# Patient Record
Sex: Female | Born: 1955 | Race: White | Hispanic: No | Marital: Single
Health system: Midwestern US, Community
[De-identification: ages and names within clinical notes are randomized; demographics above are authoritative.]

## PROBLEM LIST (undated history)

## (undated) DIAGNOSIS — K760 Fatty (change of) liver, not elsewhere classified: Secondary | ICD-10-CM

## (undated) DIAGNOSIS — F329 Major depressive disorder, single episode, unspecified: Secondary | ICD-10-CM

## (undated) DIAGNOSIS — F1010 Alcohol abuse, uncomplicated: Secondary | ICD-10-CM

## (undated) DIAGNOSIS — R519 Headache, unspecified: Secondary | ICD-10-CM

## (undated) DIAGNOSIS — F32A Depression, unspecified: Secondary | ICD-10-CM

## (undated) DIAGNOSIS — K047 Periapical abscess without sinus: Secondary | ICD-10-CM

## (undated) HISTORY — PX: TUBAL LIGATION: SHX77

## (undated) HISTORY — PX: DENTAL SURGERY: SHX609

---

## 2006-07-07 ENCOUNTER — Inpatient Hospital Stay (HOSPITAL_COMMUNITY): Admission: EM | Admit: 2006-07-07 | Discharge: 2006-07-08 | Payer: Self-pay | Admitting: Emergency Medicine

## 2006-08-04 ENCOUNTER — Emergency Department (HOSPITAL_COMMUNITY): Admission: EM | Admit: 2006-08-04 | Discharge: 2006-08-04 | Payer: Self-pay | Admitting: Emergency Medicine

## 2006-08-31 ENCOUNTER — Emergency Department (HOSPITAL_COMMUNITY): Admission: EM | Admit: 2006-08-31 | Discharge: 2006-08-31 | Payer: Self-pay | Admitting: Emergency Medicine

## 2006-08-31 IMAGING — CT CT HEAD W/O CM
2 series · 14 of 30 positions shown, 16 images · IV contrast (agent unspecified)
Comparison: None.

CLINICAL DATA: Head numbness.  The patient reports cyst removal from the head.
 HEAD CT WITHOUT CONTRAST:
TECHNIQUE: Contiguous axial images were obtained from the base of the skull through the vertex according to standard protocol without contrast.

[Series 2: head_seq 4.5 h45s st · axial · 0.43mm/px · z∈[-156,-57]mm · 6 of 32 slices shown, 8 images]
[im 5/32  brain]
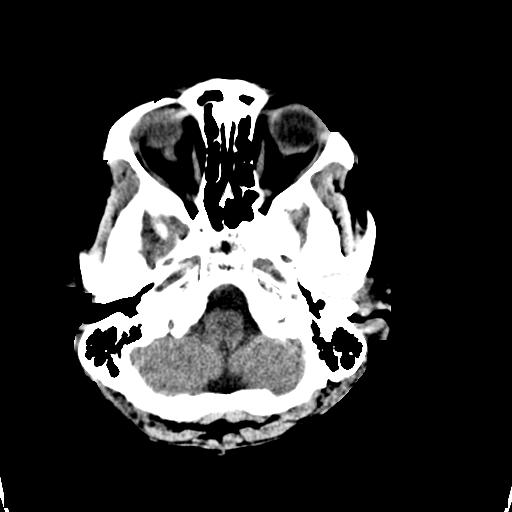
[im 5/32  bone]
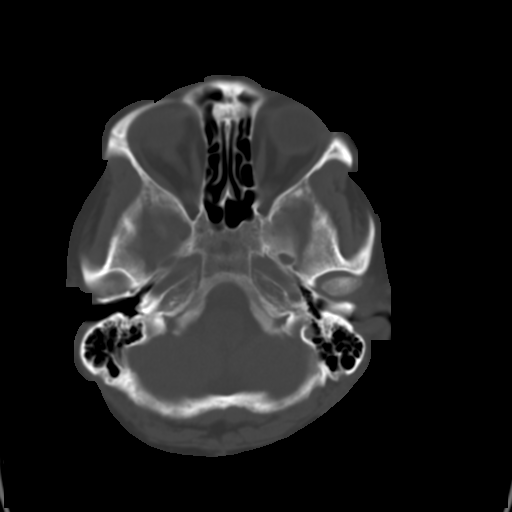
[im 9/32  brain]
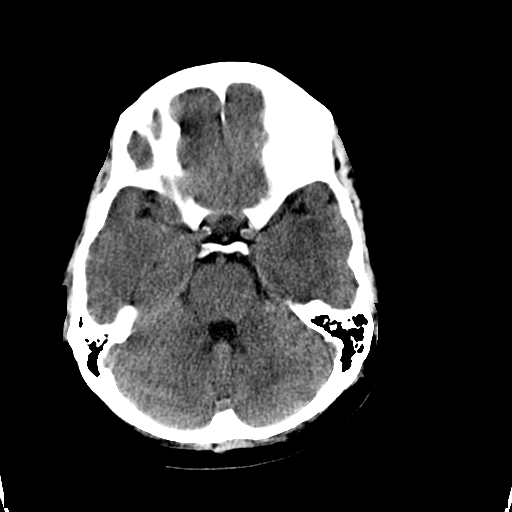
[im 14/32  brain]
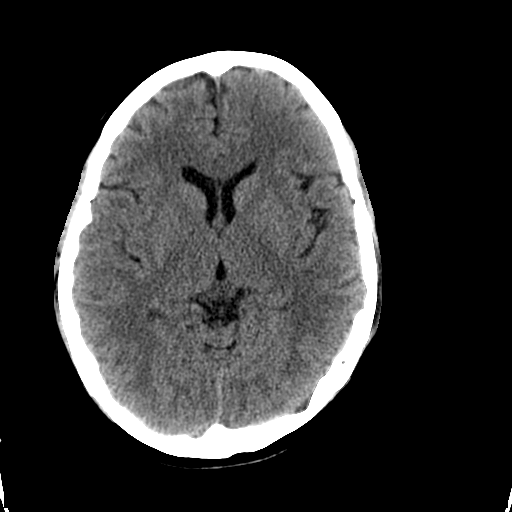
[im 18/32  brain]
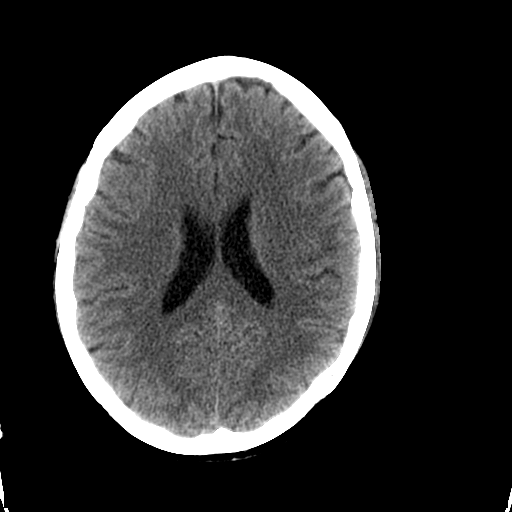
[im 23/32  brain]
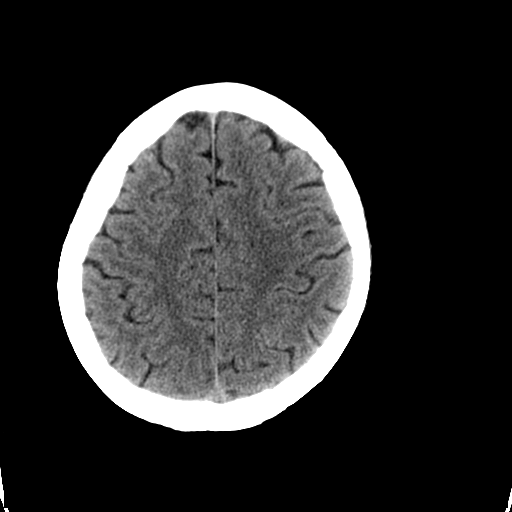
[im 23/32  bone]
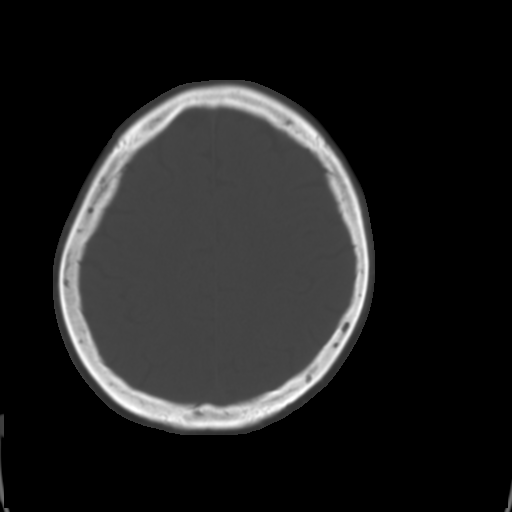
[im 27/32  brain]
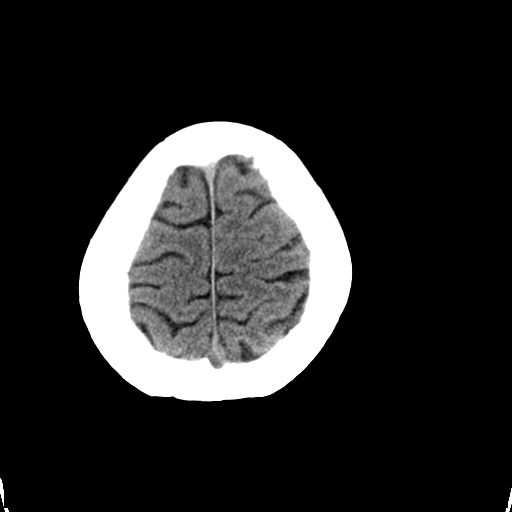

[Series 3: head_seq 1.5 h60s bone · axial · 0.43mm/px · z∈[-162,-48]mm · 8 of 96 slices shown]
[im 10/96  bone]
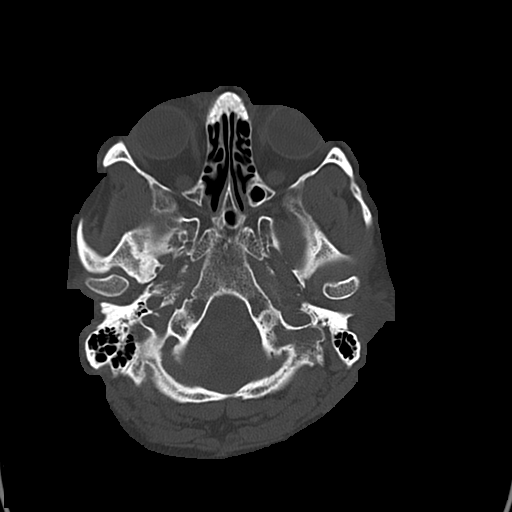
[im 19/96  bone]
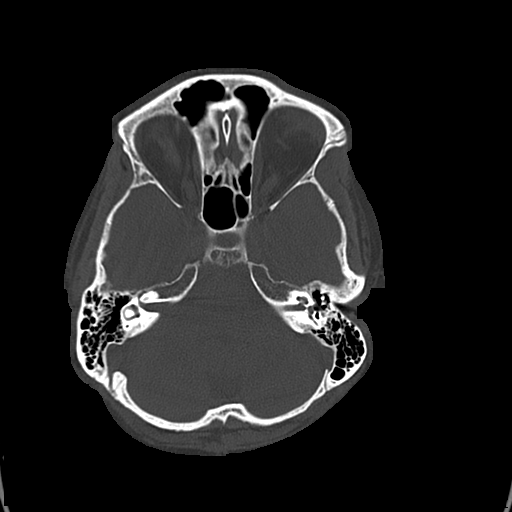
[im 32/96  bone]
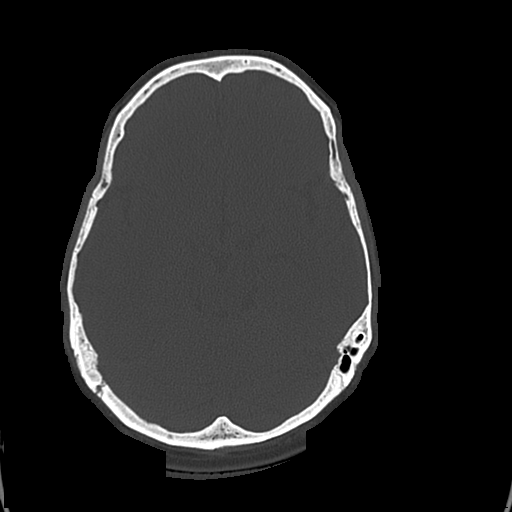
[im 41/96  bone]
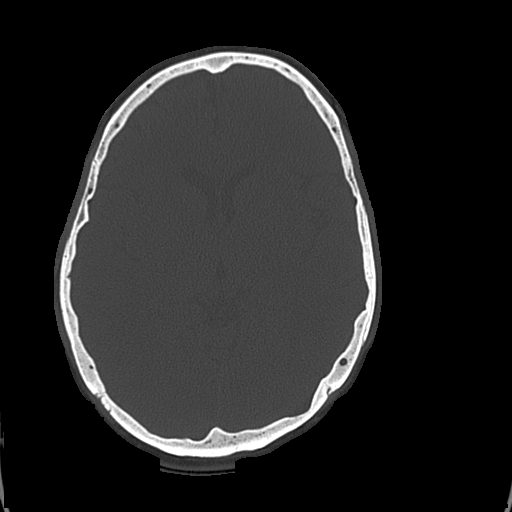
[im 55/96  bone]
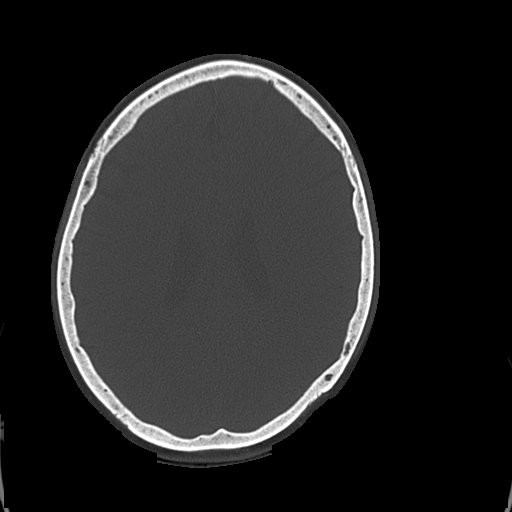
[im 64/96  bone]
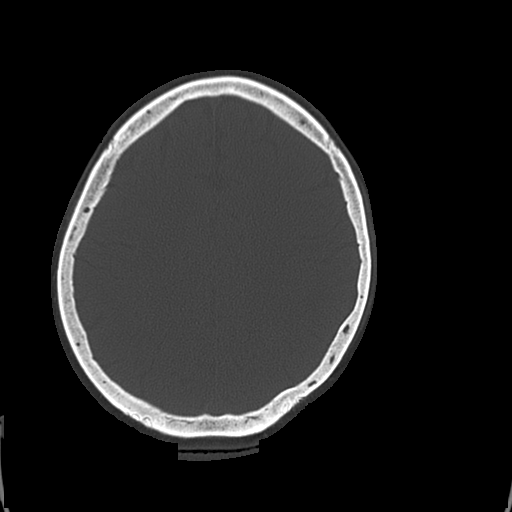
[im 77/96  bone]
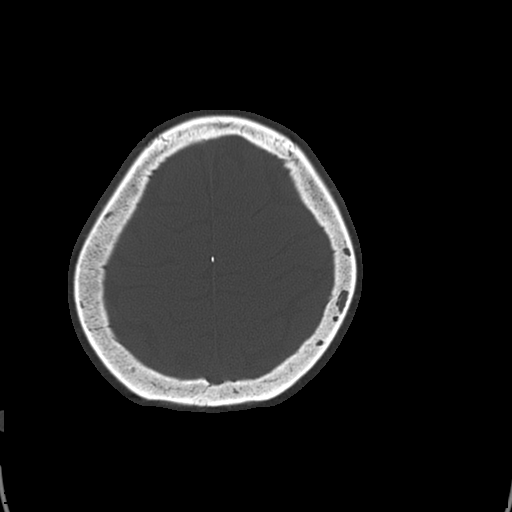
[im 86/96  bone]
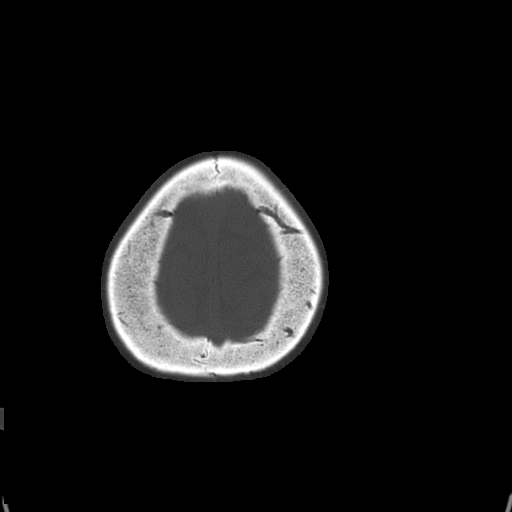

[14 of 30 positions shown; findings below may reference images not displayed]

FINDINGS: There is no evidence of acute intracranial hemorrhage, mass effect or extraaxial fluid collection.  Areas of subcortical low density are present in both parietal lobes, most likely due to chronic small vessel ischemic change.  There is no hydrocephalus.  The visualized paranasal sinuses are clear.  There are no calvarial abnormalities.  A 6 mm nodule in the left frontal scalp on image [DATE] reflect a sebaceous cyst.  No other scalp lesions are identified.
IMPRESSION: 1. No acute intracranial findings. 
 2. Subcortical low density in both parietal lobes is probably due to chronic small vessel ischemic change.  Correlate clinically.

## 2006-11-23 ENCOUNTER — Inpatient Hospital Stay (HOSPITAL_COMMUNITY): Admission: AD | Admit: 2006-11-23 | Discharge: 2006-12-02 | Payer: Self-pay | Admitting: Psychiatry

## 2006-11-23 ENCOUNTER — Ambulatory Visit: Payer: Self-pay | Admitting: Psychiatry

## 2010-01-12 ENCOUNTER — Emergency Department (HOSPITAL_BASED_OUTPATIENT_CLINIC_OR_DEPARTMENT_OTHER): Admission: EM | Admit: 2010-01-12 | Discharge: 2010-01-13 | Payer: Self-pay | Admitting: Emergency Medicine

## 2010-01-13 ENCOUNTER — Ambulatory Visit: Payer: Self-pay | Admitting: Diagnostic Radiology

## 2010-01-13 IMAGING — CR DG CHEST 2V
2 series · 2 of 2 positions shown · non-contrast
Comparison: None.

CLINICAL DATA: Medical clearance.

CHEST - 2 VIEW

[w chest pa]
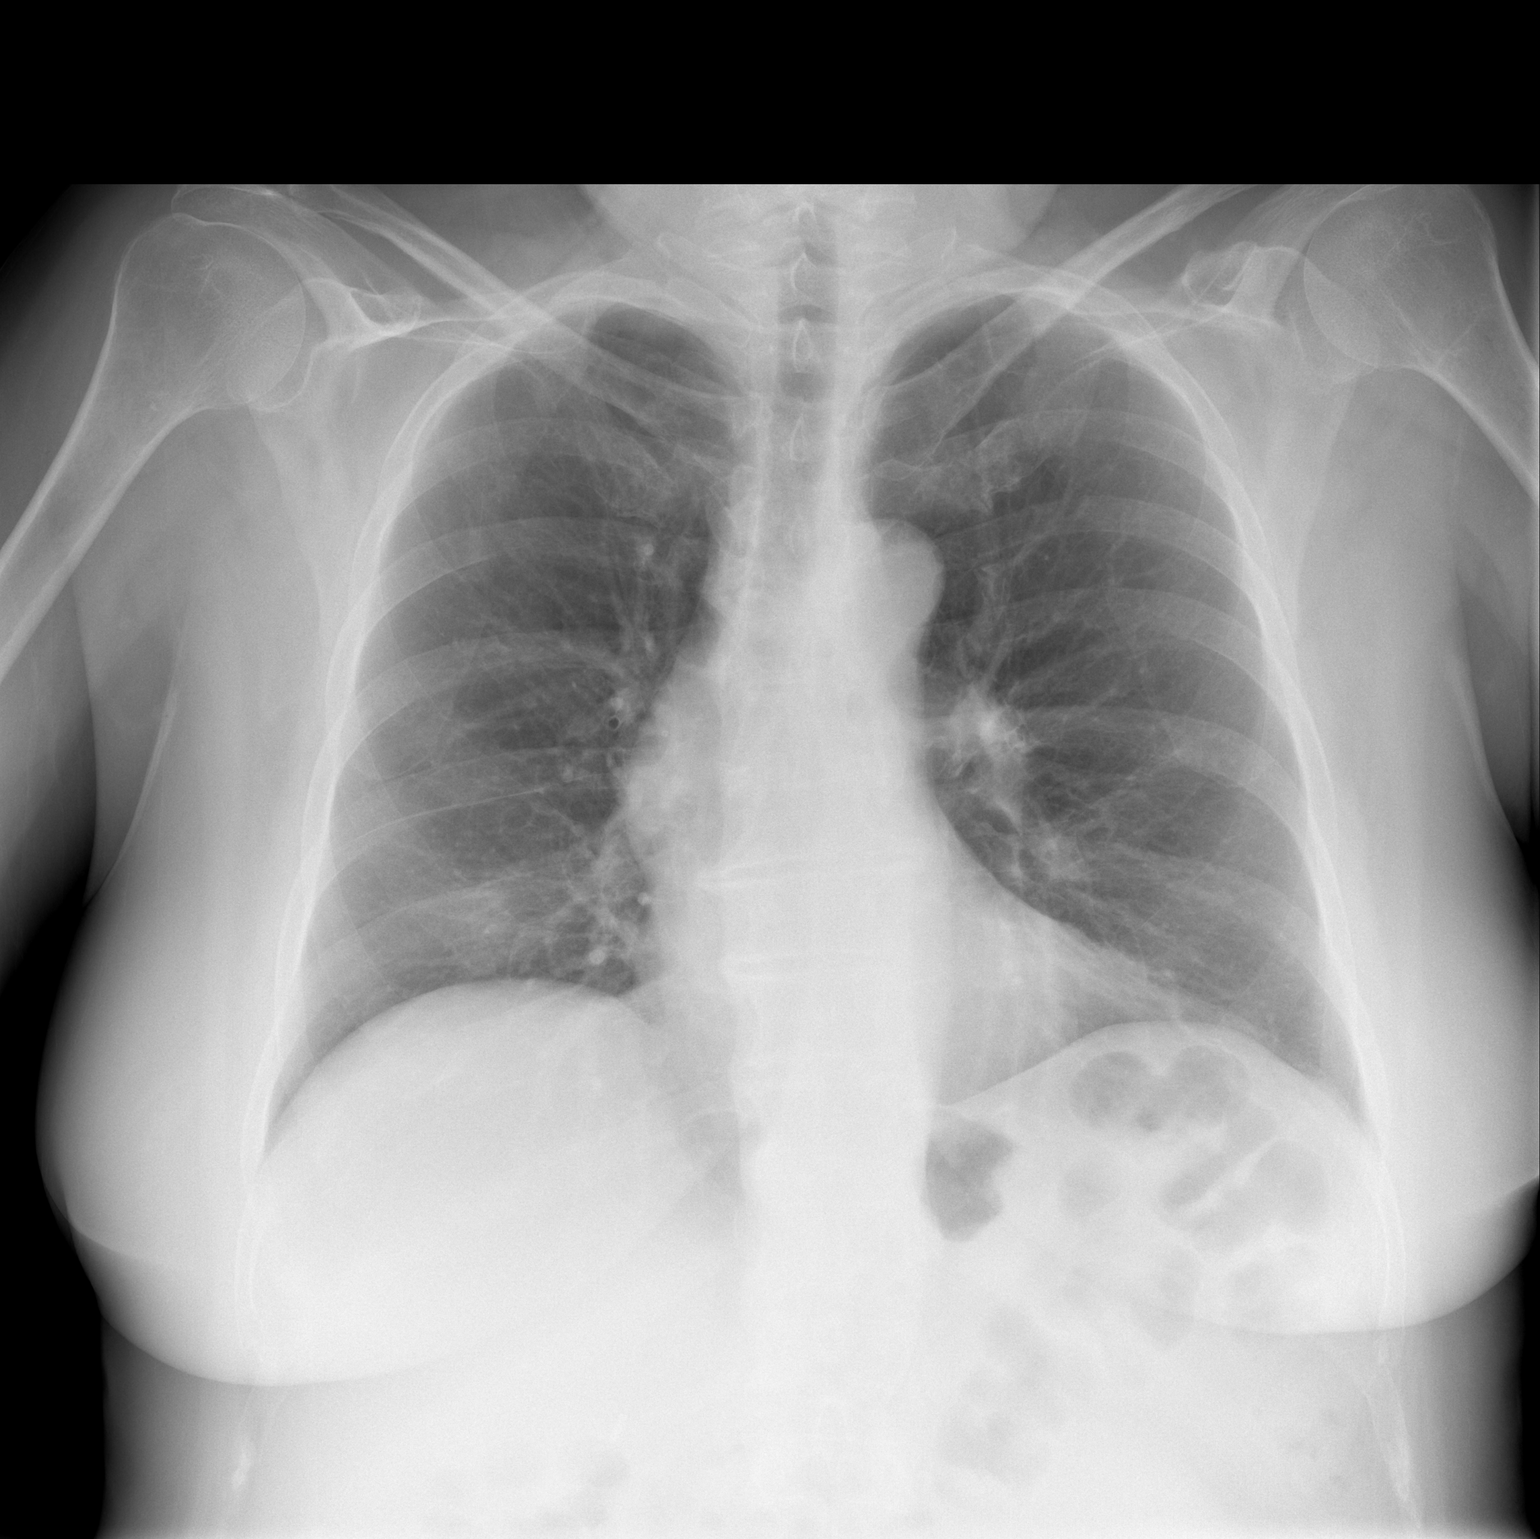

[w chest lat]
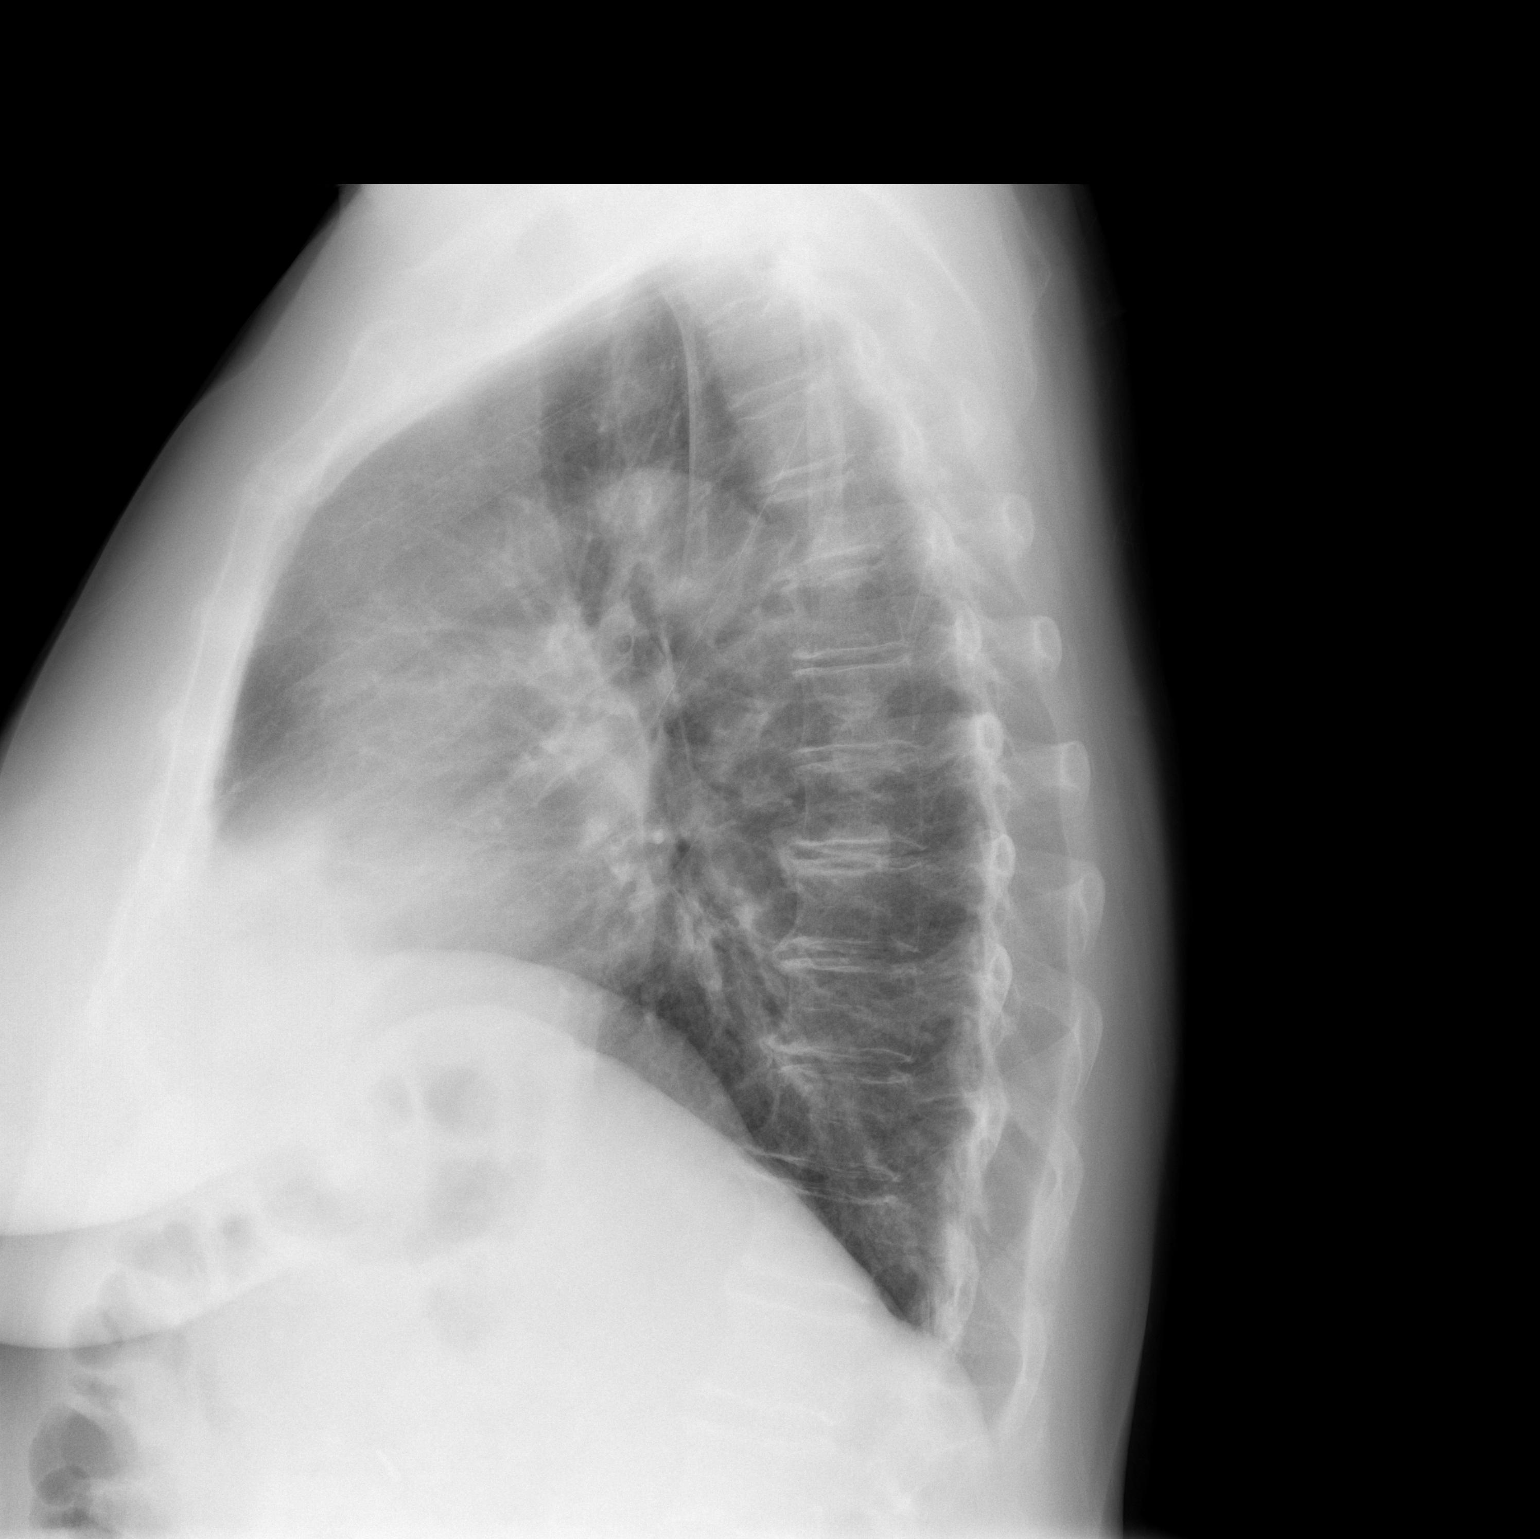

[2 of 2 positions shown; findings below may reference images not displayed]

FINDINGS: Lungs are clear.  Heart size is normal.  No effusion or
focal bony abnormality.
IMPRESSION: Negative chest.

## 2010-01-19 ENCOUNTER — Emergency Department (HOSPITAL_COMMUNITY): Admission: EM | Admit: 2010-01-19 | Discharge: 2010-01-20 | Payer: Self-pay | Admitting: Emergency Medicine

## 2010-01-20 ENCOUNTER — Ambulatory Visit: Payer: Self-pay | Admitting: Psychiatry

## 2010-01-20 ENCOUNTER — Inpatient Hospital Stay (HOSPITAL_COMMUNITY): Admission: RE | Admit: 2010-01-20 | Discharge: 2010-01-25 | Payer: Self-pay | Admitting: Psychiatry

## 2010-09-21 LAB — URINALYSIS, ROUTINE W REFLEX MICROSCOPIC
Ketones, ur: NEGATIVE mg/dL
Nitrite: NEGATIVE
Specific Gravity, Urine: 1.02 (ref 1.005–1.030)
pH: 5.5 (ref 5.0–8.0)

## 2010-09-21 LAB — DIFFERENTIAL
Basophils Absolute: 0 10*3/uL (ref 0.0–0.1)
Basophils Relative: 0 % (ref 0–1)
Eosinophils Absolute: 0.1 10*3/uL (ref 0.0–0.7)
Eosinophils Relative: 1 % (ref 0–5)
Lymphocytes Relative: 14 % (ref 12–46)
Monocytes Absolute: 0.5 10*3/uL (ref 0.1–1.0)
Monocytes Relative: 7 % (ref 3–12)

## 2010-09-21 LAB — POCT I-STAT, CHEM 8
Calcium, Ion: 1.11 mmol/L — ABNORMAL LOW (ref 1.12–1.32)
Chloride: 106 mEq/L (ref 96–112)
Creatinine, Ser: 0.7 mg/dL (ref 0.4–1.2)
HCT: 43 % (ref 36.0–46.0)
Potassium: 3.8 mEq/L (ref 3.5–5.1)
Sodium: 139 mEq/L (ref 135–145)
TCO2: 25 mmol/L (ref 0–100)

## 2010-09-21 LAB — RAPID URINE DRUG SCREEN, HOSP PERFORMED
Barbiturates: NOT DETECTED
Benzodiazepines: NOT DETECTED

## 2010-09-21 LAB — CBC: MCHC: 33.3 g/dL (ref 30.0–36.0)

## 2010-09-21 LAB — TSH: TSH: 1.444 u[IU]/mL (ref 0.350–4.500)

## 2010-09-22 LAB — ACETAMINOPHEN LEVEL: Acetaminophen (Tylenol), Serum: <10 ug/mL — ABNORMAL LOW (ref 10–30)

## 2010-09-22 LAB — URINALYSIS, ROUTINE W REFLEX MICROSCOPIC
Bilirubin Urine: NEGATIVE
Glucose, UA: NEGATIVE mg/dL
Hgb urine dipstick: NEGATIVE
Ketones, ur: NEGATIVE mg/dL
Nitrite: NEGATIVE
Specific Gravity, Urine: 1.016 (ref 1.005–1.030)
pH: 5.5 (ref 5.0–8.0)

## 2010-09-22 LAB — URINE MICROSCOPIC-ADD ON

## 2010-09-22 LAB — POCT TOXICOLOGY PANEL

## 2010-09-22 LAB — COMPREHENSIVE METABOLIC PANEL
Albumin: 3.7 g/dL (ref 3.5–5.2)
Alkaline Phosphatase: 172 U/L — ABNORMAL HIGH (ref 39–117)
CO2: 29 mEq/L (ref 19–32)
GFR calc non Af Amer: >60 mL/min (ref >60)
Potassium: 3.9 mEq/L (ref 3.5–5.1)
Total Bilirubin: 0.6 mg/dL (ref 0.3–1.2)
Total Protein: 7.2 g/dL (ref 6.0–8.3)

## 2010-09-22 LAB — DIFFERENTIAL
Basophils Absolute: 0.1 10*3/uL (ref 0.0–0.1)
Basophils Relative: 1 % (ref 0–1)
Eosinophils Absolute: 0.1 10*3/uL (ref 0.0–0.7)
Lymphocytes Relative: 23 % (ref 12–46)
Lymphs Abs: 1.8 10*3/uL (ref 0.7–4.0)
Neutrophils Relative %: 66 % (ref 43–77)

## 2010-09-22 LAB — URINE CULTURE: Colony Count: 35000

## 2010-09-22 LAB — SALICYLATE LEVEL: Salicylate Lvl: <1 mg/dL — ABNORMAL LOW (ref 2.8–20.0)

## 2010-09-22 LAB — CBC
Platelets: 254 10*3/uL (ref 150–400)
WBC: 7.9 10*3/uL (ref 4.0–10.5)

## 2010-11-19 NOTE — H&P (Signed)
NAMEHILDY, Tracie Atkins NO.:  [id number]   MEDICAL RECORD NO.:  [id number]          PATIENT TYPE:  IPS   LOCATION:  0508                          FACILITY[facility]  [facility]   PHYSICIAN:  Geoffery Lyons, M.D.      DATE OF BIRTH:  Dec 19, 1955   DATE OF ADMISSION:  11/23/2006  DATE OF DISCHARGE:                       PSYCHIATRIC ADMISSION ASSESSMENT   A 55 year old single white female voluntarily admitted Nov 23, 2006.   HISTORY OF PRESENT ILLNESS:  The patient presents with a history of a  self-inflicted injury, cut her left wrist with a razor.  Has also been  using substances, using cocaine and alcohol, drinking beer mainly.  Recently moved from [place] to be near her sons and grandson and  reports that she wished she never did.  She reports that she has been  having auditory hallucinations to do it.  She was currently living  with a friend and apparently had kicked out, which is adding to her  stress.   PAST PSYCHIATRIC HISTORY:  This is the first admission to [facility].  Reports has been detoxed in the past before.   SOCIAL HISTORY:  She is a single white female, has three adult children,  lives with her boyfriend.  She is unemployed.  She is on SSI.  No legal  charges.  Recently moved from [place] to [place] about 6 months  ago.   FAMILY HISTORY:  Denies.   ALCOHOL AND DRUG HISTORY:  Nonsmoker, apparently no IV drug use.  Again,  drinking and using cocaine.   PRIMARY CARE [name]:  None here in this area but reports her family  doctor in [place] is [facility] in [place], [place].   MEDICAL PROBLEMS:  Reports a history of anemia and has had an abnormal  Pap but has not had any follow-up and reports some recent vaginal  bleeding.   MEDICATION:  Has been taking Tylenol PM for sleep.   DRUG ALLERGIES:  No known allergies.   PHYSICAL EXAM:  This is a middle-aged female.  She presents with  superficial lacerations to her left  forearm.  She is currently  complaining of constipation for several days and also reports vaginal  bleeding at this time.  She does not appear to be in any acute distress.  She was assessed at [facility] emergency department.  Her temperature  is 98.3, 82 heart rate, 60 respirations, blood pressure 101/70, 5 feet 2  inches tall, 177 pounds.  Her hemoglobin/hematocrit is 12.9/38.5,  albumin is 3.2.  Alcohol level was 172, salicylate level less than 4.  Urine drug screen was negative.   MENTAL STATUS EXAM:  She is sleepy.  She does awaken for the interview.  She is currently dressed in scrubs.  Fair eye contact.  She appears  tired.  Her speech is soft spoken, mood is depressed.  The patient  appears flat.  __________  are endorsing auditory hallucinations but  does not appear to be actively responding.  Her thought processes are  coherent, goal directed, cognition function intact, memory is  fair,  judgment and insight fair.   AXIS I:  Depressive disorder, not otherwise specified, polysubstance  abuse.  AXIS II:  Deferred.  AXIS III:  Anemia, abnormal Pap, and lacerations to her left wrist.  AXIS IV:  Problems with primary support groups, possible housing issues  other psychosocial problems and medical problems.  AXIS V:  Current is 30.   PLANS:  To contract for safety.  Stabilize mood and thinking.  Will put  patient on Librium protocol.  Work on relapse prevention.  Will consider  an antidepressant.  Will monitor her wounds.  The patient to follow up  with her primary care [name].  Will offer to make an appointment up in  [place] if patient will be returning there.  Case manager is to assess  her rehab situation and living situation and we will also attempt to  contact her family for a family session for support.  Tentative length  stay is 4-6 days.      Landry Corporal, N.P.      Geoffery Lyons, M.D.  Electronically Signed    JO/MEDQ  D:  11/26/2006  T:  11/26/2006  Job:   [id number]

## 2010-11-22 NOTE — Consult Note (Signed)
NAME:  Tracie Atkins, Tracie Atkins NO.:  [id number]   MEDICAL RECORD NO.:  [id number]        PATIENT TYPE:  LINP   LOCATION:                               FACILITY:  [facility]   PHYSICIAN:  Antonietta Breach, M.D.  DATE OF BIRTH:  [birth year]   DATE OF CONSULTATION:  07/07/2006  DATE OF DISCHARGE:                                 CONSULTATION   REASON FOR CONSULTATION:  Overdose.   HISTORY OF PRESENT ILLNESS:  [name] is a 55 year old female  admitted to the [facility] on July 07, 2006, due to  overdose.   The patient denies any thoughts of harming herself.  She does describe  constructive future interests and goals.  She has no hallucinations or  delusions.  She has no depressed mood.   She has recovered her mental status and is oriented to all spheres.  She  is cooperative with medical care.  Her memory function is now intact.   She does acknowledge that she has been abusing substances.  Her urine  drug screen was positive for cocaine.  The patient has been drinking  excessive alcohol.  She has a blackout for overdose.  She cannot  remember taking those substances.  Please see the laboratory data below.   The patient, as is evident by laboratory data, overdosed on Tylenol,  alcohol.  She also had been using cocaine recently, also in the report  of medicines taken.  Excedrin was mentioned as well as Benadryl.   The family reported that the patient had been depressed for 3 days and  took an overdose after a family dispute.   PAST PSYCHIATRIC HISTORY:  The family reported depression.   FAMILY PSYCHIATRIC HISTORY:  None known.   SOCIAL HISTORY:  The patient is single.  She has one son who lives in  the [place], and there is a grandchild as well.  She also has two sons who  live in __________ .  Education:  High school.  Born in [place], [place].  She never married by report.  Occupation:  Disabled.   PAST MEDICAL HISTORY:  Polysubstance overdose status  post delirium.   NO KNOWN DRUG ALLERGIES.   MEDICATIONS:  MAR is reviewed.  The patient is on the Mucomyst protocol.   LABORATORY DATA:  Tylenol 196, alcohol 204.  Urine drug screen positive  for cocaine.  Sodium 141, BUN 10, creatinine 0.5.   REVIEW OF SYSTEMS:  CONSTITUTIONAL:  Afebrile.  No weight loss.  HEAD:  No trauma.  EYES:  No visual changes.  EARS:  No hearing impairment.  NOSE:  No rhinorrhea.  MOUTH/THROAT:  No sore throat.  NEUROLOGICAL:  No  focal motor or sensory changes.  PSYCHIATRIC:  As above.  CARDIOVASCULAR:  No chest pain, palpitations.  RESPIRATORY:  No coughing  or wheezing.  GASTROINTESTINAL:  No nausea, vomiting, diarrhea.  GENITOURINARY:  No dysuria.  SKIN:  Unremarkable.  ENDOCRINE/METABOLIC:  No heat or cold intolerance.  MUSCULOSKELETAL:  No deformities.  HEMATOLOGIC/LYMPHATIC:  Unremarkable.   PHYSICAL EXAMINATION:  VITAL SIGNS:  Temperature 97.1, pulse 101,  respiratory rate 18, blood pressure 107/97, O2 saturation on room air  99%.  GENERAL APPEARANCE:  [name] is a middle-aged female lying in a  partially reclined supine position in her hospital bed with no abnormal  involuntary movements.  OTHER MENTAL STATUS EXAM:  [name] is alert.  She is oriented to all  spheres.  Her attention span is within normal limits.  Her eye contact  is good.  Her concentration is within normal limits.  Her memory is  intact to immediate recent and remote except for the blackout period.  Her fund of knowledge and intelligence are within normal limits.  Speech  involves normal rate and prosody without dysarthria.  Thought process  logical, coherent, goal-directed.  No looseness of association.  Language expression and comprehension are intact.  Abstraction intact.  Thought content:  No thoughts of harming herself, no thoughts of harming  others.  No delusions.  No hallucinations.  Insight is partial.  Judgment is intact.   ASSESSMENT:  AXIS I:  (293.83)   Mood disorder not otherwise specified,  rule out adjustment disorder with mixed disturbance of emotions and  conduct.  Polysubstance dependence.  AXIS II:  Deferred.  AXIS III:  See general medical section.  AXIS IV:  Primary support group.  AXIS V:  55.   [name] is no longer risk to harm herself after recovery from her  intoxicated state.  She does agree to call emergency services  immediately for any thoughts of harming herself, thoughts of harming  others or distress.   Due to the severe nature of the patient's overdose as well as her  substance abuse pattern, the undersigned did recommend admission to a  psychiatric inpatient unit for a dual diagnosis track.  However, the  patient declined, and she is no longer committable after recovering from  her intoxicated state.   The patient requests that she attend an intensive outpatient program for  chemical dependency.   The patient will attend a chemical dependency intensive outpatient  program.  The social worker can help coordinate this for a scheduling of  a program within her area to start upon discharge.  No psychotropic  medications recommended.   DISCUSSION:  [name] history does support that she had a blackout  for the reactive emotional state that occurred.  She was under the  influence of substances which caused anamnestic.  Also, they would have  impaired her judgment and increased her impulsivity so that she would  have reacted with unusual emotion and self-destructive conduct under the  influence of the substance.      Antonietta Breach, M.D.  Electronically Signed    JW/MEDQ  D:  07/04/2007  T:  07/05/2007  Job:  [id number]

## 2010-11-22 NOTE — H&P (Signed)
NAMEBRADIE, Atkins NO.:  [id number]   MEDICAL RECORD NO.:  [id number]          PATIENT TYPE:  INP   LOCATION:  0154                         FACILITY:  [facility]   PHYSICIAN:  Della Goo, M.D. DATE OF BIRTH:  Jan 12, 1956   DATE OF ADMISSION:  07/07/2006  DATE OF DISCHARGE:                              HISTORY & PHYSICAL   CHIEF COMPLAINT:  Overdose.   HISTORY OF PRESENT ILLNESS:  This is a 55 year old female who was  brought to the emergency department at [facility] secondary to  reports of ingesting increased amounts of Tylenol, Excedrin, and  Benadryl, in an attempt to commit suicide.  The history is per family at  bedside secondary to the patient being uncooperative with the interview.  Also, vital facts obtained through the medical records and documents.  The patient also had ingested cocaine and alcohol.  The patient's family  members at bedside report that the patient had taken the medications  after a dispute.  The family member also reports that the patient has  had depression for over the past 3 days.   STAT LABORATORY STUDIES:  Urine drug screen performed revealed a Tylenol  level at 196, an alcohol level at 204, and a positive drug screen for  cocaine.  The [facility] was contacted.  The patient was  administered activated charcoal and given IV Mucomyst therapy.  The  pharmacy was also consulted to continue dosing of the Mucomyst for  treatment secondary to the toxic level of 196.4.   PAST MEDICAL HISTORY:  Per patient is none.   MEDICATIONS:  None.   ALLERGIES:  No known drug allergies.   SOCIAL HISTORY:  Lives with family.  Positive smoker, occasional  drinker.  The patient denies cocaine abuse or illicit drugs; however,  has a positive urine drug screen for cocaine.   REVIEW OF SYSTEMS:  Negative except for symptoms of depressed mood.   PHYSICAL EXAMINATION:  GENERAL:  Obese 55 year old female in acute  distress.  VITAL SIGNS:  Temperature 97.1, blood pressure 107/77, heart rate 101,  respirations 18, oxygen saturations 99 percent on room air.  HEENT:  Normocephalic, atraumatic.  Pupils are equally round, reactive  to light.  Extraocular muscles are intact.  Funduscopic is benign.  Oropharynx with black oral exudate from the charcoal administration.  NECK:  Supple, full range of motion.  No thyromegaly, adenopathy, or  jugular venous distension.  CARDIOVASCULAR:  Mild tachycardia.  No murmurs, gallops, or rubs.  LUNGS:  Clear to auscultation bilaterally.  ABDOMEN:  Positive bowel sounds.  Soft, nontender, nondistended.  EXTREMITIES:  Without edema.  NEUROLOGIC:  The patient is alert and oriented x3.  There are no focal  deficits.   LABORATORY STUDIES:  As mentioned above.  Also, sodium level 141,  potassium 3.2, chloride 106, CO2 25, BUN 10, creatinine 0.5, and glucose  99.   ASSESSMENT:  This is a 55 year old female who took an overdose of  Tylenol in an attempt to commit suicide, along with taking other  substances.   ADMITTING DIAGNOSES:  1. Tylenol overdose/toxic ingestion.  2. Polysubstance abuse.  3. Alcoholism.  4. Hypokalemia.  5. Depression.   PLAN:  The patient will be admitted to a monitored area for cardiac  monitoring.  She will be placed on the suicide precautions protocol with  a one-to-one sitter.  She will be placed on IV fluids and potassium  replacement therapy.  Pharmacy will continue to dose and order the  Mucomyst treatment.  Her Tylenol levels will be monitored per the  protocol.  A psychiatric consultation has been requested with Dr.  Jeanie Sewer.  The patient may possibly be medically cleared in 24 hours.      Della Goo, M.D.  Electronically Signed     HJ/MEDQ  D:  07/07/2006  T:  07/07/2006  Job:  [id number]

## 2010-11-22 NOTE — Discharge Summary (Signed)
NAMEGENE, COLEE NO.:  [id number]   MEDICAL RECORD NO.:  [id number]          PATIENT TYPE:  IPS   LOCATION:  0508                          [facility]ACILITY:  [facility]   PHYSICIAN:  Geoffery Lyons, M.D.      DATE OF BIRTH:  10-06-1955   DATE OF ADMISSION:  11/23/2006  DATE OF DISCHARGE:  12/02/2006                               DISCHARGE SUMMARY   CHIEF COMPLAINT AND PRESENT ILLNESS:  This was the first admission to  [facility] for this 55 year old single white female  voluntarily admitted.  History of a self-inflicted injury, cut her left  wrist with a razor.  Also has been using substances, using cocaine and  alcohol, drinking beer mainly.  Recently moved from [place] to be near  her sons and grandson.  Reported that she wished she never did.  Report  that she had been having auditory hallucinations to do it.  Currently  living with a friend who apparently had kicked her out which is adding  to her stress.   PAST PSYCHIATRIC HISTORY:  First time at [facility].  She had  been detoxed in the past.   ALCOHOL/DRUG HISTORY:  Endorsed that she had been drinking and using  cocaine.   MEDICAL HISTORY:  Anemia.   MEDICATIONS:  Had been taking Tylenol PM for sleep.   PHYSICAL EXAMINATION:  Performed and failed to show any acute findings.  Superficial laceration to her left forearm.   LABORATORY DATA:  Alcohol level 172.  Drug screen was negative.   MENTAL STATUS EXAM:  Female with fair eye contact.  Appears tired.  Speech was soft tone.  Mood depressed.  Affect depressed.  Endorsing  auditory hallucinations.  Thought processes are logical, coherent and  relevant.  No active delusions.  No evidence of active hallucinations.  No active suicidal or homicidal ideation.  Cognition well-preserved.   ADMISSION DIAGNOSES:  AXIS I:  Major depression with psychotic features.  Alcohol and cocaine abuse; rule out dependence.  AXIS II:  No diagnosis.  AXIS  III:  Anemia, status post laceration to her left wrist.  AXIS IV:  Moderate.  AXIS V:  GAF upon admission 30; highest GAF in the last year 60.   HOSPITAL COURSE:  She was admitted, started in individual and group  psychotherapy.  She was given Ambien for sleep, was treated with some  Phenergan.  She was started on Celexa 20 mg per day and she was given  some Seroquel.  Eventually, she was started on Neurontin.  She endorsed  depression.  Wanted to get back to [place].  Came to stay here in  November.  Got out of [place].  Endorsed that her boyfriend hanged  himself.  She wanted to leave.  Tried smoking crack, cut her arm, had  something to drink.  Endorsed crying spells, being irritable, decreased  energy, decreased motivation, started drinking a couple of beers then  got into using crack.  She was admitted in [place] in August of 2007  after she found him hanging.  She endorsed  persistent depression.  Endorsed the depression got her a lot of worry, ruminating as well as  insomnia.  Feeling that she was going to lose it.  Very overwhelmed.  She was very upset because the offer that her children made to her was  that, if she would come to this area, they would take care of her  basically but she says that they have not kept with their promise and  then they told her that they were going to buy her a ticket to go back  to [place] but they, especially her son, have not been true to that  promise.  Still wanted to get back to [place].  Did not see herself  making it in this area.  Very depressed.  Getting herself to think and  worry.  In the next couple of days, we continued to work with her.  We  continued to work with the Seroquel, the Celexa.  Eventually, added  Neurontin 300 mg four times a day.  On Nov 30, 2006, was still endorsing  depressed mood, having a hard time, dealing with the situation with her  children.  As the day goes by and they are not there for her, she feels  more  hopeless, helpless.  We increased the Celexa to 40 mg.  We  continued to work on trying to see if we could facilitate her getting to  [place].  There was some grieving going on due to the fact that she  felt she was losing this relationship with her children as she felt they  had turned against her.  On Dec 02, 2006, she was in full contact with  reality.  She endorsed she was better.  Was going to be leaving and go  back to [place].  Dealing with the pain of having to leave her  granddaughter who is 59 years old but, at the same time, felt that she  needed to do this as her son has not been supportive.  Upon admission,  in full contact with reality, denies any active suicidal or homicidal  ideation.  No hallucinations.  No delusions.  Acute withdrawal.  Will  discharge to be discharged to go back to [place].   DISCHARGE DIAGNOSES:  AXIS I:  Major depression with psychotic features.  Cocaine and alcohol abuse.  AXIS II:  No diagnosis.  AXIS III:  Anemia, status post laceration to her left wrist.  AXIS IV:  Moderate.  AXIS V:  GAF upon discharge 55.   DISCHARGE MEDICATIONS:  1. Seroquel 25 mg, 1 twice a day.  2. Seroquel 100 mg at bedtime.  3. Neurontin 300 mg four times a day.  4. Celexa 40 mg per day.  5. Ambien 10 mg at bedtime for sleep.   FOLLOWUP:  To be arranged by the patient once she gets to [place], [place].      Geoffery Lyons, M.D.  Electronically Signed     IL/MEDQ  D:  12/22/2006  T:  12/23/2006  Job:  [id number]

## 2015-03-03 ENCOUNTER — Inpatient Hospital Stay (HOSPITAL_COMMUNITY)
Admission: AD | Admit: 2015-03-03 | Discharge: 2015-03-03 | Disposition: A | Discharge status: Home / Self Care | Payer: Medicare (Managed Care) | Source: Ambulatory Visit | Attending: Obstetrics and Gynecology | Admitting: Obstetrics and Gynecology

## 2015-03-03 ENCOUNTER — Encounter (HOSPITAL_COMMUNITY): Payer: Self-pay | Admitting: *Deleted

## 2015-03-03 DIAGNOSIS — N390 Urinary tract infection, site not specified: Secondary | ICD-10-CM | POA: Diagnosis not present

## 2015-03-03 DIAGNOSIS — N898 Other specified noninflammatory disorders of vagina: Secondary | ICD-10-CM | POA: Insufficient documentation

## 2015-03-03 DIAGNOSIS — N3000 Acute cystitis without hematuria: Secondary | ICD-10-CM | POA: Diagnosis not present

## 2015-03-03 DIAGNOSIS — K5900 Constipation, unspecified: Secondary | ICD-10-CM | POA: Insufficient documentation

## 2015-03-03 DIAGNOSIS — F329 Major depressive disorder, single episode, unspecified: Secondary | ICD-10-CM | POA: Insufficient documentation

## 2015-03-03 DIAGNOSIS — N816 Rectocele: Secondary | ICD-10-CM | POA: Diagnosis not present

## 2015-03-03 HISTORY — DX: Major depressive disorder, single episode, unspecified: F32.9

## 2015-03-03 HISTORY — DX: Depression, unspecified: F32.A

## 2015-03-03 LAB — URINE MICROSCOPIC-ADD ON

## 2015-03-03 LAB — URINALYSIS, ROUTINE W REFLEX MICROSCOPIC
BILIRUBIN URINE: NEGATIVE
Glucose, UA: NEGATIVE mg/dL
HGB URINE DIPSTICK: NEGATIVE
Ketones, ur: NEGATIVE mg/dL
NITRITE: POSITIVE — AB
PH: 6 (ref 5.0–8.0)
Protein, ur: NEGATIVE mg/dL
Urobilinogen, UA: 0.2 mg/dL (ref 0.0–1.0)

## 2015-03-03 LAB — WET PREP, GENITAL
CLUE CELLS WET PREP: NONE SEEN
TRICH WET PREP: NONE SEEN
Yeast Wet Prep HPF POC: NONE SEEN

## 2015-03-03 LAB — POCT PREGNANCY, URINE: PREG TEST UR: NEGATIVE

## 2015-03-03 LAB — GLUCOSE, CAPILLARY: Glucose-Capillary: 137 mg/dL — ABNORMAL HIGH (ref 65–99)

## 2015-03-03 MED ORDER — NYSTATIN-TRIAMCINOLONE 100000-0.1 UNIT/GM-% EX CREA
TOPICAL_CREAM | Freq: Once | CUTANEOUS | Status: AC
  Administered 2015-03-03: 14:00:00 via TOPICAL
  Filled 2015-03-03: qty 15

## 2015-03-03 MED ORDER — CEPHALEXIN 500 MG PO CAPS: 500.0000 mg | ORAL_CAPSULE | Freq: Four times a day (QID) | ORAL | Status: DC

## 2015-03-03 MED ORDER — FLUCONAZOLE 150 MG PO TABS
150.0000 mg | ORAL_TABLET | Freq: Once | ORAL | Status: AC
  Administered 2015-03-03: 150 mg via ORAL
  Filled 2015-03-03: qty 1

## 2015-03-03 NOTE — MAU Note (Signed)
Pt presents to MAU with complaints of urine frequency with foul smell to urine. Pt denies any vaginal bleeding

## 2015-03-03 NOTE — MAU Provider Note (Signed)
History     CSN: [id number]  Arrival date and time: 03/03/15 1234   First Provider Initiated Contact with Patient 03/03/15 1327      Chief Complaint  Patient presents with  . Urinary Frequency   HPI Pt is not pregnant 59 yo PM female who presents with vaginal irritation Pt denies vaginal discharge or spotting or bleeding.   Pt c/o vaginal odor.  Pt states she had a bucket beside her bed that she uses to void in and forgot she  Had Pinesol cleaning solution in it and sat in it. Pt also c/o of constipation- has difficulty evacuating bowel Pt is not sexually active at this time except with herself and denies any pain or burning Pt is here temporarily from [place]- with son who lives here and is diabetic Pt was hospitalized in [place] for suicidal attempt- was released in July to be with son here  Pt was told she had some kind of infection and said they were sending prescription to pharmacy but was there- Pt is not sure what king of infection and thinks it might be a UTI Pt denies any pain or burning or frequency of urination, just external itching  MAU Note 03/03/2015 1:12 PM    Expand All Collapse All   Pt presents to MAU with complaints of urine frequency with foul smell to urine. Pt denies any vaginal bleeding       Past Medical History  Diagnosis Date  . Depression     Past Surgical History  Procedure Laterality Date  . Tubal ligation      History reviewed. No pertinent family history.  Social History  Substance Use Topics  . Smoking status: Never Smoker   . Smokeless tobacco: Never Used  . Alcohol Use: No    Allergies: No Known Allergies  Prescriptions prior to admission  Medication Sig Dispense Refill Last Dose  . citalopram (CELEXA) 20 MG tablet Take 20 mg by mouth daily.   03/02/2015 at Unknown time  . QUEtiapine (SEROQUEL) 50 MG tablet Take 50 mg by mouth at bedtime.   Past Week at Unknown time  . traZODone (DESYREL) 150 MG tablet Take 50 mg by mouth at bedtime.    Past Week at Unknown time    Review of Systems  Constitutional: Negative for fever and chills.  Gastrointestinal: Positive for constipation. Negative for nausea, vomiting, abdominal pain and diarrhea.  Genitourinary: Negative for dysuria, urgency and frequency.  Psychiatric/Behavioral: Positive for depression.       Was in hospital for suicidal attempt in [place] in July- came to be with brother- stable now   Physical Exam   Blood pressure 139/67, pulse 74, temperature 98.3 F (36.8 C), resp. rate 16.  Physical Exam  Nursing note and vitals reviewed. Constitutional: She is oriented to person, place, and time. She appears well-developed and well-nourished. No distress.  HENT:  Head: Normocephalic.  Eyes: Pupils are equal, round, and reactive to light.  Neck: Normal range of motion. Neck supple.  Cardiovascular: Normal rate.   Respiratory: Effort normal.  GI: Soft. She exhibits no distension. There is no tenderness. There is no guarding.  Genitourinary:  External exam without reddness or lesions; Atrophic vaginal mucosa with prominent rectocele noted; cervix clean, NT; uterus and adnexa without palpable enlargement or tenderness  Musculoskeletal: Normal range of motion.  Neurological: She is alert and oriented to person, place, and time.  Skin: Skin is warm and dry.  Psychiatric: She has a normal mood and affect.  MAU Course  Procedures Diflucan 150 mg in MAU Nystatin/Mycolog cream in MAU for external irritation Results for orders placed or performed during the hospital encounter of 03/03/15 (from the past 24 hour(s))  Urinalysis, Routine w reflex microscopic (not at [facility])     Status: Abnormal   Collection Time: 03/03/15  1:26 PM  Result Value Ref Range   Color, Urine YELLOW YELLOW   APPearance HAZY (A) CLEAR   Specific Gravity, Urine >1.030 (H) 1.005 - 1.030   pH 6.0 5.0 - 8.0   Glucose, UA NEGATIVE NEGATIVE mg/dL   Hgb urine dipstick NEGATIVE NEGATIVE   Bilirubin Urine  NEGATIVE NEGATIVE   Ketones, ur NEGATIVE NEGATIVE mg/dL   Protein, ur NEGATIVE NEGATIVE mg/dL   Urobilinogen, UA 0.2 0.0 - 1.0 mg/dL   Nitrite POSITIVE (A) NEGATIVE   Leukocytes, UA TRACE (A) NEGATIVE  Urine microscopic-add on     Status: Abnormal   Collection Time: 03/03/15  1:26 PM  Result Value Ref Range   Squamous Epithelial / LPF FEW (A) RARE   WBC, UA 7-10 <3 WBC/hpf   Bacteria, UA MANY (A) RARE  Glucose, capillary     Status: Abnormal   Collection Time: 03/03/15  1:37 PM  Result Value Ref Range   Glucose-Capillary 137 (H) 65 - 99 mg/dL  Pregnancy, urine POC     Status: None   Collection Time: 03/03/15  1:55 PM  Result Value Ref Range   Preg Test, Ur NEGATIVE NEGATIVE  Wet prep, genital     Status: Abnormal   Collection Time: 03/03/15  1:56 PM  Result Value Ref Range   Yeast Wet Prep HPF POC NONE SEEN NONE SEEN   Trich, Wet Prep NONE SEEN NONE SEEN   Clue Cells Wet Prep HPF POC NONE SEEN NONE SEEN   WBC, Wet Prep HPF POC FEW (A) NONE SEEN  GC/chlamydia pending  Assessment and Plan  Vaginal irritation- continue Nystatin/Mycolog cream Rectocele with constipation- stool softener [facility] [facility] for continuing care Depression on psychiatric meds- needs refill soon-recommend f/u with [facility] UTI- Keflex 500mg  QID for 10 days Urine culture pending Tracie Atkins 03/03/2015, 1:49 PM

## 2015-03-03 NOTE — Discharge Instructions (Signed)

## 2015-03-05 LAB — GC/CHLAMYDIA PROBE AMP (~~LOC~~) NOT AT ARMC
Chlamydia: NEGATIVE
Neisseria Gonorrhea: NEGATIVE

## 2015-03-05 LAB — URINE CULTURE

## 2016-05-17 ENCOUNTER — Encounter (HOSPITAL_COMMUNITY): Payer: Self-pay | Admitting: Nurse Practitioner

## 2016-05-17 ENCOUNTER — Emergency Department (HOSPITAL_COMMUNITY): Payer: Medicare (Managed Care)

## 2016-05-17 ENCOUNTER — Emergency Department (HOSPITAL_COMMUNITY)
Admission: EM | Admit: 2016-05-17 | Discharge: 2016-05-17 | Disposition: A | Discharge status: Home / Self Care | Payer: Medicare (Managed Care) | Attending: Emergency Medicine | Admitting: Emergency Medicine

## 2016-05-17 DIAGNOSIS — B349 Viral infection, unspecified: Secondary | ICD-10-CM | POA: Diagnosis not present

## 2016-05-17 DIAGNOSIS — R0981 Nasal congestion: Secondary | ICD-10-CM | POA: Diagnosis present

## 2016-05-17 LAB — COMPREHENSIVE METABOLIC PANEL
ALT: 15 U/L (ref 14–54)
ANION GAP: 9 (ref 5–15)
AST: 17 U/L (ref 15–41)
Albumin: 3.6 g/dL (ref 3.5–5.0)
Alkaline Phosphatase: 121 U/L (ref 38–126)
BILIRUBIN TOTAL: 0.6 mg/dL (ref 0.3–1.2)
BUN: 13 mg/dL (ref 6–20)
CHLORIDE: 105 mmol/L (ref 101–111)
CO2: 25 mmol/L (ref 22–32)
Calcium: 9.4 mg/dL (ref 8.9–10.3)
Creatinine, Ser: 0.63 mg/dL (ref 0.44–1.00)
Glucose, Bld: 83 mg/dL (ref 65–99)
POTASSIUM: 3.4 mmol/L — AB (ref 3.5–5.1)
Sodium: 139 mmol/L (ref 135–145)
TOTAL PROTEIN: 6.4 g/dL — AB (ref 6.5–8.1)

## 2016-05-17 LAB — URINALYSIS, ROUTINE W REFLEX MICROSCOPIC
Bilirubin Urine: NEGATIVE
Glucose, UA: NEGATIVE mg/dL
Hgb urine dipstick: NEGATIVE
KETONES UR: NEGATIVE mg/dL
NITRITE: NEGATIVE
PH: 6.5 (ref 5.0–8.0)
Protein, ur: NEGATIVE mg/dL
Specific Gravity, Urine: 1.016 (ref 1.005–1.030)

## 2016-05-17 LAB — URINE MICROSCOPIC-ADD ON
Bacteria, UA: NONE SEEN
RBC / HPF: NONE SEEN RBC/hpf (ref 0–5)

## 2016-05-17 LAB — CBC
HEMATOCRIT: 42 % (ref 36.0–46.0)
HEMOGLOBIN: 13.8 g/dL (ref 12.0–15.0)
MCH: 29.2 pg (ref 26.0–34.0)
MCHC: 32.9 g/dL (ref 30.0–36.0)
MCV: 89 fL (ref 78.0–100.0)
Platelets: 263 10*3/uL (ref 150–400)
RBC: 4.72 MIL/uL (ref 3.87–5.11)
RDW: 12.7 % (ref 11.5–15.5)
WBC: 9.2 10*3/uL (ref 4.0–10.5)

## 2016-05-17 LAB — LIPASE, BLOOD: Lipase: 24 U/L (ref 11–51)

## 2016-05-17 MED ORDER — ONDANSETRON HCL 4 MG PO TABS: 4.0000 mg | ORAL_TABLET | Freq: Three times a day (TID) | ORAL | 0 refills | Status: DC | PRN

## 2016-05-17 MED ORDER — KETOROLAC TROMETHAMINE 30 MG/ML IJ SOLN
30.0000 mg | Freq: Once | INTRAMUSCULAR | Status: AC
  Administered 2016-05-17: 30 mg via INTRAVENOUS
  Filled 2016-05-17: qty 1

## 2016-05-17 MED ORDER — IPRATROPIUM-ALBUTEROL 0.5-2.5 (3) MG/3ML IN SOLN
3.0000 mL | Freq: Once | RESPIRATORY_TRACT | Status: AC
  Administered 2016-05-17: 3 mL via RESPIRATORY_TRACT
  Filled 2016-05-17: qty 3

## 2016-05-17 MED ORDER — PROMETHAZINE HCL 25 MG PO TABS: 25.0000 mg | ORAL_TABLET | Freq: Four times a day (QID) | ORAL | 0 refills | Status: DC | PRN

## 2016-05-17 MED ORDER — SODIUM CHLORIDE 0.9 % IV BOLUS (SEPSIS)
1000.0000 mL | Freq: Once | INTRAVENOUS | Status: AC
  Administered 2016-05-17: 1000 mL via INTRAVENOUS

## 2016-05-17 MED ORDER — FAMOTIDINE 20 MG PO TABS: 20.0000 mg | ORAL_TABLET | Freq: Two times a day (BID) | ORAL | 0 refills | Status: DC

## 2016-05-17 MED ORDER — FAMOTIDINE IN NACL 20-0.9 MG/50ML-% IV SOLN
20.0000 mg | Freq: Once | INTRAVENOUS | Status: AC
  Administered 2016-05-17: 20 mg via INTRAVENOUS
  Filled 2016-05-17: qty 50

## 2016-05-17 MED ORDER — ONDANSETRON HCL 4 MG/2ML IJ SOLN
4.0000 mg | Freq: Once | INTRAMUSCULAR | Status: AC
  Administered 2016-05-17: 4 mg via INTRAVENOUS
  Filled 2016-05-17: qty 2

## 2016-05-17 MED ORDER — HYDROCODONE-HOMATROPINE 5-1.5 MG/5ML PO SYRP: 5.0000 mL | ORAL_SOLUTION | Freq: Four times a day (QID) | ORAL | 0 refills | Status: DC | PRN

## 2016-05-17 MED ORDER — ALBUTEROL SULFATE HFA 108 (90 BASE) MCG/ACT IN AERS
2.0000 | INHALATION_SPRAY | Freq: Once | RESPIRATORY_TRACT | Status: AC
  Administered 2016-05-17: 2 via RESPIRATORY_TRACT
  Filled 2016-05-17: qty 6.7

## 2016-05-17 MED ORDER — PANTOPRAZOLE SODIUM 40 MG PO TBEC: 40.0000 mg | DELAYED_RELEASE_TABLET | Freq: Once | ORAL | Status: DC

## 2016-05-17 MED ORDER — IBUPROFEN 800 MG PO TABS: 800.0000 mg | ORAL_TABLET | Freq: Three times a day (TID) | ORAL | 0 refills | Status: DC | PRN

## 2016-05-17 NOTE — ED Triage Notes (Signed)
Pt presents with c/o flu-like illness. Her symptoms began about a week ago. She reports she began with a cough and malaise which has progressed to sweats, body aches, vomiting, diarrhea. She noticed some blood in her vomit. She tried tylenol with no relief. She Is alert and breathing easily.

## 2016-05-17 NOTE — ED Provider Notes (Signed)
[facility] DEPT Provider Note   CSN: [id number] Arrival date & time: 05/17/16  1637     History   Chief Complaint Chief Complaint  Patient presents with  . Fatigue    HPI Tracie Atkins is a 60 y.o. female.  HPI   Patient presents with 1 week of nasal congestion, cough, SOB, generalized fatigue and 1 day of vomiting and diarrhea, upper abdominal pain.  States she has had bleeding from her nose.  Has cough productive of black sputum.  Emesis reportedly red today.  Has had decreased urinary output.  Reports she is eating and drinking well.  Denies any fevers.  Lives in [place], came to [place] by private vehicle Nov 1.  No known sick contacts.    Past Medical History:  Diagnosis Date  . Depression     There are no active problems to display for this patient.   Past Surgical History:  Procedure Laterality Date  . TUBAL LIGATION      OB History    Gravida Para Term Preterm AB Living   3 3 3     3    SAB TAB Ectopic Multiple Live Births                   Home Medications    Prior to Admission medications   Medication Sig Start Date End Date Taking? Authorizing Provider  cephALEXin (KEFLEX) 500 MG capsule Take 1 capsule (500 mg total) by mouth 4 (four) times daily. 03/03/15   Kreston Ahrendt Pugh, NP  citalopram (CELEXA) 20 MG tablet Take 20 mg by mouth daily.    Historical Provider, MD  famotidine (PEPCID) 20 MG tablet Take 1 tablet (20 mg total) by mouth 2 (two) times daily. 05/17/16   Clayton Bibles, PA-C  HYDROcodone-homatropine [facility]) 5-1.5 MG/5ML syrup Take 5 mLs by mouth every 6 (six) hours as needed for cough (and pain). 05/17/16   Clayton Bibles, PA-C  ibuprofen (ADVIL,MOTRIN) 800 MG tablet Take 1 tablet (800 mg total) by mouth every 8 (eight) hours as needed for mild pain or moderate pain. 05/17/16   Clayton Bibles, PA-C  ondansetron (ZOFRAN) 4 MG tablet Take 1 tablet (4 mg total) by mouth every 8 (eight) hours as needed for nausea or vomiting. 05/17/16   Clayton Bibles,  PA-C  QUEtiapine (SEROQUEL) 50 MG tablet Take 50 mg by mouth at bedtime.    Historical Provider, MD  traZODone (DESYREL) 150 MG tablet Take 50 mg by mouth at bedtime.    Historical Provider, MD    Family History History reviewed. No pertinent family history.  Social History Social History  Substance Use Topics  . Smoking status: Never Smoker  . Smokeless tobacco: Never Used  . Alcohol use No     Allergies   Patient has no known allergies.   Review of Systems Review of Systems  All other systems reviewed and are negative.    Physical Exam Updated Vital Signs BP 153/94 (BP Location: Right Arm)   Pulse 92   Temp 98.2 F (36.8 C) (Oral)   Resp 18   Ht 5\' 4"  (1.626 m)   Wt 72.2 kg   SpO2 100%   BMI 27.31 kg/m   Physical Exam  Constitutional: She appears well-developed and well-nourished. No distress.  HENT:  Head: Normocephalic and atraumatic.  Nose: Rhinorrhea present.  Mouth/Throat: Oropharynx is clear and moist. No oropharyngeal exudate.  Eyes: Conjunctivae are normal.  Neck: Neck supple.  Cardiovascular: Normal rate and regular rhythm.  Pulmonary/Chest: Effort normal and breath sounds normal. No respiratory distress. She has no wheezes. She has no rales.  Coughing   Abdominal: Soft. She exhibits no distension. There is no tenderness. There is no rebound and no guarding.  Neurological: She is alert.  Skin: She is not diaphoretic.  Nursing note and vitals reviewed.    ED Treatments / Results  Labs (all labs ordered are listed, but only abnormal results are displayed) Labs Reviewed  COMPREHENSIVE METABOLIC PANEL - Abnormal; Notable for the following:       Result Value   Potassium 3.4 (*)    Total Protein 6.4 (*)    All other components within normal limits  CBC  LIPASE, BLOOD  URINALYSIS, ROUTINE W REFLEX MICROSCOPIC (NOT AT [facility])    EKG  EKG Interpretation None       Radiology Dg Chest 2 View  Result Date: 05/17/2016 CLINICAL DATA:   Flu like illness, symptoms beginning 1 week ago with cough and malaise. EXAM: CHEST  2 VIEW COMPARISON:  01/13/2010 FINDINGS: There is a small hiatal hernia projecting over the normal sized cardiac silhouette. The thoracic aorta is slightly ectatic and atherosclerotic without aneurysm. Slight increase in interstitial prominence is noted since prior exam without pneumonic consolidation, effusion or pneumothorax. No suspicious osseous lesions are identified. IMPRESSION: Slight increase in interstitial prominence possibly representing bronchitic change. Small hiatal hernia. Electronically Signed   By: Ashley Royalty M.D.   On: 05/17/2016 18:15    Procedures Procedures (including critical care time)  Medications Ordered in ED Medications  sodium chloride 0.9 % bolus 1,000 mL (1,000 mLs Intravenous New Bag/Given 05/17/16 1840)  ondansetron (ZOFRAN) injection 4 mg (4 mg Intravenous Given 05/17/16 1840)  ketorolac (TORADOL) 30 MG/ML injection 30 mg (30 mg Intravenous Given 05/17/16 1841)  ipratropium-albuterol (DUONEB) 0.5-2.5 (3) MG/3ML nebulizer solution 3 mL (3 mLs Nebulization Given 05/17/16 1820)  famotidine (PEPCID) IVPB 20 mg premix (20 mg Intravenous New Bag/Given 05/17/16 1840)     Initial Impression / Assessment and Plan / ED Course  I have reviewed the triage vital signs and the nursing notes.  Pertinent labs & imaging results that were available during my care of the patient were reviewed by me and considered in my medical decision making (see chart for details).  Clinical Course     Afebrile patient who appears ill but not toxic p/w 1 week of nasal congestion, cough, and now vomiting and diarrhea.  Appears tired.  Labs unremarkable.  CXR with bronchitic change.  UA does not appear infected.  IVF, zofran, toradol, albuterol/atrovent nebs, protonix with improvement.  Tolerating PO.  No vomiting or diarrhea in ED.  D/C home with symptomatic medications.     Discussed result, findings,  treatment, and follow up  with patient.  Pt given return precautions.  Pt verbalizes understanding and agrees with plan.      Final Clinical Impressions(s) / ED Diagnoses   Final diagnoses:  Acute viral syndrome    New Prescriptions New Prescriptions   FAMOTIDINE (PEPCID) 20 MG TABLET    Take 1 tablet (20 mg total) by mouth 2 (two) times daily.   HYDROCODONE-HOMATROPINE (HYCODAN) 5-1.5 MG/5ML SYRUP    Take 5 mLs by mouth every 6 (six) hours as needed for cough (and pain).   IBUPROFEN (ADVIL,MOTRIN) 800 MG TABLET    Take 1 tablet (800 mg total) by mouth every 8 (eight) hours as needed for mild pain or moderate pain.   ONDANSETRON (ZOFRAN) 4 MG TABLET  Take 1 tablet (4 mg total) by mouth every 8 (eight) hours as needed for nausea or vomiting.     Clayton Bibles, PA-C 05/17/16 1954    Leonard Schwartz, MD 05/18/16 [phone]

## 2016-05-17 NOTE — Discharge Instructions (Signed)
Read the information below.  Use the prescribed medication as directed.  Please discuss all new medications with your pharmacist.  You may return to the Emergency Department at any time for worsening condition or any new symptoms that concern you.    °

## 2016-05-17 NOTE — ED Notes (Signed)
Pt tolerating PO fluids well

## 2016-05-23 ENCOUNTER — Encounter (HOSPITAL_COMMUNITY): Payer: Self-pay | Admitting: Emergency Medicine

## 2016-05-23 ENCOUNTER — Emergency Department (HOSPITAL_COMMUNITY)
Admission: EM | Admit: 2016-05-23 | Discharge: 2016-05-23 | Disposition: A | Discharge status: Home / Self Care | Payer: Medicare (Managed Care) | Attending: Emergency Medicine | Admitting: Emergency Medicine

## 2016-05-23 DIAGNOSIS — R51 Headache: Secondary | ICD-10-CM | POA: Diagnosis not present

## 2016-05-23 DIAGNOSIS — R1011 Right upper quadrant pain: Secondary | ICD-10-CM

## 2016-05-23 DIAGNOSIS — Z79899 Other long term (current) drug therapy: Secondary | ICD-10-CM | POA: Diagnosis not present

## 2016-05-23 DIAGNOSIS — R519 Headache, unspecified: Secondary | ICD-10-CM

## 2016-05-23 DIAGNOSIS — R112 Nausea with vomiting, unspecified: Secondary | ICD-10-CM

## 2016-05-23 LAB — ETHANOL

## 2016-05-23 LAB — CBC
HCT: 40.9 % (ref 36.0–46.0)
HEMOGLOBIN: 13.7 g/dL (ref 12.0–15.0)
MCH: 29.4 pg (ref 26.0–34.0)
MCHC: 33.5 g/dL (ref 30.0–36.0)
MCV: 87.8 fL (ref 78.0–100.0)
PLATELETS: 229 10*3/uL (ref 150–400)
RBC: 4.66 MIL/uL (ref 3.87–5.11)
RDW: 13.7 % (ref 11.5–15.5)
WBC: 8 10*3/uL (ref 4.0–10.5)

## 2016-05-23 LAB — COMPREHENSIVE METABOLIC PANEL
ALBUMIN: 3.9 g/dL (ref 3.5–5.0)
ALK PHOS: 125 U/L (ref 38–126)
ALT: 15 U/L (ref 14–54)
ANION GAP: 7 (ref 5–15)
AST: 17 U/L (ref 15–41)
BILIRUBIN TOTAL: 0.5 mg/dL (ref 0.3–1.2)
BUN: 13 mg/dL (ref 6–20)
CALCIUM: 9.3 mg/dL (ref 8.9–10.3)
CO2: 28 mmol/L (ref 22–32)
Chloride: 105 mmol/L (ref 101–111)
Creatinine, Ser: 0.53 mg/dL (ref 0.44–1.00)
GFR calc non Af Amer: >60 mL/min (ref >60)
Glucose, Bld: 144 mg/dL — ABNORMAL HIGH (ref 65–99)
POTASSIUM: 3.4 mmol/L — AB (ref 3.5–5.1)
SODIUM: 140 mmol/L (ref 135–145)
TOTAL PROTEIN: 6.7 g/dL (ref 6.5–8.1)

## 2016-05-23 LAB — RAPID URINE DRUG SCREEN, HOSP PERFORMED
AMPHETAMINES: NOT DETECTED
BENZODIAZEPINES: NOT DETECTED
Barbiturates: NOT DETECTED
COCAINE: NOT DETECTED
OPIATES: NOT DETECTED
TETRAHYDROCANNABINOL: NOT DETECTED

## 2016-05-23 MED ORDER — ACETAMINOPHEN 500 MG PO TABS
1000.0000 mg | ORAL_TABLET | Freq: Once | ORAL | Status: AC
  Administered 2016-05-23: 1000 mg via ORAL
  Filled 2016-05-23: qty 2

## 2016-05-23 NOTE — ED Notes (Signed)
Patient understood discharge instructions.  

## 2016-05-23 NOTE — ED Provider Notes (Signed)
[name]   CSN: [id number] Arrival date & time: 05/23/16  1255     History   Chief Complaint Chief Complaint  Patient presents with  . Nausea  . Vomiting    HPI Tracie Atkins is a 60 y.o. female.  The history is provided by the patient.  Headache   This is a recurrent problem. The current episode started 6 to 12 hours ago. The problem occurs constantly (gradual onset). The problem has been gradually worsening. Pain location: on top of head. The pain is moderate. The pain does not radiate. Associated symptoms include nausea and vomiting. Pertinent negatives include no fever, no malaise/fatigue, no chest pressure and no syncope. She has tried NSAIDs for the symptoms. The treatment provided mild relief.   Reports having a lump on her head that she had removed several years ago,which has now come back and is scabbed over. She said headache started after combing her hair this am and pulling off the scab.  Emesis started follow breakfast this am. No abd pain, diarrhea, no hematemesis or bile. Pt seen on 11/11 for the same and thought to have viral GI process.  Past Medical History:  Diagnosis Date  . Depression     There are no active problems to display for this patient.   Past Surgical History:  Procedure Laterality Date  . TUBAL LIGATION      OB History    Gravida Para Term Preterm AB Living   3 3 3     3    SAB TAB Ectopic Multiple Live Births                   Home Medications    Prior to Admission medications   Medication Sig Start Date End Date Taking? Authorizing Provider  buPROPion (WELLBUTRIN SR) 150 MG 12 hr tablet Take 150 mg by mouth 2 (two) times daily.   Yes Historical Provider, MD  ibuprofen (ADVIL,MOTRIN) 800 MG tablet Take 1 tablet (800 mg total) by mouth every 8 (eight) hours as needed for mild pain or moderate pain. 05/17/16  Yes Clayton Bibles, PA-C  famotidine (PEPCID) 20 MG tablet Take 1 tablet (20 mg total) by mouth 2 (two)  times daily. Patient not taking: Reported on 05/23/2016 05/17/16   Clayton Bibles, PA-C  HYDROcodone-homatropine [facility]) 5-1.5 MG/5ML syrup Take 5 mLs by mouth every 6 (six) hours as needed for cough (and pain). Patient not taking: Reported on 05/23/2016 05/17/16   Clayton Bibles, PA-C  promethazine (PHENERGAN) 25 MG tablet Take 1 tablet (25 mg total) by mouth every 6 (six) hours as needed for nausea or vomiting. Patient not taking: Reported on 05/23/2016 05/17/16   Clayton Bibles, PA-C    Family History No family history on file.  Social History Social History  Substance Use Topics  . Smoking status: Never Smoker  . Smokeless tobacco: Never Used  . Alcohol use 1.2 oz/week    2 Cans of beer per week     Allergies   Patient has no known allergies. Ten systems are reviewed and are negative for acute change except as noted in the HPI   Review of Systems Review of Systems  Constitutional: Negative for fever and malaise/fatigue.  Cardiovascular: Negative for syncope.  Gastrointestinal: Positive for nausea and vomiting.  Neurological: Positive for headaches.  Ten systems are reviewed and are negative for acute change except as noted in the HPI    Physical Exam Updated Vital Signs BP 161/96   Pulse  79   Temp 97.5 F (36.4 C) (Oral)   Resp 15   Ht 5\' 4"  (1.626 m)   Wt 159 lb (72.1 kg)   SpO2 99%   BMI 27.29 kg/m   Physical Exam  Constitutional: She is oriented to person, place, and time. She appears well-developed and well-nourished. No distress.  HENT:  Head: Normocephalic and atraumatic.    Nose: Nose normal.  Eyes: Conjunctivae and EOM are normal. Pupils are equal, round, and reactive to light. Right eye exhibits no discharge. Left eye exhibits no discharge. No scleral icterus.  Neck: Normal range of motion. Neck supple.  Cardiovascular: Normal rate and regular rhythm.  Exam reveals no gallop and no friction rub.   No murmur heard. Pulmonary/Chest: Effort normal and  breath sounds normal. No stridor. No respiratory distress. She has no rales.  Abdominal: Soft. She exhibits no distension. There is tenderness (discomfort) in the right upper quadrant. There is no rigidity, no rebound, no guarding, no CVA tenderness, no tenderness at McBurney's point and negative Murphy's sign.  Musculoskeletal: She exhibits no edema or tenderness.  Neurological: She is alert and oriented to person, place, and time.  Skin: Skin is warm and dry. No rash noted. She is not diaphoretic. No erythema.  Psychiatric: She has a normal mood and affect.  Vitals reviewed.    ED Treatments / Results  Labs (all labs ordered are listed, but only abnormal results are displayed) Labs Reviewed  COMPREHENSIVE METABOLIC PANEL - Abnormal; Notable for the following:       Result Value   Potassium 3.4 (*)    Glucose, Bld 144 (*)    All other components within normal limits  ETHANOL  CBC  RAPID URINE DRUG SCREEN, HOSP PERFORMED    EKG  EKG Interpretation None       Radiology No results found.  Procedures Procedures (including critical care time)  Medications Ordered in ED Medications  acetaminophen (TYLENOL) tablet 1,000 mg (1,000 mg Oral Given 05/23/16 1414)     Initial Impression / Assessment and Plan / ED Course  I have reviewed the triage vital signs and the nursing notes.  Pertinent labs & imaging results that were available during my care of the patient were reviewed by me and considered in my medical decision making (see chart for details).  Clinical Course     1. Headache Related to "wound" on her head. No surrounding or superimposed infection. No recent head trauma. No fever. Doubt meningitis. Doubt intracranial bleed. Doubt IIH. No indication for imaging.   2. Emesis with RUQ pain Report no known GB surgery. No obvious scars. abd not peritonitic. Labs reassurring. Seen previously with similar sx, contributed to viral process. POCUS not able to visualize the  GB; likely decompressed. No evidence suggesting acute cholecystitis. Possible biliary disease if GB still there. No suspicion for appendicitis, SBO, diverticulitis. Able to tolerate PO w/o antiemetics.  The patient is safe for discharge with strict return precautions.   Final Clinical Impressions(s) / ED Diagnoses   Final diagnoses:  Nausea and vomiting, intractability of vomiting not specified, unspecified vomiting type  Scalp pain  RUQ pain   Disposition: Discharge  Condition: Good  I have discussed the results, Dx and Tx plan with the patient who expressed understanding and agree(s) with the plan. Discharge instructions discussed at great length. The patient was given strict return precautions who verbalized understanding of the instructions. No further questions at time of discharge.    Current Discharge Medication List  Follow Up: [facility] [place] [phone] Call  For help establishing care with a care provider      Fatima Blank, MD 05/23/16 1510

## 2016-05-23 NOTE — Progress Notes (Signed)
Son and pt report she just moved to [facility] from [place] on 05/07/16 and does not have a pcp for follow up care Pt with medicaid of [place] and medicare plan from [facility]

## 2016-05-23 NOTE — ED Triage Notes (Addendum)
Patient is from [facility] [place], [address].  Patient is complaining of n/v associated with headache.  Patient was admitted to [facility] and is from [place].  She has a history of drug abuse.   Patient is IVC  BP:140/70 HR:84 R:16  98% on room air

## 2016-05-23 NOTE — Progress Notes (Signed)
Entered in d/c instructions Please use the resources provided to you in emergency room by case manager to assist you're your choice of doctor for follow up  These [place] uninsured resources provide possible primary care providers, resources for discounted medications, housing, dental resources, affordable care act information, plus other resources for [facility]

## 2016-05-23 NOTE — ED Notes (Signed)
Spir[place] given to  patient

## 2016-05-23 NOTE — Progress Notes (Signed)
CM discussed with pt that CM was unable to provide her with a list of [place] or [employer] [place] provider list in [place]  CM encouraged pt to call the toll free number on her cards to see if available providers in [place] CM did offer pt a list of [place] uninsured providers to use if needed These resources contain contact info for DSS to get assist with medicaid, uninsured accepting pcps, www.needymeds.org, www.goodrx.com, discounted pharmacies and other [employer] such as [employer], affordable care act, financial assistance, uninsured dental services, [place] med assist, DSS and  health department  Resources for [employer] uninsured accepting pcps like Jinny Blossom, family medicine at [employer], [facility] [place], [facility], local urgent care centers, [facility], [facility] family practice, general medical clinics, family services of the [place], [facility] urgent care plus others, medication resources, [facility] out patient pharmacies and housing Pt voiced understanding and appreciation of resources provided  Provided [facility] contact information

## 2016-05-23 NOTE — ED Notes (Signed)
Report given to [employer], Therapist, sports at [employer]

## 2016-05-23 NOTE — ED Notes (Signed)
Contacted [facility] to transport patient back to [facility]

## 2016-05-23 NOTE — ED Notes (Signed)
Bed: WA17 Expected date:  Expected time:  Means of arrival:  Comments: EMS detox/[facility]

## 2016-07-18 ENCOUNTER — Encounter (HOSPITAL_COMMUNITY): Payer: Self-pay | Admitting: Emergency Medicine

## 2016-07-18 ENCOUNTER — Emergency Department (HOSPITAL_COMMUNITY): Payer: Medicare (Managed Care)

## 2016-07-18 ENCOUNTER — Emergency Department (HOSPITAL_COMMUNITY)
Admission: EM | Admit: 2016-07-18 | Discharge: 2016-07-18 | Disposition: A | Discharge status: Home / Self Care | Payer: Medicare (Managed Care) | Attending: Emergency Medicine | Admitting: Emergency Medicine

## 2016-07-18 DIAGNOSIS — Y929 Unspecified place or not applicable: Secondary | ICD-10-CM | POA: Diagnosis not present

## 2016-07-18 DIAGNOSIS — Y999 Unspecified external cause status: Secondary | ICD-10-CM | POA: Insufficient documentation

## 2016-07-18 DIAGNOSIS — X58XXXA Exposure to other specified factors, initial encounter: Secondary | ICD-10-CM | POA: Insufficient documentation

## 2016-07-18 DIAGNOSIS — S299XXA Unspecified injury of thorax, initial encounter: Secondary | ICD-10-CM | POA: Insufficient documentation

## 2016-07-18 DIAGNOSIS — Y939 Activity, unspecified: Secondary | ICD-10-CM | POA: Diagnosis not present

## 2016-07-18 DIAGNOSIS — Z79899 Other long term (current) drug therapy: Secondary | ICD-10-CM | POA: Insufficient documentation

## 2016-07-18 DIAGNOSIS — S2242XA Multiple fractures of ribs, left side, initial encounter for closed fracture: Secondary | ICD-10-CM | POA: Insufficient documentation

## 2016-07-18 DIAGNOSIS — J209 Acute bronchitis, unspecified: Secondary | ICD-10-CM

## 2016-07-18 LAB — CBC WITH DIFFERENTIAL/PLATELET
Basophils Absolute: 0 10*3/uL (ref 0.0–0.1)
Basophils Relative: 1 %
EOS ABS: 0.1 10*3/uL (ref 0.0–0.7)
EOS PCT: 2 %
HCT: 38.6 % (ref 36.0–46.0)
Hemoglobin: 12.9 g/dL (ref 12.0–15.0)
LYMPHS ABS: 1.7 10*3/uL (ref 0.7–4.0)
Lymphocytes Relative: 29 %
MCH: 29.1 pg (ref 26.0–34.0)
MCHC: 33.4 g/dL (ref 30.0–36.0)
MCV: 87.1 fL (ref 78.0–100.0)
Monocytes Absolute: 0.6 10*3/uL (ref 0.1–1.0)
Monocytes Relative: 10 %
Neutro Abs: 3.5 10*3/uL (ref 1.7–7.7)
Neutrophils Relative %: 58 %
Platelets: 244 10*3/uL (ref 150–400)
RBC: 4.43 MIL/uL (ref 3.87–5.11)
RDW: 14.2 % (ref 11.5–15.5)
WBC: 5.9 10*3/uL (ref 4.0–10.5)

## 2016-07-18 LAB — URINALYSIS, ROUTINE W REFLEX MICROSCOPIC
BILIRUBIN URINE: NEGATIVE
Glucose, UA: NEGATIVE mg/dL
HGB URINE DIPSTICK: NEGATIVE
KETONES UR: NEGATIVE mg/dL
Leukocytes, UA: NEGATIVE
Nitrite: NEGATIVE
Protein, ur: NEGATIVE mg/dL
SPECIFIC GRAVITY, URINE: 1.009 (ref 1.005–1.030)
pH: 6 (ref 5.0–8.0)

## 2016-07-18 LAB — BASIC METABOLIC PANEL
Anion gap: 6 (ref 5–15)
BUN: 13 mg/dL (ref 6–20)
CALCIUM: 8.2 mg/dL — AB (ref 8.9–10.3)
CHLORIDE: 110 mmol/L (ref 101–111)
CO2: 27 mmol/L (ref 22–32)
CREATININE: 0.52 mg/dL (ref 0.44–1.00)
GFR calc Af Amer: >60 mL/min (ref >60)
GFR calc non Af Amer: >60 mL/min (ref >60)
Glucose, Bld: 112 mg/dL — ABNORMAL HIGH (ref 65–99)
Potassium: 4.2 mmol/L (ref 3.5–5.1)
SODIUM: 143 mmol/L (ref 135–145)

## 2016-07-18 IMAGING — CR DG RIBS W/ CHEST 3+V*L*
3 series · 3 of 3 positions shown · non-contrast
Comparison: Chest radiograph performed [DATE]

CLINICAL DATA: Acute onset of cough and left lower chest pain.
Initial encounter.

EXAM:
LEFT RIBS AND CHEST - 3+ VIEW

[w chest pa]
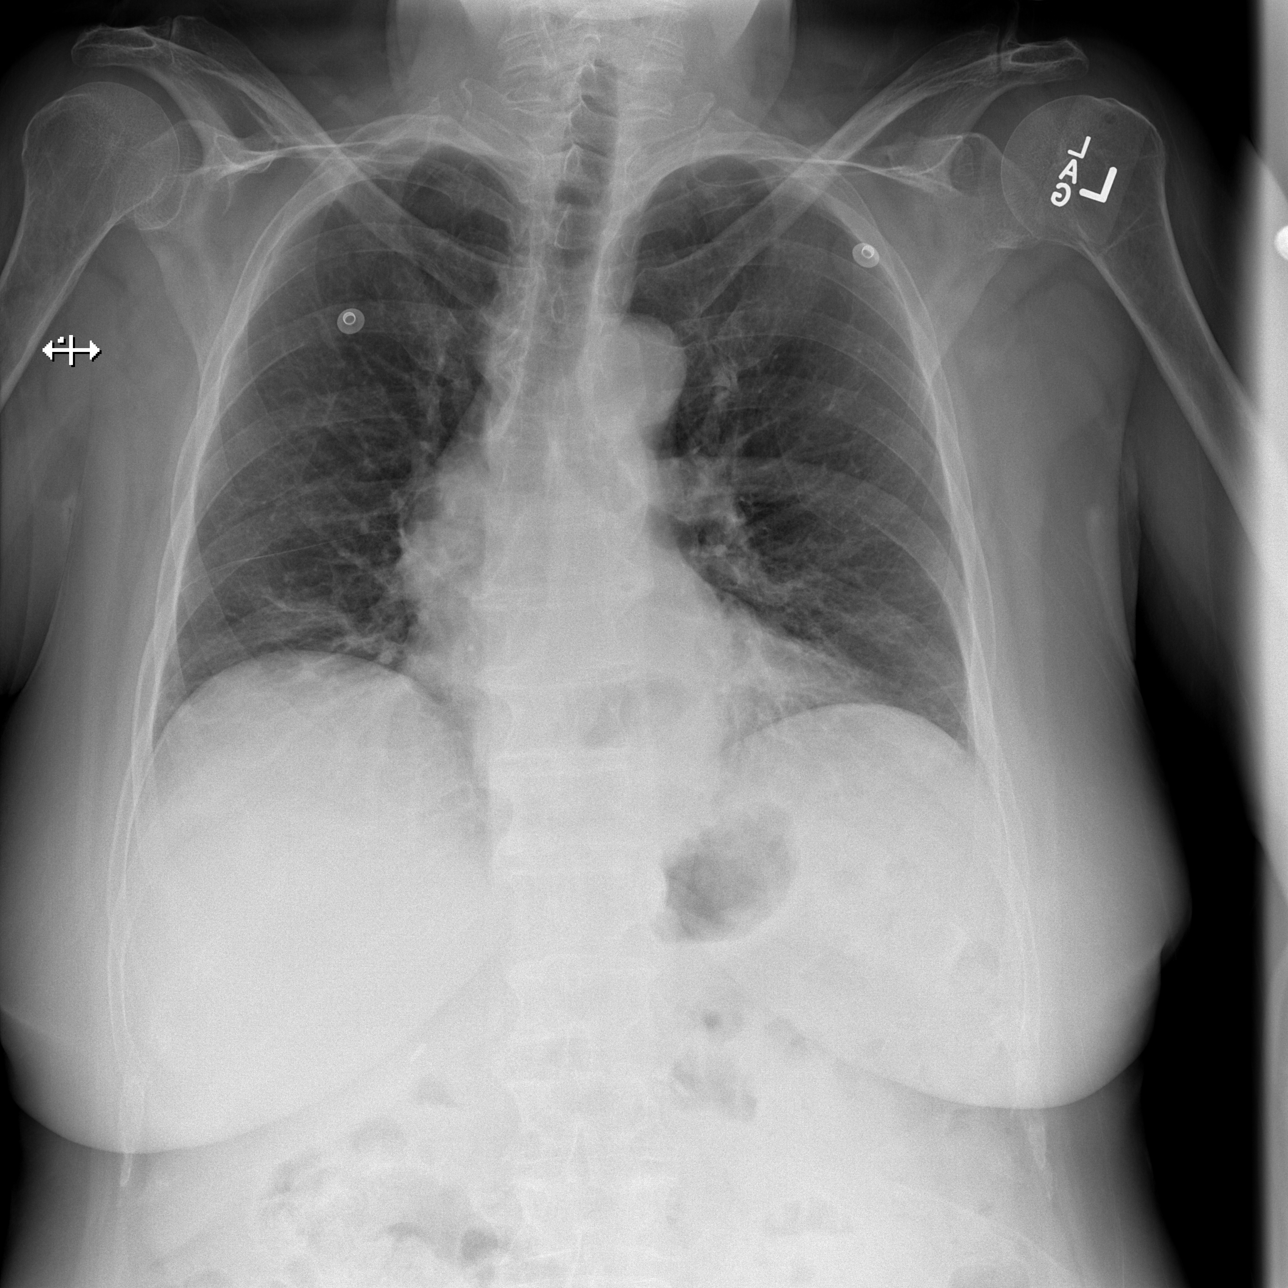

[w ribs obl left (1 of 2)]
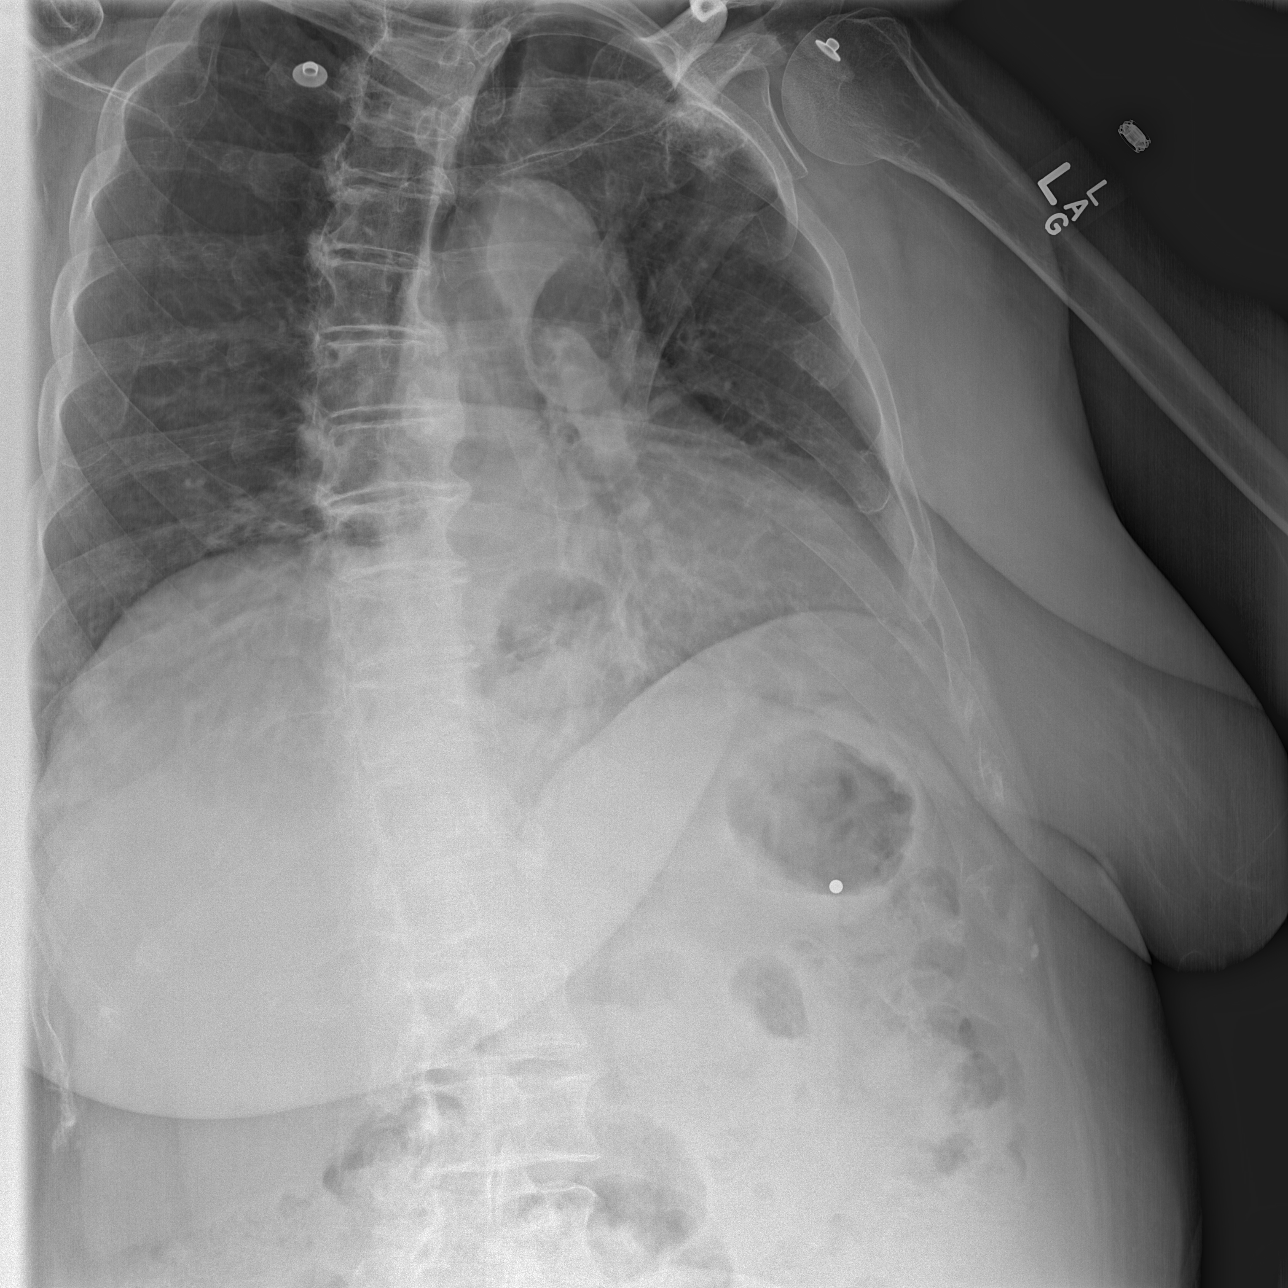

[w ribs obl left (2 of 2)]
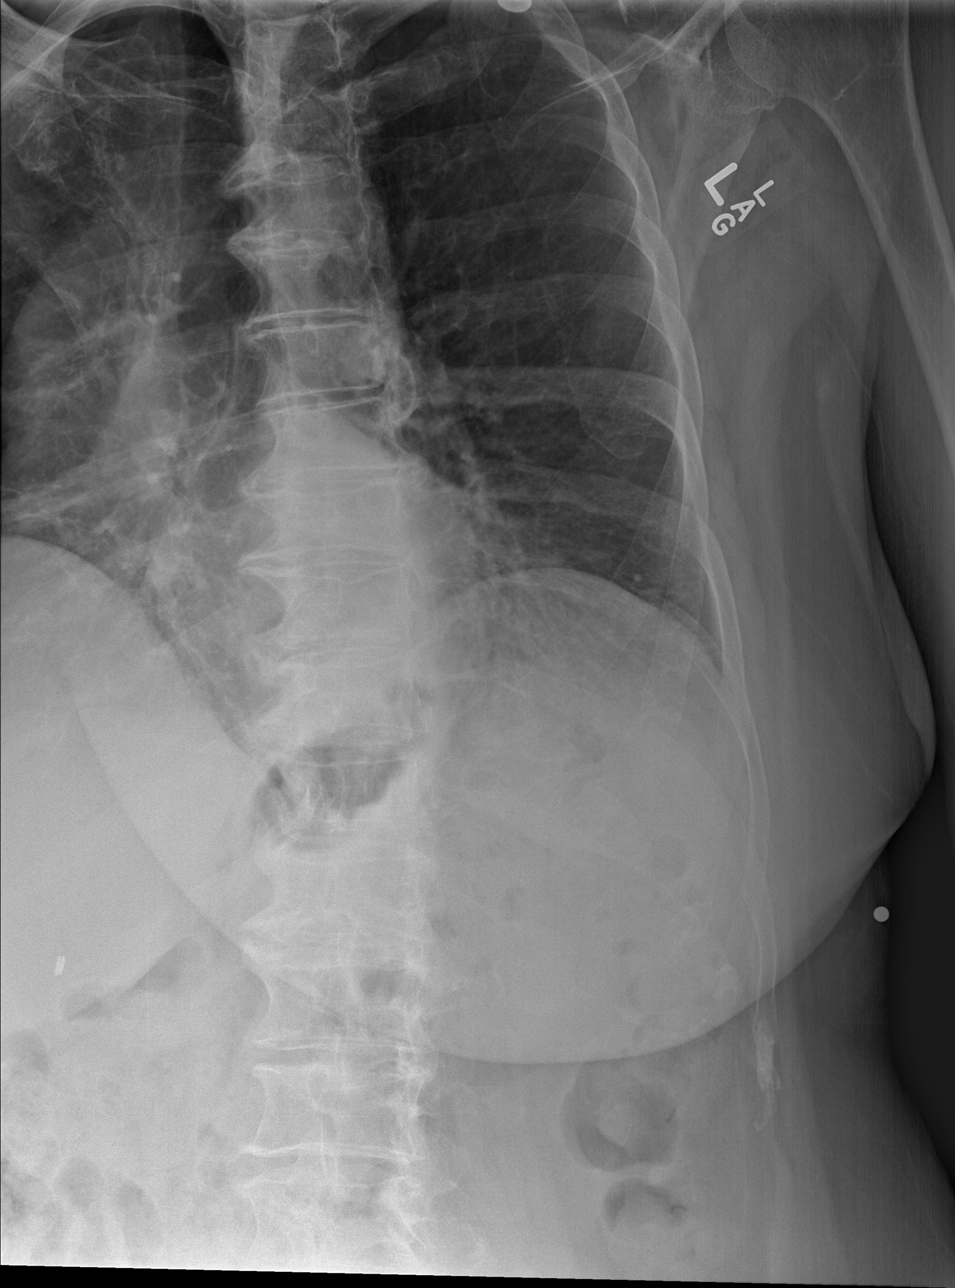

[3 of 3 positions shown; findings below may reference images not displayed]

FINDINGS: There is suggestion of minimally displaced fractures of the left
fifth through eighth anterolateral ribs.

The lungs are well-aerated. Minimal bibasilar atelectasis is noted.
There is no evidence of focal opacification, pleural effusion or
pneumothorax.

The cardiomediastinal silhouette is within normal limits. No acute
osseous abnormalities are seen. A small to moderate hiatal hernia is
seen.
IMPRESSION: 1. Suggestion of minimally displaced fractures of the left fifth
through eighth anterolateral ribs.
2. Minimal bibasilar atelectasis noted.
3. Small to moderate hiatal hernia.

## 2016-07-18 MED ORDER — AZITHROMYCIN 250 MG PO TABS: ORAL_TABLET | ORAL | 0 refills | Status: DC

## 2016-07-18 MED ORDER — HYDROCODONE-ACETAMINOPHEN 5-325 MG PO TABS: 1.0000 | ORAL_TABLET | Freq: Four times a day (QID) | ORAL | 0 refills | Status: DC | PRN

## 2016-07-18 MED ORDER — KETOROLAC TROMETHAMINE 30 MG/ML IJ SOLN
30.0000 mg | Freq: Once | INTRAMUSCULAR | Status: AC
  Administered 2016-07-18: 30 mg via INTRAVENOUS
  Filled 2016-07-18: qty 1

## 2016-07-18 NOTE — Discharge Instructions (Signed)
Zithromax as prescribed.  Hydrocodone as prescribed as needed for pain.  Return to the emergency department if you develop worsening pain, difficulty breathing, high fevers, or other new and concerning symptoms.

## 2016-07-18 NOTE — ED Provider Notes (Signed)
[facility] DEPT Provider Note   CSN: [id number] Arrival date & time: 07/18/16  0200  By signing my name below, I, Arianna Nassar, attest that this documentation has been prepared under the direction and in the presence of Veryl Speak, MD.  Electronically Signed: Julien Nordmann, ED Scribe. 07/18/16. 2:35 AM.    History   Chief Complaint Chief Complaint  Patient presents with  . Back Pain    The history is provided by the patient and the EMS personnel. No language interpreter was used.  Back Pain   This is a new problem. The current episode started 1 to 2 hours ago. The problem occurs rarely. The problem has not changed since onset.The pain is associated with no known injury. The pain is present in the lumbar spine. The quality of the pain is described as stabbing. The pain does not radiate. The pain is moderate. The symptoms are aggravated by certain positions. Pertinent negatives include no abdominal pain, no bowel incontinence and no dysuria.   HPI Comments: Tracie Atkins is a 61 y.o. female brought in by ambulance, who presents to the Emergency Department complaining of sudden onset, moderate, left lower back pain x 1 hour. Pt reports associated pain when taking a deep breath and a productive cough that brings up dark Majewski sputum x 2 weeks. She also reports left anterior chest wall pain when coughing. Pt says that she was awoken out of her sleep ~ 1 hour ago for sharp back pain in her left lower back. She rates her pain a 10/10. She also notes coughing increases her pain. Pt received 50 mg of fentanyl by EMS. Pt denies nausea, vomiting, dysuria, and hematuria.  Past Medical History:  Diagnosis Date  . Depression     There are no active problems to display for this patient.   Past Surgical History:  Procedure Laterality Date  . TUBAL LIGATION      OB History    Gravida Para Term Preterm AB Living   3 3 3     3    SAB TAB Ectopic Multiple Live Births                    Home Medications    Prior to Admission medications   Medication Sig Start Date End Date Taking? Authorizing Provider  buPROPion (WELLBUTRIN SR) 150 MG 12 hr tablet Take 150 mg by mouth 2 (two) times daily.    Historical Provider, MD  famotidine (PEPCID) 20 MG tablet Take 1 tablet (20 mg total) by mouth 2 (two) times daily. Patient not taking: Reported on 05/23/2016 05/17/16   Clayton Bibles, PA-C  HYDROcodone-homatropine [facility]) 5-1.5 MG/5ML syrup Take 5 mLs by mouth every 6 (six) hours as needed for cough (and pain). Patient not taking: Reported on 05/23/2016 05/17/16   Clayton Bibles, PA-C  ibuprofen (ADVIL,MOTRIN) 800 MG tablet Take 1 tablet (800 mg total) by mouth every 8 (eight) hours as needed for mild pain or moderate pain. 05/17/16   Clayton Bibles, PA-C  promethazine (PHENERGAN) 25 MG tablet Take 1 tablet (25 mg total) by mouth every 6 (six) hours as needed for nausea or vomiting. Patient not taking: Reported on 05/23/2016 05/17/16   Clayton Bibles, PA-C    Family History No family history on file.  Social History Social History  Substance Use Topics  . Smoking status: Never Smoker  . Smokeless tobacco: Never Used  . Alcohol use 1.2 oz/week    2 Cans of beer per week  Allergies   Patient has no known allergies.   Review of Systems Review of Systems  Respiratory: Positive for cough.   Gastrointestinal: Negative for abdominal pain, bowel incontinence, nausea and vomiting.  Genitourinary: Negative for dysuria and hematuria.  Musculoskeletal: Positive for back pain.  All other systems reviewed and are negative.    Physical Exam Updated Vital Signs BP 115/76 (BP Location: Left Arm)   Pulse 96   Temp 98 F (36.7 C) (Oral)   Resp 18   SpO2 98%   Physical Exam  Constitutional: She is oriented to person, place, and time. She appears well-developed and well-nourished. No distress.  HENT:  Head: Normocephalic and atraumatic.  Eyes: EOM are normal.  Neck: Normal range  of motion.  Cardiovascular: Normal rate, regular rhythm and normal heart sounds.   Pulmonary/Chest: Effort normal and breath sounds normal. No respiratory distress. She has no wheezes. She has no rales. She exhibits tenderness.  TTP over the left lateral ribs.   Abdominal: Soft. She exhibits no distension. There is no tenderness.  Musculoskeletal: Normal range of motion.  Neurological: She is alert and oriented to person, place, and time.  Skin: Skin is warm and dry.  Psychiatric: She has a normal mood and affect. Judgment normal.  Nursing note and vitals reviewed.    ED Treatments / Results  DIAGNOSTIC STUDIES: Oxygen Saturation is 98% on RA, normal by my interpretation.  COORDINATION OF CARE:  2:35 AM Discussed treatment plan with pt at bedside and pt agreed to plan.  Labs (all labs ordered are listed, but only abnormal results are displayed) Labs Reviewed - No data to display  EKG  EKG Interpretation None       Radiology No results found.  Procedures Procedures (including critical care time)  Medications Ordered in ED Medications - No data to display   Initial Impression / Assessment and Plan / ED Course  I have reviewed the triage vital signs and the nursing notes.  Pertinent labs & imaging results that were available during my care of the patient were reviewed by me and considered in my medical decision making (see chart for details).  Clinical Course     Patient presents with a two-week history of cough. She woke this morning with pain in her left lateral ribs. X-rays suggest fractures, however no pneumothorax or infiltrate. It appears as though this patient may have broken ribs from coughing and she denies any other injury or trauma. She will be treated with Zithromax for bronchitis and pain medication for her ribs. She is to return as needed for any problems.  Final Clinical Impressions(s) / ED Diagnoses   Final diagnoses:  None   I personally performed  the services described in this documentation, which was scribed in my presence. The recorded information has been reviewed and is accurate.   New Prescriptions New Prescriptions   No medications on file     Veryl Speak, MD 07/18/16 [phone]

## 2016-07-18 NOTE — ED Notes (Signed)
Bed: [id number] Expected date:  Expected time:  Means of arrival:  Comments: 61 yr old back pain

## 2016-07-18 NOTE — ED Triage Notes (Signed)
Pt comes to ed, via ems, c/o lower left lumbar back pain. Pt states pain started at 1:15am tonight, was woke up out of sleep, and verbalizes pain 10 out 10.   18 in LAC, 50 mcg  Fentanyl  Given.  V/s arrival 96/60, pulse 92, sinus, 92 room air.  Initial bp 110/80 after pain meds 96/60. Pt comes from home.  [address], [place], [phone]

## 2016-08-19 ENCOUNTER — Emergency Department (HOSPITAL_COMMUNITY)
Admission: EM | Admit: 2016-08-19 | Discharge: 2016-08-19 | Disposition: A | Discharge status: Home / Self Care | Payer: Medicare (Managed Care) | Attending: Emergency Medicine | Admitting: Emergency Medicine

## 2016-08-19 ENCOUNTER — Encounter (HOSPITAL_COMMUNITY): Payer: Self-pay | Admitting: Emergency Medicine

## 2016-08-19 ENCOUNTER — Emergency Department (HOSPITAL_COMMUNITY): Payer: Medicare (Managed Care)

## 2016-08-19 DIAGNOSIS — Z5321 Procedure and treatment not carried out due to patient leaving prior to being seen by health care provider: Secondary | ICD-10-CM

## 2016-08-19 DIAGNOSIS — H9209 Otalgia, unspecified ear: Secondary | ICD-10-CM

## 2016-08-19 DIAGNOSIS — H9201 Otalgia, right ear: Secondary | ICD-10-CM | POA: Diagnosis not present

## 2016-08-19 IMAGING — CR DG CHEST 2V
2 series · 2 of 2 positions shown · non-contrast
Comparison: [DATE]

CLINICAL DATA: Productive cough for 1 month

EXAM:
CHEST  2 VIEW

[chest pa]
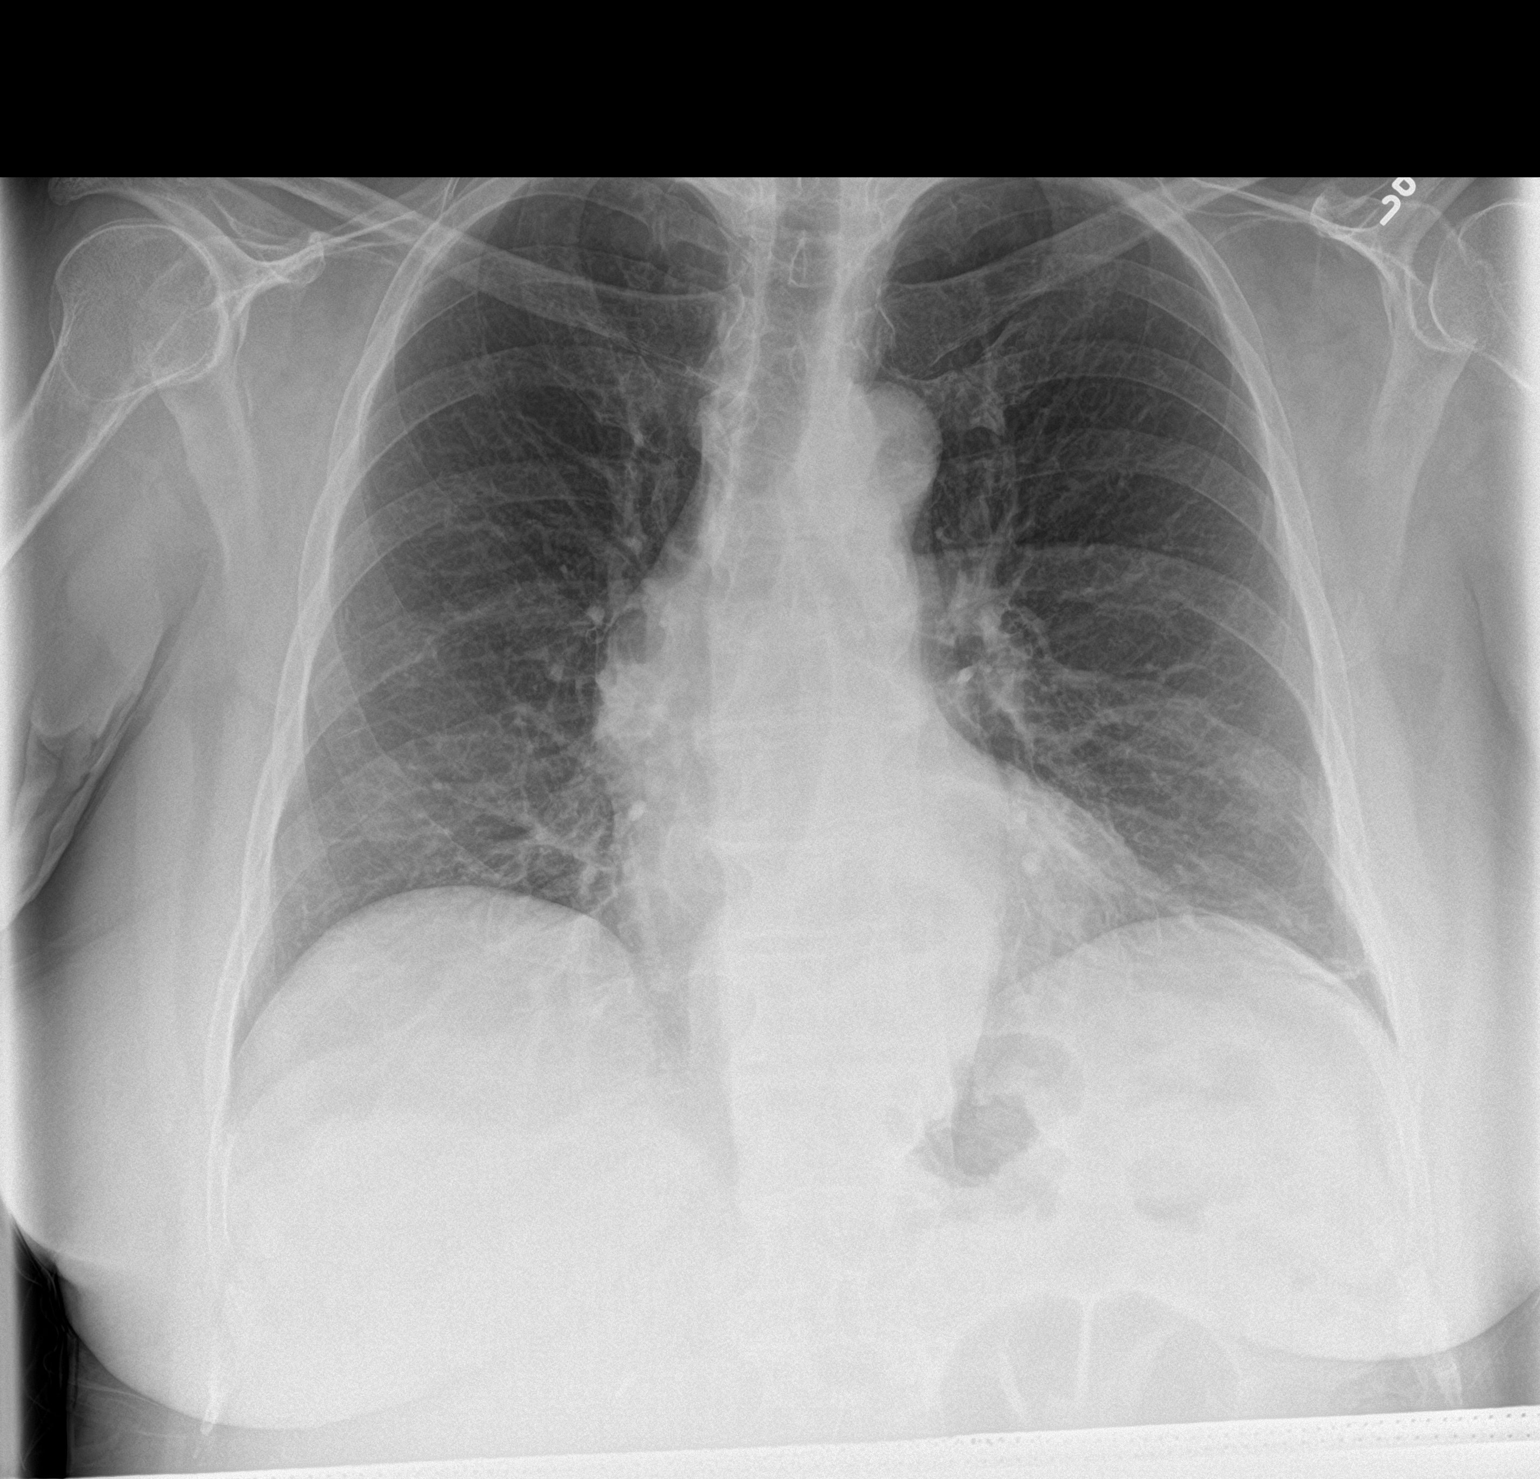

[chest lat]
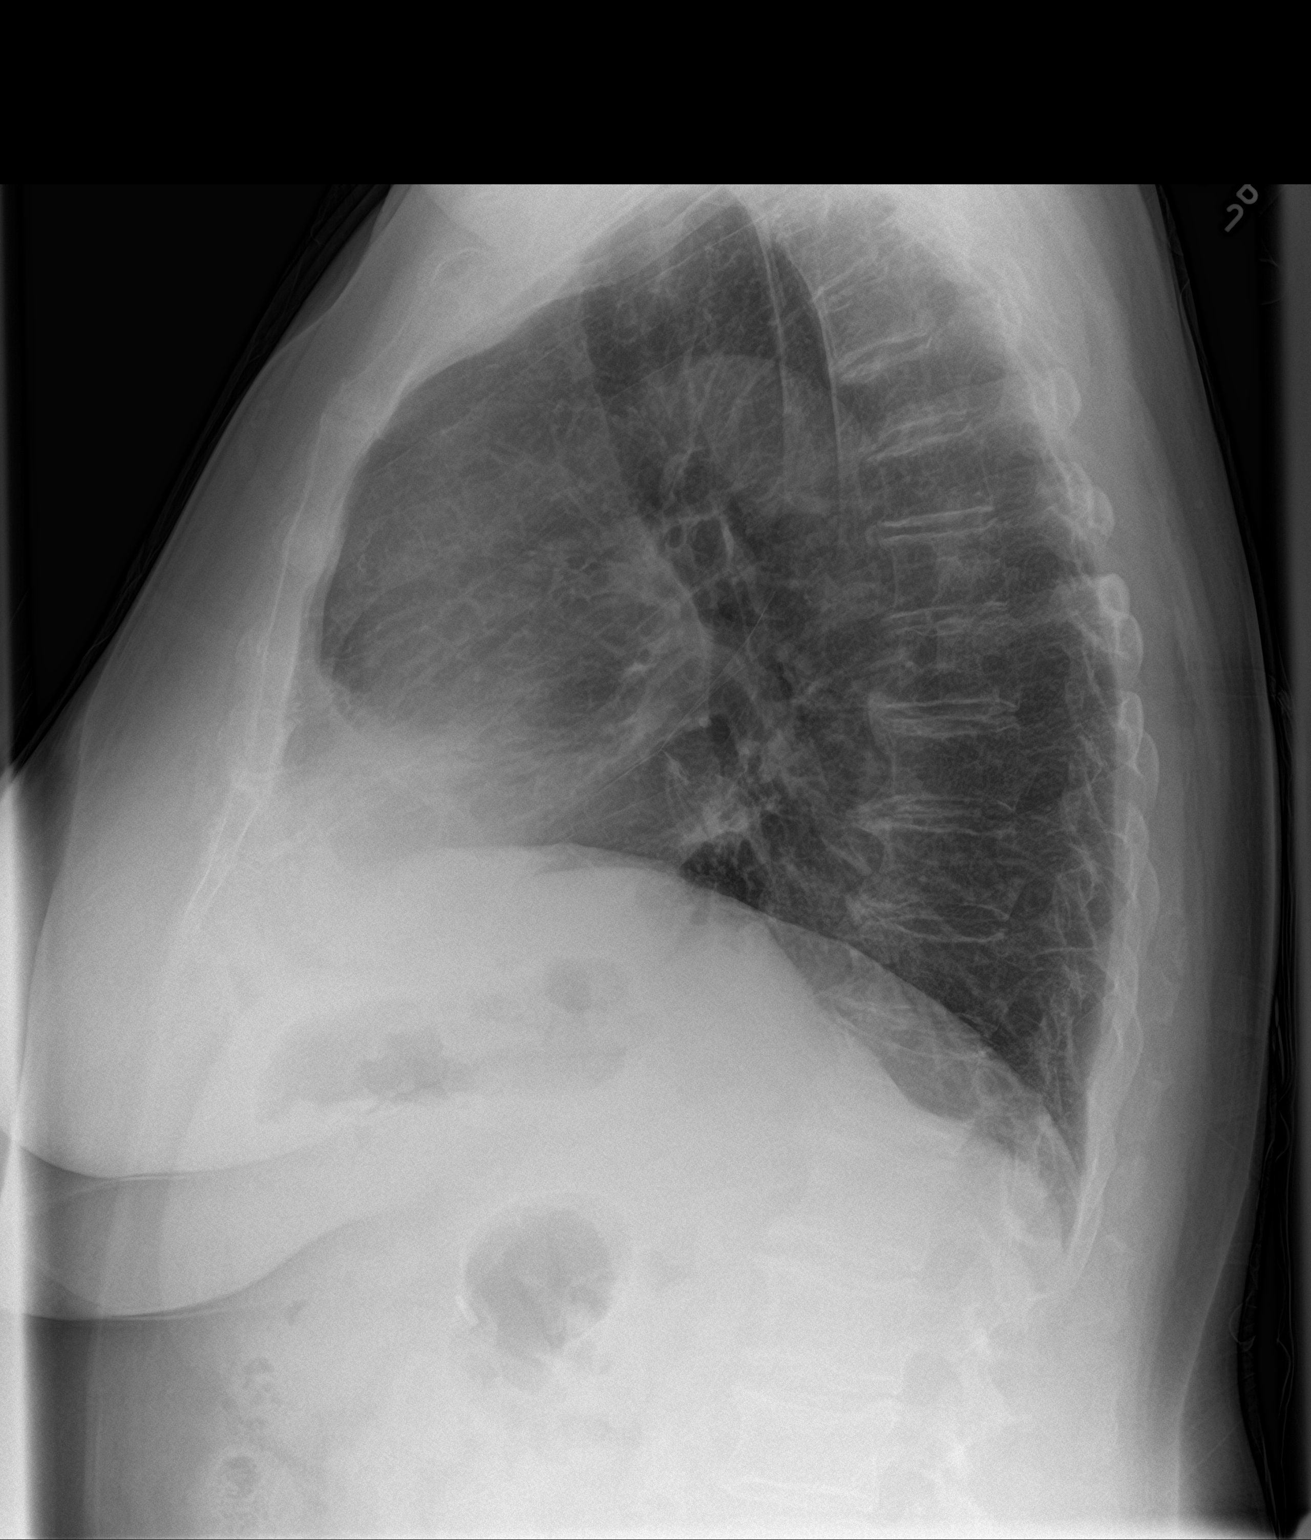

[2 of 2 positions shown; findings below may reference images not displayed]

FINDINGS: Cardiomediastinal silhouette is stable. No infiltrate or pulmonary
edema. There is no pneumothorax. Mild perihilar bronchitic changes.
Osteopenia and mild degenerative changes thoracic spine
IMPRESSION: No infiltrate or pulmonary edema. Mild perihilar bronchitic changes.

## 2016-08-19 NOTE — ED Triage Notes (Signed)
Pt sts URI sx x 1 month and right ear pain

## 2016-08-19 NOTE — ED Notes (Signed)
Pt called to obtain vitals. Pt did not answer.

## 2016-08-19 NOTE — ED Notes (Signed)
Pt called for room x2 with no answer 

## 2016-08-19 NOTE — ED Notes (Signed)
Pt called for room x2 

## 2016-08-22 ENCOUNTER — Encounter (HOSPITAL_COMMUNITY): Payer: Self-pay | Admitting: Emergency Medicine

## 2016-08-22 ENCOUNTER — Emergency Department (HOSPITAL_COMMUNITY)
Admission: EM | Admit: 2016-08-22 | Discharge: 2016-08-22 | Disposition: A | Discharge status: Home / Self Care | Payer: Medicare (Managed Care) | Attending: Emergency Medicine | Admitting: Emergency Medicine

## 2016-08-22 ENCOUNTER — Emergency Department (HOSPITAL_COMMUNITY): Payer: Medicare (Managed Care)

## 2016-08-22 DIAGNOSIS — R059 Cough, unspecified: Secondary | ICD-10-CM

## 2016-08-22 DIAGNOSIS — R0982 Postnasal drip: Secondary | ICD-10-CM | POA: Insufficient documentation

## 2016-08-22 DIAGNOSIS — R05 Cough: Secondary | ICD-10-CM | POA: Diagnosis present

## 2016-08-22 DIAGNOSIS — H6121 Impacted cerumen, right ear: Secondary | ICD-10-CM | POA: Diagnosis not present

## 2016-08-22 LAB — CBC
HCT: 39 % (ref 36.0–46.0)
Hemoglobin: 12.7 g/dL (ref 12.0–15.0)
MCH: 28.6 pg (ref 26.0–34.0)
MCHC: 32.6 g/dL (ref 30.0–36.0)
MCV: 87.8 fL (ref 78.0–100.0)
Platelets: 224 10*3/uL (ref 150–400)
RBC: 4.44 MIL/uL (ref 3.87–5.11)
RDW: 13.6 % (ref 11.5–15.5)
WBC: 6.4 10*3/uL (ref 4.0–10.5)

## 2016-08-22 LAB — URINALYSIS, ROUTINE W REFLEX MICROSCOPIC
BILIRUBIN URINE: NEGATIVE
Glucose, UA: NEGATIVE mg/dL
HGB URINE DIPSTICK: NEGATIVE
KETONES UR: NEGATIVE mg/dL
Leukocytes, UA: NEGATIVE
NITRITE: NEGATIVE
PH: 8 (ref 5.0–8.0)
Protein, ur: NEGATIVE mg/dL
SPECIFIC GRAVITY, URINE: 1.014 (ref 1.005–1.030)

## 2016-08-22 LAB — COMPREHENSIVE METABOLIC PANEL
ALBUMIN: 3.7 g/dL (ref 3.5–5.0)
ALK PHOS: 134 U/L — AB (ref 38–126)
ALT: 15 U/L (ref 14–54)
AST: 17 U/L (ref 15–41)
Anion gap: 5 (ref 5–15)
BILIRUBIN TOTAL: 0.3 mg/dL (ref 0.3–1.2)
BUN: 16 mg/dL (ref 6–20)
CALCIUM: 9.2 mg/dL (ref 8.9–10.3)
CO2: 28 mmol/L (ref 22–32)
Chloride: 108 mmol/L (ref 101–111)
Creatinine, Ser: 0.66 mg/dL (ref 0.44–1.00)
GFR calc Af Amer: >60 mL/min (ref >60)
GFR calc non Af Amer: >60 mL/min (ref >60)
GLUCOSE: 106 mg/dL — AB (ref 65–99)
Potassium: 3.7 mmol/L (ref 3.5–5.1)
SODIUM: 141 mmol/L (ref 135–145)
TOTAL PROTEIN: 6.9 g/dL (ref 6.5–8.1)

## 2016-08-22 LAB — LIPASE, BLOOD: Lipase: 25 U/L (ref 11–51)

## 2016-08-22 LAB — I-STAT TROPONIN, ED: TROPONIN I, POC: 0 ng/mL (ref 0.00–0.08)

## 2016-08-22 IMAGING — CR DG CHEST 2V
2 series · 2 of 2 positions shown · non-contrast
Comparison: [DATE]

CLINICAL DATA: Chest pain and shortness of Breath

EXAM:
CHEST  2 VIEW

[w chest pa]
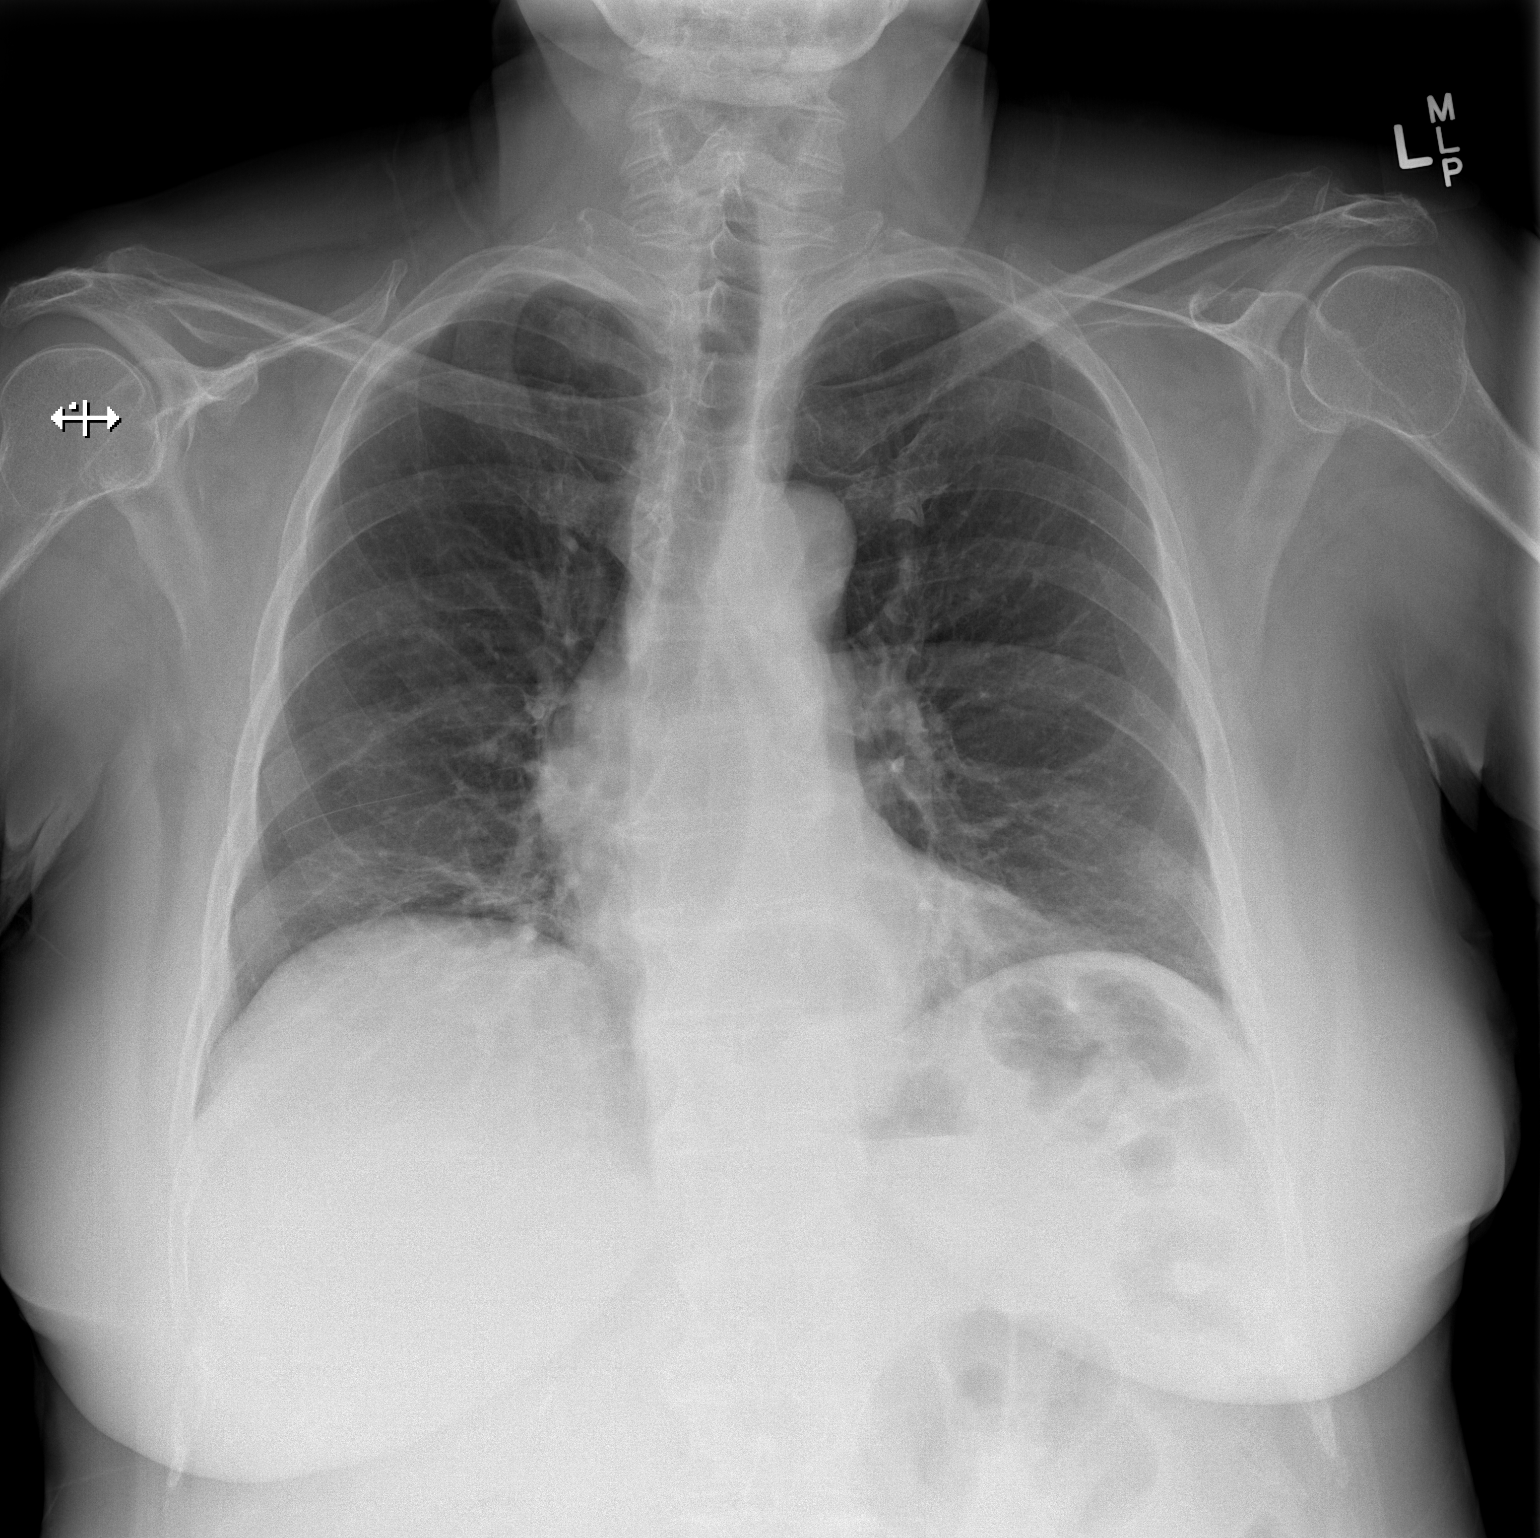

[w chest lat]
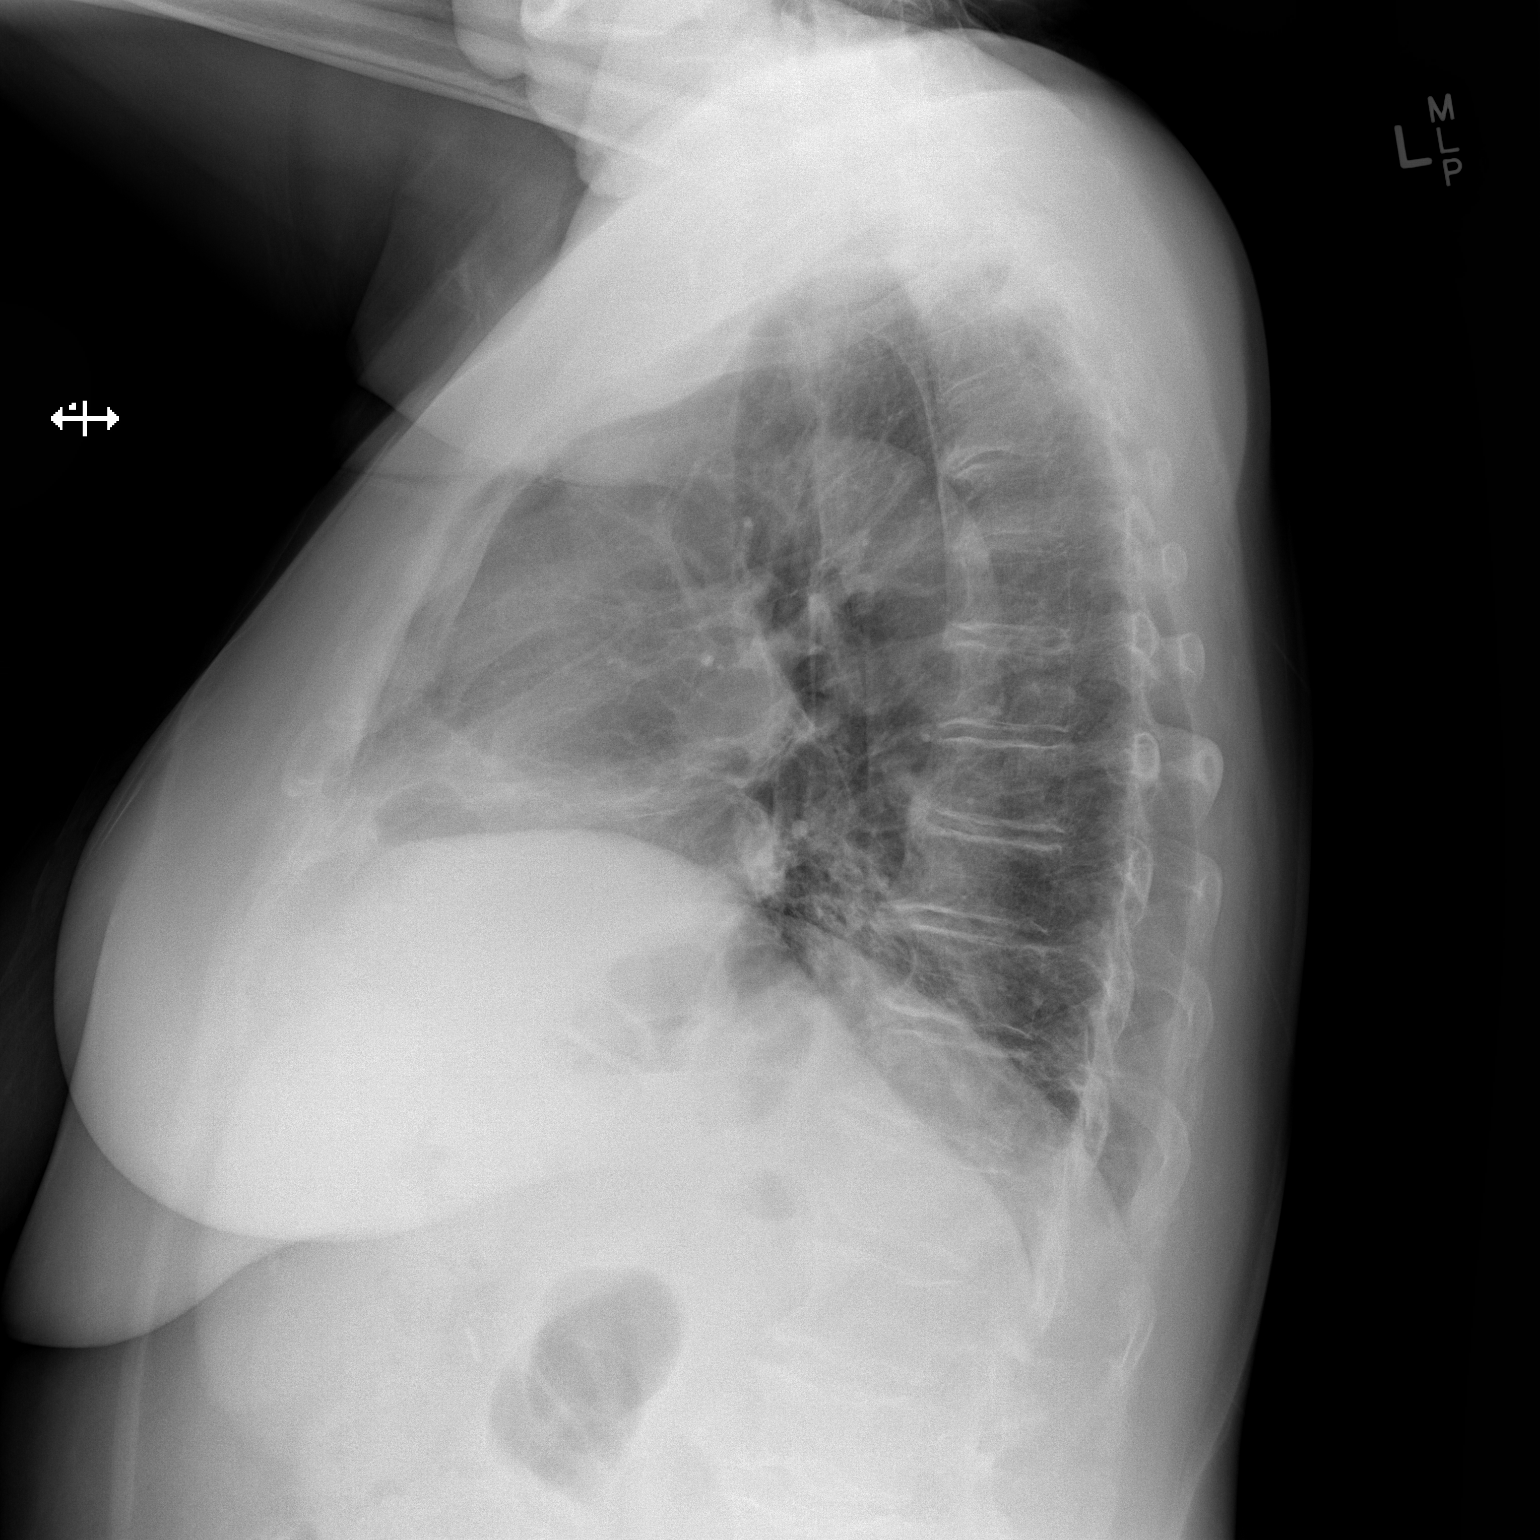

[2 of 2 positions shown; findings below may reference images not displayed]

FINDINGS: Cardiac shadow is within normal limits. Hiatal hernia is again seen.
Minimal right basilar atelectasis is noted. No focal confluent
infiltrate or sizable effusion is seen. No bony abnormality is
noted.
IMPRESSION: Mild right basilar atelectasis.

## 2016-08-22 MED ORDER — DOCUSATE SODIUM 50 MG/5ML PO LIQD
50.0000 mg | Freq: Once | ORAL | Status: AC
  Administered 2016-08-22: 50 mg via OTIC
  Filled 2016-08-22: qty 10

## 2016-08-22 NOTE — ED Notes (Signed)
Bed: [facility] Expected date:  Expected time:  Means of arrival:  Comments: No bed  

## 2016-08-22 NOTE — Discharge Instructions (Signed)
Your lab work today was normal.  Your chest x-ray did not show any signs of pneumonia. Can use over the counter cough medication when needed.  Recommend robitussin or delsym. Follow-up with your primary care doctor.  If you do not have one, may see [facility]. Return here for new concerns.

## 2016-08-22 NOTE — ED Triage Notes (Addendum)
Pt reports cough, abd pain, and SOB the past month. No n/v/d. Pt also complains of ear pain.

## 2016-08-22 NOTE — ED Notes (Signed)
First attempted to collect labs PT did not answer to name in waiting room

## 2016-08-22 NOTE — ED Notes (Signed)
Pt called back for blood draw x1 with no response.

## 2016-08-22 NOTE — ED Notes (Signed)
Called pt back x2 with no response.

## 2016-08-22 NOTE — ED Provider Notes (Signed)
[facility] DEPT Provider Note   CSN: [id number] Arrival date & time: 08/22/16  1403     History   Chief Complaint Chief Complaint  Patient presents with  . Cough  . Abdominal Pain    HPI Tracie Atkins is a 61 y.o. female.  The history is provided by the patient and medical records.  Cough  Associated symptoms include ear pain. Pertinent negatives include no chest pain and no shortness of breath.  Abdominal Pain   Associated symptoms include nausea.    61 year old female with history of depression here with multiple complaints. Reports for the past month she has been having productive cough with thick sputum, postnasal drip, nasal congestion, and right ear pain. States her right ear feels like it is "clogged". States the postnasal drip makes her feel somewhat nauseated when she swallows it so she has been trying not to. States she has had some intermittent nausea and vomiting, last episode was 4 days ago. She denies any diarrhea. No fever or chills. No abdominal pain at present.  No sick contacts. She denies any chest pain or shortness of breath.  States she has been able to eat and drink normally. She denies any history of asthma or COPD. No prior abdominal surgeries.  Patient requesting to eat and drink during assessment.  Has been taking some nyquil for cough.  Past Medical History:  Diagnosis Date  . Depression     There are no active problems to display for this patient.   Past Surgical History:  Procedure Laterality Date  . TUBAL LIGATION      OB History    Gravida Para Term Preterm AB Living   _0 SAB TAB Ectopic Multiple Live Births                   Home Medications    Prior to Admission medications   Not on File    Family History History reviewed. No pertinent family history.  Social History Social History  Substance Use Topics  . Smoking status: Never Smoker  . Smokeless tobacco: Never Used  . Alcohol use 1.2 oz/week    2 Cans of  beer per week     Allergies   Patient has no known allergies.   Review of Systems Review of Systems  HENT: Positive for congestion, ear pain and postnasal drip.   Respiratory: Positive for cough. Negative for shortness of breath.   Cardiovascular: Negative for chest pain.  Gastrointestinal: Positive for nausea.  All other systems reviewed and are negative.    Physical Exam Updated Vital Signs BP 105/77 (BP Location: Right Arm)   Pulse 88   Temp 97.9 F (36.6 C) (Oral)   Resp 20   SpO2 100%   Physical Exam  Constitutional: She is oriented to person, place, and time. She appears well-developed and well-nourished.  HENT:  Head: Normocephalic and atraumatic.  Nose: Mucosal edema present.  Mouth/Throat: Oropharynx is clear and moist.  + nasal congestion and PND Right cerumen impaction, left ear normal  Eyes: Conjunctivae and EOM are normal. Pupils are equal, round, and reactive to light.  Neck: Normal range of motion.  Cardiovascular: Normal rate, regular rhythm and normal heart sounds.   Pulmonary/Chest: Effort normal and breath sounds normal. No respiratory distress. She has no wheezes. She has no rhonchi.  Lungs clear, no wheezes or rhonchi  Abdominal: Soft. Bowel sounds are normal. There is no tenderness. There is no  rebound.  Soft, non-tender, normal bowel sounds  Musculoskeletal: Normal range of motion.  Neurological: She is alert and oriented to person, place, and time.  Skin: Skin is warm and dry.  Psychiatric: She has a normal mood and affect.  Nursing note and vitals reviewed.    ED Treatments / Results  Labs (all labs ordered are listed, but only abnormal results are displayed) Labs Reviewed  COMPREHENSIVE METABOLIC PANEL - Abnormal; Notable for the following:       Result Value   Glucose, Bld 106 (*)    Alkaline Phosphatase 134 (*)    All other components within normal limits  URINALYSIS, ROUTINE W REFLEX MICROSCOPIC - Abnormal; Notable for the  following:    APPearance CLOUDY (*)    All other components within normal limits  LIPASE, BLOOD  CBC  I-STAT TROPOININ, ED    EKG  EKG Interpretation None       Radiology Dg Chest 2 View  Result Date: 08/22/2016 CLINICAL DATA:  Chest pain and shortness of Breath EXAM: CHEST  2 VIEW COMPARISON:  08/19/2016 FINDINGS: Cardiac shadow is within normal limits. Hiatal hernia is again seen. Minimal right basilar atelectasis is noted. No focal confluent infiltrate or sizable effusion is seen. No bony abnormality is noted. IMPRESSION: Mild right basilar atelectasis. Electronically Signed   By: Inez Catalina M.D.   On: 08/22/2016 15:35    Procedures .Ear Cerumen Removal Date/Time: 08/22/2016 7:41 PM Performed by: Larene Pickett Authorized by: Larene Pickett   Consent:    Consent obtained:  Verbal   Consent given by:  Patient   Risks discussed:  Pain, TM perforation, incomplete removal and dizziness   Alternatives discussed:  No treatment and delayed treatment Procedure details:    Location:  R ear   Procedure type: irrigation   Post-procedure details:    Inspection:  TM intact   Hearing quality:  Improved   Patient tolerance of procedure:  Tolerated well, no immediate complications Comments:     Colace applied to right ear, irrigated several times with improvement.  Patient reports hearing is better.  No signs of TM rupture or other complications.    (including critical care time)  Medications Ordered in ED Medications  docusate (COLACE) 50 MG/5ML liquid 50 mg (50 mg Right Ear Given 08/22/16 1803)     Initial Impression / Assessment and Plan / ED Course  I have reviewed the triage vital signs and the nursing notes.  Pertinent labs & imaging results that were available during my care of the patient were reviewed by me and considered in my medical decision making (see chart for details).  61 year old female here with multiple complaints. Reports cough, nasal congestion,  postnasal drip, right ear pain, and nausea from PND.  Denies abdominal pain currently. She is afebrile and nontoxic. She does have evidence of nasal congestion and postnasal drip on exam. She also has a right cerumen impaction which is likely with this causing her ear pain. Her abdomen is soft and benign. She has been eating and drinking here in the emergency department without issue. Screening lab work is reassuring, no leukocytosis.  Alk phos mildly elevated however LFT's and bili are WNL.  Symptoms currently not consistent with biliary disease. Chest x-ray is clear. Colace applied to right ear, irrigated several times.  Patient reports hearing improved and TM visualized. No signs of TM rupture or other complications. Given rather benign exam here and reassuring labs, will plan to discharge home with supportive  care. Recommended over-the-counter cough medications as needed. Recommended to follow-up with PCP and/or wellness clinic if not improving in the next few days.  Discussed plan with patient, she acknowledged understanding and agreed with plan of care.  Return precautions given for new or worsening symptoms.  Final Clinical Impressions(s) / ED Diagnoses   Final diagnoses:  Cough  Post-nasal drip  Impacted cerumen of right ear    New Prescriptions There are no discharge medications for this patient.    Larene Pickett, PA-C 08/22/16 [place], PA-C 08/22/16 1952    Varney Biles, MD 08/23/16 [phone]

## 2016-09-02 ENCOUNTER — Encounter (HOSPITAL_COMMUNITY): Payer: Self-pay | Admitting: *Deleted

## 2016-09-02 DIAGNOSIS — R109 Unspecified abdominal pain: Secondary | ICD-10-CM | POA: Insufficient documentation

## 2016-09-02 DIAGNOSIS — Z5321 Procedure and treatment not carried out due to patient leaving prior to being seen by health care provider: Secondary | ICD-10-CM | POA: Diagnosis not present

## 2016-09-02 LAB — COMPREHENSIVE METABOLIC PANEL
ALT: 13 U/L — ABNORMAL LOW (ref 14–54)
ANION GAP: 7 (ref 5–15)
AST: 17 U/L (ref 15–41)
Albumin: 3.7 g/dL (ref 3.5–5.0)
Alkaline Phosphatase: 146 U/L — ABNORMAL HIGH (ref 38–126)
BILIRUBIN TOTAL: 0.3 mg/dL (ref 0.3–1.2)
BUN: 20 mg/dL (ref 6–20)
CO2: 24 mmol/L (ref 22–32)
Calcium: 9.5 mg/dL (ref 8.9–10.3)
Chloride: 106 mmol/L (ref 101–111)
Creatinine, Ser: 0.65 mg/dL (ref 0.44–1.00)
Glucose, Bld: 129 mg/dL — ABNORMAL HIGH (ref 65–99)
POTASSIUM: 4.2 mmol/L (ref 3.5–5.1)
Sodium: 137 mmol/L (ref 135–145)
TOTAL PROTEIN: 6.6 g/dL (ref 6.5–8.1)

## 2016-09-02 LAB — URINALYSIS, ROUTINE W REFLEX MICROSCOPIC
Bilirubin Urine: NEGATIVE
Glucose, UA: NEGATIVE mg/dL
Hgb urine dipstick: NEGATIVE
KETONES UR: NEGATIVE mg/dL
LEUKOCYTES UA: NEGATIVE
NITRITE: NEGATIVE
PH: 5 (ref 5.0–8.0)
Protein, ur: NEGATIVE mg/dL
Specific Gravity, Urine: 1.014 (ref 1.005–1.030)

## 2016-09-02 LAB — CBC
HEMATOCRIT: 41.2 % (ref 36.0–46.0)
HEMOGLOBIN: 13.4 g/dL (ref 12.0–15.0)
MCH: 28.9 pg (ref 26.0–34.0)
MCHC: 32.5 g/dL (ref 30.0–36.0)
MCV: 89 fL (ref 78.0–100.0)
Platelets: 256 10*3/uL (ref 150–400)
RBC: 4.63 MIL/uL (ref 3.87–5.11)
RDW: 13.6 % (ref 11.5–15.5)
WBC: 7.3 10*3/uL (ref 4.0–10.5)

## 2016-09-02 LAB — LIPASE, BLOOD: Lipase: 26 U/L (ref 11–51)

## 2016-09-02 MED ORDER — ONDANSETRON 4 MG PO TBDP
ORAL_TABLET | ORAL | Status: AC
  Filled 2016-09-02: qty 1

## 2016-09-02 MED ORDER — ONDANSETRON 4 MG PO TBDP
4.0000 mg | ORAL_TABLET | Freq: Once | ORAL | Status: AC | PRN
  Administered 2016-09-02: 4 mg via ORAL

## 2016-09-02 NOTE — ED Triage Notes (Signed)
Pt to ED by PTAR: Pt c/o possible dental infection that she hasn't been able to follow up with her dentist yet. Has been having generalized abdominal pain for two days. Reports burning sensation, worsen when lying flat

## 2016-09-03 ENCOUNTER — Emergency Department (HOSPITAL_COMMUNITY)
Admission: EM | Admit: 2016-09-03 | Discharge: 2016-09-03 | Disposition: A | Discharge status: Home / Self Care | Payer: Medicare (Managed Care) | Attending: Emergency Medicine | Admitting: Emergency Medicine

## 2016-09-03 NOTE — ED Notes (Signed)
Multiple calls made in lobby for rooming.  Patient did not answer.

## 2016-09-03 NOTE — ED Notes (Signed)
Pt called for in waiting area for vital signs. No answer

## 2016-09-09 DIAGNOSIS — F329 Major depressive disorder, single episode, unspecified: Secondary | ICD-10-CM | POA: Diagnosis not present

## 2016-09-09 DIAGNOSIS — E559 Vitamin D deficiency, unspecified: Secondary | ICD-10-CM | POA: Diagnosis not present

## 2016-09-09 DIAGNOSIS — R05 Cough: Secondary | ICD-10-CM | POA: Diagnosis not present

## 2016-09-09 DIAGNOSIS — E785 Hyperlipidemia, unspecified: Secondary | ICD-10-CM | POA: Diagnosis not present

## 2016-09-09 DIAGNOSIS — E669 Obesity, unspecified: Secondary | ICD-10-CM | POA: Diagnosis not present

## 2016-09-09 DIAGNOSIS — J309 Allergic rhinitis, unspecified: Secondary | ICD-10-CM | POA: Diagnosis not present

## 2016-10-07 DIAGNOSIS — F329 Major depressive disorder, single episode, unspecified: Secondary | ICD-10-CM | POA: Diagnosis not present

## 2016-10-07 DIAGNOSIS — R0789 Other chest pain: Secondary | ICD-10-CM | POA: Diagnosis not present

## 2016-10-07 DIAGNOSIS — E559 Vitamin D deficiency, unspecified: Secondary | ICD-10-CM | POA: Diagnosis not present

## 2016-10-07 DIAGNOSIS — E669 Obesity, unspecified: Secondary | ICD-10-CM | POA: Diagnosis not present

## 2016-10-07 DIAGNOSIS — R05 Cough: Secondary | ICD-10-CM | POA: Diagnosis not present

## 2016-10-07 DIAGNOSIS — J309 Allergic rhinitis, unspecified: Secondary | ICD-10-CM | POA: Diagnosis not present

## 2016-10-07 DIAGNOSIS — E785 Hyperlipidemia, unspecified: Secondary | ICD-10-CM | POA: Diagnosis not present

## 2016-10-07 DIAGNOSIS — N760 Acute vaginitis: Secondary | ICD-10-CM | POA: Diagnosis not present

## 2016-10-20 ENCOUNTER — Emergency Department (HOSPITAL_COMMUNITY)
Admission: EM | Admit: 2016-10-20 | Discharge: 2016-10-20 | Disposition: A | Discharge status: Home / Self Care | Payer: Medicare HMO | Attending: Emergency Medicine | Admitting: Emergency Medicine

## 2016-10-20 DIAGNOSIS — N3000 Acute cystitis without hematuria: Secondary | ICD-10-CM | POA: Diagnosis not present

## 2016-10-20 DIAGNOSIS — R05 Cough: Secondary | ICD-10-CM | POA: Diagnosis not present

## 2016-10-20 DIAGNOSIS — R030 Elevated blood-pressure reading, without diagnosis of hypertension: Secondary | ICD-10-CM | POA: Diagnosis not present

## 2016-10-20 DIAGNOSIS — I10 Essential (primary) hypertension: Secondary | ICD-10-CM | POA: Diagnosis not present

## 2016-10-20 DIAGNOSIS — R109 Unspecified abdominal pain: Secondary | ICD-10-CM | POA: Diagnosis present

## 2016-10-20 LAB — URINALYSIS, ROUTINE W REFLEX MICROSCOPIC
Bilirubin Urine: NEGATIVE
Glucose, UA: NEGATIVE mg/dL
Hgb urine dipstick: NEGATIVE
Ketones, ur: NEGATIVE mg/dL
Nitrite: NEGATIVE
Protein, ur: NEGATIVE mg/dL
SPECIFIC GRAVITY, URINE: 1.016 (ref 1.005–1.030)
SQUAMOUS EPITHELIAL / LPF: NONE SEEN
pH: 7 (ref 5.0–8.0)

## 2016-10-20 LAB — CBC WITH DIFFERENTIAL/PLATELET
BASOS PCT: 0 %
Basophils Absolute: 0 10*3/uL (ref 0.0–0.1)
Eosinophils Absolute: 0.1 10*3/uL (ref 0.0–0.7)
Eosinophils Relative: 2 %
HEMATOCRIT: 38.2 % (ref 36.0–46.0)
Hemoglobin: 12.4 g/dL (ref 12.0–15.0)
LYMPHS PCT: 21 %
Lymphs Abs: 1.3 10*3/uL (ref 0.7–4.0)
MCH: 28.2 pg (ref 26.0–34.0)
MCHC: 32.5 g/dL (ref 30.0–36.0)
MCV: 86.8 fL (ref 78.0–100.0)
MONO ABS: 0.6 10*3/uL (ref 0.1–1.0)
MONOS PCT: 10 %
NEUTROS ABS: 4 10*3/uL (ref 1.7–7.7)
Neutrophils Relative %: 67 %
Platelets: 221 10*3/uL (ref 150–400)
RBC: 4.4 MIL/uL (ref 3.87–5.11)
RDW: 13.6 % (ref 11.5–15.5)
WBC: 6 10*3/uL (ref 4.0–10.5)

## 2016-10-20 LAB — BASIC METABOLIC PANEL
Anion gap: 8 (ref 5–15)
BUN: 18 mg/dL (ref 6–20)
CALCIUM: 8.8 mg/dL — AB (ref 8.9–10.3)
CHLORIDE: 109 mmol/L (ref 101–111)
CO2: 22 mmol/L (ref 22–32)
CREATININE: 0.64 mg/dL (ref 0.44–1.00)
GFR calc Af Amer: >60 mL/min (ref >60)
GFR calc non Af Amer: >60 mL/min (ref >60)
GLUCOSE: 85 mg/dL (ref 65–99)
Potassium: 3.8 mmol/L (ref 3.5–5.1)
Sodium: 139 mmol/L (ref 135–145)

## 2016-10-20 MED ORDER — DEXTROSE 5 % IV SOLN
1.0000 g | Freq: Once | INTRAVENOUS | Status: AC
  Administered 2016-10-20: 1 g via INTRAVENOUS
  Filled 2016-10-20: qty 10

## 2016-10-20 MED ORDER — CEPHALEXIN 500 MG PO CAPS: 500.0000 mg | ORAL_CAPSULE | Freq: Four times a day (QID) | ORAL | 0 refills | Status: DC

## 2016-10-20 MED ORDER — SODIUM CHLORIDE 0.9 % IV BOLUS (SEPSIS)
1000.0000 mL | Freq: Once | INTRAVENOUS | Status: AC
  Administered 2016-10-20: 1000 mL via INTRAVENOUS

## 2016-10-20 NOTE — ED Provider Notes (Signed)
[facility] DEPT Provider Note   CSN: [id number] Arrival date & time: 10/20/16  1551     History   Chief Complaint Chief Complaint  Patient presents with  . Abdominal Pain    HPI Tracie Atkins is a 61 y.o. female.  HPI  2 weeks of progressively worsening dysuria, malodorous urine, increased frequency unlike anything she has ever experienced before. Associated with nausea, back pain and decreased energy. Hasn't tried any for the symptoms. No other associated or exacerbating factors.  Past Medical History:  Diagnosis Date  . Depression     There are no active problems to display for this patient.   Past Surgical History:  Procedure Laterality Date  . TUBAL LIGATION      OB History    Gravida Para Term Preterm AB Living   3 3 3     3    SAB TAB Ectopic Multiple Live Births                   Home Medications    Prior to Admission medications   Medication Sig Start Date End Date Taking? Authorizing Provider  cephALEXin (KEFLEX) 500 MG capsule Take 1 capsule (500 mg total) by mouth 4 (four) times daily. 10/20/16   Merrily Pew, MD    Family History No family history on file.  Social History Social History  Substance Use Topics  . Smoking status: Never Smoker  . Smokeless tobacco: Never Used  . Alcohol use 1.2 oz/week    2 Cans of beer per week     Allergies   Patient has no known allergies.   Review of Systems Review of Systems  All other systems reviewed and are negative.    Physical Exam Updated Vital Signs BP 127/85   Pulse 86   Temp 98.5 F (36.9 C) (Oral)   Resp 16   SpO2 98%   Physical Exam  Constitutional: She is oriented to person, place, and time. She appears well-developed and well-nourished.  HENT:  Head: Normocephalic and atraumatic.  Eyes: Conjunctivae and EOM are normal.  Neck: Normal range of motion.  Cardiovascular: Normal rate and regular rhythm.   Pulmonary/Chest: No stridor. No respiratory distress.  Abdominal:  She exhibits no distension. There is tenderness (mild suprapubic).  Musculoskeletal: Normal range of motion. She exhibits no edema or deformity.  Neurological: She is alert and oriented to person, place, and time. No cranial nerve deficit.  Skin: Skin is warm and dry. No erythema. No pallor.  Nursing note and vitals reviewed.    ED Treatments / Results  Labs (all labs ordered are listed, but only abnormal results are displayed) Labs Reviewed  BASIC METABOLIC PANEL - Abnormal; Notable for the following:       Result Value   Calcium 8.8 (*)    All other components within normal limits  URINALYSIS, ROUTINE W REFLEX MICROSCOPIC - Abnormal; Notable for the following:    APPearance CLOUDY (*)    Leukocytes, UA TRACE (*)    Bacteria, UA RARE (*)    All other components within normal limits  URINE CULTURE  CBC WITH DIFFERENTIAL/PLATELET    EKG  EKG Interpretation None       Radiology No results found.  Procedures Procedures (including critical care time)  Medications Ordered in ED Medications  sodium chloride 0.9 % bolus 1,000 mL (0 mLs Intravenous Stopped 10/20/16 1847)  cefTRIAXone (ROCEPHIN) 1 g in dextrose 5 % 50 mL IVPB (0 g Intravenous Stopped 10/20/16 2127)  Initial Impression / Assessment and Plan / ED Course  I have reviewed the triage vital signs and the nursing notes.  Pertinent labs & imaging results that were available during my care of the patient were reviewed by me and considered in my medical decision making (see chart for details).     eval for UTI.  Likely UTI. Improved with fluids and bolus. Stable for dc with keflex and pcp follow up. Doubt pyelonephritis or other systemic disease at this time.   Final Clinical Impressions(s) / ED Diagnoses   Final diagnoses:  Acute cystitis without hematuria    New Prescriptions Discharge Medication List as of 10/20/2016  9:14 PM    START taking these medications   Details  cephALEXin (KEFLEX) 500 MG  capsule Take 1 capsule (500 mg total) by mouth 4 (four) times daily., Starting Mon 10/20/2016, Print         Merrily Pew, MD 10/21/16 1324

## 2016-10-20 NOTE — ED Notes (Signed)
Pt hooked up to monitor.

## 2016-10-20 NOTE — ED Triage Notes (Signed)
Per EMS pt from home increased frequency while urinating, pain and foul smell, pt also c/o HTN and SOB. Pt was at doctor and she stated she found out she has "something on her L heart." Pt when washing herself near perineal area noticed new small lump

## 2016-10-20 NOTE — ED Notes (Signed)
Pt given cranberry juice. Pt tolerating well. Will continue to monitor.

## 2016-10-23 LAB — URINE CULTURE

## 2016-10-24 ENCOUNTER — Telehealth: Payer: Self-pay | Admitting: *Deleted

## 2016-10-24 DIAGNOSIS — R0789 Other chest pain: Secondary | ICD-10-CM | POA: Diagnosis not present

## 2016-10-24 NOTE — Telephone Encounter (Signed)
Post ED Visit - Positive Culture Follow-up  Culture report reviewed by antimicrobial stewardship pharmacist:  []  Elenor Quinones, Pharm.D. []  Heide Guile, Pharm.D., BCPS AQ-ID []  Parks Neptune, Pharm.D., BCPS []  Alycia Rossetti, Pharm.D., BCPS []  [place], Pharm.D., BCPS, AAHIVP []  Legrand Como, Pharm.D., BCPS, AAHIVP []  Salome Arnt, PharmD, BCPS [x]  Dimitri Ped, PharmD, BCPS []  Vincenza Hews, PharmD, BCPS  Positive urine culture Treated with Cephalexin, organism sensitive to the same and no further patient follow-up is required at this time.  Harlon Flor [facility] 10/24/2016, 11:26 AM

## 2016-11-20 ENCOUNTER — Encounter (HOSPITAL_COMMUNITY): Payer: Self-pay

## 2016-11-20 ENCOUNTER — Emergency Department (HOSPITAL_COMMUNITY)
Admission: EM | Admit: 2016-11-20 | Discharge: 2016-11-20 | Disposition: A | Discharge status: Home / Self Care | Payer: Medicare HMO | Attending: Emergency Medicine | Admitting: Emergency Medicine

## 2016-11-20 DIAGNOSIS — F10920 Alcohol use, unspecified with intoxication, uncomplicated: Secondary | ICD-10-CM

## 2016-11-20 DIAGNOSIS — F10129 Alcohol abuse with intoxication, unspecified: Secondary | ICD-10-CM | POA: Diagnosis present

## 2016-11-20 DIAGNOSIS — R45851 Suicidal ideations: Secondary | ICD-10-CM | POA: Insufficient documentation

## 2016-11-20 DIAGNOSIS — F1014 Alcohol abuse with alcohol-induced mood disorder: Secondary | ICD-10-CM | POA: Diagnosis not present

## 2016-11-20 LAB — COMPREHENSIVE METABOLIC PANEL
ALBUMIN: 4.1 g/dL (ref 3.5–5.0)
ALT: 21 U/L (ref 14–54)
AST: 22 U/L (ref 15–41)
Alkaline Phosphatase: 158 U/L — ABNORMAL HIGH (ref 38–126)
Anion gap: 11 (ref 5–15)
BUN: 16 mg/dL (ref 6–20)
CO2: 21 mmol/L — ABNORMAL LOW (ref 22–32)
Calcium: 8.8 mg/dL — ABNORMAL LOW (ref 8.9–10.3)
Chloride: 112 mmol/L — ABNORMAL HIGH (ref 101–111)
Creatinine, Ser: 0.47 mg/dL (ref 0.44–1.00)
GFR calc Af Amer: >60 mL/min (ref >60)
GFR calc non Af Amer: >60 mL/min (ref >60)
Glucose, Bld: 98 mg/dL (ref 65–99)
Potassium: 3.5 mmol/L (ref 3.5–5.1)
SODIUM: 144 mmol/L (ref 135–145)
TOTAL PROTEIN: 7.3 g/dL (ref 6.5–8.1)
Total Bilirubin: 0.3 mg/dL (ref 0.3–1.2)

## 2016-11-20 LAB — CBC
HCT: 41.6 % (ref 36.0–46.0)
Hemoglobin: 13.5 g/dL (ref 12.0–15.0)
MCH: 28 pg (ref 26.0–34.0)
MCHC: 32.5 g/dL (ref 30.0–36.0)
MCV: 86.3 fL (ref 78.0–100.0)
PLATELETS: 229 10*3/uL (ref 150–400)
RBC: 4.82 MIL/uL (ref 3.87–5.11)
RDW: 14.3 % (ref 11.5–15.5)
WBC: 7.4 10*3/uL (ref 4.0–10.5)

## 2016-11-20 LAB — RAPID URINE DRUG SCREEN, HOSP PERFORMED
AMPHETAMINES: NOT DETECTED
Barbiturates: NOT DETECTED
Benzodiazepines: NOT DETECTED
Cocaine: NOT DETECTED
Opiates: NOT DETECTED
Tetrahydrocannabinol: NOT DETECTED

## 2016-11-20 LAB — SALICYLATE LEVEL: Salicylate Lvl: <7 mg/dL (ref 2.8–30.0)

## 2016-11-20 LAB — ETHANOL: ALCOHOL ETHYL (B): 237 mg/dL — AB (ref <5)

## 2016-11-20 LAB — ACETAMINOPHEN LEVEL: Acetaminophen (Tylenol), Serum: <10 ug/mL — ABNORMAL LOW (ref 10–30)

## 2016-11-20 NOTE — ED Notes (Signed)
Bed: [facility] Expected date:  Expected time:  Means of arrival:  Comments: [facility]- IVC 

## 2016-11-20 NOTE — ED Provider Notes (Signed)
[facility] DEPT Provider Note   CSN: [id number] Arrival date & time: 11/20/16  1212     History   Chief Complaint Chief Complaint  Patient presents with  . Suicidal  . Alcohol Problem    HPI Tracie Atkins is a 61 y.o. female.  HPI 61 year old Caucasian female past medical history significant for alcohol abuse, depression presents to the emergency department today by [facility] from home for alcohol intoxication, suicidal ideations, medical clearance. Patient was voluntary transport stating that she was going to kill herself. The patient is intoxicated and unstable on her feet. States that she has drank a bottle of vodka today. States that she does drink every day but unable to quantify the amount. Does have history of depression does not take any medications currently. States that there is a lot of stress in her home right now. She is from [place] and trying to get back to her family in [place] but nobody wants to take her there. She made the comment that she was going to cut her arms today and made motions to slice her arm. The patient has history of suicide attempts in the past by cutting her wrist. Patient is very upset and crying on the stretcher. Her son is in the lobby and the nurse picked him states that there is nobody [place] that will take his mom. Nobody is planning to take her up to [place]. They're concerned since she does have a history of suicide attempts that she may hurt herself and wants her restraint and evaluated. The patient denies any medical complaints at this time including fever, chills, headache, vision changes, chest pain, shortness breath, abdominal pain, urinary symptoms, change in bowel habits, paresthesias. Denies any other medical problems. Denies any regular medications. Patient denies any homicidal ideations. Denies any auditory or visual hallucinations. She denies any illicit drug use or tobacco use. States that her last menstrual period this morning. Past  Medical History:  Diagnosis Date  . Depression     There are no active problems to display for this patient.   Past Surgical History:  Procedure Laterality Date  . TUBAL LIGATION      OB History    Gravida Para Term Preterm AB Living   3 3 3     3    SAB TAB Ectopic Multiple Live Births                   Home Medications    Prior to Admission medications   Medication Sig Start Date End Date Taking? Authorizing Provider  cephALEXin (KEFLEX) 500 MG capsule Take 1 capsule (500 mg total) by mouth 4 (four) times daily. Patient not taking: Reported on 11/20/2016 10/20/16   Mesner, Corene Cornea, MD    Family History History reviewed. No pertinent family history.  Social History Social History  Substance Use Topics  . Smoking status: Never Smoker  . Smokeless tobacco: Never Used  . Alcohol use 1.2 oz/week    2 Cans of beer per week     Allergies   Patient has no known allergies.   Review of Systems Review of Systems  Constitutional: Negative for chills and fever.  HENT: Negative for congestion.   Eyes: Negative for visual disturbance.  Respiratory: Negative for cough and shortness of breath.   Cardiovascular: Negative for chest pain.  Gastrointestinal: Negative for abdominal pain, diarrhea, nausea and vomiting.  Genitourinary: Negative for dysuria, flank pain, frequency, hematuria and urgency.  Musculoskeletal: Negative for back pain.  Skin: Negative for wound.  Neurological: Negative for dizziness, syncope, weakness, light-headedness and headaches.  Psychiatric/Behavioral: Positive for suicidal ideas.     Physical Exam Updated Vital Signs BP 101/65 (BP Location: Left Arm)   Pulse 89   Temp 97.9 F (36.6 C) (Oral)   Resp 18   SpO2 95%   Physical Exam  Constitutional: She is oriented to person, place, and time. She appears well-developed and well-nourished. No distress.  Patient is tearful in the stretcher but in no acute distress. She does not appear clinically  intoxicated. Speaking complete sentences. Maintaining airway. Able to ambulate with normal gait.  HENT:  Head: Normocephalic and atraumatic.  Mouth/Throat: Oropharynx is clear and moist.  Eyes: Conjunctivae and EOM are normal. Pupils are equal, round, and reactive to light. Right eye exhibits no discharge. Left eye exhibits no discharge. No scleral icterus.  Neck: Normal range of motion. Neck supple. No thyromegaly present.  Cardiovascular: Normal rate, regular rhythm, normal heart sounds and intact distal pulses.  Exam reveals no gallop and no friction rub.   No murmur heard. Pulmonary/Chest: Effort normal and breath sounds normal.  Abdominal: Soft. Bowel sounds are normal. She exhibits no distension. There is no tenderness. There is no rebound and no guarding.  Musculoskeletal: Normal range of motion.  Lymphadenopathy:    She has no cervical adenopathy.  Neurological: She is alert and oriented to person, place, and time.  Skin: Skin is warm and dry. Capillary refill takes less than 2 seconds.  Psychiatric: Her speech is slurred (due to alcohol in toxication). She is slowed. Thought content is not paranoid. She exhibits a depressed mood. She expresses suicidal ideation. She expresses no homicidal ideation. She expresses suicidal plans. She expresses no homicidal plans.  Nursing note and vitals reviewed.    ED Treatments / Results  Labs (all labs ordered are listed, but only abnormal results are displayed) Labs Reviewed  COMPREHENSIVE METABOLIC PANEL - Abnormal; Notable for the following:       Result Value   Chloride 112 (*)    CO2 21 (*)    Calcium 8.8 (*)    Alkaline Phosphatase 158 (*)    All other components within normal limits  ETHANOL - Abnormal; Notable for the following:    Alcohol, Ethyl (B) 237 (*)    All other components within normal limits  ACETAMINOPHEN LEVEL - Abnormal; Notable for the following:    Acetaminophen (Tylenol), Serum <10 (*)    All other components  within normal limits  SALICYLATE LEVEL  CBC  RAPID URINE DRUG SCREEN, HOSP PERFORMED    EKG  EKG Interpretation None       Radiology No results found.  Procedures Procedures (including critical care time)  Medications Ordered in ED Medications - No data to display   Initial Impression / Assessment and Plan / ED Course  I have reviewed the triage vital signs and the nursing notes.  Pertinent labs & imaging results that were available during my care of the patient were reviewed by me and considered in my medical decision making (see chart for details).     The patient resents to the emergency department today by GPT for alcohol intoxication and suicidal ideations for medical clearance. Patient with known history of depression and suicide attempt in the past. She does not appear to be spine to internal stimuli. She does appear slightly intoxicated however she is maintaining her airway, speaking complete sentences with a normal gait. Alcohol level was 237. Although her  alcohol level is elevated do not feel that she is clinically intoxicated. Able tolerate by mouth fluids. Patient is very upset. Son is in the lobby and states that he want his mother evaluated for suicidal ideations. Medical screening labs are reassuring. Vital signs are reassuring. I feel the patient can be medically cleared. Does not feel patient needs any further medical management at this time. Disposition pending TTS recommendations. I spoke with TTS who recommends discharge. Patient is clinically sober and able to ambulate with normal gait and tolerated by mouth fluids. He'll say for discharge. The family, to pick patient up.  Final Clinical Impressions(s) / ED Diagnoses   Final diagnoses:  Suicidal thoughts  Alcoholic intoxication without complication ([place])  Alcohol abuse with alcohol-induced mood disorder [facility])    New Prescriptions Current Discharge Medication List       Aaron Edelman 11/20/16 1718    Drenda Freeze, MD 11/22/16 [phone]

## 2016-11-20 NOTE — BH Assessment (Signed)
Assessment Note  Tracie Atkins is an 61 y.o. female. Patient presents to [facility], voluntarily. She was transported to [facility] from her home by EMS. Patient sts she called EMS on herself but doesn't recall what she told them. Per ED notes patient made suicidal remarks. She also gestured that she would cut her wrist. Patient's BAL was 237 upon arrival. She admits that she has a long history of heavy drinking. She hasn't drank anything in 8 months until this past Saturday. She went [place] the beach with her family to celebrate her sons birthday and admits that she drank to much. She drank again this morning to the point of passing out. She doesn't recall making any suicidal statements. She denies suicidal thoughts at the time of the TTS assessment. She does admit to a prior history of cutting herself (2 yrs ago). The prior event was triggered by the death of her boyfriend. No HI. No AVH's. No drug use reported. Patient received treatment at a mental health facility in [place] 2 yrs ago for depression and a suicide attempt.   Sts that she moved to [facility] from [place] 8 months ago for a "fresh start".  Patient has her own place and lives independently. Patient stating that she doesn't like it in [place] and plans to return back to [place] tomorrow. Sts she has plan to live in a hotel once she returns back to [place]. 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 She made arrangements to return back to the hotel and work her same job. She is also upset that since living in [place] her disability check was decreased. She doesn't feel that she can make it on her readjusted income.    Diagnosis: Major Depressive Disorder, Recurrent, Severe, without psychotic features; Alcohol Use Disorder  Past Medical History:  Past Medical History:  Diagnosis Date  . Depression     Past Surgical History:  Procedure Laterality Date  . TUBAL LIGATION      Family History: History reviewed. No pertinent family history.  Social  History:  reports that she has never smoked. She has never used smokeless tobacco. She reports that she drinks about 1.2 oz of alcohol per week . She reports that she does not use drugs.  Additional Social History:     CIWA: CIWA-Ar BP: 123/71 Pulse Rate: 84 Nausea and Vomiting: no nausea and no vomiting Tactile Disturbances: none Tremor: no tremor Auditory Disturbances: not present Paroxysmal Sweats: barely perceptible sweating, palms moist Visual Disturbances: not present Anxiety: mildly anxious Headache, Fullness in Head: none present Agitation: normal activity Orientation and Clouding of Sensorium: oriented and can do serial additions CIWA-Ar Total: 2 COWS:    Allergies: No Known Allergies  Home Medications:  (Not in a hospital admission)  OB/GYN Status:  No LMP recorded. Patient is postmenopausal.  General Assessment Data Location of Assessment: [facility] ED TTS Assessment: In system Is this a Tele or Face-to-Face Assessment?: Face-to-Face Is this an Initial Assessment or a Re-assessment for this encounter?: Initial Assessment Marital status: Single Maiden name:  (n/a) Is patient pregnant?: No Pregnancy Status: No Living Arrangements: Alone (moved from [place] 8 months ago ) Can pt return to current living arrangement?: No Admission Status: Voluntary Is patient capable of signing voluntary admission?: Yes Referral Source: Self/Family/Friend Insurance type:  [facility] [employer])     Crisis Care Plan Living Arrangements: Alone (moved from [place] 8 months ago ) Legal Guardian: Other: (no legal guardian ) Name of  Psychiatrist:  (no psychiatrist ) Name of Therapist:  (no therapist )  Education Status Is patient currently in school?: No Current Grade:  (n/a) Highest grade of school patient has completed:  (unk) Name of school:  (n/a) Contact person:  (n/a)  Risk to self with the past 6 months Suicidal Ideation: No-Not Currently/Within Last 6 Months Has patient been a risk to  self within the past 6 months prior to admission? : No Suicidal Intent: No-Not Currently/Within Last 6 Months Has patient had any suicidal intent within the past 6 months prior to admission? : No Is patient at risk for suicide?: No Suicidal Plan?: No-Not Currently/Within Last 6 Months Has patient had any suicidal plan within the past 6 months prior to admission? : No Access to Means: No What has been your use of drugs/alcohol within the last 12 months?:  (alcohol ) Previous Attempts/Gestures: Yes (patient made gestures to cut her wrist ) How many times?:  (1-2'x- overdose/cut arm ) Other Self Harm Risks:  (denies ) Triggers for Past Attempts: Other (Comment) (death of boyfriend; last attempt 2 yrs ago ) Intentional Self Injurious Behavior: None Family Suicide History: Unknown Recent stressful life event(s): Other (Comment) ("I moved here from [place] 8 months ago.[place] Kitchen[place]land Kitchen[place] hate it") Persecutory voices/beliefs?: No Depression: Yes Depression Symptoms: Loss of interest in usual pleasures, Feeling angry/irritable, Fatigue Substance abuse history and/or treatment for substance abuse?: No Suicide prevention information given to non-admitted patients: Not applicable  Risk to Others within the past 6 months Homicidal Ideation: No Does patient have any lifetime risk of violence toward others beyond the six months prior to admission? : No Thoughts of Harm to Others: No Current Homicidal Intent: No Current Homicidal Plan: No Access to Homicidal Means: No Identified Victim:  (n/a) History of harm to others?: No Assessment of Violence: None Noted Violent Behavior Description:  (patient is calm and cooprative ) Does patient have access to weapons?: No Criminal Charges Pending?: No Does patient have a court date: No Is patient on probation?: No  Psychosis Hallucinations: None noted Delusions: None noted  Mental Status Report Appearance/Hygiene: Disheveled, Body odor, Poor hygiene (in street clothing  ) Eye Contact: Good Motor Activity: Restlessness Speech: Logical/coherent Level of Consciousness: Alert Mood: Depressed Affect: Appropriate to circumstance Anxiety Level: Minimal Thought Processes: Coherent, Relevant Judgement: Unimpaired Orientation: Person, Place, Time Obsessive Compulsive Thoughts/Behaviors: None  Cognitive Functioning Concentration: Decreased Memory: Recent Intact, Remote Intact IQ: Average Insight: Fair Impulse Control: Fair Appetite: Fair Weight Loss:  (none reported) Weight Gain:  (none reported) Sleep: Decreased Total Hours of Sleep:  (varies) Vegetative Symptoms: None  ADLScreening [facility] Assessment Services) Patient's cognitive ability adequate to safely complete daily activities?: Yes Patient able to express need for assistance with ADLs?: Yes Independently performs ADLs?: Yes (appropriate for developmental age)  Prior Inpatient Therapy Prior Inpatient Therapy: Yes Prior Therapy Dates:  (2 yrs ago ) Prior Therapy Facilty/Provider(s):  (facility in [place] ) Reason for Treatment:  (depression and substance abuse )  Prior Outpatient Therapy Prior Outpatient Therapy: No Prior Therapy Dates:  (n/a) Prior Therapy Facilty/Provider(s):  (n/a) Reason for Treatment:  (n/a) Does patient have an ACCT team?: No Does patient have Intensive In-House Services?  : No Does patient have [facility] services? : No Does patient have P4CC services?: No  ADL Screening (condition at time of admission) Patient's cognitive ability adequate to safely complete daily activities?: Yes Patient able to express need for assistance with ADLs?: Yes Independently performs ADLs?: Yes (appropriate for developmental age)  Advance Directives (For Healthcare) Does Patient Have a Medical Advance Directive?: No Would patient like information on creating a medical advance directive?: No - Patient declined    Additional Information 1:1 In Past 12 Months?: No CIRT  Risk: No Elopement Risk: No Does patient have medical clearance?: Yes     Disposition: Per Waylan Boga, DNP, patient does not meet criteria for INPT treatment. TTS to seek outpatient substance abuse and mental health therapy. Patient sts that she plans to return back to [employer]. Patient sts that she is aware of where to follow up [place].  Disposition Initial Assessment Completed for this Encounter: Yes Disposition of Patient: Other dispositions, Outpatient treatment (Per Waylan Boga, DNP psych cleared, f/u w/ outpatient ) Type of outpatient treatment: Adult, [facility] - Intensive Outpatient  On Site Evaluation by:   Reviewed with Physician:    Waldon Merl 11/20/2016 4:51 PM

## 2016-11-20 NOTE — ED Triage Notes (Signed)
Per [facility], pt from home.  Pt called for voluntary transport stating she was going to kill self.  Pt is intoxicated and unstable on her feet.  Pt made motions that she would slice her arm.  Previous hx of same.

## 2016-11-20 NOTE — [facility] Suicide Risk Assessment (Signed)
Suicide Risk Assessment  Discharge Assessment   [facility] Discharge Suicide Risk Assessment   Principal Problem: Alcohol abuse with alcohol-induced mood disorder [facility]) Discharge Diagnoses:  Patient Active Problem List   Diagnosis Date Noted  . Alcohol abuse with alcohol-induced mood disorder ([place]) [F10.14] 11/20/2016    Priority: High    Total Time spent with patient: 45 minutes  Musculoskeletal: Strength & Muscle Tone: within normal limits Gait & Station: normal Patient leans: N/A  Psychiatric Specialty Exam:   Blood pressure 101/65, pulse 89, temperature 97.9 F (36.6 C), temperature source Oral, resp. rate 18, SpO2 95 %.There is no height or weight on file to calculate BMI.  General Appearance: Casual  Eye Contact::  Good  Speech:  Normal Rate409  Volume:  Normal  Mood:  Irritable  Affect:  Congruent  Thought Process:  Coherent and Descriptions of Associations: Intact  Orientation:  Full (Time, Place, and Person)  Thought Content:  WDL and Logical  Suicidal Thoughts:  No  Homicidal Thoughts:  No  Memory:  Immediate;   Good Recent;   Good Remote;   Good  Judgement:  Fair  Insight:  Fair  Psychomotor Activity:  Normal  Concentration:  Good  Recall:  Good  Fund of Knowledge:Good  Language: Good  Akathisia:  No  Handed:  Right  AIMS (if indicated):     Assets:  Housing Leisure Time Physical Health Resilience Social Support  Sleep:     Cognition: WNL  ADL's:  Intact   Mental Status Per Nursing Assessment::   On Admission:   alcohol abuse with suicidal statement.  On assessment, she denies this and prior attempts.  She is planning to return to [place] to live and continue assistance for her alcohol abuse where she has had it there before.  No suicidal/homicidal ideations, hallucinations, and alcohol/drug withdrawal.  Demographic Factors:  Caucasian  Loss Factors: NA  Historical Factors: NA  Risk Reduction Factors:   Sense of responsibility to family, Living  with another person, especially a relative and Positive social support  Continued Clinical Symptoms:  None   Cognitive Features That Contribute To Risk:  None    Suicide Risk:  Minimal: No identifiable suicidal ideation.  Patients presenting with no risk factors but with morbid ruminations; may be classified as minimal risk based on the severity of the depressive symptoms    Plan Of Care/Follow-up recommendations:  Activity:  as tolerated Diet:  heart healthy diet  Zakkery Dorian, NP 11/20/2016, 4:31 PM

## 2016-11-20 NOTE — ED Notes (Signed)
Tolerated oral fluids well.  Waiting for family to come pick her up.

## 2016-11-29 ENCOUNTER — Emergency Department (HOSPITAL_COMMUNITY)
Admission: EM | Admit: 2016-11-29 | Discharge: 2016-11-29 | Disposition: A | Discharge status: Home / Self Care | Payer: Medicare HMO | Attending: Emergency Medicine | Admitting: Emergency Medicine

## 2016-11-29 ENCOUNTER — Encounter (HOSPITAL_COMMUNITY): Payer: Self-pay

## 2016-11-29 DIAGNOSIS — Z79899 Other long term (current) drug therapy: Secondary | ICD-10-CM | POA: Diagnosis not present

## 2016-11-29 DIAGNOSIS — F10120 Alcohol abuse with intoxication, uncomplicated: Secondary | ICD-10-CM | POA: Diagnosis not present

## 2016-11-29 DIAGNOSIS — F10920 Alcohol use, unspecified with intoxication, uncomplicated: Secondary | ICD-10-CM

## 2016-11-29 DIAGNOSIS — F10220 Alcohol dependence with intoxication, uncomplicated: Secondary | ICD-10-CM | POA: Diagnosis not present

## 2016-11-29 DIAGNOSIS — F1010 Alcohol abuse, uncomplicated: Secondary | ICD-10-CM

## 2016-11-29 DIAGNOSIS — F329 Major depressive disorder, single episode, unspecified: Secondary | ICD-10-CM | POA: Diagnosis present

## 2016-11-29 DIAGNOSIS — Y906 Blood alcohol level of 120-199 mg/100 ml: Secondary | ICD-10-CM | POA: Diagnosis not present

## 2016-11-29 LAB — COMPREHENSIVE METABOLIC PANEL
ALK PHOS: 144 U/L — AB (ref 38–126)
ALT: 18 U/L (ref 14–54)
AST: 19 U/L (ref 15–41)
Albumin: 4.1 g/dL (ref 3.5–5.0)
Anion gap: 8 (ref 5–15)
BUN: 15 mg/dL (ref 6–20)
CALCIUM: 9 mg/dL (ref 8.9–10.3)
CO2: 22 mmol/L (ref 22–32)
CREATININE: 0.56 mg/dL (ref 0.44–1.00)
Chloride: 111 mmol/L (ref 101–111)
GFR calc non Af Amer: >60 mL/min (ref >60)
Glucose, Bld: 115 mg/dL — ABNORMAL HIGH (ref 65–99)
Potassium: 3.7 mmol/L (ref 3.5–5.1)
Sodium: 141 mmol/L (ref 135–145)
Total Bilirubin: 0.2 mg/dL — ABNORMAL LOW (ref 0.3–1.2)
Total Protein: 7.4 g/dL (ref 6.5–8.1)

## 2016-11-29 LAB — RAPID URINE DRUG SCREEN, HOSP PERFORMED
AMPHETAMINES: NOT DETECTED
Barbiturates: NOT DETECTED
Benzodiazepines: NOT DETECTED
Cocaine: NOT DETECTED
OPIATES: NOT DETECTED
TETRAHYDROCANNABINOL: NOT DETECTED

## 2016-11-29 LAB — CBC
HCT: 41.8 % (ref 36.0–46.0)
HEMOGLOBIN: 13.8 g/dL (ref 12.0–15.0)
MCH: 28.6 pg (ref 26.0–34.0)
MCHC: 33 g/dL (ref 30.0–36.0)
MCV: 86.5 fL (ref 78.0–100.0)
Platelets: 251 10*3/uL (ref 150–400)
RBC: 4.83 MIL/uL (ref 3.87–5.11)
RDW: 14.5 % (ref 11.5–15.5)
WBC: 8.8 10*3/uL (ref 4.0–10.5)

## 2016-11-29 LAB — ETHANOL: Alcohol, Ethyl (B): 154 mg/dL — ABNORMAL HIGH (ref <5)

## 2016-11-29 LAB — ACETAMINOPHEN LEVEL: Acetaminophen (Tylenol), Serum: <10 ug/mL — ABNORMAL LOW (ref 10–30)

## 2016-11-29 LAB — SALICYLATE LEVEL

## 2016-11-29 NOTE — ED Notes (Signed)
MD at bedside. 

## 2016-11-29 NOTE — Discharge Instructions (Signed)
Call any of the numbers to get help with your drinking problem. If you have any thought of harming yourself call 911 immediately

## 2016-11-29 NOTE — ED Notes (Signed)
Changed into wine colored scrubs.  Bagged: red top, white tank, Tracie Atkins sweats with white lined down the side and Tracie Atkins slip on shoes.

## 2016-11-29 NOTE — ED Provider Notes (Signed)
[facility] DEPT Provider Note   CSN: [id number] Arrival date & time: 11/29/16  1843   Level V caveat psychiatric complaint  History   Chief Complaint Chief Complaint  Patient presents with  . Alcohol Problem    HPI Tracie Atkins is a 61 y.o. female.Patient has been drinking alcohol today as she is depressed that she can't get along with family members. She remarked that she wanted to walk out in front of a car. She wants to leave [place] and go back to [place],  [place].  HPI  Past Medical History:  Diagnosis Date  . Depression     Patient Active Problem List   Diagnosis Date Noted  . Alcohol abuse with alcohol-induced mood disorder ([place]) 11/20/2016    Past Surgical History:  Procedure Laterality Date  . TUBAL LIGATION      OB History    Gravida Para Term Preterm AB Living   3 3 3     3    SAB TAB Ectopic Multiple Live Births                   Home Medications    Prior to Admission medications   Medication Sig Start Date End Date Taking? Authorizing Provider  cephALEXin (KEFLEX) 500 MG capsule Take 1 capsule (500 mg total) by mouth 4 (four) times daily. Patient not taking: Reported on 11/20/2016 10/20/16   Mesner, Corene Cornea, MD    Family History No family history on file.  Social History Social History  Substance Use Topics  . Smoking status: Never Smoker  . Smokeless tobacco: Never Used  . Alcohol use 1.2 oz/week    2 Cans of beer per week   No illicit drug  Allergies   Patient has no known allergies.   Review of Systems Review of Systems  Unable to perform ROS: Psychiatric disorder  Psychiatric/Behavioral: Positive for dysphoric mood and suicidal ideas.     Physical Exam Updated Vital Signs BP (!) 140/115   Pulse (!) 114   Temp 98.7 F (37.1 C) (Oral)   Resp 18   SpO2 92%   Physical Exam  Constitutional: She is oriented to person, place, and time. She appears well-developed and well-nourished.  HENT:  Head: Normocephalic  and atraumatic.  Eyes: Conjunctivae are normal. Pupils are equal, round, and reactive to light.  Neck: Neck supple. No tracheal deviation present. No thyromegaly present.  Cardiovascular: Normal rate and regular rhythm.   No murmur heard. Mildly tachycardic. Pulse counted at 10 4 bpm by me  Pulmonary/Chest: Effort normal and breath sounds normal.  Abdominal: Soft. Bowel sounds are normal. She exhibits no distension. There is no tenderness.  Musculoskeletal: Normal range of motion. She exhibits no edema or tenderness.  Neurological: She is alert and oriented to person, place, and time. Coordination normal.  Skin: Skin is warm and dry. No rash noted.  Psychiatric:  Argumentative.  Nursing note and vitals reviewed.    ED Treatments / Results  Labs (all labs ordered are listed, but only abnormal results are displayed) Labs Reviewed  CBC  COMPREHENSIVE METABOLIC PANEL  ETHANOL  SALICYLATE LEVEL  ACETAMINOPHEN LEVEL  RAPID URINE DRUG SCREEN, HOSP PERFORMED    EKG  EKG Interpretation None      Results for orders placed or performed during the hospital encounter of 11/29/16  Comprehensive metabolic panel  Result Value Ref Range   Sodium 141 135 - 145 mmol/L   Potassium 3.7 3.5 - 5.1 mmol/L  Chloride 111 101 - 111 mmol/L   CO2 22 22 - 32 mmol/L   Glucose, Bld 115 (H) 65 - 99 mg/dL   BUN 15 6 - 20 mg/dL   Creatinine, Ser 0.56 0.44 - 1.00 mg/dL   Calcium 9.0 8.9 - 10.3 mg/dL   Total Protein 7.4 6.5 - 8.1 g/dL   Albumin 4.1 3.5 - 5.0 g/dL   AST 19 15 - 41 U/L   ALT 18 14 - 54 U/L   Alkaline Phosphatase 144 (H) 38 - 126 U/L   Total Bilirubin 0.2 (L) 0.3 - 1.2 mg/dL   GFR calc non Af Amer >60 >60 mL/min   GFR calc Af Amer >60 >60 mL/min   Anion gap 8 5 - 15  Ethanol  Result Value Ref Range   Alcohol, Ethyl (B) 154 (H) <5 mg/dL  Salicylate level  Result Value Ref Range   Salicylate Lvl <7.2 2.8 - 30.0 mg/dL  Acetaminophen level  Result Value Ref Range   Acetaminophen  (Tylenol), Serum <10 (L) 10 - 30 ug/mL  cbc  Result Value Ref Range   WBC 8.8 4.0 - 10.5 K/uL   RBC 4.83 3.87 - 5.11 MIL/uL   Hemoglobin 13.8 12.0 - 15.0 g/dL   HCT 41.8 36.0 - 46.0 %   MCV 86.5 78.0 - 100.0 fL   MCH 28.6 26.0 - 34.0 pg   MCHC 33.0 30.0 - 36.0 g/dL   RDW 14.5 11.5 - 15.5 %   Platelets 251 150 - 400 K/uL  Rapid urine drug screen (hospital performed)  Result Value Ref Range   Opiates NONE DETECTED NONE DETECTED   Cocaine NONE DETECTED NONE DETECTED   Benzodiazepines NONE DETECTED NONE DETECTED   Amphetamines NONE DETECTED NONE DETECTED   Tetrahydrocannabinol NONE DETECTED NONE DETECTED   Barbiturates NONE DETECTED NONE DETECTED   No results found.  Radiology No results found.  Procedures Procedures (including critical care time)  Medications Ordered in ED Medications - No data to display   Initial Impression / Assessment and Plan / ED Course  I have reviewed the triage vital signs and the nursing notes.  Pertinent labs & imaging results that were available during my care of the patient were reviewed by me and considered in my medical decision making (see chart for details).     Patient committed involuntarily by me, [facility] and first exam form filled by me as patient was attempting to leave the hospital.   At 8:25 PM patient reports she is not feeling suicidal, I just said that cause I drank two colt 45s today, and I was drunk. Patient is sitting quietly, Eeating a sandwuich and appears in no distrress 10:20 PM patient is alert Glasgow Coma Score 15 clinically sober. Vehemently denies wanting to harm herself. Involuntary commitment was rescinded by me. She'll be given resource for alcohol abuse. Instructed to call 911 if she has any thought of harming herself Final Clinical Impressions(s) / ED Diagnoses  Diagnosis alcohol intoxication Final diagnoses:  None    New Prescriptions New Prescriptions   No medications on file     Orlie Dakin,  MD 11/30/16 [phone]

## 2016-11-29 NOTE — ED Notes (Signed)
Bed: [facility]PT3 Expected date:  Expected time:  Means of arrival:  Comments: 

## 2016-11-29 NOTE — ED Notes (Signed)
Pt refused vitals 

## 2016-11-29 NOTE — ED Notes (Signed)
Patient attempting to leave. MD made aware.

## 2016-11-29 NOTE — ED Triage Notes (Signed)
She phoned the police, telling them that she "want to kill myself". She is in no distress and smells heavily of ETOH.

## 2016-12-08 DIAGNOSIS — F4325 Adjustment disorder with mixed disturbance of emotions and conduct: Secondary | ICD-10-CM | POA: Diagnosis not present

## 2016-12-08 DIAGNOSIS — R45851 Suicidal ideations: Secondary | ICD-10-CM | POA: Diagnosis not present

## 2016-12-08 DIAGNOSIS — F10929 Alcohol use, unspecified with intoxication, unspecified: Secondary | ICD-10-CM | POA: Diagnosis not present

## 2016-12-08 DIAGNOSIS — F688 Other specified disorders of adult personality and behavior: Secondary | ICD-10-CM | POA: Diagnosis not present

## 2016-12-09 DIAGNOSIS — F10929 Alcohol use, unspecified with intoxication, unspecified: Secondary | ICD-10-CM | POA: Diagnosis not present

## 2016-12-09 DIAGNOSIS — F329 Major depressive disorder, single episode, unspecified: Secondary | ICD-10-CM | POA: Diagnosis not present

## 2016-12-09 DIAGNOSIS — F688 Other specified disorders of adult personality and behavior: Secondary | ICD-10-CM | POA: Diagnosis not present

## 2016-12-09 DIAGNOSIS — F1490 Cocaine use, unspecified, uncomplicated: Secondary | ICD-10-CM | POA: Diagnosis not present

## 2016-12-09 DIAGNOSIS — R45851 Suicidal ideations: Secondary | ICD-10-CM | POA: Diagnosis not present

## 2016-12-09 DIAGNOSIS — F10129 Alcohol abuse with intoxication, unspecified: Secondary | ICD-10-CM | POA: Diagnosis not present

## 2016-12-10 DIAGNOSIS — F1410 Cocaine abuse, uncomplicated: Secondary | ICD-10-CM | POA: Diagnosis not present

## 2016-12-10 DIAGNOSIS — F1490 Cocaine use, unspecified, uncomplicated: Secondary | ICD-10-CM | POA: Diagnosis not present

## 2016-12-10 DIAGNOSIS — N760 Acute vaginitis: Secondary | ICD-10-CM | POA: Diagnosis not present

## 2016-12-10 DIAGNOSIS — F39 Unspecified mood [affective] disorder: Secondary | ICD-10-CM | POA: Diagnosis not present

## 2016-12-10 DIAGNOSIS — K219 Gastro-esophageal reflux disease without esophagitis: Secondary | ICD-10-CM | POA: Diagnosis not present

## 2016-12-10 DIAGNOSIS — Z716 Tobacco abuse counseling: Secondary | ICD-10-CM | POA: Diagnosis not present

## 2016-12-10 DIAGNOSIS — R45851 Suicidal ideations: Secondary | ICD-10-CM | POA: Diagnosis not present

## 2016-12-10 DIAGNOSIS — F10929 Alcohol use, unspecified with intoxication, unspecified: Secondary | ICD-10-CM | POA: Diagnosis not present

## 2016-12-10 DIAGNOSIS — R0602 Shortness of breath: Secondary | ICD-10-CM | POA: Diagnosis not present

## 2016-12-10 DIAGNOSIS — E785 Hyperlipidemia, unspecified: Secondary | ICD-10-CM | POA: Diagnosis not present

## 2016-12-10 DIAGNOSIS — F1010 Alcohol abuse, uncomplicated: Secondary | ICD-10-CM | POA: Diagnosis not present

## 2016-12-10 DIAGNOSIS — F339 Major depressive disorder, recurrent, unspecified: Secondary | ICD-10-CM | POA: Diagnosis not present

## 2016-12-10 DIAGNOSIS — I10 Essential (primary) hypertension: Secondary | ICD-10-CM | POA: Diagnosis not present

## 2016-12-10 DIAGNOSIS — F329 Major depressive disorder, single episode, unspecified: Secondary | ICD-10-CM | POA: Diagnosis not present

## 2016-12-19 DIAGNOSIS — S41109A Unspecified open wound of unspecified upper arm, initial encounter: Secondary | ICD-10-CM | POA: Diagnosis not present

## 2016-12-19 DIAGNOSIS — F1010 Alcohol abuse, uncomplicated: Secondary | ICD-10-CM | POA: Diagnosis not present

## 2016-12-19 DIAGNOSIS — F4325 Adjustment disorder with mixed disturbance of emotions and conduct: Secondary | ICD-10-CM | POA: Diagnosis not present

## 2016-12-19 DIAGNOSIS — F329 Major depressive disorder, single episode, unspecified: Secondary | ICD-10-CM | POA: Diagnosis not present

## 2016-12-19 DIAGNOSIS — Z590 Homelessness: Secondary | ICD-10-CM | POA: Diagnosis not present

## 2016-12-19 DIAGNOSIS — F1410 Cocaine abuse, uncomplicated: Secondary | ICD-10-CM | POA: Diagnosis not present

## 2016-12-19 DIAGNOSIS — F1910 Other psychoactive substance abuse, uncomplicated: Secondary | ICD-10-CM | POA: Diagnosis not present

## 2016-12-25 DIAGNOSIS — F1910 Other psychoactive substance abuse, uncomplicated: Secondary | ICD-10-CM | POA: Diagnosis not present

## 2016-12-25 DIAGNOSIS — K7589 Other specified inflammatory liver diseases: Secondary | ICD-10-CM | POA: Diagnosis not present

## 2016-12-25 DIAGNOSIS — E785 Hyperlipidemia, unspecified: Secondary | ICD-10-CM | POA: Diagnosis not present

## 2016-12-25 DIAGNOSIS — N898 Other specified noninflammatory disorders of vagina: Secondary | ICD-10-CM | POA: Diagnosis not present

## 2016-12-25 DIAGNOSIS — Z658 Other specified problems related to psychosocial circumstances: Secondary | ICD-10-CM | POA: Diagnosis not present

## 2016-12-25 DIAGNOSIS — R05 Cough: Secondary | ICD-10-CM | POA: Diagnosis not present

## 2016-12-25 DIAGNOSIS — R399 Unspecified symptoms and signs involving the genitourinary system: Secondary | ICD-10-CM | POA: Diagnosis not present

## 2016-12-25 DIAGNOSIS — E559 Vitamin D deficiency, unspecified: Secondary | ICD-10-CM | POA: Diagnosis not present

## 2016-12-25 DIAGNOSIS — Z590 Homelessness: Secondary | ICD-10-CM | POA: Diagnosis not present

## 2017-04-30 ENCOUNTER — Inpatient Hospital Stay
Admit: 2017-04-30 | Discharge: 2017-05-06 | Disposition: A | Discharge status: Home / Self Care | Payer: MEDICARE | Attending: Psychiatry | Admitting: Psychiatry

## 2017-04-30 DIAGNOSIS — F332 Major depressive disorder, recurrent severe without psychotic features: Principal | ICD-10-CM

## 2017-04-30 LAB — URINALYSIS W/ RFLX MICROSCOPIC
Bilirubin: NEGATIVE
Blood: NEGATIVE
Glucose: NEGATIVE mg/dL
Ketone: NEGATIVE mg/dL
Nitrites: NEGATIVE
Protein: NEGATIVE mg/dL
Specific gravity: 1.015
Urobilinogen: 0.2 EU/dL
pH (UA): 6

## 2017-04-30 LAB — DRUG SCREEN, URINE
AMPHETAMINES: NEGATIVE
BARBITURATES: NEGATIVE
BENZODIAZEPINES: NEGATIVE
COCAINE: NEGATIVE
METHADONE: NEGATIVE
Methamphetamines: NEGATIVE
OPIATES: NEGATIVE
THC (TH-CANNABINOL): NEGATIVE
TRICYCLICS: NEGATIVE

## 2017-04-30 LAB — CBC WITH AUTOMATED DIFF
ABS. BASOPHILS: 0 10*3/uL (ref 0.0–0.1)
ABS. EOSINOPHILS: 0.1 10*3/uL (ref 0.0–0.2)
ABS. LYMPHOCYTES: 1.4 10*3/uL (ref 1.3–3.4)
ABS. MONOCYTES: 0.5 10*3/uL (ref 0.1–1.0)
ABS. NEUTROPHILS: 3.7 10*3/uL (ref 2.0–8.1)
BASOPHILS: 1 % (ref 0.0–2.5)
EOSINOPHILS: 1 % (ref 0.0–7.0)
HCT: 40.6 % (ref 36.0–48.0)
HGB: 13.2 g/dL (ref 12.0–16.0)
LYMPHOCYTES: 25 % (ref 20.5–51.1)
MCH: 27.8 PG (ref 27.0–31.0)
MCHC: 32.5 g/dL (ref 31.0–37.0)
MCV: 85.7 FL (ref 80.0–100.0)
MONOCYTES: 8 % (ref 0.0–12.0)
MPV: 10 FL — ABNORMAL HIGH (ref 6.5–9.5)
NEUTROPHILS: 65 % (ref 42.2–75.2)
PLATELET: 271 10*3/uL (ref 130–400)
RBC: 4.74 M/uL (ref 4.20–5.40)
RDW: 14.6 % — ABNORMAL HIGH (ref 11.5–14.0)
WBC: 5.7 10*3/uL (ref 4.8–11.0)

## 2017-04-30 LAB — METABOLIC PANEL, COMPREHENSIVE
A-G Ratio: 0.9 — ABNORMAL LOW (ref 1.0–1.5)
ALT (SGPT): 28 U/L (ref 12–78)
AST (SGOT): 23 U/L (ref 15–37)
Albumin: 3.4 g/dL (ref 3.4–5.0)
Alk. phosphatase: 207 U/L — ABNORMAL HIGH (ref 46–116)
Anion gap: 8 mmol/L (ref 4–12)
BUN: 9 mg/dL (ref 7–18)
Bilirubin, total: 0.2 mg/dL (ref 0.2–1.0)
CO2: 28 mmol/L (ref 21–32)
Calcium: 8.7 mg/dL (ref 8.5–10.1)
Chloride: 110 mmol/L — ABNORMAL HIGH (ref 98–107)
Creatinine: 0.68 mg/dL (ref 0.55–1.02)
GFR est AA: >60 mL/min/{1.73_m2} (ref >60)
GFR est non-AA: >60 mL/min/{1.73_m2} (ref >60)
Globulin: 3.7 g/dL (ref 2.8–3.9)
Glucose: 95 mg/dL (ref 74–106)
Potassium: 3.7 mmol/L (ref 3.5–5.1)
Protein, total: 7.1 g/dL (ref 6.4–8.2)
Sodium: 146 mmol/L — ABNORMAL HIGH (ref 136–145)

## 2017-04-30 LAB — URINE MICROSCOPIC

## 2017-04-30 LAB — ETHYL ALCOHOL: ETHYL ALCOHOL, SERUM: 96 MG/DL — ABNORMAL HIGH (ref <5)

## 2017-04-30 MED ORDER — HALOPERIDOL 5 MG TAB
5 mg | ORAL | Status: DC | PRN
Start: 2017-04-30 — End: 2017-05-06

## 2017-04-30 MED ORDER — NICOTINE 21 MG/24 HR DAILY PATCH
21 mg/24 hr | TRANSDERMAL | Status: DC
Start: 2017-04-30 — End: 2017-05-01

## 2017-04-30 MED ORDER — TRAZODONE 50 MG TAB
50 mg | Freq: Every evening | ORAL | Status: DC
Start: 2017-04-30 — End: 2017-05-01

## 2017-04-30 MED ORDER — ACETAMINOPHEN 325 MG TABLET
325 mg | ORAL | Status: DC | PRN
Start: 2017-04-30 — End: 2017-05-06
  Administered 2017-04-30 – 2017-05-05 (×4): via ORAL

## 2017-04-30 MED ORDER — DIPHENHYDRAMINE 50 MG CAP
50 mg | ORAL | Status: DC | PRN
Start: 2017-04-30 — End: 2017-05-06
  Administered 2017-05-01 – 2017-05-06 (×2): via ORAL

## 2017-04-30 MED ORDER — FLU VACCINE QV 2018-19 (36 MOS+)(PF) 60 MCG (15 MCG X 4)/0.5 ML IM SYRINGE
60 mcg (15 mcg x 4)/0.5 mL | INTRAMUSCULAR | Status: AC
Start: 2017-04-30 — End: 2017-05-06
  Administered 2017-05-06: 13:00:00 via INTRAMUSCULAR

## 2017-04-30 MED FILL — PAIN AND FEVER 325 MG TABLET: 325 mg | ORAL | Qty: 2

## 2017-04-30 NOTE — Behavioral Health Treatment Team (Signed)
RN Admission Note      Patient:  Kristen Ward Age:  61 y.o. DOB:  01-21-1956     SEX:  female MRN:  [id number] CSN:  [id number]    04/30/2017  7:13 PM                                                                                                                                                                                   Admissions Legal Status:  9.39       Diagnosis Of:   depression               History of Mental Health Hospitalizations:   Multiple, but does not remember last time admitted.                              Reason for Admission:         Three months ago Kristen Ward moved from [place] to [place]. She had been living near her three adult sons in her own apartment, but after her "check was cut in half" she was no longer able to afford living there. She moved back to this area as it is where she was living before going to [place].     She has been living in a shelter in [place] and after seven months sobriety she relapsed on crack, using once, about two weeks PTA. Just before coming to the hospital she reports she drank "one beer." Feeling hopeless/helpless/suicidal (thoughts to hang herself, but she "couldn't find any rope") she took herself to the [facility] clinic and was BIBA.      She is tired of being homeless and having no one to rely on. She stated she wants to get on medication to help her feelings of hopelessness go away. She also said her one son is coming to the area on 05/15/2017 for a wedding and she plans to return to [place] with him. She said she thinks she can live with him temporarily until she can get herself another apartment.      She currently denies SI and stated she is looking forward to being closer to her children.       Behavior Upon Admission/Afflect:          She was cooperative, calm, maintained eye contact. Affect flat. Tearful at times. She was oriented to the unit and searched by RN Harriett Sine and MHT Heather.      Substance Abuse:   Crack abuse; last used two weeks ago.  U-tox negative. ETOH abuse. BAL 96. Drank one beer prior to coming to ED.  History in-patient substance abuse treatment admissions. No outpatient.    Allergies:  NKDA       Med Compliance:   No    Living Situation:    [facility] [place]     Medical History       Past Medical History:   Diagnosis Date   ??? Arthritis    ??? Depression    ??? Psychiatric disorder           Written By:     Elspeth ChoVirginia Hansen, RN 04/30/2017 7:13 PM

## 2017-04-30 NOTE — Progress Notes (Signed)
04/30/17 1849   AIMS Assessment   Muscles of Facial Expression 0   Lips and Perioral Area 0   Jaw 0   Tongue 0   Upper Extremities 0   Neck, Shoulders, Hip 0   Severity of Abnormal Movements 0   Incapacitation Due to Abnormal Movements 0   Pt's Awareness/Distress of Abnormal Movements 0   Current Problem with Teeth and/or Dentures 0   Pt Usually Wear Dentures 0   MD aware

## 2017-04-30 NOTE — ED Triage Notes (Signed)
Pt presenting here with severe depression and feeling suicidal.  Pt positive for hx of drug and etoh abuse with family issues, doesn't take meds, plans to hang herself

## 2017-04-30 NOTE — Progress Notes (Addendum)
Comprehensive Assessment Form Part 1      Section I - Disposition    The on-call Psychiatrist consulted was Dr. Gordy Levan  The admitting Diagnosis is Depression S/I  Parent/Guardian Name:  The Payor source is   Payor/Plan Subscr DOB Sex Relation Sub. Ins. ID Effective Group Num   1. [place] -[place] ESTEPHANI, POPPER May 24, 1956 Female Self [id number] 04/06/17                                    [address]   2. BLUE CROSS MEDEJAI, SCHUBACH 04-05-1956 Female Self [id number] 04/06/17 [id number]                                   [address]      Section II - Integrated Summary  SUMMARY:  Summary:  The patient is a 61 y.o. year old [place] female brought to the Emergency Department via ambulance and referred by [facility].    SW/Screener Note:    SW/Screener met with the patient at the request of the ED Doctor. Patient came to the ED by way of ambulance from [facility]. Patient reports that she is homeless and was at the [facility]. She had a few dollars, so she brought a 4 loco and was drinking it with someone and said something stupid, she was depressed and said she is tired of living, tired of being homeless and would hurt herself. Patient reports that she can't get it in her head, why her 3-sons treat her so bad. Patient became tearful and reported that she can't find a place to live, she has no support. Patient reports that [facility] told the staff that she threatened S/I, patient was sent to the [facility] where she met with some guy and was sent to [facility]. Patient denies current S/I, reporting that she last felt that way two-days ago. Patient denies H/I, A/V Hallucinations. Patient reports S/I attempt by standing in the middle of the street on [address] in [place] 2-3 months ago. Patient reports being taken to [facility] and then sent to [facility] for 1 week. From [place] patient reports going to Rehab at [facility] in  which she completed. Patient reports that they did not help her with housing and she went back on the streets. Patient reports relapsing 2-weeks ago after being clean for 7 months.  Patient reports self-afflicted cuts on wrist in July, but it was not a S/I, patient reports that she went to [facility] was kept overnight and discharged the next day/ patient reports poor health, complains of knee pain and in her groin area, tingling. Patient reports that she does not have a PCP, she [facility]. Patent reports that she called her son in November 2017 crying and he came from [place] and picked her up and brought her down there. Patient reports returning from [place] in June 2018 as it was too expensive down there and she could not find housing with her income and they had cut her budget so she returned to [place]. Patient reports that her son was suppose to help her, but she believes that their wives won't allow them. Patient reports having 3 adult sons who are married and have children. They all live in [place]. Patient reports  that she has a brother that lives in [place] at [place] who health is failing. She reports that he is in the hospital now and schedule to come home today. Patient reports that her belongings and her medications are at her brother's home. She does not know what medications that she is on other than the trazodone.   Patient presents tearful, depressed, hopeless, helpless, expressed S/I, prior to  Coming to the ED denies H/I, A/V hallucinations  Much support offered    BAL=96  SW/Screener will review the case with the psychiatrist for disposition.     SW/Screener Note Update/Disposition: SW/Screener reviewed the case with Dr. Gordy Levan. Per Dr. Gordy Levan admit to 2W, 9.39  When patient was informed about disposition, she inquired about how long she will be on the unit as her son will be here Nov. 9, 2018. Patent also  concerned about [facility] the [facility]. Patient signed consent. Screener called and left voice mail message.  SrDespina Pole  [phone], - Marcina Millard [phone] (home)    The information is given by the patient and [facility].  The Chief Complaint is S/I  The Precipitant Factors are Depression, Homeless  Previous Hospitalizations: [facility] - 2 months ago, [facility]  The patient has not previously been in restraints.  Current Psychiatrist and/or Case Manager is [employer].    Lethality Assessment:     The potential for suicide noted by the following: ideation and current substance abuse .  The potential for homicide is not noted.  The patient  has not reported access to weapons.   The patient has not been a perpetrator of sexual or physical abuse.    The patient is felt to be at risk for self harm or harm to others.      Section III - Psychosocial  Patient presents with depression, suicidal thoughts/threats and substance abuse. Onset of symptoms was gradual.  Patient states symptoms have been exacerbated by chemical dependency and lack of support.  The patient's appearance shows no evidence of impairment.  The patient's behavior shows no evidence of impairment. The patient is oriented to time, place, person and situation.  Feelings of helplessness and hopelessness are observed by statement of " I am tired of living, being homeless, I don't know why my sons treat me so bad".   The patient's appetite shows no evidence of impairment.    Sleep Pattern  Sleep Pattern: Broken, Difficulty falling asleep, Disturbed/Interrupted sleep  Usual # of Hours of Sleep/Night: (4)  Current # of Hours of Sleep/Night: (4)     The patient speaks [place] as a primary language.  The patient has no communication impairments affecting communication. The highest grade achieved is Grade 12th with the overall quality of school experience being described as ok.   The patient's preference for learning can be described as: can read and write adequately.  The patient's hearing is normal.  The patient's vision is normal.      The patient is single.  The patient lives alone and homeless. The patient does  plan to return home upon discharge.  The patient's source of income comes from social security.  The patient is currently disabled.      The patient's greatest support comes from brother and this person will be involved with the treatment.      The patient has not been in an event described as horrible or outside the realm of ordinary life experience either currently or in  the past.  The patient has not been a victim of sexual/physical abuse.    Legal status is none.  The patient does not have legal issues pending.     Section IV - Substance Abuse  The patient does has a substance abuse problem. The patient does not have current substance abuse treatment providers.    Alcohol  Age First Got High: (30's) (04/30/17 1439)  Frequency of Use: 1-3 times in the last month (04/30/17 1439)  Average Amount Used: (1 4 loco) (04/30/17 1439)  How Long Using at Current Pattern: (has not drank in three weeks until this morning) (04/30/17 1439)  Date of Last Use: 04/30/17 (04/30/17 1439)  Amount of Last Use: (1 can of loco) (04/30/17 1439)  Are You Able to Stop on Your Own: Yes (comment) (04/30/17 1439)  Symptoms: Depressed mood (04/30/17 1439)              Cocaine/Crack  Age First Got High: (40's) (04/30/17 1439)  Pattern of Use in Past Year: (about $20/day) (04/30/17 1439)  Frequency of Use: Daily (04/30/17 1439)  Average Amount Used: (about $20) (04/30/17 1439)  How Long Using at Current Pattern: (3 months) (04/30/17 1439)  Date of Last Use: 04/17/17 (04/30/17 1439)  Amount of Last Use: ($20) (04/30/17 1439)  Are you able to stop on your own: Yes (04/30/17 1439)  Symptoms: Depressed mood (04/30/17 1439)                       Section V - Mental Status Exam    Attitude and Behavior   General Attitude: Cooperative  Affect: Sad, Tearful  Mood: Depressed, Sad  Insight: Fair  Judgement: Intact  Memory : Unable to assess  Thought Content: Blaming self  Hallucinations: None  Delusions: None  Concentration: Fair  Speech Pattern: Unremarkable  Thought Process: Unremarkable  Motor Activity: Unremarkable          Carlena Hurl

## 2017-04-30 NOTE — Other (Signed)
Shift change report given to D. Mickelson RN and J. Blume RN oncoming nurse by M. Verniece Encarnacion RN off going nurse. Report included the following information Kardex, MAR and Recent Results and rounds q2h

## 2017-04-30 NOTE — ED Notes (Signed)
TRANSFER - OUT REPORT:    Verbal report given to Kathlene NovemberMike on Kristen MajorsVivian Ward  [facility]       Report consisted of patient???s Situation, Background, Assessment and   Recommendations(SBAR).     Information from the following report(s) ED Summary was reviewed with the receiving nurse.    Lines:       Opportunity for questions and clarification was provided.      Patient transported with:   security  Pt pain free at discharge.

## 2017-04-30 NOTE — ED Provider Notes (Signed)
The pt arrives to the ed by ambulance, fully awake and alert, [place] [facility].  The pt is calm and cooperative.  The pt states she has not had her meds for 1 month and is homeless and [facility] [place]  She states she told a sister she was depressed and felt suicidal and wanted to hang herself.  She states remote hx of suicide attempts in the past by cutting.  She denies smoking and states she used crack 2 weeks ago and drank 1 beer this am.  She has no other somatic complaints             History reviewed. No pertinent past medical history.    History reviewed. No pertinent surgical history.      History reviewed. No pertinent family history.    Social History     Socioeconomic History   ??? Marital status: Not on file     Spouse name: Not on file   ??? Number of children: Not on file   ??? Years of education: Not on file   ??? Highest education level: Not on file   Social Needs   ??? Financial resource strain: Not on file   ??? Food insecurity - worry: Not on file   ??? Food insecurity - inability: Not on file   ??? Transportation needs - medical: Not on file   ??? Transportation needs - non-medical: Not on file   Occupational History   ??? Not on file   Tobacco Use   ??? Smoking status: Not on file   Substance and Sexual Activity   ??? Alcohol use: Not on file   ??? Drug use: Not on file   ??? Sexual activity: Not on file   Other Topics Concern   ??? Not on file   Social History Narrative   ??? Not on file         ALLERGIES: Patient has no allergy information on record.    Review of Systems   Constitutional: Negative.    HENT: Negative.    Eyes: Negative.    Respiratory: Negative.    Cardiovascular: Negative.    Gastrointestinal: Negative.    Genitourinary: Negative.    Musculoskeletal: Negative.    Skin: Negative.    Neurological: Negative.    Psychiatric/Behavioral: Positive for dysphoric mood and suicidal ideas.        There were no vitals filed for this visit.         Physical Exam   Constitutional: She is oriented to person, place, and time. She appears well-developed and well-nourished.   HENT:   Head: Normocephalic and atraumatic.   Right Ear: External ear normal.   Left Ear: External ear normal.   Nose: Nose normal.   Mouth/Throat: Oropharynx is clear and moist.   Eyes: Conjunctivae and EOM are normal. Pupils are equal, round, and reactive to light.   Neck: Normal range of motion. Neck supple.   Cardiovascular: Normal rate, regular rhythm, normal heart sounds and intact distal pulses.   Pulmonary/Chest: Effort normal and breath sounds normal.   Abdominal: Soft. Bowel sounds are normal.   Musculoskeletal: Normal range of motion.   Neurological: She is alert and oriented to person, place, and time. She has normal reflexes.   Skin: Skin is warm and dry. Capillary refill takes less than 2 seconds.   Psychiatric: Her speech is normal and behavior is normal. Cognition and  memory are normal. She expresses inappropriate judgment. She exhibits a depressed mood. She expresses suicidal ideation. She expresses suicidal plans (wants to hang herself).   Nursing note and vitals reviewed.       MDM  Number of Diagnoses or Management Options     Amount and/or Complexity of Data Reviewed  Clinical lab tests: ordered and reviewed    Risk of Complications, Morbidity, and/or Mortality  Presenting problems: low  Diagnostic procedures: minimal  Management options: minimal    Patient Progress  Patient progress: stable         Procedures  1:42 PM  Visit Vitals  BP 106/53 Comment: repeat   Pulse 93   Temp 98.6 ??F (37 ??C)   Resp 20   Ht '5\' 6"'$  (1.676 m)   Wt 81.6 kg (180 lb)   SpO2 96%   BMI 29.05 kg/m??     3:26 PM  Results for orders placed or performed during the hospital encounter of 16/10/96   METABOLIC PANEL, COMPREHENSIVE   Result Value Ref Range    Sodium 146 (H) 136 - 145 mmol/L    Potassium 3.7 3.5 - 5.1 mmol/L     Chloride 110 (H) 98 - 107 mmol/L    CO2 28 21 - 32 mmol/L    Anion gap 8 4 - 12 mmol/L    Glucose 95 74 - 106 mg/dL    BUN 9 7 - 18 mg/dL    Creatinine 0.68 0.55 - 1.02 mg/dL    GFR est AA >60 >60 ml/min/1.15m    GFR est non-AA >60 >60 ml/min/1.737m   Calcium 8.7 8.5 - 10.1 mg/dL    Bilirubin, total 0.2 0.2 - 1.0 mg/dL    ALT (SGPT) 28 12 - 78 U/L    AST (SGOT) 23 15 - 37 U/L    Alk. phosphatase 207 (H) 46 - 116 U/L    Protein, total 7.1 6.4 - 8.2 g/dL    Albumin 3.4 3.4 - 5.0 g/dL    Globulin 3.7 2.8 - 3.9 g/dL    A-G Ratio 0.9 (L) 1.0 - 1.5     CBC WITH AUTOMATED DIFF   Result Value Ref Range    WBC 5.7 4.8 - 11.0 K/uL    RBC 4.74 4.20 - 5.40 M/uL    HGB 13.2 12.0 - 16.0 g/dL    HCT 40.6 36.0 - 48.0 %    MCV 85.7 80.0 - 100.0 FL    MCH 27.8 27.0 - 31.0 PG    MCHC 32.5 31.0 - 37.0 g/dL    RDW 14.6 (H) 11.5 - 14.0 %    PLATELET 271 130 - 400 K/uL    MPV 10.0 (H) 6.5 - 9.5 FL    NEUTROPHILS 65 42.2 - 75.2 %    LYMPHOCYTES 25 20.5 - 51.1 %    MONOCYTES 8 0.0 - 12.0 %    EOSINOPHILS 1 0.0 - 7.0 %    BASOPHILS 1 0.0 - 2.5 %    ABS. NEUTROPHILS 3.7 2.0 - 8.1 K/UL    ABS. LYMPHOCYTES 1.4 1.3 - 3.4 K/UL    ABS. MONOCYTES 0.5 0.1 - 1.0 K/UL    ABS. EOSINOPHILS 0.1 0.0 - 0.2 K/UL    ABS. BASOPHILS 0.0 0.0 - 0.1 K/UL    DF AUTOMATED     ETHYL ALCOHOL   Result Value Ref Range    ETHYL ALCOHOL, SERUM 96.0 (H) <5 MG/DL     3:28 PM  The pt is  medically stable for [facility] evaluation  The pt was seen by the [facility] social worker who discussed the case with dr gill and the pt will be admitted to [facility]    Dx--major depression, suicidal ideations, alcohol abuse

## 2017-04-30 NOTE — ED Notes (Signed)
Remains unable to void

## 2017-04-30 NOTE — Behavioral Health Treatment Team (Signed)
Patient already had went to bed and able to fall asleep on her own, she was pleasant and appropriate with this writer stated she was comfortable "i'll take the medication tomorrow night thank you"

## 2017-04-30 NOTE — ED Notes (Signed)
Patient homeless since June and bf recently died.  States clean for 8 months

## 2017-05-01 ENCOUNTER — Inpatient Hospital Stay: Payer: MEDICARE

## 2017-05-01 LAB — LIPID PANEL
CHOL/HDL Ratio: 3.9 (ref 0.0–4.5)
Cholesterol, total: 164 mg/dL (ref 0–199)
HDL Cholesterol: 42 mg/dL (ref 40–60)
LDL, calculated: 102.4 MG/DL — ABNORMAL HIGH (ref 0–99)
Triglyceride: 98 mg/dL (ref 0–149)

## 2017-05-01 LAB — HEMOGLOBIN A1C W/O EAG: Hemoglobin A1c: 5.5 % (ref 4.2–6.3)

## 2017-05-01 MED ORDER — GUAIFENESIN 100 MG/5 ML ORAL LIQUID
100 mg/5 mL | ORAL | Status: DC | PRN
Start: 2017-05-01 — End: 2017-05-06

## 2017-05-01 MED ORDER — TRAZODONE 50 MG TAB
50 mg | Freq: Every evening | ORAL | Status: DC
Start: 2017-05-01 — End: 2017-05-02
  Administered 2017-05-02: 01:00:00 via ORAL

## 2017-05-01 MED ORDER — NAPROXEN 250 MG TAB
250 mg | Freq: Two times a day (BID) | ORAL | Status: DC
Start: 2017-05-01 — End: 2017-05-06
  Administered 2017-05-01 – 2017-05-06 (×10): via ORAL

## 2017-05-01 MED ORDER — BUPROPION SR 150 MG TAB
150 mg | Freq: Every day | ORAL | Status: DC
Start: 2017-05-01 — End: 2017-05-04
  Administered 2017-05-02 – 2017-05-04 (×3): via ORAL

## 2017-05-01 MED FILL — NAPROXEN 250 MG TAB: 250 mg | ORAL | Qty: 2

## 2017-05-01 MED FILL — PAIN AND FEVER 325 MG TABLET: 325 mg | ORAL | Qty: 2

## 2017-05-01 MED FILL — DIPHENHYDRAMINE 50 MG CAP: 50 mg | ORAL | Qty: 1

## 2017-05-01 NOTE — H&P (Signed)
[facility]  HISTORY AND PHYSICAL      Adron Beneame:Ward, Kristen  MR#: [id number]  DOB: 31-Dec-1955  ACCOUNT #: [id number]   ADMIT DATE: 04/30/2017    IDENTIFICATION:  A 61 year old female who was referred by [facility] to the emergency room for psych evaluation because she was feeling depressed and making statements to hurt herself.    HISTORY:  The patient has been wandering on the streets and also has been in [facility], and patient has been feeling depressed, helpless, hopeless, and has been making statements that she was tired of living and would hurt herself.  She made these statements in front of [facility], they sent her to [facility] for getting mental health help, but patient continued to make statements of wanting to hurt herself and was tearful, found to be depressed, and was referred to the emergency room at [facility] for further evaluation.  She also reported while she was in the emergency room that she tried to hurt herself or kill herself by standing in the middle of the street on [address] in [place] about 2 months ago and at that time, patient was admitted to [facility] for a week and released to rehab    PAST HISTORY:  The patient, as stated before, has been in [facility] for depression and suicidal gesture, and also she has been to [facility] in the past for depression and suicidal thoughts.  She has been on different medications, but lately, she has not been taking them.     SUBSTANCE ABUSE:  She abuses crack as well as drinks alcohol.  She stated that she used to be a bad alcoholic in the past and has been in detox as well as rehabs.  She further states that she is doing much better with alcohol, but she smokes crack, according to the patient.  Smoker:  She is a nonsmoker.    PERSONAL HISTORY:  Was born normal, normal stated child development.  She is one of 11 siblings.  Patient went up to 12th grade.  Worked  sporadically.  Has been on disability.  Patient has also 3 children, sons who do not care about her, as well as her siblings who do not care about her.  They are in all different states.  The patient's parents are deceased.  Her father was a bad alcoholic, according to her.      PAST MEDICAL HISTORY:  She denies major medical issues.    REVIEW OF SYSTEMS:  From the emergency room is as follows:  HEAD:  Normocephalic, no signs of injury.  EYES:  Pupils equal, reactive to light and accommodation.  Fundi normal.  EAR, NOSE, THROAT:  No congestion, no discharge noted.  NECK:  Supple, no thyroid, no lymph nodes palpable.  HEART:  S1, S2, no murmurs or gallops.  Rhythm regular.  LUNGS:  Clear.  No rales or rhonchi heard.  ABDOMEN:  Protuberant, nontender.  No organomegaly noted.  EXTREMITIES:  Well developed.  No cyanosis, clubbing or edema noted.  SKIN:  Unremarkable.    MENTAL STATUS EXAMINATION:  Alert, oriented to time, place and person, cooperative.  Speech coherent, relevant, low tone and minimal production.  Affect is flat.  Mood is depressed.  Feels helpless, hopeless, and expresses suicidal thoughts of wanting to hang herself.  No hallucinations, delusions or paranoia.  Memory intact.  Judgment and insight are fair.    DIAGNOSES:  Major depression, recurrent, without psychotic features; cocaine  abuse; alcohol abuse.    TREATMENT PLAN:  Patient will be started on antidepressant medications and will be also watched for alcohol withdrawal.  Patient will be seen on a daily basis and do supportive counseling, groups and activities to focus on her depression and suicidality.    ACTIVITY:  As tolerated.    DIET:  Regular.      Barrett Shell, MD       BG/DN  D: 05/01/2017 11:11     T: 05/01/2017 11:37  JOB #: [id number]

## 2017-05-01 NOTE — Progress Notes (Signed)
Please refer to psychiatric assessment done and dictated

## 2017-05-01 NOTE — Progress Notes (Signed)
04/30/17 1849   AIMS Assessment   Muscles of Facial Expression 0   Lips and Perioral Area 0   Jaw 0   Tongue 0   Upper Extremities 0   Neck, Shoulders, Hip 0   Severity of Abnormal Movements 0   Incapacitation Due to Abnormal Movements 0   Pt's Awareness/Distress of Abnormal Movements 0   Current Problem with Teeth and/or Dentures 0   Pt Usually Wear Dentures 0   MD aware I have reviewed the AIMS Scale and agree.   05/01/17  10:25 AM  Kristen Khurana Delane GingerGill, MD

## 2017-05-01 NOTE — Behavioral Health Treatment Team (Signed)
Met with patient and MD to discuss admission and precipitants. Patient is a 61 year old single caucasian female admitted on 9:39 status due to depression with suicidal thoughts. Patient states that she has been feeling depressed for the past two months and that she recently relapsed with crack cocaine and alcohol after seven months sobriety. Patient has been chronically homeless for past two years since the death of her boyfriend to cancer. She has been [facility] earlier in year and then completed rehab at [facility]. She then went down to live near her son in [place] but financially could not afford to stay there. [employer] cut her cheque by $87 .  She states that she left all her belongings there and returned to [facility].  She has a brother who lives in [place] apartment. He is very sick with COPD and "in and out of the hospital all the time".  She now spends days with him in his apartment and then [facility]. Patient states that she is just overwhelmed with her life style and cannot go on.  Patien's son is scheduled to come up to [facility] on November 9 th to attend a wedding and she thinks that she  will go back down there with him. She has three sons and this is the one that she gets on with the best.  Patient appears to have minimal support and limited resources.  She is regretful for recent relapse with alcohol crack cocaine.    Patient present with low mood and flat affect. Patient has significant cough which she reports she has had for over one month.  Patient appears tired and depressed. Consent singed for [facility] and [facility] for aftercare. See psychosocial for history. Continue with supportive approach. Jaiden Dinkins LCSW-R

## 2017-05-01 NOTE — Behavioral Health Treatment Team (Cosign Needed)
The topics of today's groups was goals. The group discussed the types of goals that people should set, including financial, health, personal, education, and career. The group also discussed ten steps to accomplishing goals. Group members each shared at least one personal goal and the steps required to achieve this goal. Group members worked together to to create steps for each others goals. The purpose of this group was for members to learn the reasons to set goals as well as how to set an attainable goal and achieve it.

## 2017-05-01 NOTE — Other (Signed)
Verbal report given to daVyshift through SBAR, Notes, DOC Flowsheet, MARS and Kardex.??Night shift rounds checked Q2H. ??Report given to  Tara Triassi, RN, Nancy Ostergren, RN and Michael Peters, RN by Deborah Mickelson, RN

## 2017-05-01 NOTE — Progress Notes (Signed)
Problem: Suicide/Homicide (Adult/Pediatric)  Goal: *STG: Seeks staff when feelings of self harm or harm towards others arise  Outcome: Progressing Towards Goal  Presently no self harm or harm towards others noted, patient kept a low profile on the unit today, mood affect a little brighter, attended meals, ADL'S good, respond when spoken to, is pleasant on approach. Is generally cooperative with unit routines and making her needs known.

## 2017-05-01 NOTE — Behavioral Health Treatment Team (Signed)
Telephone call to Kristen [facility]: [phone]  Office [phone] [phone] discussed patient status. She advises that patient has been [facility] program which means that patient will be getting an apartment soon. She would have a lot of support.  She also has an appllication in [facility]. Sr. Kristen Ward will come to visit over the weekend. Karna Abed LCSW-R

## 2017-05-01 NOTE — Behavioral Health Treatment Team (Signed)
Meds verified w/ [facility] on 05/01/17 as follows:  Next 6 meds filled on 02/25/17 w/ no refills: Trazodone 150 mg po QHS x 30 Days; Protonix 40 mg po every day x 30 Days; Remeron 45 mg po QHS x 30 Days; Claritan 10 mg po every day x 30 Days; Docusate 100 mg po BID x 30 days; Wellbutrin SR 150 mg po BID x 30 Days.  Filled on 04/25/17: Naproxen 500 mg po BID #15 tabs

## 2017-05-01 NOTE — Behavioral Health Treatment Team (Signed)
Lab drew A1C and Lipid this am

## 2017-05-01 NOTE — Other (Signed)
[]  Hide copied text    []Hover for details       shift change report given to J. Miller RN and C. Bliefernich RN oncoming nurse by M. Taj Arteaga RN offgoing nurse. Report included the following information Kardex, MAR and Recent Results and rounds q2h.

## 2017-05-01 NOTE — Behavioral Health Treatment Team (Signed)
Kristen Ward  Currently residing at [facility]   [address] [place] [address]   [phone] (home)   DOB: 1956-01-02  SS#:  [id number]  DATE OF ADMISSION:  04/30/2017     Payor/Plan Subscr DOB Sex Relation Sub. Ins. ID Effective Group Num   1. [place] -* AZORA, BONZO May 04, 1956 Female Self [id number] 04/06/17                                    [address]   2. BLUE CROSS MEMYISHIA, KASIK 1955-09-14 Female Self [id number] 04/06/17 [id number]                                   [address]         Reason for Admission: Patient admitted on 9:39 status due to depression with suicidal ideation. Patient had gone to staff at [facility] outpatient clinic and they advised her to go to ER at [facility]        Previous Psychiatric Hospitalizations:  [facility] for one week in July 2018 after cutting self  [facility] 05[facility]Dec[facility]2016    HISTORY OF OUTPATIENT PSYCHIATRIC TREATMENT:  FACILITY DATES CONTACT PERSON / PHONE   [facility]                           HISTORY OF ABUSE / TRAUMA (Include any treatment history)   denies:   Abuse/Neglect Admission Screening:  Physical Abuse/Neglect: Denies  Sexual Abuse: Denies  Verbal Abuse: Denies  Other Abuse/Issues: Denies       FAMILY OF ORIGIN:  The patient was born in [place] and raised by both parents  Family history Negative for mental illness and Positive for substance abuse.  Father had history of alcohol abuse. Both parents deceased - mother in 41.  Patient is one of 40 children - has five brothers and five sisters mostly in [place]. [place] and [place] - no contact except one brother in [place].  Father left family for homosexual relationship when she was a child.    HISTORY OF RELATIONSHIPS (Dates of Marriage / Divorce, including domestic partner relationships/ sexual orientation):     The patient is heterosexual and is single  The patient lives alone. The  patient has three adult married sons living in [place] ( two from one relationship). There is not CPS involvement.  The patient does plan to return home upon discharge.  Patient was with significant other since 12/05/1997 and he died from cancer in 12/06/2014. She has been chronically homeless since this time.      DEVELOPMENT / CHILDHOOD EVENTS (list any significant childhood events):      normal      NORMAL DEVELOPMENTAL MILESTONES:  Yes      EDUCATION: Patient's highest level of education includes   12th grade - no diploma.      MILITARY HISTORY:  NO  BRANCH                                                     Between what years:     []   Served in  combat  []   Discharge Type:     []   Used drugs during service;   Type:    []   Disciplinary actions taken;  What kind and reason:      EMPLOYMENT / DAILY ACTIVITIES PATTERNS:                Employment Status:disabled    Previous employment (types of jobs, Occupational hygienistspecial skills, reasons for termination):  Housekeeping at [employer],. 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[place] , worked off the books at [employer] in exchange for room    Future career/vocational goals: none identified          Substance Use History (type, amount, frequency, age at onset, impairment, treatment):  TYPE OF DRUG USED AGE OF 1ST USE AMOUNT & FREQUENCY DATE/AMOUNT OF LAST USE   Have You Consumed Alcohol Over the Past 12 Months: Yes (04/30/17 1439) thirties  Had been sober for seven months - drank four loco beers 04/30/17   Have You Used Tobacco in the Past 30 Days: No (04/30/17 1439) denies     Marijuana: No (04/30/17 1439) denies     Cocaine/Crack: Yes (04/30/17 1439) thirties "I don't; know - I never had to pay for it" Had been sober for seven months - used once two weeks ago   Heroin: No (04/30/17 1439) denies     Benzodiazepines/Barbiturates: No (04/30/17 1439) denies     Hallucinogens: No (04/30/17 1439) denies     Club Drugs: No (04/30/17 1439) denies     Opiates: No (04/30/17 1439) denies      Methadone: No (04/30/17 1439) denies     Inhalants/Other: No (04/30/17 1439) denies         ALCOHOL/SUBSTANCE ABUSE TREATMENT PROGRAMS ATTENDED WITHIN THE PAST THREE YEARS:  Program Name Type of Program Dates Attended Completed Yes/No   [place] [x]  Inpatient   []  Outpatient/IOP  []  Residential    []  Crisis  []  [facility]  []  Detox  []  Other:     July 2018 yes    []  Inpatient   []  Outpatient/IOP  []  Residential    []  Crisis  []  [facility]  []  Detox  []  Other:      []  Inpatient   []  Outpatient/IOP  []  Residential    []  Crisis  []  [facility]  []  Detox  []  Other:      []  Inpatient   []  Outpatient/IOP  []  Residential    []  Crisis  []  [facility]  []  Detox  []  Other:       A.  The AUDIT assessment was completed:  Score  AUDIT Score: 6 (04/30/17 1400).  The Blood Alcohol Level is   ETHYL ALCOHOL, SERUM   Date Value Ref Range Status   04/30/2017 96.0 (H) <5 MG/DL Final       B.  The score/lab result places the patient into the low risk zone of use (0-7).  Patient's low risk did not warrant further discussion.Marland Kitchen.    LEGAL HISTORY:    none    Pending Charge: (Type):     Next Court Date:     ASSISTED OUTPATIENT TREATMENT (AOT):      Does the patient have a court ordered AOT under [place] Law?  NO  (Date of Court Order):     Petitioner:        MEANS OF SUPPORT:      SSI      RACE / ETHNICITY / CULTURE:  Race:   WHITE OR CAUCASIAN        Ethnicity:  What is your ethnic background?:       Cultural:  Are there any cultural issues (ie: Language, Customs, feeling a part of/or feeling different and uncomfortable, Diet, etc.) that might impact your treatment here in either a positive or negative way?:   denies      PRIMARY LANGUAGE:   The patient speaks [place] as a primary language.      English Proficiency:     Good       SPIRITUALITY:  Are you affiliated with any specific religion / spiritual base?   NO PREFERENCE    Do you have any spiritual and /or religious practices / routines we should  know about in order to plan your care and respect your practices?   none    Clinical Assessment of patient:  Patient is a 61 year old caucasian female admitted on 90:39 status due to depression with suicidal thoughts with plan to hang self.  Patient reports that she feels overwhelmed with chronic homeless. Lack of family support and limited income. Patient express remorse for recent relapse and states that sh wants to get help for her depression. Mood depressed with constricted affect.      What does the patient see as their strengths (must list at least 2):  Access to housing/residential stability, Interpersonal/supportive relationships (family, friends, peers) and Awareness of Substance abuse issues      What does the patient see as their limitations:  Financial limitations and Living arrangements    COMMUNITY CONTACTS:   AGENCY NAME/TITLE PHONE   Sara Lee SrMatthew Saras [phone]  Home  [phone]                    Additional Comments:    DISCHARGE PLANNING:    Mental Health / Addictions Treatment:      Housing: return to [place] and then return to [place] with son on November 9th, 2018    Transportation: medicaid taxi    Financial: SSI    Work / School: disabled    Legal: none      Orbie Pyo, LCSW  05/01/2017

## 2017-05-01 NOTE — Progress Notes (Signed)
04/30/17 1849   AIMS Assessment   Muscles of Facial Expression 0   Lips and Perioral Area 0   Jaw 0   Tongue 0   Upper Extremities 0   Neck, Shoulders, Hip 0   Severity of Abnormal Movements 0   Incapacitation Due to Abnormal Movements 0   Pt's Awareness/Distress of Abnormal Movements 0   Current Problem with Teeth and/or Dentures 0   Pt Usually Wear Dentures 0   MD aware

## 2017-05-02 ENCOUNTER — Inpatient Hospital Stay: Admit: 2017-05-02 | Payer: MEDICARE

## 2017-05-02 MED ORDER — QUETIAPINE 25 MG TAB
25 mg | Freq: Every evening | ORAL | Status: DC | PRN
Start: 2017-05-02 — End: 2017-05-06
  Administered 2017-05-03 – 2017-05-06 (×4): via ORAL

## 2017-05-02 MED ORDER — DOCUSATE SODIUM 100 MG CAP
100 mg | Freq: Two times a day (BID) | ORAL | Status: DC
Start: 2017-05-02 — End: 2017-05-06
  Administered 2017-05-03 – 2017-05-06 (×8): via ORAL

## 2017-05-02 MED FILL — DOCUSATE SODIUM 100 MG CAP: 100 mg | ORAL | Qty: 1

## 2017-05-02 MED FILL — NAPROXEN 250 MG TAB: 250 mg | ORAL | Qty: 2

## 2017-05-02 MED FILL — BUPROPION SR 150 MG TAB: 150 mg | ORAL | Qty: 1

## 2017-05-02 MED FILL — TRAZODONE 50 MG TAB: 50 mg | ORAL | Qty: 3

## 2017-05-02 NOTE — Other (Signed)
SHIFT CHANGE REPORT    Verbal report given to: D Weissinger RN,Lisa Zemski RN  using SBAR, Doc Flowsheets, Kardex, MAR and Notes.  Rounds checked Q2H by RN    By: Carol A Bliefernich, RN

## 2017-05-02 NOTE — Progress Notes (Signed)
Problem: Suicide/Homicide (Adult/Pediatric)  Goal: *STG: Remains safe in hospital  Outcome: Progressing Towards Goal  Kristen Ward remains safe on the unit and verbalizes no SI/HI to MD or this Clinical research associatewriter today while meeting.  Goal: *STG: Attends activities and groups  Outcome: Progressing Towards Goal  Attended art group and participated today.  Goal: *STG/LTG: Complies with medication therapy  Outcome: Progressing Towards Goal  Compliant with current med regimen and verbalizes no side effects.  Reports she would like to try Seroquel for improvement in sleep, as it has helped her in the past.  MD to review patient's chart.  Goal: *STG/LTG:  No longer expresses self destructive or suicidal/homicidal thoughts  Outcome: Progressing Towards Goal  Expresses no thoughts of self harm today.

## 2017-05-02 NOTE — Behavioral Health Treatment Team (Signed)
Nursing Note     Patient escorted off of unit with staff for Xray.  Awaiting result.      Written By:     Danielle Dessawn Weissinger, RN 05/02/2017

## 2017-05-02 NOTE — Behavioral Health Treatment Team (Signed)
Nursing Note     Patient with report of no BM x 1 week.  Dr. Jearld LeschLonge contacted and this writer received verbal order with read back for Colace BID.  Entered as ordered.      Written By:     Danielle Dessawn Weissinger, RN 05/02/2017

## 2017-05-02 NOTE — Other (Signed)
Verbal shift change report given to Carol Bliefernich, RN   (oncoming nurse) by Dawn Weissinger, RN   (offgoing nurse). Report included the following information SBAR.     Rounds checked Q2H by RN.

## 2017-05-02 NOTE — Progress Notes (Signed)
Problem: Suicide/Homicide (Adult/Pediatric)  Goal: *STG/LTG: Complies with medication therapy  Outcome: Progressing Towards Goal  Patient compliant with HS medications  Visible, mood brighter  Denies current S/H ideation

## 2017-05-02 NOTE — Progress Notes (Signed)
Problem: Suicide/Homicide (Adult/Pediatric)  Goal: *STG: Remains safe in hospital  Outcome: Progressing Towards Goal  Denies suicidal ideation or intent to harm self this evening.

## 2017-05-02 NOTE — Progress Notes (Signed)
Inpatient Behavioral Health Services  Provider Daily Progress Note      Patient:  Kristen Ward Age:  61 y.o. DOB:  1955/09/15     SEX:  female MRN:  [id number] CSN:  [id number]    Admit Date:  04/30/2017 Attending:  Gerald Stabs, MD    Subjective:  Patient was seen evaluated.She reports better sleep last night(though middle insomnia persists). She  ate breakfast. She denies S/H/I or A/V/H. She denies any problems on the unit and has no S/E to medications.  Nursing staff report no aggression or agitation.She requests Seroquel for sleep.      Current Medications:    Current Facility-Administered Medications   Medication Dose Route Frequency Provider Last Rate Last Dose   ??? buPROPion SR (WELLBUTRIN SR) tablet 150 mg  150 mg Oral DAILY Delane Ginger, Bhupinder, MD       ??? traZODone (DESYREL) tablet 150 mg  150 mg Oral QHS Delane Ginger, Bhupinder, MD   150 mg at 05/01/17 2117   ??? naproxen (NAPROSYN) tablet 500 mg  500 mg Oral BID WITH MEALS Delane Ginger, Bhupinder, MD   500 mg at 05/01/17 1807   ??? guaiFENesin (ROBITUSSIN) 100 mg/5 mL oral liquid 100 mg  100 mg Oral Q4H PRN Gerald Stabs, MD       ??? haloperidol (HALDOL) tablet 5 mg  5 mg Oral Q4H PRN Delane Ginger, Bhupinder, MD       ??? diphenhydrAMINE (BENADRYL) capsule 50 mg  50 mg Oral Q4H PRN Gerald Stabs, MD   50 mg at 05/01/17 1302   ??? acetaminophen (TYLENOL) tablet 650 mg  650 mg Oral Q4H PRN Gerald Stabs, MD   650 mg at 05/01/17 1302   ??? influenza vaccine 2018-19 (36 mos+)(PF) (FLUZONE QUAD) injection 0.5 mL  0.5 mL IntraMUSCular PRIOR TO DISCHARGE Gill, Bhupinder, MD           Compliant with medication:  Yes      Side effects from medications:  No       MENTAL STATUS EXAM:.  INDINGS WITHIN NORMAL LIMITS (WNL) UNLESS OTHERWISE STATED BELOW:    Sensorium  alert   Relations cooperative   Appearance:  Casually dressed   Motor Behavior:  within normal limits   Speech:  non-pressured   Thought Process: within normal limits   Thought Content Not expressing overt delusions    Suicidal ideations none   Homicidal ideations none   Mood:  better   Affect:  constricted   Memory recent  adequate   Memory remote:  adequate   Concentration:  adequate   Abstraction:  abstract   Insight:  fair   Reliability fair   Judgment:  fair          Diagnoses/Impressions:    Psychiatric:    Active Problems:    Depression (04/30/2017) POA: Unknown      Alcohol abuse (04/30/2017) POA: Yes      Suicidal ideations (04/30/2017) POA: Yes                  Visit Vitals  BP 121/71 (BP 1 Location: Right arm, BP Patient Position: Sitting)   Pulse 79   Temp 98.4 ??F (36.9 ??C)   Resp 18   Ht 5\' 6"  (1.676 m)   Wt 88 kg (194 lb)   SpO2 95%   Breastfeeding? No   BMI 31.31 kg/m??       No Known Allergies    No lab exists for component: 24H  Recommendations/Plan:    D/C trazodone and start Seroquel 25mg  prn sleep.                            Lovie MacadamiaAbiola Dequincy Born, MD               05/02/2017          6:08 AM

## 2017-05-03 MED ORDER — ALUM-MAG HYDROXIDE-SIMETH 200 MG-200 MG-20 MG/5 ML ORAL SUSP
200-200-20 mg/5 mL | ORAL | Status: DC | PRN
Start: 2017-05-03 — End: 2017-05-06
  Administered 2017-05-03 – 2017-05-05 (×5): via ORAL

## 2017-05-03 MED FILL — BUPROPION SR 150 MG TAB: 150 mg | ORAL | Qty: 1

## 2017-05-03 MED FILL — MAG-AL PLUS 200 MG-200 MG-20 MG/5 ML ORAL SUSPENSION: 200-200-20 mg/5 mL | ORAL | Qty: 30

## 2017-05-03 MED FILL — NAPROXEN 250 MG TAB: 250 mg | ORAL | Qty: 2

## 2017-05-03 MED FILL — DOCUSATE SODIUM 100 MG CAP: 100 mg | ORAL | Qty: 1

## 2017-05-03 MED FILL — QUETIAPINE 25 MG TAB: 25 mg | ORAL | Qty: 1

## 2017-05-03 NOTE — Behavioral Health Treatment Team (Signed)
Nursing Note     Patient is observed sitting in the dining room, praying and watching church services on the television with peers.      Written By:     Danielle Dessawn Weissinger, RN 05/03/2017     ]

## 2017-05-03 NOTE — Other (Signed)
SHIFT CHANGE REPORT    Verbal report given to: Dawn Weissinger RN, Nancy Ostergren RN  using SBAR, Doc Flowsheets, Kardex, MAR and Notes.    Rounds checked Q2H by RN    By: Carol A Bliefernich, RN

## 2017-05-03 NOTE — Progress Notes (Signed)
Inpatient Behavioral Health Services  Provider Daily Progress Note      Patient:  Kristen Ward Age:  61 y.o. DOB:  12/23/1955     SEX:  female MRN:  [id number] CSN:  [id number]    Admit Date:  04/30/2017 Attending:  Gerald Stabs, MD    Subjective:  Patient was seen evaluated.She slept poorly last night, from stomach upset( she thinks it is probabbly from eating Pizza). She  skipped breakfast. She denies S/H/I or A/V/H. She denies any problems on the unit and has no S/E to medications.  Nursing staff report no aggression or agitation.  Current Medications:    Current Facility-Administered Medications   Medication Dose Route Frequency Provider Last Rate Last Dose   ??? alum-mag hydroxide-simeth (MYLANTA) oral suspension 30 mL  30 mL Oral Q4H PRN Shyteria Lewis, MD   30 mL at 05/03/17 0842   ??? QUEtiapine (SEROquel) tablet 25 mg  25 mg Oral QHS PRN Lovie Macadamia, MD   25 mg at 05/02/17 2104   ??? docusate sodium (COLACE) capsule 100 mg  100 mg Oral BID Lovie Macadamia, MD   100 mg at 05/03/17 0842   ??? buPROPion SR (WELLBUTRIN SR) tablet 150 mg  150 mg Oral DAILY Gerald Stabs, MD   150 mg at 05/03/17 7829   ??? naproxen (NAPROSYN) tablet 500 mg  500 mg Oral BID WITH MEALS Delane Ginger, Bhupinder, MD   500 mg at 05/03/17 5621   ??? guaiFENesin (ROBITUSSIN) 100 mg/5 mL oral liquid 100 mg  100 mg Oral Q4H PRN Gerald Stabs, MD       ??? haloperidol (HALDOL) tablet 5 mg  5 mg Oral Q4H PRN Delane Ginger, Bhupinder, MD       ??? diphenhydrAMINE (BENADRYL) capsule 50 mg  50 mg Oral Q4H PRN Gerald Stabs, MD   50 mg at 05/01/17 1302   ??? acetaminophen (TYLENOL) tablet 650 mg  650 mg Oral Q4H PRN Gerald Stabs, MD   650 mg at 05/01/17 1302   ??? influenza vaccine 2018-19 (36 mos+)(PF) (FLUZONE QUAD) injection 0.5 mL  0.5 mL IntraMUSCular PRIOR TO DISCHARGE Gill, Bhupinder, MD           Compliant with medication:  Yes      Side effects from medications:  No       MENTAL STATUS EXAM:.  INDINGS WITHIN NORMAL LIMITS (WNL) UNLESS OTHERWISE STATED BELOW:     Sensorium  alert   Relations cooperative   Appearance:  Casually dressed   Motor Behavior:  within normal limits   Speech:  non-pressured   Thought Process: within normal limits   Thought Content Not expressing overt delusions   Suicidal ideations none   Homicidal ideations none   Mood:  better   Affect:  constricted   Memory recent  adequate   Memory remote:  adequate   Concentration:  adequate   Abstraction:  abstract   Insight:  fair   Reliability fair   Judgment:  fair          Diagnoses/Impressions:    Psychiatric:    Active Problems:    Depression (04/30/2017) POA: Unknown      Alcohol abuse (04/30/2017) POA: Yes      Suicidal ideations (04/30/2017) POA: Yes                  Visit Vitals  BP 149/85   Pulse 82   Temp 98.2 ??F (36.8 ??C)   Resp 18   Ht 5\' 6"  (1.676  m)   Wt 88 kg (194 lb)   SpO2 97%   Breastfeeding? No   BMI 31.31 kg/m??       No Known Allergies    No lab exists for component: 24H            Recommendations/Plan:    D/C trazodone and start Seroquel 25mg  prn sleep.                            Lovie MacadamiaAbiola Neya Creegan, MD               05/03/2017          6:08 AM

## 2017-05-03 NOTE — Behavioral Health Treatment Team (Signed)
Patient requested Seroquel 25 mg po for sleep @ 2049.

## 2017-05-03 NOTE — Behavioral Health Treatment Team (Signed)
Nursing Note     Kristen Ward reports poor sleep last night due to a burning in her stomach that she attributes to eating pizza yesterday.  She reports that she is feeling better this morning.  She denies suicidal ideation.  No side effects reported to medication.  Supportive therapies to continue.      Written By:     Danielle Dessawn Weissinger, RN 05/03/2017

## 2017-05-03 NOTE — Other (Signed)
Verbal shift change report given to Jeanne Blume, RN  And Mary MacChurch, RN   (oncoming nurse) by Dawn Weissinger, RN   (offgoing nurse). Report included the following information SBAR.     Rounds checked Q2H by RN.

## 2017-05-03 NOTE — Progress Notes (Signed)
Problem: Suicide/Homicide (Adult/Pediatric)  Goal: *STG: Remains safe in hospital  Outcome: Progressing Towards Goal  Patient has been visible on unit. Denies suicidal thoughts, no acts of self harm. Adherent with evening snack and medication. Continue to provide supportive therapy and monitor for safety. Seroquel effective, sleeping on rounds.

## 2017-05-04 MED ORDER — PANTOPRAZOLE 40 MG TAB, DELAYED RELEASE
40 mg | Freq: Every day | ORAL | Status: DC
Start: 2017-05-04 — End: 2017-05-06
  Administered 2017-05-04 – 2017-05-06 (×3): via ORAL

## 2017-05-04 MED ORDER — BUPROPION SR 100 MG TAB
100 mg | Freq: Two times a day (BID) | ORAL | Status: DC
Start: 2017-05-04 — End: 2017-05-06
  Administered 2017-05-04 – 2017-05-06 (×4): via ORAL

## 2017-05-04 MED FILL — PANTOPRAZOLE 40 MG TAB, DELAYED RELEASE: 40 mg | ORAL | Qty: 1

## 2017-05-04 MED FILL — DOCUSATE SODIUM 100 MG CAP: 100 mg | ORAL | Qty: 1

## 2017-05-04 MED FILL — MAG-AL PLUS 200 MG-200 MG-20 MG/5 ML ORAL SUSPENSION: 200-200-20 mg/5 mL | ORAL | Qty: 30

## 2017-05-04 MED FILL — NAPROXEN 250 MG TAB: 250 mg | ORAL | Qty: 2

## 2017-05-04 MED FILL — QUETIAPINE 25 MG TAB: 25 mg | ORAL | Qty: 1

## 2017-05-04 MED FILL — BUPROPION SR 150 MG TAB: 150 mg | ORAL | Qty: 1

## 2017-05-04 MED FILL — BUPROPION SR 100 MG TAB: 100 mg | ORAL | Qty: 1

## 2017-05-04 NOTE — Other (Signed)
Shift Change Report    Verbal shift change report given to:  Nancy Ostergren, RN, Matthew Villardo, RN & Lisa Zemski, RN    (oncoming nurse)    By Jeanne Blume, RN (offgoing nurse) Report included the following information from: SBAR, MAR, NOTES, DOC Flowsheets as well as the Q15 minute Nightshift Rounds as Checked Q2 hour by an RN and safety round check at least Q4hour RN visual rounding of patients.

## 2017-05-04 NOTE — Other (Signed)
Verbal shift change report given to Carol Bliefernich RN & Anita Kusnirak RN  (oncoming nurse) by Nancy Ostergren RN (offgoing nurse). Report included the following information VS stable, MAR, Recent Results & SBAR.     Rounds book checked Q2 hrs by Charge RN.

## 2017-05-04 NOTE — Progress Notes (Signed)
Problem: Falls - Risk of  Goal: *Absence of Falls  Document Schmid Fall Risk and appropriate interventions in the flowsheet.  Outcome: Progressing Towards Goal  Fall Risk Interventions:     Kristen Ward has not fallen. She wears appropriate footwear and her gait is steady. Requested and received mylanta 30 ml for indigestion and seroquel 25 mg po at 2054. No further complaints. Safety maintained.

## 2017-05-04 NOTE — Progress Notes (Signed)
Psychiatric Progress Note  Date: 05/04/2017  Account Number: [id number]  Name: Kristen Ward & M PROGRESS NOTE:   Coordinated treatment team rounds conducted with psychiatrist, patient, nurses and/or social worker present ; discussions held with case manager and/or family members; Chart reviewed in full including consultant notes, ancillary staff notes, vitals and labs in [facility] EMR reviewed in full.      SUBJECTIVE: she is less depressed and has been attending the groups and activities . denies suicidal thoughts and sleeping better still feels helpless and has moderate  psychomotor retardation increase Wellbutrin to 100 mg po bid    Current Symptoms:     Aggressive []   Threatening []   Loud []   Assaultive []   Inapropriate []   Suspicious []   Paranoid []   Hyperactive []   Hyper verbal []   Flight of ideas  []   Grandiose []   Irritable []  Sad []   Depressed  [x]   Helplessness  [x]   Hopelessness []  Insomnia [x]  Impulsive []    Flat []   Constricted [x]  Tearful []   Agitated []  Isolated [x]   Withdrawn []   Disheveled []    Suicidal []  Homicidal []  Anxiety [x]  Manic []  Thought Disorder []  Well Dressed []   Mood swings  []  Disorganized thoughts  []  Illogical thoughts  []   Anhedonia  []  Poor Apatite  []  Tiredness  [x]  Looseness of Association  []  Demanding  []      OBJECTIVE:   Mental Status Examination:  Cognition and Memory Intact [x]  Alert Ambulatory and Oriented x3 [x]    Judgment- Fair [x]  Poor []     Insight- Fair [x]   Poor []     Delusions- Present []   Absent [x]   Halucinations- Present []   Absent [x]    No Side effects [x]  No EPS /AIMS Noted  [x]      Pertinant Data:  Patient Vitals for the past 8 hrs:   Temp Pulse BP SpO2   05/04/17 0632 97.9 ??F (36.6 ??C) 92 137/86 97 %       No results found for this or any previous visit (from the past 24 hour(s)).    Medications:  Current Facility-Administered Medications   Medication Dose Route Frequency   ??? buPROPion SR (WELLBUTRIN SR) tablet 100 mg  100 mg Oral BID    ??? alum-mag hydroxide-simeth (MYLANTA) oral suspension 30 mL  30 mL Oral Q4H PRN   ??? QUEtiapine (SEROquel) tablet 25 mg  25 mg Oral QHS PRN   ??? docusate sodium (COLACE) capsule 100 mg  100 mg Oral BID   ??? naproxen (NAPROSYN) tablet 500 mg  500 mg Oral BID WITH MEALS   ??? guaiFENesin (ROBITUSSIN) 100 mg/5 mL oral liquid 100 mg  100 mg Oral Q4H PRN   ??? haloperidol (HALDOL) tablet 5 mg  5 mg Oral Q4H PRN   ??? diphenhydrAMINE (BENADRYL) capsule 50 mg  50 mg Oral Q4H PRN   ??? acetaminophen (TYLENOL) tablet 650 mg  650 mg Oral Q4H PRN   ??? influenza vaccine 2018-19 (36 mos+)(PF) (FLUZONE QUAD) injection 0.5 mL  0.5 mL IntraMUSCular PRIOR TO DISCHARGE      Scheduled Medications:   Current Facility-Administered Medications   Medication Dose Route Frequency   ??? buPROPion SR (WELLBUTRIN SR) tablet 100 mg  100 mg Oral BID   ??? docusate sodium (COLACE) capsule 100 mg  100 mg Oral BID   ??? naproxen (NAPROSYN) tablet 500 mg  500 mg Oral BID WITH MEALS   ??? influenza vaccine 2018-19 (36 mos+)(PF) (FLUZONE QUAD)  injection 0.5 mL  0.5 mL IntraMUSCular PRIOR TO DISCHARGE       ASSESSMENT/PLAN:                Improving Resolved Same Worse N/A     Depression   [x]      []      []      []      []        Mania/Hypomania   []      []      []      []      []        Anxiety   [x]      []      []      []      []        Psychosis   []      []      []      []      []        Agitation/Aggression   []      []      []      []      []        Insomnia   [x]      []      []      []      []       Confusion/Disorganization   []     []      []      []     []     Hallucination  []    []    []    []    []     Delusions  []    []    []    []    []     Paranoia  []    []    []    []    []     Hopelessness  [x]    []    []    []    []     Appetite  []    []    []    []    []     Judgment  [x]    []    []    []    []     Insight  [x]    []    []    []    []     Suicidal thoughts  []    [x]    []    []    []     Homicidal Thoughts  []    []    []    []    []           Patient's overall response to treatment:      []    Positive : mild/moderate / significant    [x]    Partial Change    []    No Change    []   Close to baseline     Reason for continued hospitalization:      [x]    Further stabilization needed    []    Unsafe to self/others    [x]    Not at baseline    []   Needs intensive inpatient treatment still    []   At high risk of relapse if discharged at this time     Diagnoses:    Patient Active Hospital Problem List:   Depression (04/30/2017)    Assessment:  Less depressed     Plan:  Increase Wellbutrin to 100 mg po bid   Alcohol abuse (04/30/2017)    Assessment:  No withdrawal at present    Plan:  Will be followed on out patient   Suicidal ideations (04/30/2017)    Assessment:  denies    Plan:  Continue to provide  counseling        The following regarding medications was addressed during rounds;     the risks and benefits of the proposed medication  patient given opportunity to ask questions    Medication changes: please see above for details  Will continue to adjust medications as deemed appropriate and to response for symptom management (see medication orders made in EMR). Will continue to provide individual psychotherapy and millieu, recreational, occupational, group, substance abuse therapies to address target symptoms and as deemed appropriate for the individual patient. Following issues were addressed, focused on;      [x]    Pre-admission and current problems    []    Housing issues    []   Occupational issues    []   Academic issues    []   Legal issues    []   Medical issues    [x]   Interpersonal issues    []   Stress of hospitalization     Psychoeducation provided. Treatment plan reviewed with patient-including diagnosis and medications. Worked on issues of denial & effects of substance dependency/use.     Length of psychotherapy session: 25 minutes    Expected Discharge Date (Day)/Estimated length of stay:     Patient was seen during coordinated treatment rounds and discussed with  attending psychiatrist who concurs with above.     Gerald StabsBhupinder Nox Talent, MD  05/04/2017  11:44 AM

## 2017-05-04 NOTE — Progress Notes (Signed)
Problem: Suicide/Homicide (Adult/Pediatric)  Goal: *STG:  Verbalizes alternative ways of dealing with maladaptive feelings/behaviors  Outcome: Progressing Towards Goal  Discusses alternative methods of coping w/ stress as well as developing a sober plan.  Goal: *LTG:  Identifies available community resources  Outcome: Progressing Towards Goal  Discussed AA meetings upon discharge.    Comments: Visible on the unit.  Medication compliant.  No SI, HI & AVH expressed at the time of assessment.  Discussed attending AA meetings as part of a sober plan; with possible discharge on Wednesday.  Will continue to monitor the patient Q15 minutes for safety as ordered.

## 2017-05-04 NOTE — Behavioral Health Treatment Team (Signed)
Met with patient and team to discuss status. patient present with less depressed mood and brighter affect. She states that she I feeling better mentally and has been abel to rest over the weekend. Patient did have some complaints about constipation and acid reflex but otherwise did appear improved. patient states that [facility] norma [facility] to [facility] her over the weekend and she plans to go back there to live upon discharge.  Discussed with patient returning to [facility] so that she could follow up wit [facility] services - he was reluctant and would prefer to just continue wt [facility] clinic. Patient denies suicidal ideation. She states that she plans to try harder to remain sober and does not want to relapse. She has gone to [employer] in the last with her brother, - discussed this as a possible option. patient was also referred to [facility] Care Wellness [facility]. Continue with supportive approach. Dravyn Severs LCSW-R

## 2017-05-04 NOTE — Behavioral Health Treatment Team (Cosign Needed)
Group was called but the patient did not attend.

## 2017-05-05 MED ORDER — DOCUSATE SODIUM 100 MG CAP
100 mg | ORAL_CAPSULE | Freq: Two times a day (BID) | ORAL | 0 refills | Status: AC
Start: 2017-05-05 — End: 2017-06-04

## 2017-05-05 MED ORDER — PANTOPRAZOLE 40 MG TAB, DELAYED RELEASE
40 mg | ORAL_TABLET | Freq: Every day | ORAL | 0 refills | Status: AC
Start: 2017-05-05 — End: 2017-06-05

## 2017-05-05 MED ORDER — NAPROXEN 500 MG TAB
500 mg | ORAL_TABLET | Freq: Two times a day (BID) | ORAL | 0 refills | Status: AC
Start: 2017-05-05 — End: 2017-06-04

## 2017-05-05 MED ORDER — BUPROPION SR 100 MG TAB
100 mg | ORAL_TABLET | Freq: Two times a day (BID) | ORAL | 0 refills | Status: AC
Start: 2017-05-05 — End: 2017-06-04

## 2017-05-05 MED FILL — NAPROXEN 250 MG TAB: 250 mg | ORAL | Qty: 2

## 2017-05-05 MED FILL — PANTOPRAZOLE 40 MG TAB, DELAYED RELEASE: 40 mg | ORAL | Qty: 1

## 2017-05-05 MED FILL — QUETIAPINE 25 MG TAB: 25 mg | ORAL | Qty: 1

## 2017-05-05 MED FILL — BUPROPION SR 100 MG TAB: 100 mg | ORAL | Qty: 1

## 2017-05-05 MED FILL — DOCUSATE SODIUM 100 MG CAP: 100 mg | ORAL | Qty: 1

## 2017-05-05 MED FILL — PAIN AND FEVER 325 MG TABLET: 325 mg | ORAL | Qty: 2

## 2017-05-05 MED FILL — MAG-AL PLUS 200 MG-200 MG-20 MG/5 ML ORAL SUSPENSION: 200-200-20 mg/5 mL | ORAL | Qty: 30

## 2017-05-05 NOTE — Other (Signed)
SHIFT CHANGE REPORT    Verbal report given to: M Peters RN, D Weissinger RN, L Zemski RN  using SBAR, Doc Flowsheets, Kardex, MAR and Notes.  Rounds checked Q2H by RN    By: Carol A Bliefernich, RN

## 2017-05-05 NOTE — Behavioral Health Treatment Team (Signed)
Kristen RalphsVivian is visible on the unit. Denies SI. Out of room for snack. She is looking forward to being discharged tomorrow. Safety maintained on the unit with continuous 15 minuet checks. Will continue to monitor and provide therapeutic support.

## 2017-05-05 NOTE — Other (Signed)
Shift change report given to T. Triassi RN and E. Higby RN oncoming nurse by M Kieren Ricci RN.offgoing nurse. Report included the following information Kardex, MAR and Recent Results and rounds q2h.

## 2017-05-05 NOTE — Progress Notes (Signed)
Psychiatric Progress Note  Date: 05/05/2017  Account Number: [id number]  Name: Ceria Suminski & M PROGRESS NOTE:   Coordinated treatment team rounds conducted with psychiatrist, patient, nurses and/or social worker present ; discussions held with case manager and/or family members; Chart reviewed in full including consultant notes, ancillary staff notes, vitals and labs in [facility] EMR reviewed in full.      SUBJECTIVE: sh is much less depressed and denies suicidal thoughts sleeps and eats wekll she is social and interacts with peers and staff    Current Symptoms:     Aggressive []   Threatening []   Loud []   Assaultive []   Inapropriate []   Suspicious []   Paranoid []   Hyperactive []   Hyper verbal []   Flight of ideas  []   Grandiose []   Irritable []  Sad []   Depressed  []   Helplessness  [x]   Hopelessness []  Insomnia []  Impulsive []    Flat []   Constricted [x]  Tearful []   Agitated []  Isolated []   Withdrawn []   Disheveled []    Suicidal []  Homicidal []  Anxiety [x]  Manic []  Thought Disorder []  Well Dressed []   Mood swings  []  Disorganized thoughts  []  Illogical thoughts  []   Anhedonia  []  Poor Apatite  []  Tiredness  []  Looseness of Association  []  Demanding  []      OBJECTIVE:   Mental Status Examination:  Cognition and Memory Intact [x]  Alert Ambulatory and Oriented x3 [x]    Judgment- Fair [x]  Poor []     Insight- Fair [x]   Poor []     Delusions- Present []   Absent [x]   Halucinations- Present []   Absent [x]    No Side effects [x]  No EPS /AIMS Noted  [x]      Pertinant Data:  Patient Vitals for the past 8 hrs:   Temp Pulse BP SpO2   05/05/17 0602 97.3 ??F (36.3 ??C) 85 129/85 99 %       No results found for this or any previous visit (from the past 24 hour(s)).    Medications:  Current Facility-Administered Medications   Medication Dose Route Frequency   ??? buPROPion SR (WELLBUTRIN SR) tablet 100 mg  100 mg Oral BID   ??? pantoprazole (PROTONIX) tablet 40 mg  40 mg Oral ACB    ??? alum-mag hydroxide-simeth (MYLANTA) oral suspension 30 mL  30 mL Oral Q4H PRN   ??? QUEtiapine (SEROquel) tablet 25 mg  25 mg Oral QHS PRN   ??? docusate sodium (COLACE) capsule 100 mg  100 mg Oral BID   ??? naproxen (NAPROSYN) tablet 500 mg  500 mg Oral BID WITH MEALS   ??? guaiFENesin (ROBITUSSIN) 100 mg/5 mL oral liquid 100 mg  100 mg Oral Q4H PRN   ??? haloperidol (HALDOL) tablet 5 mg  5 mg Oral Q4H PRN   ??? diphenhydrAMINE (BENADRYL) capsule 50 mg  50 mg Oral Q4H PRN   ??? acetaminophen (TYLENOL) tablet 650 mg  650 mg Oral Q4H PRN   ??? influenza vaccine 2018-19 (36 mos+)(PF) (FLUZONE QUAD) injection 0.5 mL  0.5 mL IntraMUSCular PRIOR TO DISCHARGE      Scheduled Medications:   Current Facility-Administered Medications   Medication Dose Route Frequency   ??? buPROPion SR (WELLBUTRIN SR) tablet 100 mg  100 mg Oral BID   ??? pantoprazole (PROTONIX) tablet 40 mg  40 mg Oral ACB   ??? docusate sodium (COLACE) capsule 100 mg  100 mg Oral BID   ??? naproxen (NAPROSYN) tablet 500 mg  500 mg Oral  BID WITH MEALS   ??? influenza vaccine 2018-19 (36 mos+)(PF) (FLUZONE QUAD) injection 0.5 mL  0.5 mL IntraMUSCular PRIOR TO DISCHARGE       ASSESSMENT/PLAN:                Improving Resolved Same Worse N/A     Depression   [x]      []      []      []      []        Mania/Hypomania   []      []      []      []      []        Anxiety   [x]      []      []      []      []        Psychosis   []      []      []      []      []        Agitation/Aggression   []      []      []      []      []        Insomnia   []      []      []      []      []       Confusion/Disorganization   []     []      []      []     []     Hallucination  []    []    []    []    []     Delusions  []    []    []    []    []     Paranoia  []    []    []    []    []     Hopelessness  []    []    []    []    []     Appetite  []    []    []    []    []     Judgment  [x]    []    []    []    []     Insight  [x]    []    []    []    []     Suicidal thoughts  []    [x]    []    []    []     Homicidal Thoughts  []    []    []    []    []            Patient's overall response to treatment:     []    Positive : mild/moderate / significant    []    Partial Change    []    No Change    [x]   Close to baseline     Reason for continued hospitalization:      [x]    Further stabilization needed    []    Unsafe to self/others    []    Not at baseline    []   Needs intensive inpatient treatment still    []   At high risk of relapse if discharged at this time     Diagnoses:    Patient Active Hospital Problem List:   Depression (04/30/2017)    Assessment:  Much less depressed     Plan:  Continue current medications and counseling   Alcohol abuse (04/30/2017)    Assessment:  No withdrawal     Plan:  Continue to monitor   Suicidal ideations (04/30/2017)    Assessment:  denies  Plan:  Counseling provided        The following regarding medications was addressed during rounds;     the risks and benefits of the proposed medication  patient given opportunity to ask questions    Medication changes: please see above for details  Will continue to adjust medications as deemed appropriate and to response for symptom management (see medication orders made in EMR). Will continue to provide individual psychotherapy and millieu, recreational, occupational, group, substance abuse therapies to address target symptoms and as deemed appropriate for the individual patient. Following issues were addressed, focused on;      [x]    Pre-admission and current problems    []    Housing issues    []   Occupational issues    []   Academic issues    []   Legal issues    []   Medical issues    [x]   Interpersonal issues    []   Stress of hospitalization     Psychoeducation provided. Treatment plan reviewed with patient-including diagnosis and medications. Worked on issues of denial & effects of substance dependency/use.     Length of psychotherapy session: 25 minutes    Expected Discharge Date (Day)/Estimated length of stay: one day    Patient was seen during coordinated treatment rounds and discussed with  attending psychiatrist who concurs with above.     Gerald StabsBhupinder Kmari Halter, MD  05/05/2017  12:23 PM

## 2017-05-05 NOTE — Progress Notes (Signed)
Problem: Patient Education: Go to Patient Education Activity  Goal: Patient/Family Education  Outcome: Progressing Towards Goal  Pt is alert and oriented with stable mood.  Denies SI.  Out of room, visible on unit.  Attends activities as scheduled.  Med compliant.  Pt offers no complaints.  Continue tx plan and 15 minute checks for safety and evaluation.

## 2017-05-06 MED FILL — QUETIAPINE 25 MG TAB: 25 mg | ORAL | Qty: 1

## 2017-05-06 MED FILL — BUPROPION SR 100 MG TAB: 100 mg | ORAL | Qty: 1

## 2017-05-06 MED FILL — FLUZONE QUAD 2018-19(PF) 60 MCG(15 MCGX4)/0.5 ML INTRAMUSCULAR SYRINGE: 60 mcg (15 mcg x 4)/0.5 mL | INTRAMUSCULAR | Qty: 0.5

## 2017-05-06 MED FILL — DIPHENHYDRAMINE 50 MG CAP: 50 mg | ORAL | Qty: 1

## 2017-05-06 MED FILL — NAPROXEN 250 MG TAB: 250 mg | ORAL | Qty: 2

## 2017-05-06 MED FILL — DOCUSATE SODIUM 100 MG CAP: 100 mg | ORAL | Qty: 1

## 2017-05-06 MED FILL — PANTOPRAZOLE 40 MG TAB, DELAYED RELEASE: 40 mg | ORAL | Qty: 1

## 2017-05-06 NOTE — Behavioral Health Treatment Team (Signed)
Discharge note : Patient was discharged this morning [facility] at [address], [place]. patient ws transported by [facility]ebbie [facility]. [facility] at 10 am  With Lonie Peakafael Martinez and at [facility] on 05/08/17 at  9.30 am  With Gillian ScarceEmily Silver. Patient will continue with receive support from Shanon RosserSister Norma - ministry social worker. Felise Georgia LCSW-R

## 2017-05-06 NOTE — Other (Signed)
SHIFT CHANGE REPORT     Verbal report given to: M. Peters RN, V. Hansen RN, N. Ostergren RN  using SBAR, Doc Flowsheets, Kardex, MAR and Notes.  Rounds checked Q2H by RN     By: Tara Triassi RN

## 2017-05-06 NOTE — Discharge Summary (Signed)
[facility]    DISCHARGE SUMMARY    Adron Beneame:Ward, Kristen  MR#: [id number]  DOB: 1955/10/05  ACCOUNT #: [id number]   ADMIT DATE: 04/30/2017  DISCHARGE DATE: 05/06/2017    HOSPITAL COURSE:  The patient was a 61 year old female who was admitted because she was depressed and making statements to hurt herself.  For present history, past history, family history, review of systems, and diagnosis, please refer to history and physical and psych assessment done at the time of admission.  Her labs were as follows:  CBC with diff normal.  Urinalysis had +1 bacteria.  Electrolytes and hepatic profile were normal, and urine toxicology was negative.  Alcohol level was 96 in the emergency room, and chest x-rays, no evidence of any active chest disease.  Patient was seen on a daily basis and received supportive counseling, groups and activities.  Patient was started with Wellbutrin 75 mg a day, which was titrated up to 100 mg twice a day.  Colace was given for constipation.  Naproxen was given 500 mg twice a day for back pain.  Protonix was given 40 mg a day for gastroesophageal reflux disease, and patient started doing much better, responded to the medications as well as regimen on the unit, and patient denies any suicidal or homicidal thoughts, no longer found to be depressed.  Denies any hallucinations or delusions, able to comprehend all of the information, alert, oriented.  Judgment and insight have improved, therefore being discharged today to go home with recommendations for follow up in the community.    FINAL DIAGNOSES:  Major depression, recurrent, without psychotic features, cocaine abuse, alcohol abuse.    AFTERCARE:  The patient will continue her medications such as Protonix 40 mg a day for gastroesophageal reflux disease, naproxen 500 mg twice a day for back pain, and Colace 100 mg twice a day for constipation, Wellbutrin-SR 100 mg twice a day for depression.    ACTIVITY:  As tolerated.     DIET:  Regular.    DISPOSITION:  Discharged to home.      Barrett ShellBHUNPINDER Kylina Vultaggio, MD       BG/DN  D: 05/06/2017 12:24     T: 05/07/2017 09:15  JOB #: [id number]

## 2017-05-06 NOTE — Behavioral Health Treatment Team (Signed)
Discharge instructions reviewed with patient. Patient is in agreement with discharge. Pt verbalized understanding of all follow-up appointments and instructions. Pt denies SI/HI. Tobacco referral not applicable. Primary care appt declined. Flu vaccine given. Personal property returned to patient, including PTA medications.  [place] Hansen, RN

## 2017-05-06 NOTE — Progress Notes (Signed)
please refer to discharge summary done and dictated

## 2017-05-06 NOTE — Discharge Summary (Signed)
[facility]    DISCHARGE SUMMARY    Adron Beneame:Ward, Kristen  MR#: [id number]  DOB: [birth year]  ACCOUNT #: [id number]   ADMIT DATE: 04/30/2017  DISCHARGE DATE: 05/06/2017    HOSPITAL COURSE:  The patient was a 61 year old female who was admitted because she was depressed and making statements to hurt herself.  For present history, past history, family history, review of systems, and diagnosis, please refer to history and physical and psych assessment done at the time of admission.  Her labs were as follows:  CBC with diff normal.  Urinalysis had +1 bacteria.  Electrolytes and hepatic profile were normal, and urine toxicology was negative.  Alcohol level was 96 in the emergency room, and chest x-rays, no evidence of any active chest disease.  Patient was seen on a daily basis and received supportive counseling, groups and activities.  Patient was started with Wellbutrin 75 mg a day, which was titrated up to 100 mg twice a day.  Colace was given for constipation.  Naproxen was given 500 mg twice a day for back pain.  Protonix was given 40 mg a day for gastroesophageal reflux disease, and patient started doing much better, responded to the medications as well as regimen on the unit, and patient denies any suicidal or homicidal thoughts, no longer found to be depressed.  Denies any hallucinations or delusions, able to comprehend all of the information, alert, oriented.  Judgment and insight have improved, therefore being discharged today to go home with recommendations for follow up in the community.    FINAL DIAGNOSES:  Major depression, recurrent, without psychotic features, cocaine abuse, alcohol abuse.    AFTERCARE:  The patient will continue her medications such as Protonix 40 mg a day for gastroesophageal reflux disease, naproxen 500 mg twice a day for back pain, and Colace 100 mg twice a day for constipation, Wellbutrin-SR 100 mg twice a day for depression.    ACTIVITY:  As tolerated.    DIET:   Regular.    DISPOSITION:  Discharged to home.      Barrett ShellBHUNPINDER Jaymes Revels, MD       BG/DN  D: 05/06/2017 12:24     T: 05/07/2017 09:15  JOB #: [id number]

## 2018-01-12 ENCOUNTER — Encounter (HOSPITAL_COMMUNITY): Payer: Self-pay | Admitting: Emergency Medicine

## 2018-01-12 ENCOUNTER — Other Ambulatory Visit: Payer: Self-pay

## 2018-01-12 ENCOUNTER — Emergency Department (HOSPITAL_COMMUNITY)
Admission: EM | Admit: 2018-01-12 | Discharge: 2018-01-12 | Disposition: A | Discharge status: Home / Self Care | Payer: Medicaid Other | Attending: Emergency Medicine | Admitting: Emergency Medicine

## 2018-01-12 DIAGNOSIS — R109 Unspecified abdominal pain: Secondary | ICD-10-CM | POA: Insufficient documentation

## 2018-01-12 DIAGNOSIS — J019 Acute sinusitis, unspecified: Secondary | ICD-10-CM

## 2018-01-12 DIAGNOSIS — K029 Dental caries, unspecified: Secondary | ICD-10-CM | POA: Insufficient documentation

## 2018-01-12 DIAGNOSIS — R0981 Nasal congestion: Secondary | ICD-10-CM | POA: Diagnosis present

## 2018-01-12 LAB — CBC
HEMATOCRIT: 38.7 % (ref 36.0–46.0)
HEMOGLOBIN: 12.3 g/dL (ref 12.0–15.0)
MCH: 26.8 pg (ref 26.0–34.0)
MCHC: 31.8 g/dL (ref 30.0–36.0)
MCV: 84.3 fL (ref 78.0–100.0)
Platelets: 223 10*3/uL (ref 150–400)
RBC: 4.59 MIL/uL (ref 3.87–5.11)
RDW: 15.7 % — AB (ref 11.5–15.5)
WBC: 6.5 10*3/uL (ref 4.0–10.5)

## 2018-01-12 LAB — COMPREHENSIVE METABOLIC PANEL
ALBUMIN: 3.4 g/dL — AB (ref 3.5–5.0)
ALT: 16 U/L (ref 0–44)
AST: 16 U/L (ref 15–41)
Alkaline Phosphatase: 131 U/L — ABNORMAL HIGH (ref 38–126)
Anion gap: 8 (ref 5–15)
BILIRUBIN TOTAL: 0.6 mg/dL (ref 0.3–1.2)
BUN: 20 mg/dL (ref 8–23)
CHLORIDE: 108 mmol/L (ref 98–111)
CO2: 28 mmol/L (ref 22–32)
Calcium: 9 mg/dL (ref 8.9–10.3)
Creatinine, Ser: 1.06 mg/dL — ABNORMAL HIGH (ref 0.44–1.00)
GFR calc Af Amer: >60 mL/min (ref >60)
GFR calc non Af Amer: 54 mL/min — ABNORMAL LOW (ref >60)
GLUCOSE: 97 mg/dL (ref 70–99)
POTASSIUM: 3.7 mmol/L (ref 3.5–5.1)
SODIUM: 144 mmol/L (ref 135–145)
Total Protein: 6.7 g/dL (ref 6.5–8.1)

## 2018-01-12 LAB — URINALYSIS, ROUTINE W REFLEX MICROSCOPIC
BACTERIA UA: NONE SEEN
Bilirubin Urine: NEGATIVE
Glucose, UA: NEGATIVE mg/dL
Hgb urine dipstick: NEGATIVE
Ketones, ur: NEGATIVE mg/dL
Nitrite: NEGATIVE
PH: 7 (ref 5.0–8.0)
Protein, ur: NEGATIVE mg/dL
SPECIFIC GRAVITY, URINE: 1.016 (ref 1.005–1.030)

## 2018-01-12 LAB — LIPASE, BLOOD: LIPASE: 31 U/L (ref 11–51)

## 2018-01-12 MED ORDER — AMOXICILLIN-POT CLAVULANATE 875-125 MG PO TABS: 1.0000 | ORAL_TABLET | Freq: Two times a day (BID) | ORAL | 0 refills | Status: DC

## 2018-01-12 NOTE — ED Provider Notes (Signed)
[facility] DEPT Provider Note   CSN: [id number] Arrival date & time: 01/12/18  1919     History   Chief Complaint Chief Complaint  Patient presents with  . Abdominal Pain  . Dysuria  . Dental Pain    HPI Tracie Atkins is a 62 y.o. female.  HPI   Patient presents for evaluation of sinus congestion with drainage for 3 or 4 months, tooth pain, and a bad odor in her mouth.  She denies fever, chills, cough, shortness of breath, focal weakness or paresthesia.  She states that she is "new in town," arriving about a month ago and living with her son.  There are no other known modifying factors.  Past Medical History:  Diagnosis Date  . Depression     Patient Active Problem List   Diagnosis Date Noted  . Alcohol abuse with alcohol-induced mood disorder ([place]) 11/20/2016    Past Surgical History:  Procedure Laterality Date  . TUBAL LIGATION       OB History    Gravida  3   Para  3   Term  3   Preterm      AB      Living  3     SAB      TAB      Ectopic      Multiple      Live Births               Home Medications    Prior to Admission medications   Medication Sig Start Date End Date Taking? Authorizing Provider  cephALEXin (KEFLEX) 500 MG capsule Take 1 capsule (500 mg total) by mouth 4 (four) times daily. Patient not taking: Reported on 11/20/2016 10/20/16   Mesner, Corene Cornea, MD    Family History No family history on file.  Social History Social History   Tobacco Use  . Smoking status: Never Smoker  . Smokeless tobacco: Never Used  Substance Use Topics  . Alcohol use: Yes    Alcohol/week: 1.2 oz    Types: 2 Cans of beer per week  . Drug use: No     Allergies   Patient has no known allergies.   Review of Systems Review of Systems  All other systems reviewed and are negative.    Physical Exam Updated Vital Signs BP (!) 145/88 (BP Location: Left Arm)   Pulse 88   Temp 98.2 F (36.8 C)   Resp 16    Ht 5' 4"  (1.626 m)   Wt 81.6 kg (180 lb)   SpO2 98%   BMI 30.90 kg/m   Physical Exam  Constitutional: She is oriented to person, place, and time. She appears well-developed and well-nourished.  HENT:  Head: Normocephalic and atraumatic.  Mouth has poor dentition with multiple teeth eroded to the base.  There is no trismus.  There is no visible or palpable oral abscess.  No oral bleeding or drainage.  Eyes: Pupils are equal, round, and reactive to light. Conjunctivae and EOM are normal.  Neck: Normal range of motion and phonation normal. Neck supple.  Cardiovascular: Normal rate and regular rhythm.  Pulmonary/Chest: Effort normal and breath sounds normal. She exhibits no tenderness.  Musculoskeletal: Normal range of motion. She exhibits no edema or deformity.  Neurological: She is alert and oriented to person, place, and time. She exhibits normal muscle tone.  Skin: Skin is warm and dry.  Psychiatric: She has a normal mood and affect. Her behavior is normal. Judgment  and thought content normal.  Nursing note and vitals reviewed.    ED Treatments / Results  Labs (all labs ordered are listed, but only abnormal results are displayed) Labs Reviewed  COMPREHENSIVE METABOLIC PANEL - Abnormal; Notable for the following components:      Result Value   Creatinine, Ser 1.06 (*)    Albumin 3.4 (*)    Alkaline Phosphatase 131 (*)    GFR calc non Af Amer 54 (*)    All other components within normal limits  CBC - Abnormal; Notable for the following components:   RDW 15.7 (*)    All other components within normal limits  URINALYSIS, ROUTINE W REFLEX MICROSCOPIC - Abnormal; Notable for the following components:   APPearance HAZY (*)    Leukocytes, UA SMALL (*)    All other components within normal limits  LIPASE, BLOOD    EKG None  Radiology No results found.  Procedures Procedures (including critical care time)  Medications Ordered in ED Medications - No data to  display   Initial Impression / Assessment and Plan / ED Course  I have reviewed the triage vital signs and the nursing notes.  Pertinent labs & imaging results that were available during my care of the patient were reviewed by me and considered in my medical decision making (see chart for details).  Clinical Course as of Jan 12 2213  Tue Jan 12, 2018  2213 Normal except creatinine elevated, albumin low, alk phosphatase elevated, GFR low  Comprehensive metabolic panel(!) [EW]  7414 Normal  CBC(!) [EW]  2214 Normal except small leukocyte, 6-10 white cells  Urinalysis, Routine w reflex microscopic(!) [EW]    Clinical Course User Index [EW] Daleen Bo, MD     Patient Vitals for the past 24 hrs:  BP Temp Pulse Resp SpO2 Height Weight  01/12/18 2145 (!) 145/88 - 88 16 98 % - -  01/12/18 1951 - - - - - 5' 4"  (1.626 m) 81.6 kg (180 lb)  01/12/18 1950 (!) 144/94 98.2 F (36.8 C) 71 20 97 % - -    10:23 PM Reevaluation with update and discussion. After initial assessment and treatment, an updated evaluation reveals no change in clinical status.  Findings discussed with the patient and all questions were answered. Daleen Bo   Medical Decision Making: Evaluation consistent with sinusitis, nonspecific.  Patient is not a smoker.  She has poor dentition but there is no sign of oral infection.  Doubt serious bacterial infection, metabolic instability or impending vascular collapse.  CRITICAL CARE-no Performed by: Daleen Bo   Nursing Notes Reviewed/ Care Coordinated Applicable Imaging Reviewed Interpretation of Laboratory Data incorporated into ED treatment  The patient appears reasonably screened and/or stabilized for discharge and I doubt any other medical condition or other [facility] requiring further screening, evaluation, or treatment in the ED at this time prior to discharge.  Plan: Home Medications-OTC analgesia; Home Treatments-rest, fluids; return here if the recommended  treatment, does not improve the symptoms; Recommended follow up-PCP and dental follow-up as soon as possible     Final Clinical Impressions(s) / ED Diagnoses   Final diagnoses:  Subacute sinusitis, unspecified location  Dental caries    ED Discharge Orders    None       Daleen Bo, MD 01/12/18 2228

## 2018-01-12 NOTE — Discharge Instructions (Signed)
Get plenty of rest, and drink a lot of fluids.  Follow-up with a primary care doctor, and dentist as soon as possible for further care and treatment.  Use the resource guides, to help you find providers to work on your problems.

## 2018-01-12 NOTE — ED Triage Notes (Signed)
Pt presents with multiple complaints. Patient states she has "infection in my gums. I keep swallowing it and I think the infection is in my body. I'm having belly pain and back pain." Patient also complaining of urinary symptoms and foul smelling urine.

## 2018-01-17 ENCOUNTER — Encounter (HOSPITAL_COMMUNITY): Payer: Self-pay | Admitting: Emergency Medicine

## 2018-01-17 ENCOUNTER — Emergency Department (HOSPITAL_COMMUNITY)
Admission: EM | Admit: 2018-01-17 | Discharge: 2018-01-18 | Disposition: A | Discharge status: Home / Self Care | Payer: PRIVATE HEALTH INSURANCE | Attending: Emergency Medicine | Admitting: Emergency Medicine

## 2018-01-17 DIAGNOSIS — F333 Major depressive disorder, recurrent, severe with psychotic symptoms: Secondary | ICD-10-CM | POA: Insufficient documentation

## 2018-01-17 DIAGNOSIS — F1014 Alcohol abuse with alcohol-induced mood disorder: Secondary | ICD-10-CM | POA: Diagnosis present

## 2018-01-17 DIAGNOSIS — R45851 Suicidal ideations: Secondary | ICD-10-CM | POA: Insufficient documentation

## 2018-01-17 DIAGNOSIS — Z79899 Other long term (current) drug therapy: Secondary | ICD-10-CM | POA: Insufficient documentation

## 2018-01-17 DIAGNOSIS — F329 Major depressive disorder, single episode, unspecified: Secondary | ICD-10-CM | POA: Diagnosis present

## 2018-01-17 HISTORY — DX: Alcohol abuse, uncomplicated: F10.10

## 2018-01-17 LAB — COMPREHENSIVE METABOLIC PANEL
ALT: 19 U/L (ref 0–44)
AST: 21 U/L (ref 15–41)
Albumin: 3.9 g/dL (ref 3.5–5.0)
Alkaline Phosphatase: 156 U/L — ABNORMAL HIGH (ref 38–126)
Anion gap: 9 (ref 5–15)
BUN: 11 mg/dL (ref 8–23)
CHLORIDE: 111 mmol/L (ref 98–111)
CO2: 27 mmol/L (ref 22–32)
Calcium: 9 mg/dL (ref 8.9–10.3)
Creatinine, Ser: 0.56 mg/dL (ref 0.44–1.00)
Glucose, Bld: 108 mg/dL — ABNORMAL HIGH (ref 70–99)
POTASSIUM: 3.6 mmol/L (ref 3.5–5.1)
Sodium: 147 mmol/L — ABNORMAL HIGH (ref 135–145)
Total Bilirubin: 0.4 mg/dL (ref 0.3–1.2)
Total Protein: 7.3 g/dL (ref 6.5–8.1)

## 2018-01-17 LAB — RAPID URINE DRUG SCREEN, HOSP PERFORMED
AMPHETAMINES: NOT DETECTED
Benzodiazepines: NOT DETECTED
Cocaine: NOT DETECTED
OPIATES: NOT DETECTED
TETRAHYDROCANNABINOL: NOT DETECTED

## 2018-01-17 LAB — CBC
HCT: 41.1 % (ref 36.0–46.0)
HEMOGLOBIN: 13.2 g/dL (ref 12.0–15.0)
MCH: 27.2 pg (ref 26.0–34.0)
MCHC: 32.1 g/dL (ref 30.0–36.0)
MCV: 84.7 fL (ref 78.0–100.0)
PLATELETS: 277 10*3/uL (ref 150–400)
RBC: 4.85 MIL/uL (ref 3.87–5.11)
RDW: 15.9 % — ABNORMAL HIGH (ref 11.5–15.5)
WBC: 7 10*3/uL (ref 4.0–10.5)

## 2018-01-17 LAB — ETHANOL: ALCOHOL ETHYL (B): 211 mg/dL — AB (ref <10)

## 2018-01-17 LAB — ACETAMINOPHEN LEVEL: Acetaminophen (Tylenol), Serum: <10 ug/mL — ABNORMAL LOW (ref 10–30)

## 2018-01-17 LAB — SALICYLATE LEVEL

## 2018-01-17 MED ORDER — AMOXICILLIN-POT CLAVULANATE 875-125 MG PO TABS
1.0000 | ORAL_TABLET | Freq: Two times a day (BID) | ORAL | Status: DC
  Administered 2018-01-17 – 2018-01-18 (×2): 1 via ORAL
  Filled 2018-01-17 (×2): qty 1

## 2018-01-17 MED ORDER — ZIPRASIDONE MESYLATE 20 MG IM SOLR: 20.0000 mg | Freq: Once | INTRAMUSCULAR | Status: DC

## 2018-01-17 MED ORDER — ALUM & MAG HYDROXIDE-SIMETH 200-200-20 MG/5ML PO SUSP: 30.0000 mL | Freq: Four times a day (QID) | ORAL | Status: DC | PRN

## 2018-01-17 MED ORDER — DIPHENHYDRAMINE HCL 50 MG/ML IJ SOLN: 50.0000 mg | Freq: Once | INTRAMUSCULAR | Status: DC

## 2018-01-17 MED ORDER — LORAZEPAM 1 MG PO TABS: 0.0000 mg | ORAL_TABLET | Freq: Two times a day (BID) | ORAL | Status: DC

## 2018-01-17 MED ORDER — LORAZEPAM 1 MG PO TABS: 2.0000 mg | ORAL_TABLET | Freq: Once | ORAL | Status: DC

## 2018-01-17 MED ORDER — LORAZEPAM 2 MG/ML IJ SOLN: 0.0000 mg | Freq: Two times a day (BID) | INTRAMUSCULAR | Status: DC

## 2018-01-17 MED ORDER — THIAMINE HCL 100 MG/ML IJ SOLN: 100.0000 mg | Freq: Every day | INTRAMUSCULAR | Status: DC

## 2018-01-17 MED ORDER — ZOLPIDEM TARTRATE 5 MG PO TABS
5.0000 mg | ORAL_TABLET | Freq: Every evening | ORAL | Status: DC | PRN
  Administered 2018-01-17: 5 mg via ORAL
  Filled 2018-01-17: qty 1

## 2018-01-17 MED ORDER — LORAZEPAM 1 MG PO TABS
0.0000 mg | ORAL_TABLET | Freq: Four times a day (QID) | ORAL | Status: DC
  Administered 2018-01-18: 1 mg via ORAL
  Filled 2018-01-17: qty 1

## 2018-01-17 MED ORDER — ONDANSETRON HCL 4 MG PO TABS: 4.0000 mg | ORAL_TABLET | Freq: Three times a day (TID) | ORAL | Status: DC | PRN

## 2018-01-17 MED ORDER — LORAZEPAM 2 MG/ML IJ SOLN: 0.0000 mg | Freq: Four times a day (QID) | INTRAMUSCULAR | Status: DC

## 2018-01-17 MED ORDER — IBUPROFEN 200 MG PO TABS: 600.0000 mg | ORAL_TABLET | Freq: Three times a day (TID) | ORAL | Status: DC | PRN

## 2018-01-17 MED ORDER — LORAZEPAM 2 MG/ML IJ SOLN: 2.0000 mg | Freq: Once | INTRAMUSCULAR | Status: DC

## 2018-01-17 MED ORDER — VITAMIN B-1 100 MG PO TABS
100.0000 mg | ORAL_TABLET | Freq: Every day | ORAL | Status: DC
  Administered 2018-01-17: 100 mg via ORAL
  Filled 2018-01-17: qty 1

## 2018-01-17 NOTE — Progress Notes (Signed)
TTS spoke with [facility] who states [facility] is at capacity. TTS to seek placement.  Lind Covert, MSW, LCSW Therapeutic Triage Specialist  [phone]

## 2018-01-17 NOTE — ED Triage Notes (Signed)
Patient brought in by [employer] voluntary with complaints of suicidal ideation. Hx of same and alcohol abuse. States "my kids brought me all the way from [place] and they are not taking care of me".

## 2018-01-17 NOTE — ED Notes (Addendum)
Pt seen by sitter to be tearful after TTS consult and mumbling to herself. Pt was seen by sitter to hold the blanket up to her neck and stated to sitter that she "should just kill myself" and wouldn't give blanket to sitter and then gave the blanket to sitter.  And stating that she doesn't want to live. Pt 1:1 with nurse and pt agreed to stay and give [place] a chance to help her. Pt has agreed to stay and PRN orders received. But fell back to sleep before medication could be administered. Pt transferred to acute area.

## 2018-01-17 NOTE — ED Provider Notes (Signed)
[facility] DEPT Provider Note   CSN: [id number] Arrival date & time: 01/17/18  1806     History   Chief Complaint Chief Complaint  Patient presents with  . Suicidal    HPI Tracie Atkins is a 62 y.o. female with a hx of depression, prior suicide attempts, and ETOH abuse who presents to the ED with [employer] voluntarily for depression/anxiety requiring medical clearance today. Patient states that she has had a lot going on at home, states that her kids brought her down from [place] and "are not taking care of me." She states she has had a lot of life stressors and has felt depressed and anxious, reporting passing thoughts of SI, none at present when I ask but she has informed nursing staff she wants to actively commit suicide and walk into traffic. No attempts recently. No other specific alleviating/aggravating factors. Patient does admit to EtOH consumption today- does not elaborate on the amount. Denies chest pain, dyspnea, fever, chills, or abdominal pain. Patient reports hx of ETOH withdrawal several years ago, none recently, she reports daily alcohol consumption, when asked about amount she states "not a lot."   HPI  Past Medical History:  Diagnosis Date  . Depression   . ETOH abuse     Patient Active Problem List   Diagnosis Date Noted  . Alcohol abuse with alcohol-induced mood disorder ([place]) 11/20/2016    Past Surgical History:  Procedure Laterality Date  . TUBAL LIGATION       OB History    Gravida  3   Para  3   Term  3   Preterm      AB      Living  3     SAB      TAB      Ectopic      Multiple      Live Births               Home Medications    Prior to Admission medications   Medication Sig Start Date End Date Taking? Authorizing Provider  amoxicillin-clavulanate (AUGMENTIN) 875-125 MG tablet Take 1 tablet by mouth 2 (two) times daily. One po bid x 7 days 01/12/18   Daleen Bo, MD    diphenhydramine-acetaminophen (TYLENOL PM) 25-500 MG TABS tablet Take 1 tablet by mouth at bedtime as needed (sleep).    [provider]    Family History No family history on file.  Social History Social History   Tobacco Use  . Smoking status: Never Smoker  . Smokeless tobacco: Never Used  Substance Use Topics  . Alcohol use: Yes    Alcohol/week: 1.2 oz    Types: 2 Cans of beer per week  . Drug use: No     Allergies   Patient has no known allergies.   Review of Systems Review of Systems  Constitutional: Negative for chills and fever.  Respiratory: Negative for shortness of breath.   Cardiovascular: Negative for chest pain.  Gastrointestinal: Negative for abdominal pain, diarrhea and vomiting.  Psychiatric/Behavioral: Positive for suicidal ideas (not at present when i ask, but has told nursing otherwise). The patient is nervous/anxious.   All other systems reviewed and are negative.    Physical Exam Updated Vital Signs BP (!) 107/95 (BP Location: Left Arm)   Pulse (!) 113   Temp 98 F (36.7 C) (Oral)   Resp 18   SpO2 95%   Physical Exam  Constitutional: She appears well-developed and well-nourished.  No distress.  Patient woken from sleep for my evaluation  HENT:  Head: Normocephalic and atraumatic.  Eyes: Conjunctivae are normal. Right eye exhibits no discharge. Left eye exhibits no discharge.  Cardiovascular: Normal rate and regular rhythm.  No murmur heard. Pulmonary/Chest: Breath sounds normal. No respiratory distress. She has no wheezes. She has no rales.  Abdominal: Soft. She exhibits no distension. There is no tenderness.  Neurological: She is alert.  Clear speech. Able to ambulate with steady gait.   Skin: Skin is warm and dry. No rash noted.  Psychiatric: She has a normal mood and affect. Her behavior is normal.  Nursing note and vitals reviewed.    ED Treatments / Results  Labs Results for orders placed or performed during the  hospital encounter of 01/17/18  Comprehensive metabolic panel  Result Value Ref Range   Sodium 147 (H) 135 - 145 mmol/L   Potassium 3.6 3.5 - 5.1 mmol/L   Chloride 111 98 - 111 mmol/L   CO2 27 22 - 32 mmol/L   Glucose, Bld 108 (H) 70 - 99 mg/dL   BUN 11 8 - 23 mg/dL   Creatinine, Ser 0.56 0.44 - 1.00 mg/dL   Calcium 9.0 8.9 - 10.3 mg/dL   Total Protein 7.3 6.5 - 8.1 g/dL   Albumin 3.9 3.5 - 5.0 g/dL   AST 21 15 - 41 U/L   ALT 19 0 - 44 U/L   Alkaline Phosphatase 156 (H) 38 - 126 U/L   Total Bilirubin 0.4 0.3 - 1.2 mg/dL   GFR calc non Af Amer >60 >60 mL/min   GFR calc Af Amer >60 >60 mL/min   Anion gap 9 5 - 15  Ethanol  Result Value Ref Range   Alcohol, Ethyl (B) 211 (H) <38 mg/dL  Salicylate level  Result Value Ref Range   Salicylate Lvl <1.8 2.8 - 30.0 mg/dL  Acetaminophen level  Result Value Ref Range   Acetaminophen (Tylenol), Serum <10 (L) 10 - 30 ug/mL  cbc  Result Value Ref Range   WBC 7.0 4.0 - 10.5 K/uL   RBC 4.85 3.87 - 5.11 MIL/uL   Hemoglobin 13.2 12.0 - 15.0 g/dL   HCT 41.1 36.0 - 46.0 %   MCV 84.7 78.0 - 100.0 fL   MCH 27.2 26.0 - 34.0 pg   MCHC 32.1 30.0 - 36.0 g/dL   RDW 15.9 (H) 11.5 - 15.5 %   Platelets 277 150 - 400 K/uL   No results found. EKG None  Radiology No results found.  Procedures Procedures (including critical care time)  Medications Ordered in ED Medications - No data to display   Initial Impression / Assessment and Plan / ED Course  I have reviewed the triage vital signs and the nursing notes.  Pertinent labs & imaging results that were available during my care of the patient were reviewed by me and considered in my medical decision making (see chart for details).   Patient presents to the ED with anxiety/depression and SI requiring medical clearance. Patient nontoxic appearing, in no apparent distress, vitals initially with tachycardia and elevated diastolic blood pressure- normalized on repeat vitals and on my exam. Labs  reviewed and appear fairly at baseline. Mild hypernatremia at 147. Elevated alkaline phosphatase at 156 consistent with previous. EtOh elevated at 211 however patient able to ambulate with steady gait, conversational with me, tolerating fluids. Patient medically cleared for TTS evaluation. Holding orders placed with CIWA protocol if necessary.   Discussed with TTS-  patient has been recommended for inpatient treatment, she has threatened to leave, IVC paperwork has been filled out.   Findings and plan of care discussed with supervising physician Dr. Vanita Panda- in agreement with plan.   Final Clinical Impressions(s) / ED Diagnoses   Final diagnoses:  Suicidal ideations    ED Discharge Orders    None       Leafy Kindle 01/17/18 2238    Carmin Muskrat, MD 01/18/18 [phone]

## 2018-01-17 NOTE — ED Notes (Signed)
SBAR Report received from previous nurse. Pt received calm and visible on unit. Pt denies current HI, A/V H, depression, anxiety, or pain at this time, and appears otherwise stable and free of distress. Pt endorsed SI with a plan to "take whatever"Will continue to assess.

## 2018-01-17 NOTE — ED Notes (Signed)
Patient walked out of ER and said that she was going to walk out in traffic. Security called. Asked to come back in. Escorted by security to TCU.

## 2018-01-17 NOTE — Progress Notes (Signed)
TTS consulted with Tracie Clan, PA who recommends inpt treatment. Pt's nurse Bethena Roys, RN and EDP Petrucelli, Glynda Jaeger, PA-C have been advised. [facility] reviewing for possible admission per [facility].  Lind Covert, MSW, LCSW Therapeutic Triage Specialist  [phone]
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
## 2018-01-17 NOTE — ED Notes (Signed)
Patient arrived to unit and presents with flat affect, tearful at times, but is noted to be cooperative with staff. Patient not given sheet or blanket at this time, due to reports from previous RN of pt wrapping sheet around her neck. When questioned by this writer as to why she did this, pt states "I'm just tired of being here, just tired of everything". Pt did, however did verbally agree to not harm herself at this time. Pt aware of reasons for removal of linens from her room. No distress noted at this time.

## 2018-01-18 ENCOUNTER — Encounter (HOSPITAL_COMMUNITY): Payer: Self-pay | Admitting: Registered Nurse

## 2018-01-18 DIAGNOSIS — F1014 Alcohol abuse with alcohol-induced mood disorder: Secondary | ICD-10-CM

## 2018-01-18 DIAGNOSIS — R45851 Suicidal ideations: Secondary | ICD-10-CM

## 2018-01-18 MED ORDER — AMOXICILLIN-POT CLAVULANATE 875-125 MG PO TABS: 1.0000 | ORAL_TABLET | Freq: Two times a day (BID) | ORAL | 0 refills | Status: DC

## 2018-01-18 NOTE — Discharge Summary (Signed)
Physician Discharge Summary Note  Patient:  Tracie Atkins is an 62 y.o., female MRN:  [id number] DOB:  April 17, 1956 Patient phone:  [phone] (home)  Patient address:   [address] [place] [address],  Total Time spent with patient: 45 minutes  Date of Admission:  01/17/2018 Date of Discharge: 01/18/18  Reason for Admission:  Patient presented to [facility] ED intoxicated with complaints of suicidal ideation and plan to walk into traffic.    Principal Problem: Alcohol abuse with alcohol-induced mood disorder [facility]) Discharge Diagnoses: Patient Active Problem List   Diagnosis Date Noted  . Alcohol abuse with alcohol-induced mood disorder [facility]) [F10.14] 11/20/2016    Past Psychiatric History: Alcohol use disorder; alcohol induced mood disorder  Past Medical History:  Past Medical History:  Diagnosis Date  . Depression   . ETOH abuse     Past Surgical History:  Procedure Laterality Date  . TUBAL LIGATION     Family History: History reviewed. No pertinent family history. Family Psychiatric  History: Denies Social History:  Social History   Substance and Sexual Activity  Alcohol Use Yes  . Alcohol/week: 1.2 oz  . Types: 2 Cans of beer per week     Social History   Substance and Sexual Activity  Drug Use No    Social History   Socioeconomic History  . Marital status: Single    Spouse name: Not on file  . Number of children: Not on file  . Years of education: Not on file  . Highest education level: Not on file  Occupational History  . Not on file  Social Needs  . Financial resource strain: Not on file  . Food insecurity:    Worry: Not on file    Inability: Not on file  . Transportation needs:    Medical: Not on file    Non-medical: Not on file  Tobacco Use  . Smoking status: Never Smoker  . Smokeless tobacco: Never Used  Substance and Sexual Activity  . Alcohol use: Yes    Alcohol/week: 1.2 oz    Types: 2 Cans of beer per week  . Drug use: No  . Sexual  activity: Never  Lifestyle  . Physical activity:    Days per week: Not on file    Minutes per session: Not on file  . Stress: Not on file  Relationships  . Social connections:    Talks on phone: Not on file    Gets together: Not on file    Attends religious service: Not on file    Active member of club or organization: Not on file    Attends meetings of clubs or organizations: Not on file    Relationship status: Not on file  Other Topics Concern  . Not on file  Social History Narrative  . Not on file    Hospital Course:  Tracie Atkins 63 y.o., female presented to ED voluntary with complaints of depression and suicidal ideation. Patient monitored overnight for stabilized.  Today patient is alert/oriented, calm/cooperative.  Patient denies suicidal/homicidal ideation, thoughts of self injury, psychosis, and paranoia.  Patient reports that she was drunk when came to the hospital and any statements she made was because she was in toxicated.  This morning patient denies suicidal/self-harm/homicidal ideation, psychosis, and paranoia.  Patient states that she is interested in getting outpatient services to assist her with her alcohol use problem.  There are no withdrawal symptoms and patient is stable for discharge.  Patient psychiatrically cleared  Physical Findings: AIMS:  , ,  ,  ,    CIWA:  CIWA-Ar Total: 0 COWS:     Musculoskeletal: Strength & Muscle Tone: within normal limits Gait & Station: normal Patient leans: N/A  Psychiatric Specialty Exam: Physical Exam  Constitutional: She is oriented to person, place, and time. She appears well-nourished.  Neck: Normal range of motion. Neck supple.  Respiratory: Effort normal.  Musculoskeletal: Normal range of motion.  Neurological: She is alert and oriented to person, place, and time.  Skin: Skin is warm and dry.    ROS  Blood pressure 137/86, pulse 88, temperature 98.1 F (36.7 C), temperature source Oral, resp. rate 16, SpO2 97  %.There is no height or weight on file to calculate BMI.  General Appearance: Casual  Eye Contact:  Good  Speech:  Clear and Coherent and Normal Rate  Volume:  Normal  Mood:  Appropriate  Affect:  Appropriate  Thought Process:  Coherent and Goal Directed  Orientation:  Full (Time, Place, and Person)  Thought Content:  Logical  Suicidal Thoughts:  No  Homicidal Thoughts:  No  Memory:  Immediate;   Good Recent;   Good Remote;   Good  Judgement:  Intact  Insight:  Present  Psychomotor Activity:  Normal  Concentration:  Concentration: Good and Attention Span: Good  Recall:  Good  Fund of Knowledge:  Good  Language:  Good  Akathisia:  No  Handed:  Right  AIMS (if indicated):     Assets:  Communication Skills Desire for [place]  ADL's:  Intact  Cognition:  WNL  Sleep:           Has this patient used any form of tobacco in the last 30 days? (Cigarettes, Smokeless Tobacco, Cigars, and/or Pipes) Yes, Yes, A prescription for an FDA-approved tobacco cessation medication was offered at discharge and the patient refused  Blood Alcohol level:  Lab Results  Component Value Date   ETH 211 (H) 01/17/2018   ETH 154 (H) 27/74/1287    Metabolic Disorder Labs:  No results found for: HGBA1C, MPG No results found for: PROLACTIN No results found for: CHOL, TRIG, HDL, CHOLHDL, VLDL, LDLCALC  See Psychiatric Specialty Exam and Suicide Risk Assessment completed by Attending Physician prior to discharge.  Discharge destination:  Home  Is patient on multiple antipsychotic therapies at discharge:  No   Has Patient had three or more failed trials of antipsychotic monotherapy by history:  No  Recommended Plan for Multiple Antipsychotic Therapies: NA  Discharge Instructions    Diet - low sodium heart healthy   Complete by:  As directed    Discharge instructions   Complete by:  As directed    Take all of you medications as prescribed by your  mental healthcare provider.  Report any adverse effects and reactions from your medications to your outpatient provider promptly.  Do not engage in alcohol and or illegal drug use while on prescription medicines. Keep all scheduled appointments. This is to ensure that you are getting refills on time and to avoid any interruption in your medication.  If you are unable to keep an appointment call to reschedule.  Be sure to follow up with resources and follow ups given. In the event of worsening symptoms call the crisis hotline, 911, and or go to the nearest emergency department for appropriate evaluation and treatment of symptoms. Follow-up with your primary care provider for your medical issues, concerns and or health care needs.  Increase activity slowly   Complete by:  As directed      No current facility-administered medications on file prior to encounter.    No current outpatient medications on file prior to encounter.      Follow-up recommendations:  Activity:  As tolerated Diet:  Heart healthy Other:  Follow up with [facility]  Comments:  Tracie Atkins has been instructed to take medications as prescribed; and report adverse effects to outpatient provider.  Follow up with primary doctor for any medical issues and If symptoms recur report to nearest emergency or crisis hot line.    SignedEarleen Newport, NP 01/18/2018, 11:18 AM

## 2018-01-18 NOTE — BH Assessment (Addendum)
[facility] Assessment Progress Note  Per Hampton Abbot, MD, this pt does not require psychiatric hospitalization at this time.  Pt presents under IVC initiated by EDP Carmin Muskrat, MD, which Dr Dwyane Dee has rescinded.  Pt is to be discharged from [facility] with recommendation to follow up with [facility].  This has been included in pt's discharge instructions.  Pt would also benefit from seeing Peer Support Specialists; they will be asked to speak to pt.  Pt's nurse, Diane, has been notified.  Jalene Mullet, Bee Triage Specialist [phone]

## 2018-01-18 NOTE — BHH Suicide Risk Assessment (Cosign Needed)
Suicide Risk Assessment  Discharge Assessment   [facility] Discharge Suicide Risk Assessment   Principal Problem: Alcohol abuse with alcohol-induced mood disorder [facility]) Discharge Diagnoses:  Patient Active Problem List   Diagnosis Date Noted  . Alcohol abuse with alcohol-induced mood disorder ([place]) [F10.14] 11/20/2016    Total Time spent with patient: 30 minutes  Musculoskeletal: Strength & Muscle Tone: within normal limits Gait & Station: normal Patient leans: N/A  Physical Findings: AIMS:  , ,  ,  ,    CIWA:  CIWA-Ar Total: 0 COWS:     Musculoskeletal: Strength & Muscle Tone: within normal limits Gait & Station: normal Patient leans: N/A  Psychiatric Specialty Exam: Physical Exam  Constitutional: She is oriented to person, place, and time. She appears well-nourished.  Neck: Normal range of motion. Neck supple.  Respiratory: Effort normal.  Musculoskeletal: Normal range of motion.  Neurological: She is alert and oriented to person, place, and time.  Skin: Skin is warm and dry.    ROS  Blood pressure 137/86, pulse 88, temperature 98.1 F (36.7 C), temperature source Oral, resp. rate 16, SpO2 97 %.There is no height or weight on file to calculate BMI.  General Appearance: Casual  Eye Contact:  Good  Speech:  Clear and Coherent and Normal Rate  Volume:  Normal  Mood:  Appropriate  Affect:  Appropriate  Thought Process:  Coherent and Goal Directed  Orientation:  Full (Time, Place, and Person)  Thought Content:  Logical  Suicidal Thoughts:  No  Homicidal Thoughts:  No  Memory:  Immediate;   Good Recent;   Good Remote;   Good  Judgement:  Intact  Insight:  Present  Psychomotor Activity:  Normal  Concentration:  Concentration: Good and Attention Span: Good  Recall:  Good  Fund of Knowledge:  Good  Language:  Good  Akathisia:  No  Handed:  Right  AIMS (if indicated):     Assets:  Communication Skills Desire for Improvement Housing Physical Health Social  Support  ADL's:  Intact  Cognition:  WNL  Sleep:        Mental Status Per Nursing Assessment::   On Admission:        Jeslynn Hollander 62 y.o., female presented to ED voluntary with complaints of depression and suicidal ideation. Patient monitored overnight for stabilized.  Today patient is alert/oriented, calm/cooperative.  Patient denies suicidal/homicidal ideation, thoughts of self injury, psychosis, and paranoia.  Patient reports that she was drunk when came to the hospital and any statements she made was because she was in toxicated.  This morning patient denies suicidal/self-harm/homicidal ideation, psychosis, and paranoia.  Patient states that she is interested in getting outpatient services to assist her with her alcohol use problem.  There are no withdrawal symptoms and patient is stable for discharge.  Patient psychiatrically cleared   Demographic Factors:  Caucasian  Loss Factors: NA  Historical Factors: Alcohol abuse  Risk Reduction Factors:   Sense of responsibility to family, Living with another person, especially a relative and Positive social support  Continued Clinical Symptoms:  Alcohol/Substance Abuse/Dependencies  Cognitive Features That Contribute To Risk:  None    Suicide Risk:  Minimal: No identifiable suicidal ideation.  Patients presenting with no risk factors but with morbid ruminations; may be classified as minimal risk based on the severity of the depressive symptoms    Plan Of Care/Follow-up recommendations:  Activity:  As tolerated Other:  Follow up with [facility] Celica Kotowski, NP 01/18/2018, 11:20 AM

## 2018-01-18 NOTE — ED Notes (Signed)
Pt discharged home. Discharged instructions read to pt who verbalized understanding. All belongings returned to pt who signed for same. Denies SI/HI, is not delusional and not responding to internal stimuli. Escorted pt to the ED exit.    

## 2018-01-18 NOTE — Discharge Instructions (Signed)
For your behavioral health needs you are advised to follow up with [facility].  New patients are seen at their walk-in clinic.  Walk-in hours are Monday - Friday from 8:00 am - 12:00 pm, and from 1:00 pm - 3:00 pm.  Walk-in patients are seen on a first come, first served basis, so try to arrive as early as possible for the best chance of being seen the same day.  There is an initial fee of $22.50: ° °     [facility] °     [address] °     [place], [place] [address] °     [phone] °

## 2018-01-18 NOTE — Patient Outreach (Signed)
ED Peer Support Specialist Patient Intake (Complete at intake & 30-60 Day Follow-up)  Name: Tracie Atkins  MRN: [id number]  Age: 62 y.o.   Date of Admission: 01/18/2018  Intake: Initial Comments:      Primary Reason Admitted: depression, SI, alcohol use  Lab values: Alcohol/ETOH: Positive Positive UDS? No Amphetamines: No Barbiturates: No Benzodiazepines: No Cocaine: No Opiates: No Cannabinoids: No  Demographic information: Gender: Female Ethnicity: White Marital Status: Single Insurance Status: Uninsured/Self-pay(waiting for [employer] or [employer] to switch over) Ecologist (Work Neurosurgeon, Physicist, medical, Social research officer, government.: Yes(SSI) Lives with: Friend/Rommate Living situation: House/Apartment  Reported Patient History: Patient reported health conditions: Depression Patient aware of HIV and hepatitis status: No  In past year, has patient visited ED for any reason? Yes  Number of ED visits: 2  Reason(s) for visit: alcohol use  In past year, has patient been hospitalized for any reason? No  Number of hospitalizations:    Reason(s) for hospitalization:    In past year, has patient been arrested? No  Number of arrests:    Reason(s) for arrest:    In past year, has patient been incarcerated? No  Number of incarcerations:    Reason(s) for incarceration:    In past year, has patient received medication-assisted treatment? No  In past year, patient received the following treatments:    In past year, has patient received any harm reduction services? No  Did this include any of the following?    In past year, has patient received care from a mental health provider for diagnosis other than SUD? No  In past year, is this first time patient has overdosed? (has not overdosed)  Number of past overdoses:    In past year, is this first time patient has been hospitalized for an overdose? (has not overdosed)  Number of hospitalizations for  overdose(s):    Is patient currently receiving treatment for a mental health diagnosis? No  Patient reports experiencing difficulty participating in SUD treatment: No    Most important reason(s) for this difficulty?    Has patient received prior services for treatment? No  In past, patient has received services from following agencies:    Plan of Care:  Suggested follow up at these agencies/treatment centers: (Patient is interested in help with outpatient substance use treatment. CPSS will provide and explain information regarding those resources. )  Other information: CPSS met with the patient to provide substance use recovery support and help with recovery resources. CPSS provided information for outpatient substance use treatment resources including information for [facility], [facility], [place] program, AA/NA meeting list, and [facility]PSS contact information. CPSS strongly encouraged the patient to contact CPSS for further help getting connected with outpatient resources after discharge.    Mason Jim, CPSS  01/18/2018 12:04 PM

## 2018-01-18 NOTE — ED Notes (Signed)
Pt is alert and oriented x4. She said her behaviors and feelings yesterday were related to intoxication at that time. Today she is appropriate, calm and cooperative. Denies SI/HI/AVH.

## 2018-02-25 ENCOUNTER — Encounter (HOSPITAL_COMMUNITY): Payer: Self-pay

## 2018-02-25 ENCOUNTER — Emergency Department (HOSPITAL_COMMUNITY)
Admission: EM | Admit: 2018-02-25 | Discharge: 2018-02-25 | Disposition: A | Discharge status: Home / Self Care | Payer: Medicare Other | Attending: Emergency Medicine | Admitting: Emergency Medicine

## 2018-02-25 ENCOUNTER — Other Ambulatory Visit: Payer: Self-pay

## 2018-02-25 DIAGNOSIS — Y69 Unspecified misadventure during surgical and medical care: Secondary | ICD-10-CM | POA: Insufficient documentation

## 2018-02-25 DIAGNOSIS — T8130XA Disruption of wound, unspecified, initial encounter: Secondary | ICD-10-CM | POA: Insufficient documentation

## 2018-02-25 DIAGNOSIS — K0889 Other specified disorders of teeth and supporting structures: Secondary | ICD-10-CM | POA: Diagnosis present

## 2018-02-25 NOTE — ED Triage Notes (Signed)
Patient states she had teeth removed last week and has a stitch remaining where the front teeth were. Patient states every time she eats her bottom tooth catches the stitch and pulls it, causing pain.

## 2018-02-25 NOTE — Discharge Instructions (Signed)
Follow-up with your dentist, as planned.

## 2018-02-25 NOTE — ED Provider Notes (Signed)
[facility] DEPT Provider Note   CSN: [id number] Arrival date & time: 02/25/18  1533     History   Chief Complaint Chief Complaint  Patient presents with  . gum pain    HPI Tracie Atkins is a 62 y.o. female.  HPI   Tracie Atkins is a 62 y.o. female, with a history of depression and EtOH use, presenting to the ED for suture removal.  Patient states she had dental extraction performed last week.  All of the sutures have fallen out except for one.  This 1 has loosened, dangling in the patient's mouth, and is catching on her remaining teeth. States she called her dentist office and was told to come to the ED to have [place] remove the suture. Denies fever, increased pain, swelling.   Past Medical History:  Diagnosis Date  . Depression   . ETOH abuse     Patient Active Problem List   Diagnosis Date Noted  . Suicidal ideations   . Alcohol abuse with alcohol-induced mood disorder ([place]) 11/20/2016    Past Surgical History:  Procedure Laterality Date  . DENTAL SURGERY    . TUBAL LIGATION       OB History    Gravida  3   Para  3   Term  3   Preterm      AB      Living  3     SAB      TAB      Ectopic      Multiple      Live Births               Home Medications    Prior to Admission medications   Medication Sig Start Date End Date Taking? Authorizing Provider  amoxicillin-clavulanate (AUGMENTIN) 875-125 MG tablet Take 1 tablet by mouth 2 (two) times daily. 01/18/18   Rankin, Mercy Moore, NP    Family History History reviewed. No pertinent family history.  Social History Social History   Tobacco Use  . Smoking status: Never Smoker  . Smokeless tobacco: Never Used  Substance Use Topics  . Alcohol use: Not Currently    Alcohol/week: 2.0 standard drinks    Types: 2 Cans of beer per week  . Drug use: No     Allergies   Patient has no known allergies.   Review of Systems Review of Systems  Constitutional:  Negative for fever.  HENT: Negative for facial swelling.        Loose suture    Physical Exam Updated Vital Signs BP (!) 144/84 (BP Location: Left Arm)   Pulse 76   Temp 97.9 F (36.6 C) (Oral)   Resp 18   Ht 5\' 4"  (1.626 m)   Wt 84.4 kg   SpO2 98%   BMI 31.93 kg/m   Physical Exam  Constitutional: She appears well-developed and well-nourished. No distress.  HENT:  Head: Normocephalic and atraumatic.  Loose suture in the patient's front maxillary gum.  The suture is still looped through part of the gum, but there is no tight knot in the ends are simply dangling.  No noted swelling, erythema, or hemorrhage.  Eyes: Conjunctivae are normal.  Neck: Neck supple.  Cardiovascular: Normal rate and regular rhythm.  Pulmonary/Chest: Effort normal.  Neurological: She is alert.  Skin: Skin is warm and dry. She is not diaphoretic. No pallor.  Psychiatric: She has a normal mood and affect. Her behavior is normal.  Nursing note and vitals  reviewed.          ED Treatments / Results  Labs (all labs ordered are listed, but only abnormal results are displayed) Labs Reviewed - No data to display  EKG None  Radiology No results found.  Procedures .Suture Removal Date/Time: 02/25/2018 6:08 PM Performed by: Lorayne Bender, PA-C Authorized by: Lorayne Bender, PA-C   Consent:    Consent obtained:  Verbal   Consent given by:  Patient   Risks discussed:  Bleeding, pain and wound separation Location:    Location:  Mouth   Lip location: Upper gum. Procedure details:    Wound appearance:  No signs of infection and clean   Number of sutures removed:  1 Post-procedure details:    Post-removal:  No dressing applied   Patient tolerance of procedure:  Tolerated well, no immediate complications   (including critical care time)  Medications Ordered in ED Medications - No data to display   Initial Impression / Assessment and Plan / ED Course  I have reviewed the triage vital signs  and the nursing notes.  Pertinent labs & imaging results that were available during my care of the patient were reviewed by me and considered in my medical decision making (see chart for details).     The loose suture was removed without hemorrhage or wound dehiscence. Patient will follow-up with her dentist, as planned.  Final Clinical Impressions(s) / ED Diagnoses   Final diagnoses:  Extrusion of suture, initial encounter    ED Discharge Orders    None       [name] 02/25/18 2214    Milton Ferguson, MD 02/25/18 2319

## 2018-02-25 NOTE — ED Notes (Signed)
Bed: [facility]PT3 Expected date:  Expected time:  Means of arrival:  Comments: 

## 2018-03-20 ENCOUNTER — Emergency Department (HOSPITAL_COMMUNITY)
Admission: EM | Admit: 2018-03-20 | Discharge: 2018-03-20 | Disposition: A | Discharge status: Home / Self Care | Payer: Medicare Other | Attending: Emergency Medicine | Admitting: Emergency Medicine

## 2018-03-20 ENCOUNTER — Encounter (HOSPITAL_COMMUNITY): Payer: Self-pay | Admitting: *Deleted

## 2018-03-20 ENCOUNTER — Emergency Department (HOSPITAL_COMMUNITY): Payer: Medicare Other

## 2018-03-20 ENCOUNTER — Emergency Department (HOSPITAL_BASED_OUTPATIENT_CLINIC_OR_DEPARTMENT_OTHER): Payer: Medicare Other

## 2018-03-20 ENCOUNTER — Other Ambulatory Visit: Payer: Self-pay

## 2018-03-20 DIAGNOSIS — M79662 Pain in left lower leg: Secondary | ICD-10-CM | POA: Diagnosis not present

## 2018-03-20 DIAGNOSIS — R072 Precordial pain: Secondary | ICD-10-CM | POA: Insufficient documentation

## 2018-03-20 DIAGNOSIS — R0600 Dyspnea, unspecified: Secondary | ICD-10-CM | POA: Diagnosis not present

## 2018-03-20 DIAGNOSIS — M79609 Pain in unspecified limb: Secondary | ICD-10-CM | POA: Diagnosis not present

## 2018-03-20 DIAGNOSIS — R079 Chest pain, unspecified: Secondary | ICD-10-CM

## 2018-03-20 DIAGNOSIS — R51 Headache: Secondary | ICD-10-CM | POA: Diagnosis not present

## 2018-03-20 HISTORY — DX: Periapical abscess without sinus: K04.7

## 2018-03-20 LAB — I-STAT TROPONIN, ED
TROPONIN I, POC: 0 ng/mL (ref 0.00–0.08)
Troponin i, poc: 0 ng/mL (ref 0.00–0.08)

## 2018-03-20 LAB — CBC
HCT: 40.1 % (ref 36.0–46.0)
Hemoglobin: 12.4 g/dL (ref 12.0–15.0)
MCH: 27 pg (ref 26.0–34.0)
MCHC: 30.9 g/dL (ref 30.0–36.0)
MCV: 87.2 fL (ref 78.0–100.0)
Platelets: 248 10*3/uL (ref 150–400)
RBC: 4.6 MIL/uL (ref 3.87–5.11)
RDW: 14.8 % (ref 11.5–15.5)
WBC: 8.4 10*3/uL (ref 4.0–10.5)

## 2018-03-20 LAB — BASIC METABOLIC PANEL
ANION GAP: 8 (ref 5–15)
BUN: 13 mg/dL (ref 8–23)
CHLORIDE: 104 mmol/L (ref 98–111)
CO2: 27 mmol/L (ref 22–32)
Calcium: 9 mg/dL (ref 8.9–10.3)
Creatinine, Ser: 0.62 mg/dL (ref 0.44–1.00)
GFR calc Af Amer: >60 mL/min (ref >60)
GLUCOSE: 89 mg/dL (ref 70–99)
POTASSIUM: 4.3 mmol/L (ref 3.5–5.1)
SODIUM: 139 mmol/L (ref 135–145)

## 2018-03-20 LAB — D-DIMER, QUANTITATIVE: D-Dimer, Quant: 0.41 ug/mL-FEU (ref 0.00–0.50)

## 2018-03-20 IMAGING — CR DG CHEST 2V
2 series · 2 of 2 positions shown · non-contrast
Comparison: [DATE]

CLINICAL DATA: Chest pain and shortness of breath

EXAM:
CHEST - 2 VIEW

[chest pa]
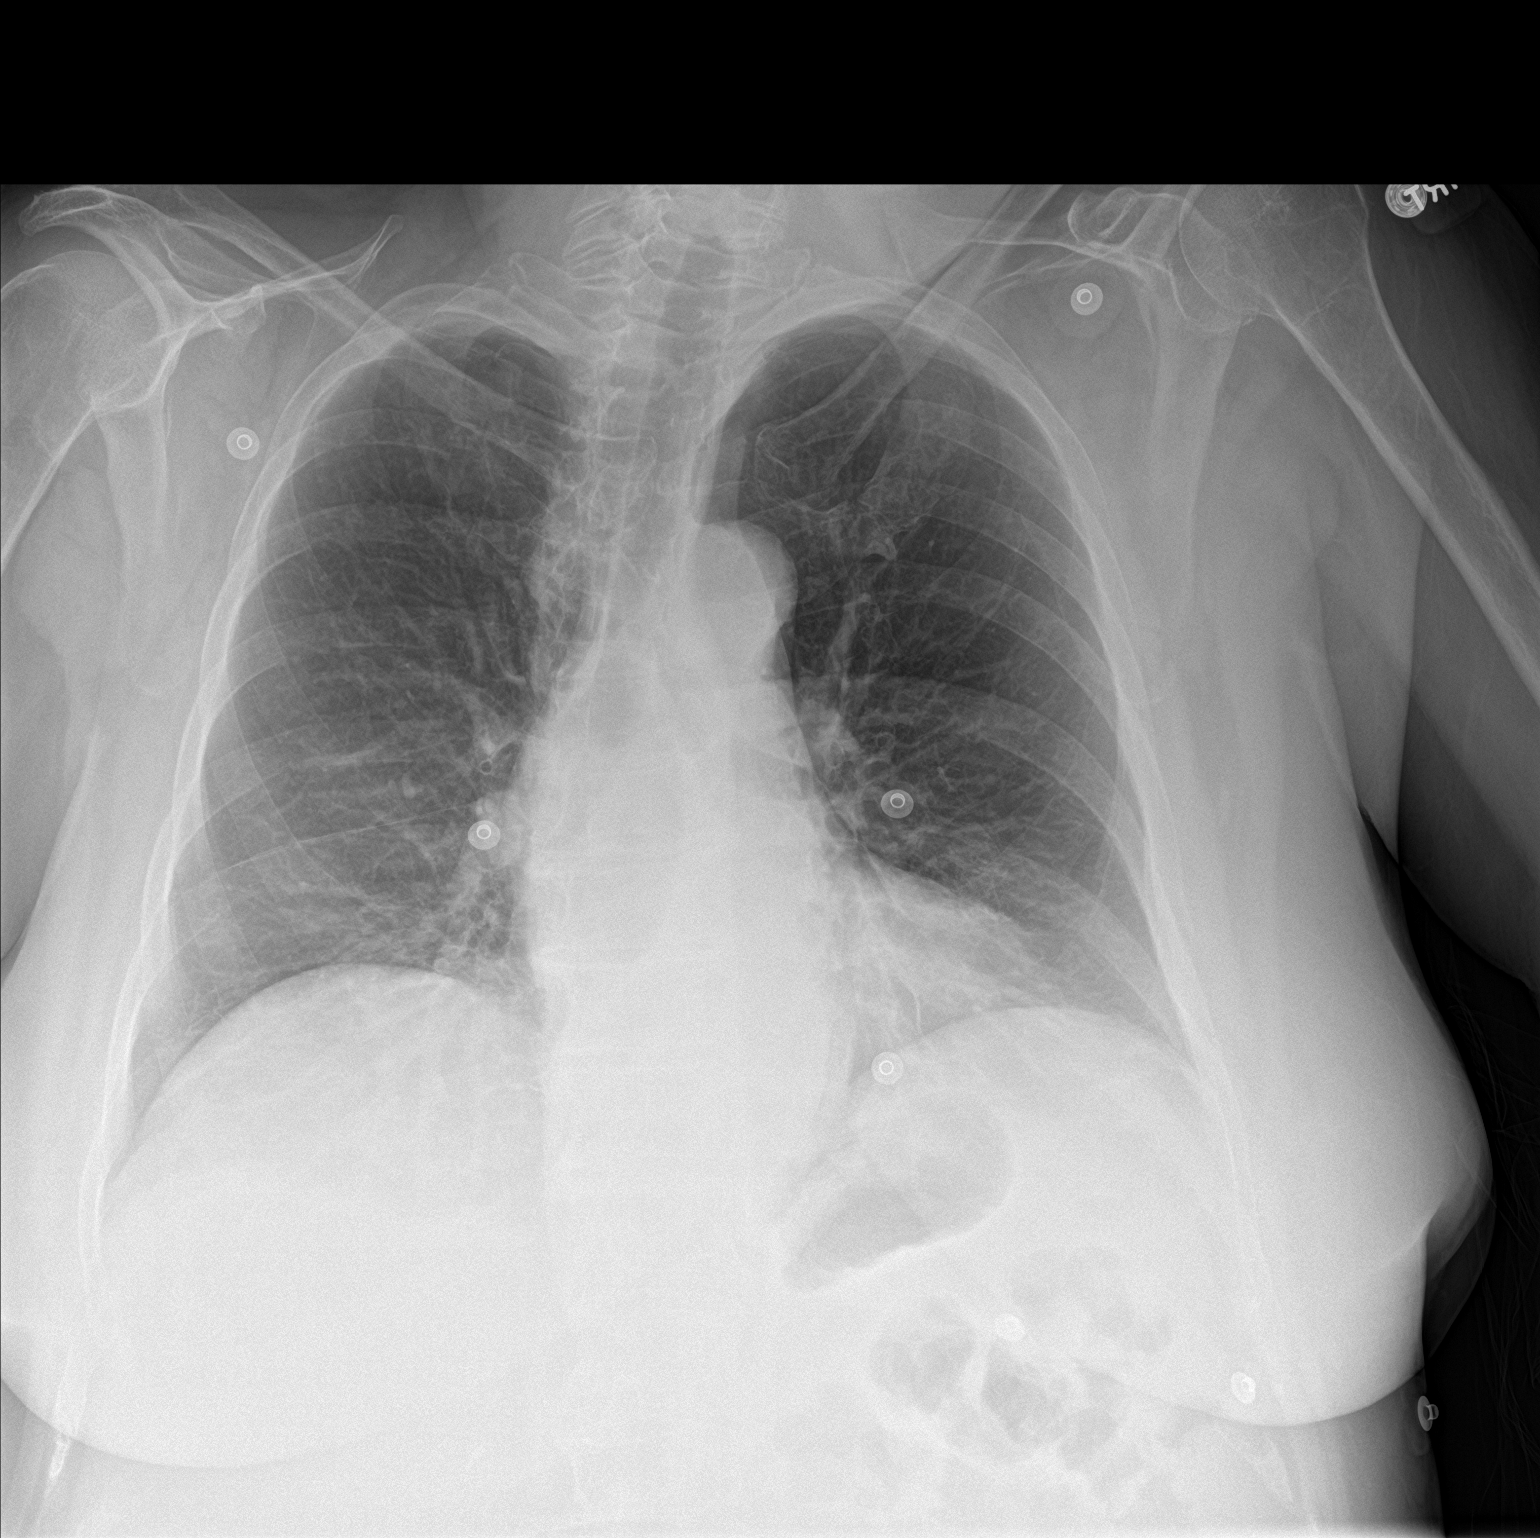

[chest lat]
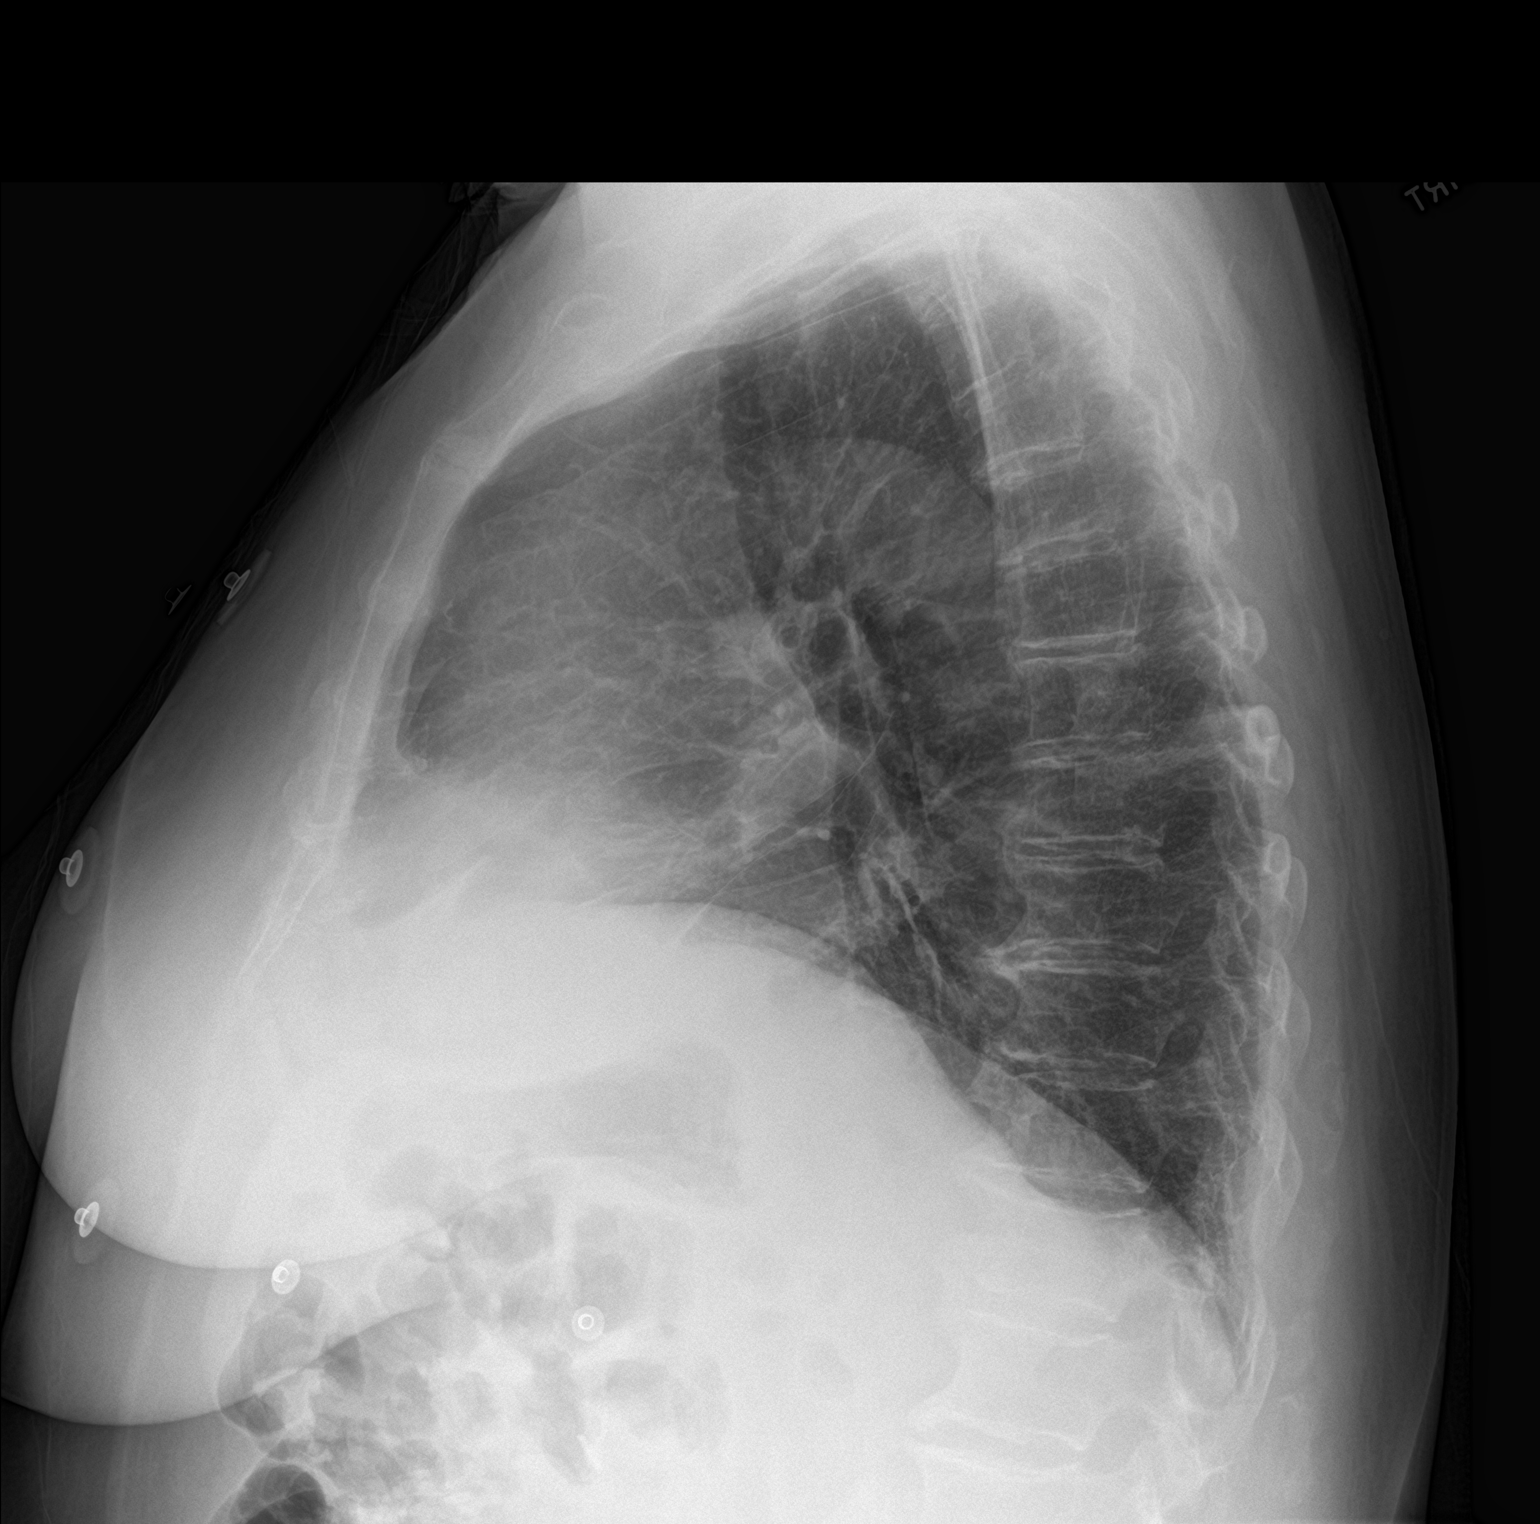

[2 of 2 positions shown; findings below may reference images not displayed]

FINDINGS: Cardiac shadow is stable. Small hiatal hernia is again seen. The
lungs are well aerated bilaterally. No focal infiltrate is noted.
Mild scarring is noted in the right lung base similar to that seen
on the prior exam. No acute focal infiltrate or sizable effusion is
seen. Degenerative changes of the thoracic spine are noted.
IMPRESSION: Chronic changes in the right lung base.  No acute abnormality noted.

## 2018-03-20 MED ORDER — GI COCKTAIL ~~LOC~~
30.0000 mL | Freq: Once | ORAL | Status: AC
  Administered 2018-03-20: 30 mL via ORAL
  Filled 2018-03-20: qty 30

## 2018-03-20 MED ORDER — ACETAMINOPHEN 325 MG PO TABS
650.0000 mg | ORAL_TABLET | Freq: Once | ORAL | Status: AC
  Administered 2018-03-20: 650 mg via ORAL
  Filled 2018-03-20: qty 2

## 2018-03-20 NOTE — Discharge Instructions (Addendum)
You were seen in the emergency department today for chest pain. Your work-up in the emergency department has been overall reassuring. Your labs have been fairly normal and or similar to previous blood work you have had done. Your EKG and the enzyme we use to check your heart did not show an acute heart attack at this time. Your chest x-ray did not show a collapsed lung or infection. We checked for a blood clot and this was negative.   We would like you to follow up closely with your primary care provider and/or the cardiologist provided in your discharge instructions within 1 week. Return to the ER immediately should you experience any new or worsening symptoms including but not limited to return of pain, worsened pain, vomiting, shortness of breath, dizziness, lightheadedness, passing out, or any other concerns that you may have.

## 2018-03-20 NOTE — ED Notes (Addendum)
Patient ambulated with no complaints. Patients O2 sat is 93% while ambulating.

## 2018-03-20 NOTE — ED Notes (Signed)
Declined W/C at D/C and was escorted to lobby by RN. 

## 2018-03-20 NOTE — ED Provider Notes (Signed)
[facility] EMERGENCY DEPARTMENT Provider Note   CSN: [id number] Arrival date & time: 03/20/18  5366   History   Chief Complaint Chief Complaint  Patient presents with  . Chest Pain    HPI Tracie Atkins is a 62 y.o. female with a hx of depression and EtOH abuse who presents to the ED with complaints of intermittent chest pain since 0400 this AM. Patient describes the pain as sharp, substernal- non radiating. She has had 4-5 episodes of pain since onset this morning, first episode woke her from sleep. She states that the pain lasts about 10 minutes- no specific triggers. It does not occur with exertion. She states it is worse when she takes a deep breath when its happening. She states that she called EMS and upon arrival she developed a gradual onset, steady progression headache to the top of her head, hx of similar headaches. She states she generally does not feel well. She does not have associated dyspnea with the pain but does not some dyspnea with activity over the past week or so. Denies nausea, vomiting, syncope. Reports LLE cramping at night for past few days, no swelling. Denies hemoptysis, recent surgery/trauma, recent long travel, hormone use, personal hx of cancer, or hx of DVT/PE. Denies ETOH or drug use today. No recent drug use in general.   HPI  Past Medical History:  Diagnosis Date  . Chronic dental infection 8/189   multiple teeth removed and Post -op infection.  . Depression   . ETOH abuse     Patient Active Problem List   Diagnosis Date Noted  . Suicidal ideations   . Alcohol abuse with alcohol-induced mood disorder ([place]) 11/20/2016    Past Surgical History:  Procedure Laterality Date  . DENTAL SURGERY    . TUBAL LIGATION       OB History    Gravida  3   Para  3   Term  3   Preterm      AB      Living  3     SAB      TAB      Ectopic      Multiple      Live Births               Home Medications    Prior to  Admission medications   Medication Sig Start Date End Date Taking? Authorizing Provider  amoxicillin-clavulanate (AUGMENTIN) 875-125 MG tablet Take 1 tablet by mouth 2 (two) times daily. 01/18/18   Rankin, Mercy Moore, NP    Family History History reviewed. No pertinent family history.  Social History Social History   Tobacco Use  . Smoking status: Never Smoker  . Smokeless tobacco: Never Used  Substance Use Topics  . Alcohol use: Not Currently    Alcohol/week: 2.0 standard drinks    Types: 2 Cans of beer per week  . Drug use: No     Allergies   Patient has no known allergies.   Review of Systems Review of Systems  Constitutional: Negative for chills and fever.  Eyes: Negative for visual disturbance.  Respiratory: Positive for shortness of breath (not at present).   Cardiovascular: Positive for chest pain. Negative for palpitations and leg swelling.  Gastrointestinal: Negative for abdominal pain, nausea and vomiting.  Musculoskeletal: Positive for myalgias (LLE at night).  Neurological: Positive for headaches. Negative for dizziness, weakness and numbness.  All other systems reviewed and are negative.  Physical Exam Updated Vital Signs  BP 121/78 (BP Location: Right Arm)   Pulse 82   Temp 98 F (36.7 C) (Oral)   Resp 18   Ht 5\' 4"  (1.626 m)   Wt 85.7 kg   SpO2 98%   BMI 32.44 kg/m   Physical Exam  Constitutional: She appears well-developed and well-nourished.  Non-toxic appearance. No distress.  HENT:  Head: Normocephalic and atraumatic.  Eyes: Conjunctivae are normal. Right eye exhibits no discharge. Left eye exhibits no discharge.  Neck: Neck supple.  Cardiovascular: Normal rate and regular rhythm.  No murmur heard. Pulses:      Radial pulses are 2+ on the right side, and 2+ on the left side.       Dorsalis pedis pulses are 2+ on the right side, and 2+ on the left side.  Pulmonary/Chest: Effort normal and breath sounds normal. No respiratory distress. She has  no wheezes. She has no rhonchi. She has no rales. She exhibits tenderness (substernal without overlying erythema/ecchymosis/wounds, no underlying crepitus.).  Respiration even and unlabored  Abdominal: Soft. She exhibits no distension. There is no tenderness.  Musculoskeletal:       Right lower leg: She exhibits no tenderness and no edema.       Left lower leg: She exhibits no tenderness and no edema.  Neurological: She is alert.  Clear speech.  CN III through XII grossly intact.  Normal finger-nose.  Negative Prater drift.  Sensation grossly intact bilateral upper and lower extremities. 5/5 symmetric grip strength. 5/5 symmetric strength with plantar/dorsiflexion. Ambulatory with steady gait.   Skin: Skin is warm and dry. No rash noted.  Psychiatric: She has a normal mood and affect. Her behavior is normal.  Nursing note and vitals reviewed.   ED Treatments / Results  Labs Results for orders placed or performed during the hospital encounter of 87/56/43  Basic metabolic panel  Result Value Ref Range   Sodium 139 135 - 145 mmol/L   Potassium 4.3 3.5 - 5.1 mmol/L   Chloride 104 98 - 111 mmol/L   CO2 27 22 - 32 mmol/L   Glucose, Bld 89 70 - 99 mg/dL   BUN 13 8 - 23 mg/dL   Creatinine, Ser 0.62 0.44 - 1.00 mg/dL   Calcium 9.0 8.9 - 10.3 mg/dL   GFR calc non Af Amer >60 >60 mL/min   GFR calc Af Amer >60 >60 mL/min   Anion gap 8 5 - 15  CBC  Result Value Ref Range   WBC 8.4 4.0 - 10.5 K/uL   RBC 4.60 3.87 - 5.11 MIL/uL   Hemoglobin 12.4 12.0 - 15.0 g/dL   HCT 40.1 36.0 - 46.0 %   MCV 87.2 78.0 - 100.0 fL   MCH 27.0 26.0 - 34.0 pg   MCHC 30.9 30.0 - 36.0 g/dL   RDW 14.8 11.5 - 15.5 %   Platelets 248 150 - 400 K/uL  D-dimer, quantitative (not at [facility])  Result Value Ref Range   D-Dimer, Quant 0.41 0.00 - 0.50 ug/mL-FEU  I-stat troponin, ED  Result Value Ref Range   Troponin i, poc 0.00 0.00 - 0.08 ng/mL   Comment 3          I-stat troponin, ED  Result Value Ref Range    Troponin i, poc 0.00 0.00 - 0.08 ng/mL   Comment 3           EKG EKG Interpretation  Date/Time:  Saturday March 20 2018 09:48:21 EDT Ventricular Rate:  85 PR Interval:  QRS Duration: 80 QT Interval:  389 QTC Calculation: 463 R Axis:   20 Text Interpretation:  Sinus rhythm Abnormal R-wave progression, early transition When compared to prior, no signifiacnt changes seen,  No STEMI Confirmed by Antony Blackbird [phone]) on 03/20/2018 9:55:21 AM   Radiology Dg Chest 2 View  Result Date: 03/20/2018 CLINICAL DATA:  Chest pain and shortness of breath EXAM: CHEST - 2 VIEW COMPARISON:  08/22/2016 FINDINGS: Cardiac shadow is stable. Small hiatal hernia is again seen. The lungs are well aerated bilaterally. No focal infiltrate is noted. Mild scarring is noted in the right lung base similar to that seen on the prior exam. No acute focal infiltrate or sizable effusion is seen. Degenerative changes of the thoracic spine are noted. IMPRESSION: Chronic changes in the right lung base.  No acute abnormality noted. Electronically Signed   By: Inez Catalina M.D.   On: 03/20/2018 10:33    Procedures Procedures (including critical care time)  Medications Ordered in ED Medications  acetaminophen (TYLENOL) tablet 650 mg (650 mg Oral Given 03/20/18 1112)  gi cocktail (Maalox,Lidocaine,Donnatal) (30 mLs Oral Given 03/20/18 1351)   Initial Impression / Assessment and Plan / ED Course  I have reviewed the triage vital signs and the nursing notes.  Pertinent labs & imaging results that were available during my care of the patient were reviewed by me and considered in my medical decision making (see chart for details).    Patient presents to the emergency department with chest pain. Patient nontoxic appearing, in no apparent distress, initial vitals without significant abnormality. Fairly benign physical exam, some reproducibility of pain with chest wall palpation. Patient additionally mentioned mild headache,  similar to prior, no neuro deficits or red flags. Regarding chief complaint: DDX: ACS, pulmonary embolism, dissection, pneumothorax, effusion, infiltrate, arrhythmia, anemia, electrolyte derangement, MSK, GERD. Evaluation initiated with labs, EKG, and CXR. Patient on cardiac monitor. Tylenol for pain ordered.   Work-up in the ER unremarkable. Labs reviewed, no leukocytosis, anemia, or significant electrolyte abnormality. CXR without infiltrate, effusion, pneumothorax, or fracture/dislocation, does have chronic changes to R lung base.   Low risk heart score, EKG without significant change from prior, delta troponin negative, doubt ACS. Given hx of dyspnea with activity and some vague LLE pain- venous doppler of LLE obtained and negative for DVT, d-dimer negative, doubt pulmonary embolism.  Pain is not a tearing sensation, symmetric pulses, no widening of mediastinum on CXR, doubt dissection. Cardiac monitor reviewed, no notable arrhythmias or tachycardia. Patient ambulated in the emergency department with SPO2 remaining >90%, asymptomatic. On re-evaluation she states she is feeling much better and ready to go home, headache and chest pain resolved. Patient has appeared hemodynamically stable throughout ER visit and appears safe for discharge with close PCP/cardiology follow up. I discussed results, treatment plan, need for follow-up, and return precautions with the patient. Provided opportunity for questions, patient confirmed understanding and is in agreement with plan.   Findings and plan of care discussed with supervising physician Dr. Sherry Ruffing- in agreement with plan.  Vitals:   03/20/18 1415 03/20/18 1430  BP: 121/78 112/72  Pulse: 77 77  Resp: 15 16  Temp:    SpO2: 97% 96%     Final Clinical Impressions(s) / ED Diagnoses   Final diagnoses:  Chest pain, unspecified type    ED Discharge Orders    None       Amaryllis Dyke, PA-C 03/20/18 1438    Tegeler, Gwenyth Allegra,  MD 03/20/18 [phone]

## 2018-03-20 NOTE — ED Triage Notes (Signed)
Pt was sleeping when CP woke her up. Pt reports sharp CP that is intermitted and sharp . Pt also reports a HA started when she called 911. EMS gave 324 of ASA  For CP.

## 2018-03-20 NOTE — ED Notes (Signed)
Patient transported to X-ray 

## 2018-03-20 NOTE — Progress Notes (Signed)
VASCULAR LAB PRELIMINARY  PRELIMINARY  PRELIMINARY  PRELIMINARY  Left lower extremity venous duplex completed.    Preliminary report:  There is no DVT or SVT noted in the left lower extremity.   Called results to [facility], PA-C  Aydrien Froman, [place], RVT 03/20/2018, 12:45 PM

## 2018-06-12 ENCOUNTER — Emergency Department (HOSPITAL_COMMUNITY): Payer: Medicare Other

## 2018-06-12 ENCOUNTER — Emergency Department (HOSPITAL_COMMUNITY)
Admission: EM | Admit: 2018-06-12 | Discharge: 2018-06-12 | Disposition: A | Discharge status: Home / Self Care | Payer: Medicare Other | Attending: Emergency Medicine | Admitting: Emergency Medicine

## 2018-06-12 ENCOUNTER — Other Ambulatory Visit: Payer: Self-pay

## 2018-06-12 ENCOUNTER — Encounter (HOSPITAL_COMMUNITY): Payer: Self-pay

## 2018-06-12 DIAGNOSIS — R05 Cough: Secondary | ICD-10-CM | POA: Diagnosis present

## 2018-06-12 DIAGNOSIS — R059 Cough, unspecified: Secondary | ICD-10-CM

## 2018-06-12 LAB — COMPREHENSIVE METABOLIC PANEL
ALK PHOS: 157 U/L — AB (ref 38–126)
ALT: 25 U/L (ref 0–44)
AST: 22 U/L (ref 15–41)
Albumin: 3.6 g/dL (ref 3.5–5.0)
Anion gap: 7 (ref 5–15)
BILIRUBIN TOTAL: 0.1 mg/dL — AB (ref 0.3–1.2)
BUN: 13 mg/dL (ref 8–23)
CALCIUM: 8.9 mg/dL (ref 8.9–10.3)
CHLORIDE: 108 mmol/L (ref 98–111)
CO2: 26 mmol/L (ref 22–32)
CREATININE: 0.67 mg/dL (ref 0.44–1.00)
GFR calc Af Amer: >60 mL/min (ref >60)
Glucose, Bld: 95 mg/dL (ref 70–99)
Potassium: 3.7 mmol/L (ref 3.5–5.1)
Sodium: 141 mmol/L (ref 135–145)
TOTAL PROTEIN: 6.8 g/dL (ref 6.5–8.1)

## 2018-06-12 LAB — CBC
HCT: 40.3 % (ref 36.0–46.0)
Hemoglobin: 12 g/dL (ref 12.0–15.0)
MCH: 25.2 pg — ABNORMAL LOW (ref 26.0–34.0)
MCHC: 29.8 g/dL — AB (ref 30.0–36.0)
MCV: 84.7 fL (ref 80.0–100.0)
PLATELETS: 309 10*3/uL (ref 150–400)
RBC: 4.76 MIL/uL (ref 3.87–5.11)
RDW: 15 % (ref 11.5–15.5)
WBC: 5.9 10*3/uL (ref 4.0–10.5)
nRBC: 0 % (ref 0.0–0.2)

## 2018-06-12 LAB — URINALYSIS, ROUTINE W REFLEX MICROSCOPIC
Bacteria, UA: NONE SEEN
Bilirubin Urine: NEGATIVE
Glucose, UA: NEGATIVE mg/dL
HGB URINE DIPSTICK: NEGATIVE
Ketones, ur: NEGATIVE mg/dL
Nitrite: NEGATIVE
PH: 6 (ref 5.0–8.0)
Protein, ur: NEGATIVE mg/dL
SPECIFIC GRAVITY, URINE: 1.017 (ref 1.005–1.030)

## 2018-06-12 IMAGING — CR DG CHEST 2V
2 series · 2 of 2 positions shown · non-contrast
Comparison: [DATE]

CLINICAL DATA: Nonproductive cough.

EXAM:
CHEST - 2 VIEW

[w chest pa]
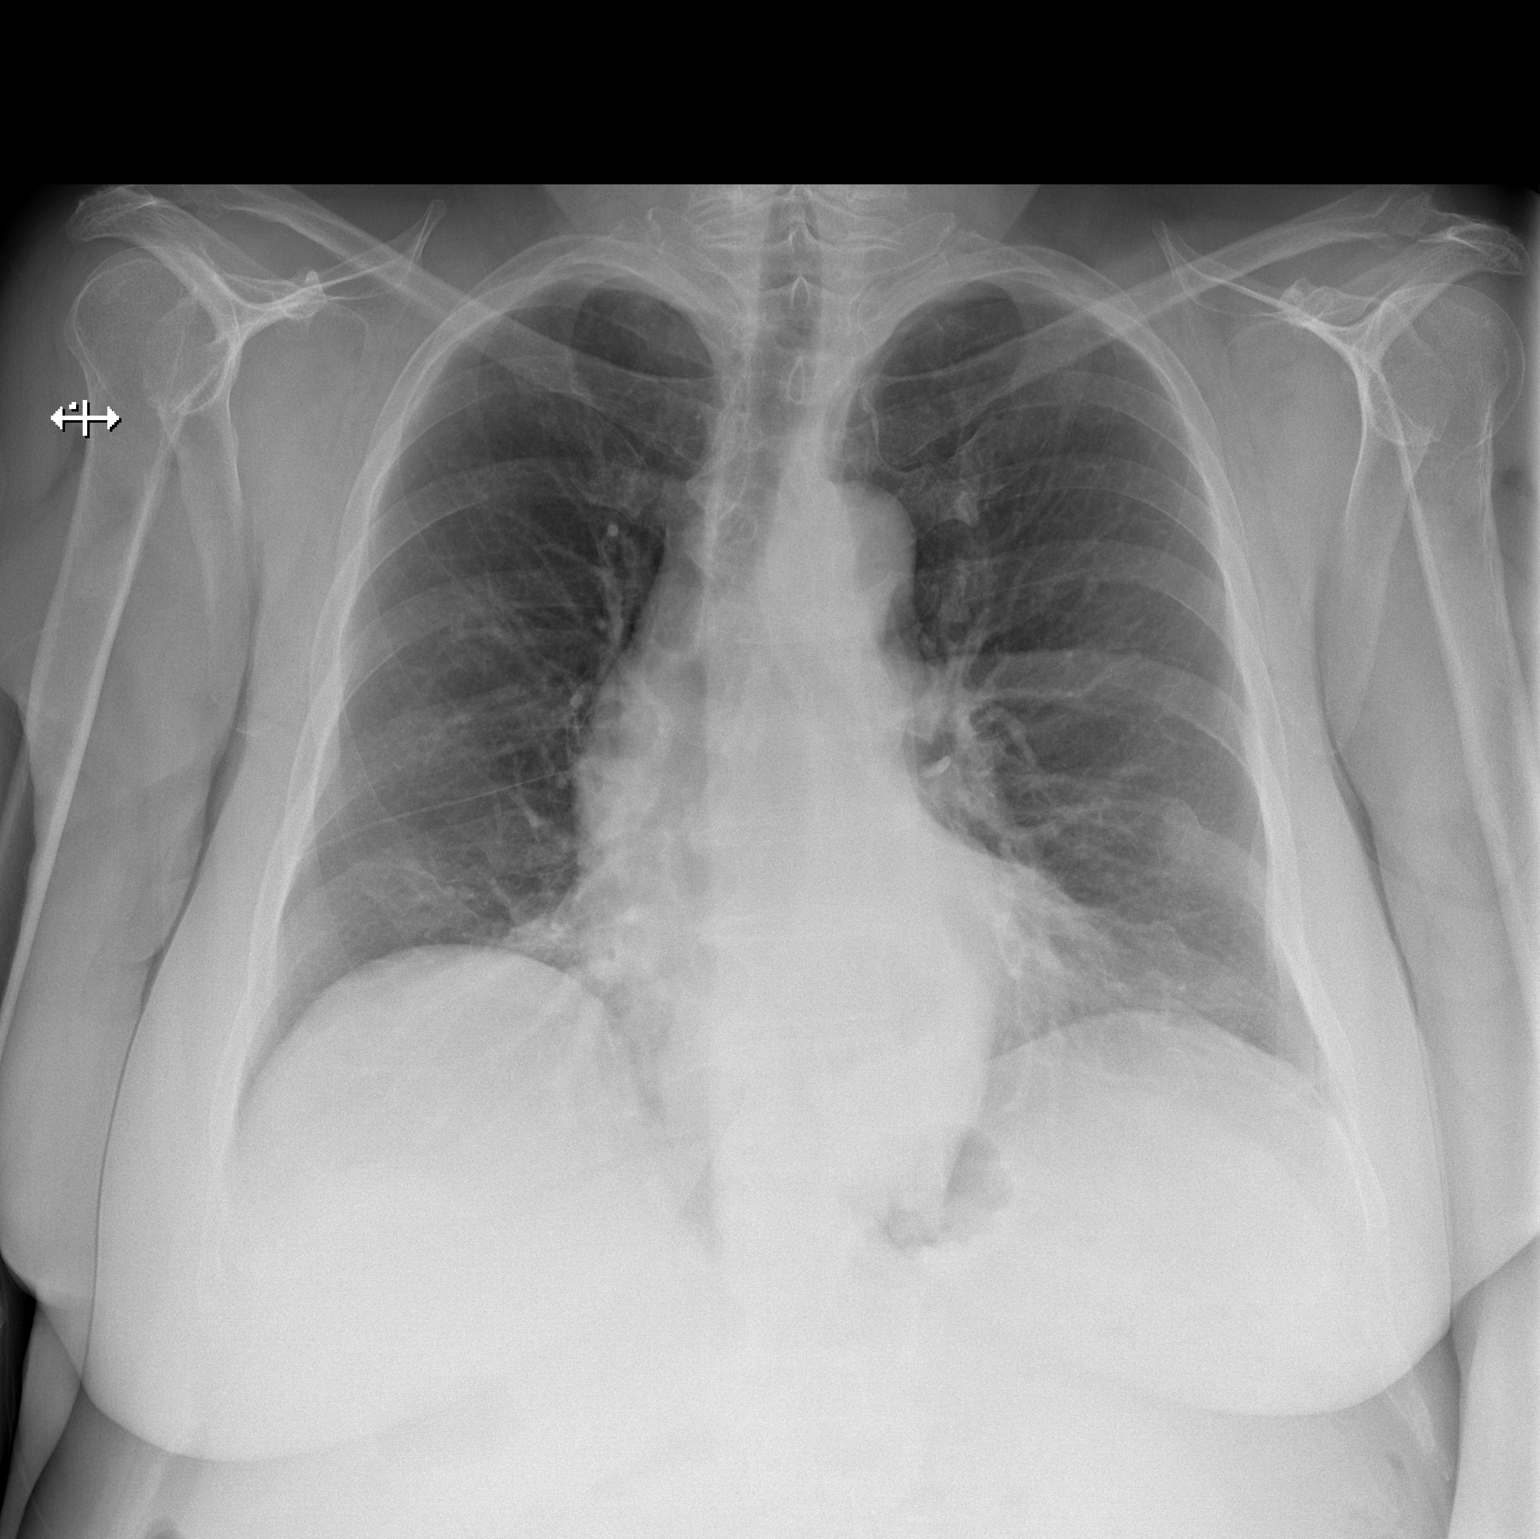

[w chest lat]
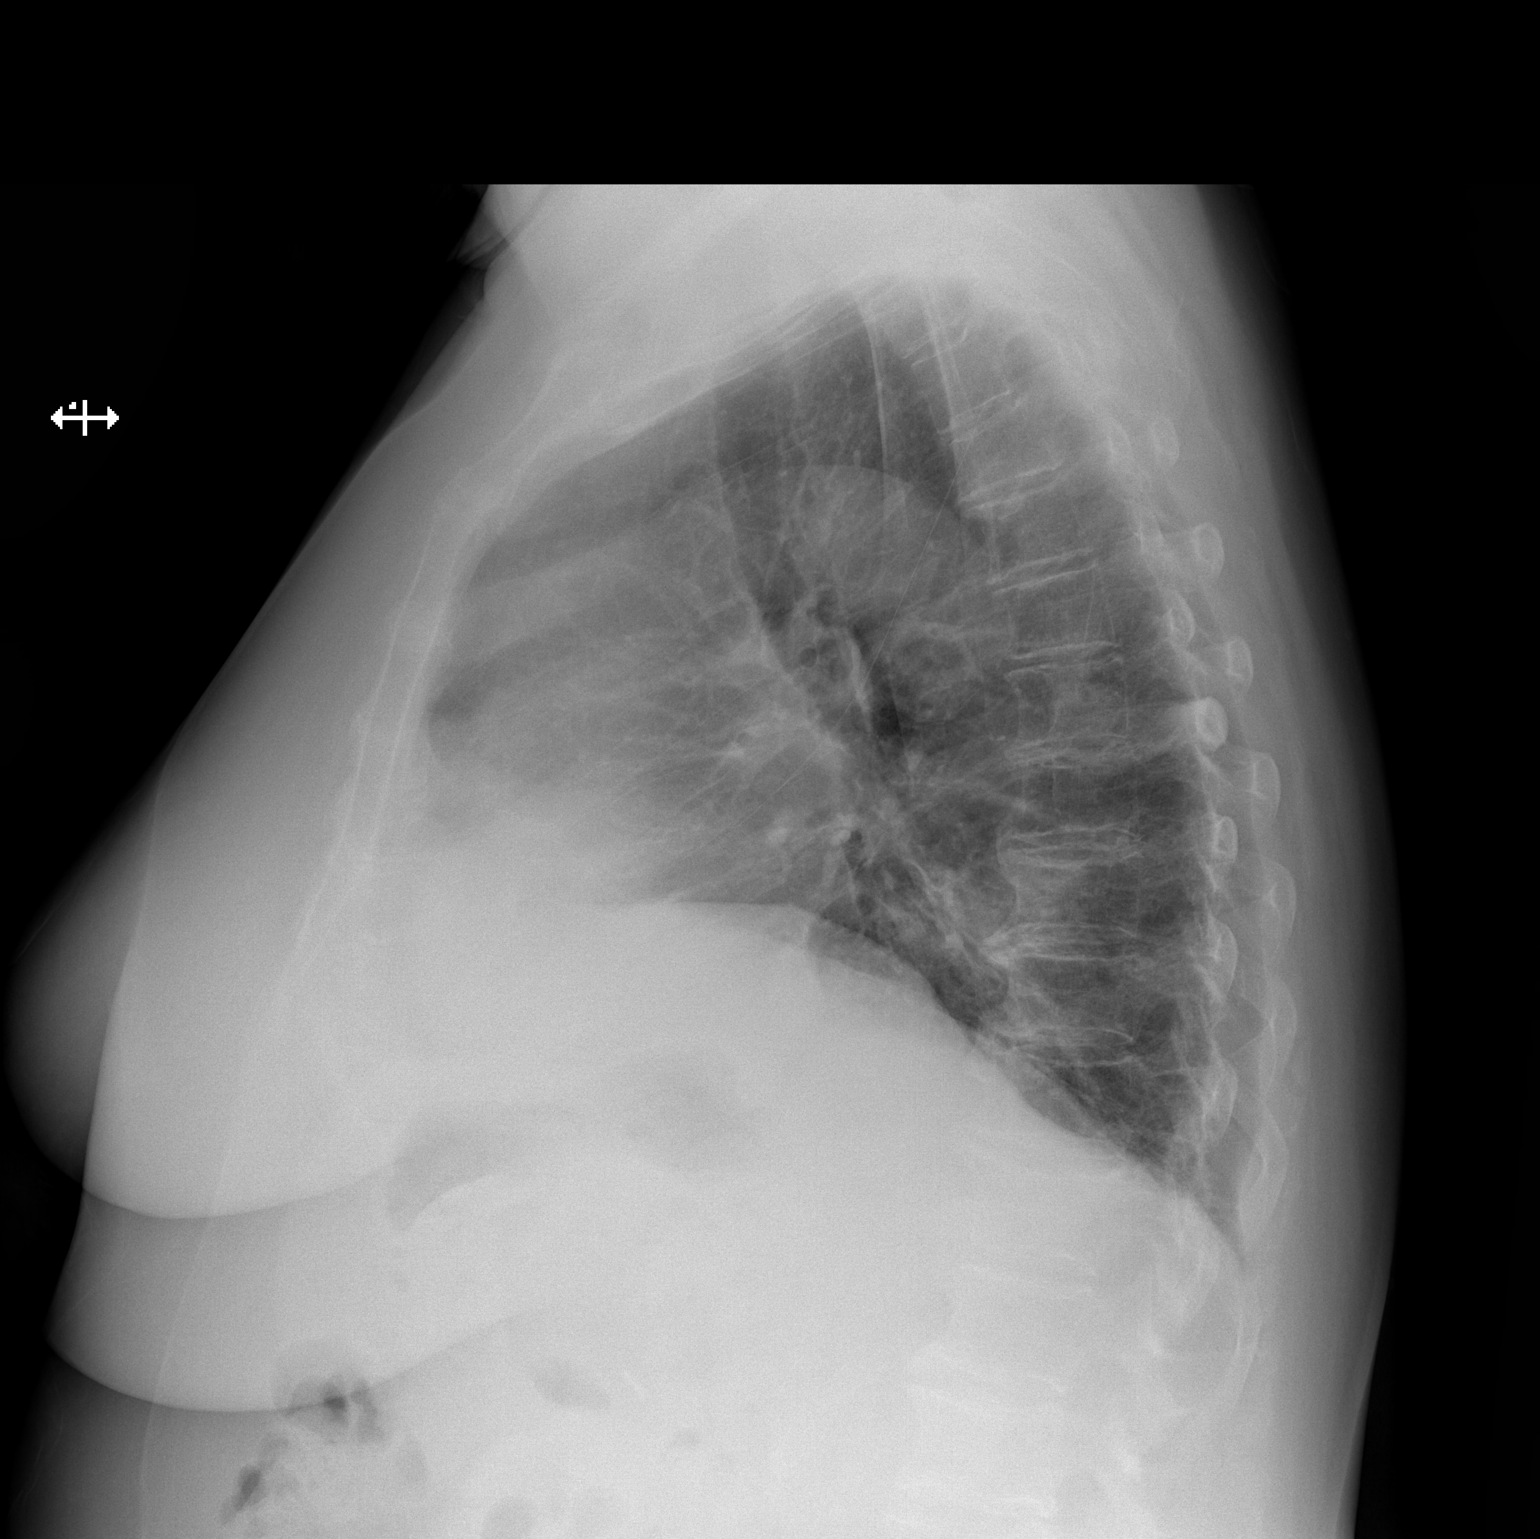

[2 of 2 positions shown; findings below may reference images not displayed]

FINDINGS: The heart size and mediastinal contours are within normal limits.
Both lungs are clear. The visualized skeletal structures are
unremarkable.
IMPRESSION: No active cardiopulmonary disease.

## 2018-06-12 NOTE — ED Notes (Signed)
Pt came out to nursing station stating that she has to go bc her ride is here. Pt signed AMA. Pt encouraged to stay. Pt ambulated out of the ED with out difficulty.

## 2018-06-12 NOTE — ED Triage Notes (Signed)
Pt arrives via EMS from the street. Pt reports cough since June worsening over the last 3 days. Pt reports back pain associated with the cough. Pt reports diarrhea beginning last night.

## 2018-06-12 NOTE — ED Triage Notes (Signed)
Upon triage the pt tells this nurse that she is suicidal and attempted suicide last week by "taking a bunch of PM pills"

## 2018-06-12 NOTE — ED Provider Notes (Signed)
[facility] DEPT Provider Note   CSN: [id number] Arrival date & time: 06/12/18  1210     History   Chief Complaint Chief Complaint  Patient presents with  . Cough  . Back Pain  . Diarrhea  . Suicidal    HPI Tracie Atkins is a 62 y.o. female.  62 year old female who complains of chronic cough times several months.  Is been nonproductive and not associated with fever or chills.  Denies any hemoptysis or weight loss.  Has another complaint of having dysuria with some left-sided back pain.  Denies any hematuria.  Has had diarrhea for about a week as well 2.  Patient states that she has had some vague suicidal ideations but denies a current plan.  When asked if she would hurt herself if she left here she said no.  Denies responding to internal stimuli.  Denies any active hallucinations.     Past Medical History:  Diagnosis Date  . Chronic dental infection 8/189   multiple teeth removed and Post -op infection.  . Depression   . ETOH abuse     Patient Active Problem List   Diagnosis Date Noted  . Suicidal ideations   . Alcohol abuse with alcohol-induced mood disorder ([place]) 11/20/2016    Past Surgical History:  Procedure Laterality Date  . DENTAL SURGERY    . TUBAL LIGATION       OB History    Gravida  3   Para  3   Term  3   Preterm      AB      Living  3     SAB      TAB      Ectopic      Multiple      Live Births               Home Medications    Prior to Admission medications   Medication Sig Start Date End Date Taking? Authorizing Provider  amoxicillin-clavulanate (AUGMENTIN) 875-125 MG tablet Take 1 tablet by mouth 2 (two) times daily. 01/18/18   Rankin, Mercy Moore, NP    Family History History reviewed. No pertinent family history.  Social History Social History   Tobacco Use  . Smoking status: Never Smoker  . Smokeless tobacco: Never Used  Substance Use Topics  . Alcohol use: Not Currently   Alcohol/week: 2.0 standard drinks    Types: 2 Cans of beer per week  . Drug use: No     Allergies   Patient has no known allergies.   Review of Systems Review of Systems  All other systems reviewed and are negative.    Physical Exam Updated Vital Signs BP (!) 157/88   Temp 98.8 F (37.1 C)   Resp 18   Wt 81.6 kg   SpO2 97%   BMI 30.90 kg/m   Physical Exam  Constitutional: She is oriented to person, place, and time. She appears well-developed and well-nourished.  Non-toxic appearance. No distress.  HENT:  Head: Normocephalic and atraumatic.  Eyes: Pupils are equal, round, and reactive to light. Conjunctivae, EOM and lids are normal.  Neck: Normal range of motion. Neck supple. No tracheal deviation present. No thyroid mass present.  Cardiovascular: Normal rate, regular rhythm and normal heart sounds. Exam reveals no gallop.  No murmur heard. Pulmonary/Chest: Effort normal and breath sounds normal. No stridor. No respiratory distress. She has no decreased breath sounds. She has no wheezes. She has no rhonchi. She has no  rales.  Abdominal: Soft. Normal appearance and bowel sounds are normal. She exhibits no distension. There is no tenderness. There is no rebound and no CVA tenderness.  Musculoskeletal: Normal range of motion. She exhibits no edema or tenderness.  Neurological: She is alert and oriented to person, place, and time. She has normal strength. No cranial nerve deficit or sensory deficit. GCS eye subscore is 4. GCS verbal subscore is 5. GCS motor subscore is 6.  Skin: Skin is warm and dry. No abrasion and no rash noted.  Psychiatric: Her speech is normal and behavior is normal. Her affect is blunt. She expresses no homicidal and no suicidal ideation. She expresses no suicidal plans and no homicidal plans.  Nursing note and vitals reviewed.    ED Treatments / Results  Labs (all labs ordered are listed, but only abnormal results are displayed) Labs Reviewed    COMPREHENSIVE METABOLIC PANEL  CBC  URINALYSIS, ROUTINE W REFLEX MICROSCOPIC    EKG None  Radiology Dg Chest 2 View  Result Date: 06/12/2018 CLINICAL DATA:  Nonproductive cough. EXAM: CHEST - 2 VIEW COMPARISON:  03/20/2018 FINDINGS: The heart size and mediastinal contours are within normal limits. Both lungs are clear. The visualized skeletal structures are unremarkable. IMPRESSION: No active cardiopulmonary disease. Electronically Signed   By: Kathreen Devoid   On: 06/12/2018 13:13    Procedures Procedures (including critical care time)  Medications Ordered in ED Medications - No data to display   Initial Impression / Assessment and Plan / ED Course  I have reviewed the triage vital signs and the nursing notes.  Pertinent labs & imaging results that were available during my care of the patient were reviewed by me and considered in my medical decision making (see chart for details).     Patient did admit to me that last week she became depressed and took some pills to hurt herself but currently denies any suicidal ideations.  States that she realized that when she did last week was wrong and is adamant that she does not want to take her life at this time.  Pt eloped prior to completed eval  Final Clinical Impressions(s) / ED Diagnoses   Final diagnoses:  Cough    ED Discharge Orders    None       Lacretia Leigh, MD 06/14/18 [phone]

## 2018-07-14 DIAGNOSIS — R6889 Other general symptoms and signs: Secondary | ICD-10-CM | POA: Diagnosis not present

## 2018-07-22 DIAGNOSIS — R6889 Other general symptoms and signs: Secondary | ICD-10-CM | POA: Diagnosis not present

## 2018-08-04 DIAGNOSIS — R6889 Other general symptoms and signs: Secondary | ICD-10-CM | POA: Diagnosis not present

## 2021-07-07 DIAGNOSIS — C50919 Malignant neoplasm of unspecified site of unspecified female breast: Secondary | ICD-10-CM

## 2021-07-07 HISTORY — DX: Malignant neoplasm of unspecified site of unspecified female breast: C50.919

## 2021-07-23 ENCOUNTER — Ambulatory Visit (HOSPITAL_COMMUNITY)
Admission: EM | Admit: 2021-07-23 | Discharge: 2021-07-23 | Disposition: A | Discharge status: Home / Self Care | Payer: Medicare PPO | Attending: Psychiatry | Admitting: Psychiatry

## 2021-07-23 DIAGNOSIS — F330 Major depressive disorder, recurrent, mild: Secondary | ICD-10-CM | POA: Diagnosis present

## 2021-07-23 DIAGNOSIS — F1010 Alcohol abuse, uncomplicated: Secondary | ICD-10-CM | POA: Diagnosis not present

## 2021-07-23 DIAGNOSIS — Z79899 Other long term (current) drug therapy: Secondary | ICD-10-CM | POA: Diagnosis not present

## 2021-07-23 DIAGNOSIS — F419 Anxiety disorder, unspecified: Secondary | ICD-10-CM | POA: Insufficient documentation

## 2021-07-23 MED ORDER — TRAZODONE HCL 100 MG PO TABS: 100.0000 mg | ORAL_TABLET | Freq: Every evening | ORAL | 0 refills | Status: DC | PRN

## 2021-07-23 MED ORDER — TOPIRAMATE 50 MG PO TABS: 50.0000 mg | ORAL_TABLET | Freq: Two times a day (BID) | ORAL | 0 refills | Status: DC

## 2021-07-23 MED ORDER — DULOXETINE HCL 30 MG PO CPEP: 30.0000 mg | ORAL_CAPSULE | Freq: Every day | ORAL | 0 refills | Status: DC

## 2021-07-23 MED ORDER — ESCITALOPRAM OXALATE 20 MG PO TABS: 20.0000 mg | ORAL_TABLET | Freq: Every day | ORAL | 0 refills | Status: DC

## 2021-07-23 NOTE — Discharge Instructions (Addendum)
Patient is instructed prior to discharge to: Take all medications as prescribed by his/her mental healthcare provider. Report any adverse effects and or reactions from the medicines to his/her outpatient provider promptly. Patient has been instructed & cautioned: To not engage in alcohol and or illegal drug use while on prescription medicines. In the event of worsening symptoms, patient is instructed to call the crisis hotline, 911 and or go to the nearest ED for appropriate evaluation and treatment of symptoms.   Please contact one of the following facilities to start medication management and therapy services:   [facility] [address]  [place], [place] [address] [phone]   [facility]  [address] [place], [place] [address] [phone]  [facility]  [address] [place], [place] [address] [phone]  [facility]  [address] [place], [place] [address] [phone]  [facility]  [place], [place] [address] [phone]  [facility]  [address],  [place], [place] [address] [phone]  To follow-up with his/her primary care provider for your other medical issues, concerns and or health care needs.

## 2021-07-23 NOTE — Progress Notes (Signed)
°   07/23/21 1012  [place] (Walk-ins at [facility] only)  How Did You Hear About [place]? Family/Friend  What Is the Reason for Your Visit/Call Today? Patient presents with daughter in law for assessment, requesting to have medications refilled.  She just left rehab in [place] on 12/8 and has been clean and sober from alcohol and cocaine since.  She moved here from [place] to live with her son, mostly to be away from negative contacts.  She reports she is adjusting fairly well. She is  experiencing worsening derpession since being off medications for the past week.  Patient denies SI, HI and AVH.  Patient has struggled to find a provider here, and was even willing to take a bus back to [place] if she needed to.  She is hopeful to speak with a provider about getting temporary Rxs for meds and to be linked to a provider in the area. She would also like to start seeing a counselor.  How Long Has This Been Causing You Problems? 1 wk - 1 month  Have You Recently Had Any Thoughts About Hurting Yourself? No  Are You Planning to Commit Suicide/Harm Yourself At This time? No  Have you Recently Had Thoughts About [place]? No  Are You Planning To Harm Someone At This Time? No  Are you currently experiencing any auditory, visual or other hallucinations? No  Have You Used Any Alcohol or Drugs in the Past 24 Hours? No  Do you have any current medical co-morbidities that require immediate attention? No  Clinician description of patient physical appearance/behavior: Calm, cooperative,pleasant AAOx5  What Do You Feel Would Help You the Most Today? Treatment for Depression or other mood problem  If access to [facility] was not available, would you have sought care in the Emergency Department? No  Determination of Need Routine (7 days)  Options For Referral Medication Management;Outpatient Therapy

## 2021-07-23 NOTE — ED Provider Notes (Addendum)
Behavioral Health Urgent Care Medical Screening Exam  Patient Name: Tracie Atkins MRN: [id number] Date of Evaluation: 07/23/21 Chief Complaint:   Diagnosis:  Final diagnoses:  MDD (major depressive disorder), recurrent episode, mild ([place])    History of Present illness: Tracie Atkins is a 66 y.o. female patient presented to [facility] as a walk in  accompanied by her daughter in law with complaints of "I need my medications refilled".  Tracie Atkins, 66 y.o., female patient seen face to face by this provider, consulted with Dr. Serafina Mitchell; and chart reviewed on 07/23/21.  On evaluation Tracie Atkins reports she has been living in [place] for the past few years.  She has recently returned to [place].  She is living with her son and daughter-in-law.  She has a history of MDD, anxiety, and alcohol abuse.  She is currently prescribed duloxetine DR 30 mg nightly trazodone 100 mg nightly as needed daily, Topiramate 50 mg twice daily, Topiramate 25 mg sprinkles daily, and Lexapro 20 mg daily.  She reports 3 inpatient psychiatric admissions due to SI and alcohol abuse.  She was in residential treatment for crack cocaine and alcohol abuse, she has been sober/not using since 05/2021.  Reports her medications have ran out and she has not had them in a few days.  She considered taking a train back to [place] to have them filled but she does not have enough money.  Her provider cannot call the medications into [place].  She is requesting that her medications be refilled.  She is requesting resources for psychiatric providers.  During evaluation Tracie Atkins is in sitting position in no acute distress.  She is fairly groomed, and makes good eye contact.  Her speech is clear, coherent, normal rate and tone.  She denies depression and anxiety at this time states "I actually feel really stable".  Reports 7-8 hours of sleep per night and denies any concerns with appetite.  There is no indication that she is  currently responding to internal/external stimuli.  She denies AVH.  She denies suicidal/self-harm/homicidal ideations.  She contracts for safety.  Patient remained calm through the assessment and answer questions appropriately.  Patient was instructed to have her out-of-state Medicaid switched to [place].  A one month prescription was sent to patient's pharmacy for each of the following medications: Duloxetine DR 30 mg nightly, trazodone 100 mg nightly as needed, topiramate 50 mg twice daily, and escitalopram 20 mg daily. Consulted with Dr. Serafina Mitchell, Topiramate 25 mg sprinkles will not be refilled. Outpatient psychiatric resources were provided.  Educated patient on serotonin syndrome including signs and symptoms.  At this time Tracie Atkins is educated and verbalizes understanding of mental health resources and other crisis services in the community.  She is instructed to call 911 and present to the nearest emergency room should she experience any suicidal/homicidal ideation, auditory/visual/hallucinations, or detrimental worsening of her mental health condition.  She was a also advised by Probation officer that she could call the toll-free phone on insurance card to assist with identifying in network counselors and agencies or number on back of Medicaid card to speak with care coordinator.    Psychiatric Specialty Exam  Presentation  General Appearance:Appropriate for Environment; Casual  Eye Contact:Good  Speech:Clear and Coherent; Normal Rate  Speech Volume:Normal  Handedness:Right   Mood and Affect  Mood:Euthymic  Affect:Appropriate; Congruent   Thought Process  Thought Processes:Coherent  Descriptions of Associations:Intact  Orientation:Full (Time, Place and Person)  Thought Content:Logical  Hallucinations:None  Ideas of Reference:None  Suicidal Thoughts:No  Homicidal Thoughts:No   Sensorium  Memory:Immediate Good; Recent Good; Remote  Good  Judgment:Good  Insight:Good   Executive Functions  Concentration:Good  Attention Span:Good  [place]  Language:Good   Psychomotor Activity  Psychomotor Activity:Normal   Assets  Assets:Communication Skills; Desire for Improvement; Financial Resources/Insurance; Housing; Physical Health; Transportation   Sleep  Sleep:Good  Number of hours: 7   No data recorded  Physical Exam: Physical Exam Vitals and nursing note reviewed.  Constitutional:      General: She is not in acute distress.    Appearance: Normal appearance. She is well-developed.  HENT:     Head: Normocephalic and atraumatic.  Eyes:     General:        Right eye: No discharge.        Left eye: No discharge.     Conjunctiva/sclera: Conjunctivae normal.  Cardiovascular:     Rate and Rhythm: Normal rate.     Heart sounds: No murmur heard. Pulmonary:     Effort: Pulmonary effort is normal.  Musculoskeletal:        General: No swelling. Normal range of motion.     Cervical back: Normal range of motion.  Skin:    Coloration: Skin is not jaundiced or pale.  Neurological:     Mental Status: She is alert and oriented to person, place, and time.  Psychiatric:        Attention and Perception: Attention and perception normal.        Mood and Affect: Mood and affect normal.        Speech: Speech normal.        Behavior: Behavior normal. Behavior is cooperative.        Thought Content: Thought content normal.        Cognition and Memory: Cognition normal.        Judgment: Judgment normal.   Review of Systems  Constitutional: Negative.   HENT: Negative.    Eyes: Negative.   Respiratory: Negative.    Cardiovascular: Negative.   Gastrointestinal: Negative.   Musculoskeletal: Negative.   Skin: Negative.   Neurological: Negative.   Psychiatric/Behavioral: Negative.    Blood pressure (!) 143/81, pulse 70, temperature 98 F (36.7 C), temperature source Oral, resp.  rate 16, height 5\' 4"  (1.626 m), weight 180 lb (81.6 kg), SpO2 98 %. Body mass index is 30.9 kg/m.  Musculoskeletal: Strength & Muscle Tone: within normal limits Gait & Station: normal Patient leans: N/A   [place] MSE Discharge Disposition for Follow up and Recommendations: Based on my evaluation the patient does not appear to have an emergency medical condition and can be discharged with resources and follow up care in outpatient services for Medication Management  Discharge patient.  One month prescription was sent to patient's pharmacy for the following medications: Duloxetine DR 30 mg nightly, trazodone 100 mg nightly as needed, topiramate 50 mg twice daily, and escitalopram 20 mg daily.  Outpatient psychiatric resources were provided.  Educated patient on serotonin syndrome including signs and symptoms.  Provided outpatient psychiatric resources, they are located on the AVS.  Instructed patient that when she switched her out-of-state Medicaid to [facility] she would be eligible for outpatient psychiatric services on the second floor.  No evidence of imminent risk to self or others at present.    Patient does not meet criteria for psychiatric inpatient admission. Discussed crisis plan, support from social network, calling 911, coming  to the Emergency Department, and calling Suicide Hotline.   Revonda Humphrey, NP 07/23/2021, 2:27 PM

## 2021-08-06 ENCOUNTER — Telehealth (HOSPITAL_COMMUNITY): Payer: Self-pay | Admitting: Emergency Medicine

## 2021-08-06 NOTE — BH Assessment (Signed)
Care Management - [place] Follow Up Discharges   Writer attempted to make contact with patient today and was unsuccessful.  Phone number is not in service.    Per chart review, patient was provided with outpatient resources.

## 2021-08-18 ENCOUNTER — Emergency Department (HOSPITAL_COMMUNITY)
Admission: EM | Admit: 2021-08-18 | Discharge: 2021-08-19 | Disposition: A | Discharge status: Other Acute Inpatient Hospital | Payer: Medicare Other | Attending: Emergency Medicine | Admitting: Emergency Medicine

## 2021-08-18 ENCOUNTER — Emergency Department (HOSPITAL_COMMUNITY): Payer: Medicare Other

## 2021-08-18 ENCOUNTER — Encounter (HOSPITAL_COMMUNITY): Payer: Self-pay

## 2021-08-18 ENCOUNTER — Other Ambulatory Visit: Payer: Self-pay

## 2021-08-18 DIAGNOSIS — S0003XA Contusion of scalp, initial encounter: Secondary | ICD-10-CM | POA: Diagnosis not present

## 2021-08-18 DIAGNOSIS — R9431 Abnormal electrocardiogram [ECG] [EKG]: Secondary | ICD-10-CM | POA: Diagnosis not present

## 2021-08-18 DIAGNOSIS — J939 Pneumothorax, unspecified: Secondary | ICD-10-CM | POA: Diagnosis not present

## 2021-08-18 DIAGNOSIS — S199XXA Unspecified injury of neck, initial encounter: Secondary | ICD-10-CM | POA: Diagnosis not present

## 2021-08-18 DIAGNOSIS — W108XXA Fall (on) (from) other stairs and steps, initial encounter: Secondary | ICD-10-CM | POA: Insufficient documentation

## 2021-08-18 DIAGNOSIS — Z20822 Contact with and (suspected) exposure to covid-19: Secondary | ICD-10-CM | POA: Diagnosis not present

## 2021-08-18 DIAGNOSIS — S0990XA Unspecified injury of head, initial encounter: Secondary | ICD-10-CM | POA: Diagnosis not present

## 2021-08-18 DIAGNOSIS — R45851 Suicidal ideations: Secondary | ICD-10-CM | POA: Insufficient documentation

## 2021-08-18 DIAGNOSIS — Z79899 Other long term (current) drug therapy: Secondary | ICD-10-CM | POA: Insufficient documentation

## 2021-08-18 DIAGNOSIS — M47812 Spondylosis without myelopathy or radiculopathy, cervical region: Secondary | ICD-10-CM | POA: Diagnosis not present

## 2021-08-18 DIAGNOSIS — G4489 Other headache syndrome: Secondary | ICD-10-CM | POA: Diagnosis not present

## 2021-08-18 DIAGNOSIS — F10129 Alcohol abuse with intoxication, unspecified: Secondary | ICD-10-CM | POA: Diagnosis not present

## 2021-08-18 DIAGNOSIS — I10 Essential (primary) hypertension: Secondary | ICD-10-CM | POA: Diagnosis not present

## 2021-08-18 DIAGNOSIS — Y907 Blood alcohol level of 200-239 mg/100 ml: Secondary | ICD-10-CM | POA: Diagnosis not present

## 2021-08-18 DIAGNOSIS — K449 Diaphragmatic hernia without obstruction or gangrene: Secondary | ICD-10-CM | POA: Diagnosis not present

## 2021-08-18 DIAGNOSIS — F10920 Alcohol use, unspecified with intoxication, uncomplicated: Secondary | ICD-10-CM

## 2021-08-18 DIAGNOSIS — W19XXXA Unspecified fall, initial encounter: Secondary | ICD-10-CM

## 2021-08-18 DIAGNOSIS — R0902 Hypoxemia: Secondary | ICD-10-CM | POA: Diagnosis not present

## 2021-08-18 DIAGNOSIS — R112 Nausea with vomiting, unspecified: Secondary | ICD-10-CM | POA: Diagnosis not present

## 2021-08-18 LAB — CBC WITH DIFFERENTIAL/PLATELET
Abs Immature Granulocytes: 0.06 10*3/uL (ref 0.00–0.07)
Basophils Absolute: 0 10*3/uL (ref 0.0–0.1)
Basophils Relative: 1 %
Eosinophils Absolute: 0.1 10*3/uL (ref 0.0–0.5)
Eosinophils Relative: 1 %
HCT: 39.3 % (ref 36.0–46.0)
Hemoglobin: 12.9 g/dL (ref 12.0–15.0)
Immature Granulocytes: 1 %
Lymphocytes Relative: 21 %
Lymphs Abs: 1.7 10*3/uL (ref 0.7–4.0)
MCH: 29.1 pg (ref 26.0–34.0)
MCHC: 32.8 g/dL (ref 30.0–36.0)
MCV: 88.5 fL (ref 80.0–100.0)
Monocytes Absolute: 0.7 10*3/uL (ref 0.1–1.0)
Monocytes Relative: 9 %
Neutro Abs: 5.5 10*3/uL (ref 1.7–7.7)
Neutrophils Relative %: 67 %
Platelets: 241 10*3/uL (ref 150–400)
RBC: 4.44 MIL/uL (ref 3.87–5.11)
RDW: 14.3 % (ref 11.5–15.5)
WBC: 8 10*3/uL (ref 4.0–10.5)
nRBC: 0 % (ref 0.0–0.2)

## 2021-08-18 LAB — COMPREHENSIVE METABOLIC PANEL
ALT: 15 U/L (ref 0–44)
AST: 16 U/L (ref 15–41)
Albumin: 3.3 g/dL — ABNORMAL LOW (ref 3.5–5.0)
Alkaline Phosphatase: 149 U/L — ABNORMAL HIGH (ref 38–126)
Anion gap: 8 (ref 5–15)
BUN: 12 mg/dL (ref 8–23)
CO2: 23 mmol/L (ref 22–32)
Calcium: 9.2 mg/dL (ref 8.9–10.3)
Chloride: 111 mmol/L (ref 98–111)
Creatinine, Ser: 0.75 mg/dL (ref 0.44–1.00)
GFR, Estimated: >60 mL/min (ref >60)
Glucose, Bld: 180 mg/dL — ABNORMAL HIGH (ref 70–99)
Potassium: 3.1 mmol/L — ABNORMAL LOW (ref 3.5–5.1)
Sodium: 142 mmol/L (ref 135–145)
Total Bilirubin: 0.2 mg/dL — ABNORMAL LOW (ref 0.3–1.2)
Total Protein: 6.1 g/dL — ABNORMAL LOW (ref 6.5–8.1)

## 2021-08-18 LAB — ACETAMINOPHEN LEVEL: Acetaminophen (Tylenol), Serum: <10 ug/mL — ABNORMAL LOW (ref 10–30)

## 2021-08-18 LAB — RESP PANEL BY RT-PCR (FLU A&B, COVID) ARPGX2
Influenza A by PCR: NEGATIVE
Influenza B by PCR: NEGATIVE
SARS Coronavirus 2 by RT PCR: NEGATIVE

## 2021-08-18 LAB — SALICYLATE LEVEL: Salicylate Lvl: <7 mg/dL — ABNORMAL LOW (ref 7.0–30.0)

## 2021-08-18 LAB — ETHANOL: Alcohol, Ethyl (B): 205 mg/dL — ABNORMAL HIGH (ref <10)

## 2021-08-18 IMAGING — CT CT HEAD W/O CM
3 series · 16 of 37 positions shown, 18 images · non-contrast
Comparison: None.

CLINICAL DATA: Head trauma, moderate-severe heavily intoxicated,
fall down steps, head trauma; Neck trauma (Age >= 65y) heavily
intoxicated, fall down steps



[Series 3: head without · axial · non-contrast · 0.39mm/px · z∈[-158,-38]mm · 7 of 34 slices shown, 9 images]
[im 5/34  brain]
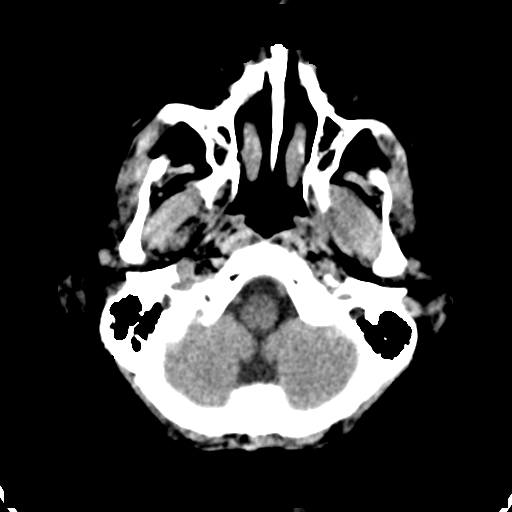
[im 5/34  bone]
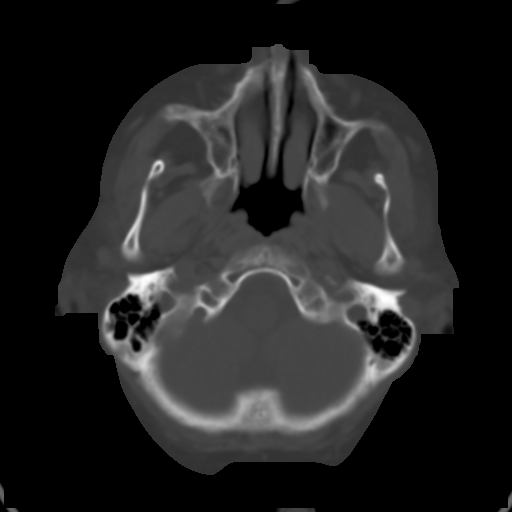
[im 9/34  brain]
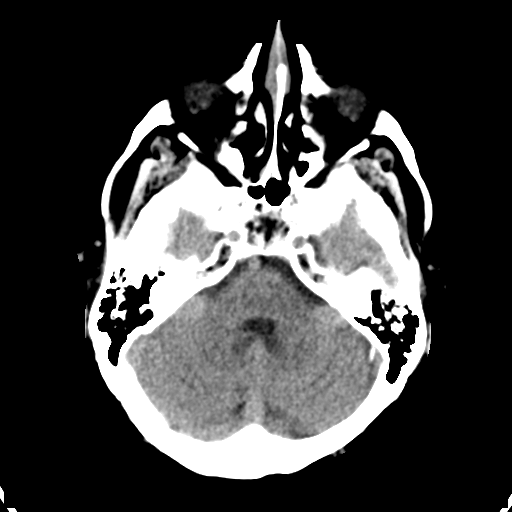
[im 13/34  brain]
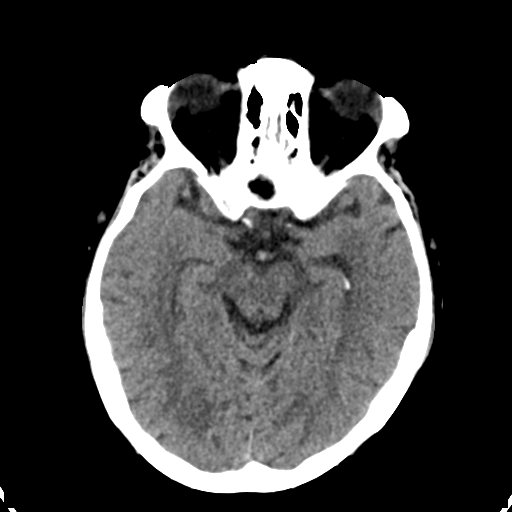
[im 17/34  brain]
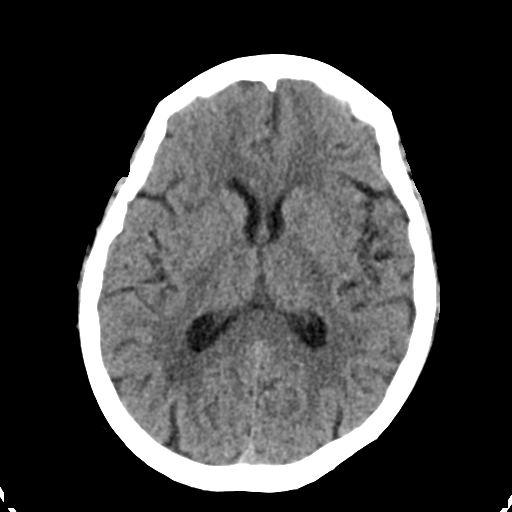
[im 21/34  brain]
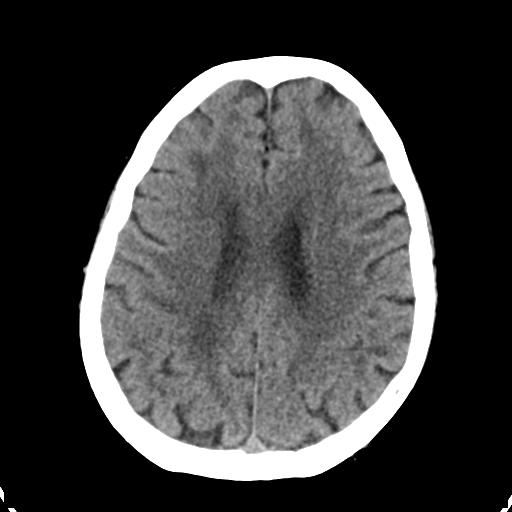
[im 21/34  bone]
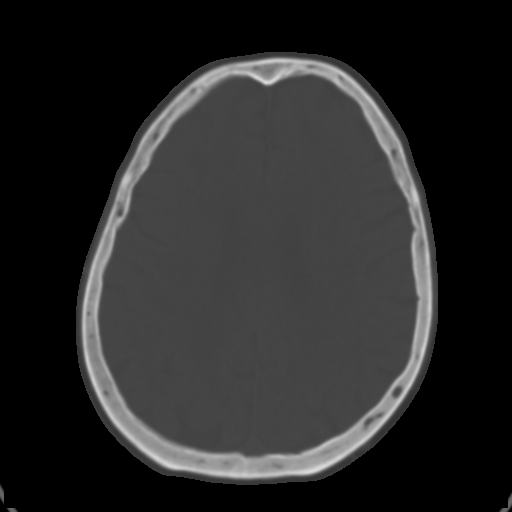
[im 25/34  brain]
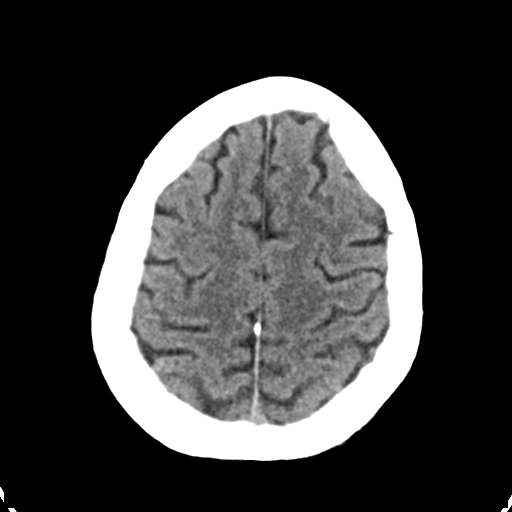
[im 29/34  brain]
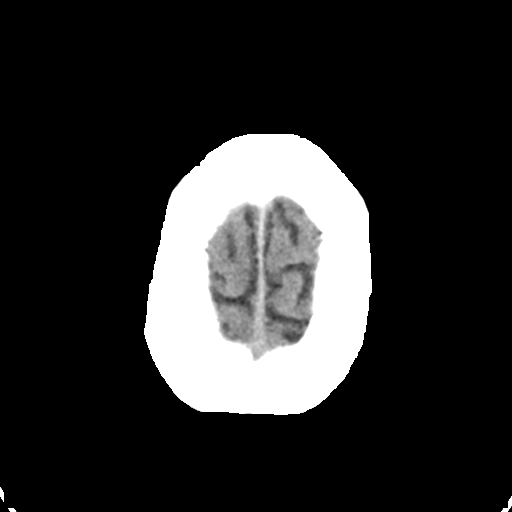

[Series 4: head bone · axial · 0.39mm/px · z∈[-162,-62]mm · 6 of 84 slices shown]
[im 9/84  bone]
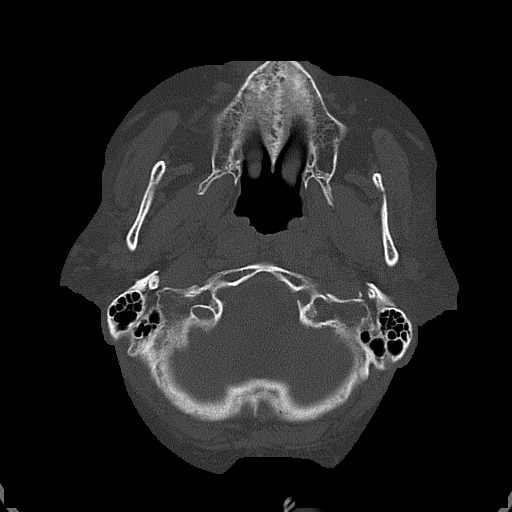
[im 17/84  bone]
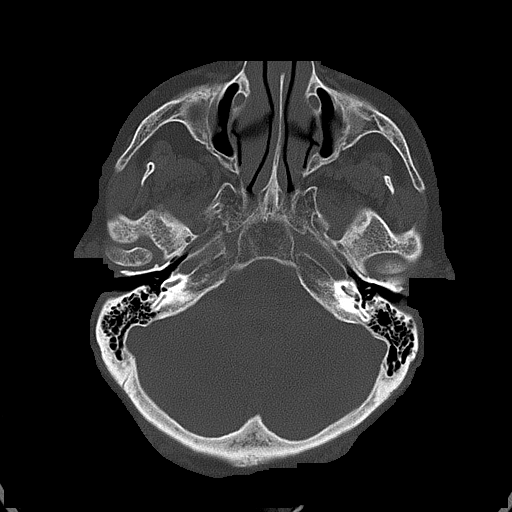
[im 25/84  bone]
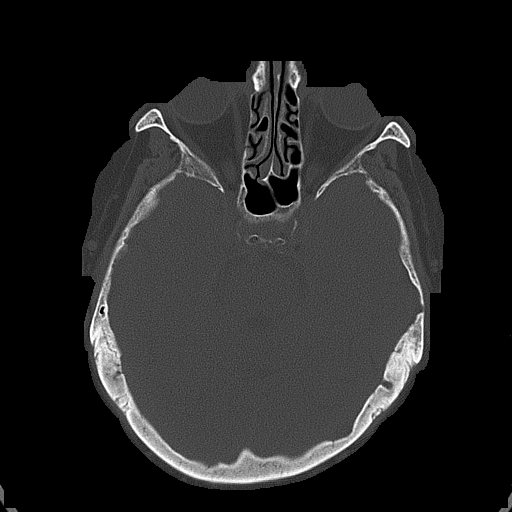
[im 38/84  bone]
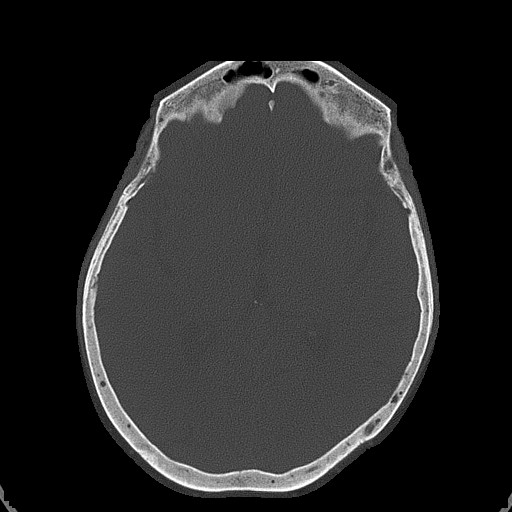
[im 46/84  bone]
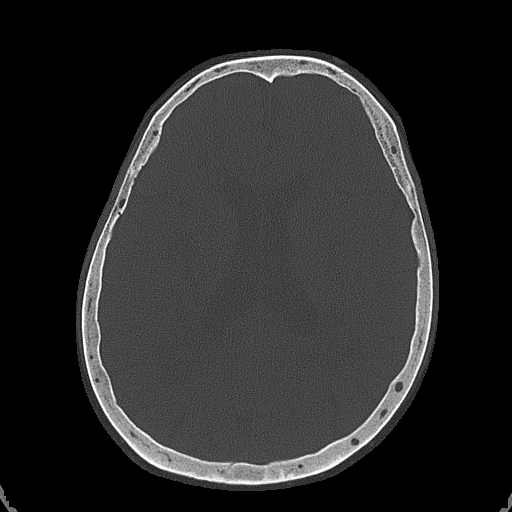
[im 59/84  bone]
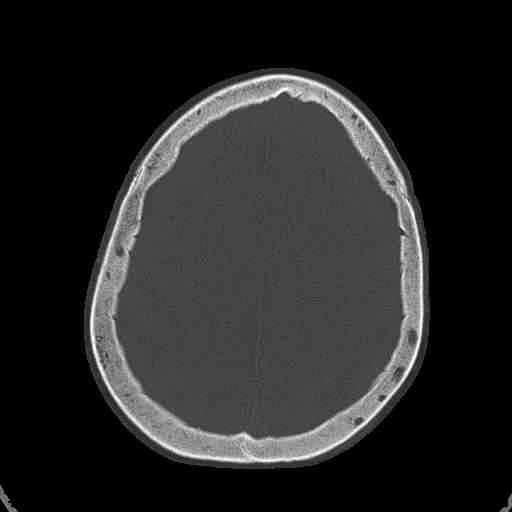

[Series 6: head without sag · sagittal · non-contrast · 0.32mm/px · 3 of 64 slices shown]
[im 22/64  brain]
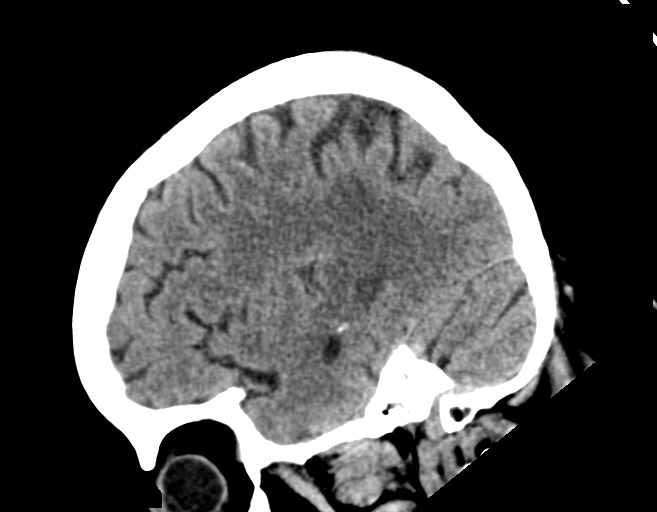
[im 32/64  brain]
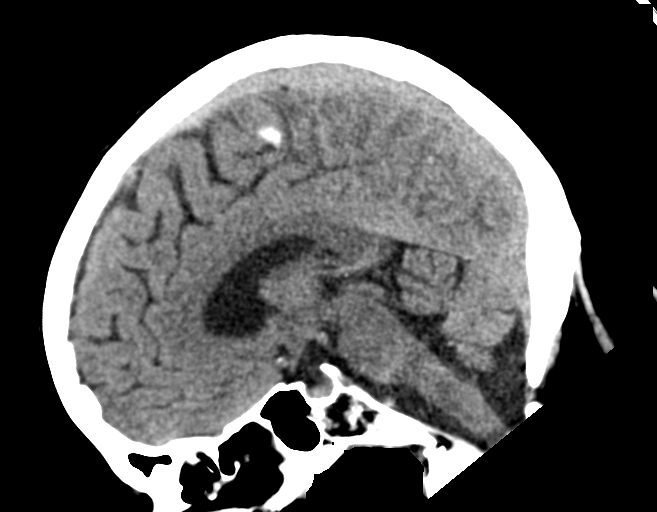
[im 43/64  brain]
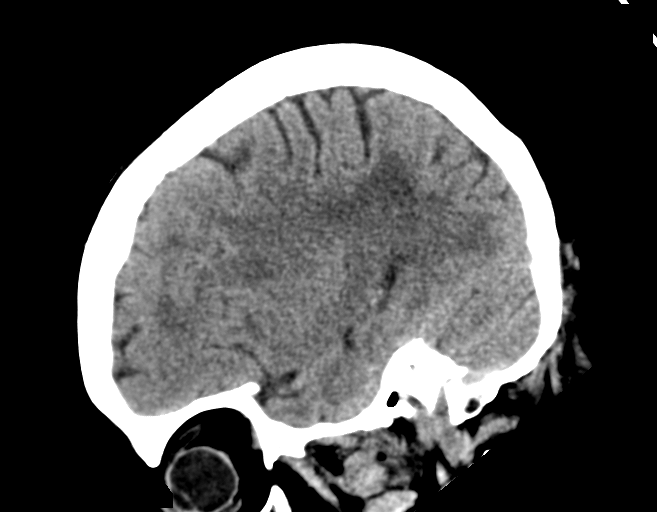

[16 of 37 positions shown; findings below may reference images not displayed]

FINDINGS: CT HEAD FINDINGS

BRAIN:
BRAIN
Patchy and confluent areas of decreased attenuation are noted
throughout the deep and periventricular white matter of the cerebral
hemispheres bilaterally, compatible with chronic microvascular
ischemic disease.

No evidence of large-territorial acute infarction. No parenchymal
hemorrhage. No mass lesion. No extra-axial collection.

No mass effect or midline shift. No hydrocephalus. Basilar cisterns
are patent.

Vascular: No hyperdense vessel.

Skull: No acute fracture or focal lesion.

Sinuses/Orbits: Paranasal sinuses and mastoid air cells are clear.
Bilateral lens replacement. Otherwise the orbits are unremarkable.

Other: None.

CT CERVICAL SPINE FINDINGS

Alignment: Straightening of the normal cervical lordosis likely due
to positioning and degenerative changes.

Skull base and vertebrae: Multilevel degenerative changes spine most
prominent at the C5-C6 level. No acute fracture. No aggressive
appearing focal osseous lesion or focal pathologic process.

Soft tissues and spinal canal: No prevertebral fluid or swelling. No
visible canal hematoma.

Upper chest: Query trace right apical foci of pleural gas that could
represents trace pneumothorax (7: 18-23).

Other: None.
IMPRESSION: 1. No acute intracranial abnormality.
2. No acute displaced fracture or traumatic listhesis of the
cervical spine.
3. Query trace right apical foci of pleural gas that could
represents trace pneumothorax.

## 2021-08-18 MED ORDER — ONDANSETRON HCL 4 MG/2ML IJ SOLN
4.0000 mg | Freq: Once | INTRAMUSCULAR | Status: AC
  Administered 2021-08-18: 4 mg via INTRAVENOUS
  Filled 2021-08-18: qty 2

## 2021-08-18 MED ORDER — SODIUM CHLORIDE 0.9 % IV BOLUS
1000.0000 mL | Freq: Once | INTRAVENOUS | Status: AC
  Administered 2021-08-18: 1000 mL via INTRAVENOUS

## 2021-08-18 NOTE — ED Notes (Signed)
Pt stating "who gives a fuck if I die, not me. I dont fucking care."

## 2021-08-18 NOTE — ED Triage Notes (Addendum)
BIB EMS s/p fall down approx. 3-4 stairs after drinking 2 bottles of wine onto concrete. Ambulatory on scene with EMS. Per pt, hit head. No blood thinners  Per EMS, pt has been sober for a while and relapsed tonight and endorsed SI.

## 2021-08-18 NOTE — ED Notes (Signed)
Pt reports having a blue bag with $200 in it. There is no bag with pt at time of EMS arrival.

## 2021-08-18 NOTE — ED Provider Notes (Signed)
[facility] EMERGENCY DEPARTMENT Provider Note   CSN: [id number] Arrival date & time: 08/18/21  2247     History  Chief Complaint  Patient presents with   Lytle Michaels    Tracie Atkins is a 66 y.o. female.   Fall   LEVEL V CAVEAT:  INTOXICATION  66 y.o. F with hx of depression, alcohol abuse, presenting to the ED after a fall.  Per EMS, patient drank 2 bottles of wine tonight and fell down a small flight (approx 4 steps) at home, landed on concrete.  Unclear if LOC.  Upon their arrival patient was in the backseat of a car in the driveway and was able to walk to EMS stretcher.  Does have head trauma to posterior scalp, no open wounds/laceration.  She is not currently on anticoagulation.  EMS applied c-collar but patient continuously removing it.  Also reported SI to EMS en route.  Home Medications Prior to Admission medications   Medication Sig Start Date End Date Taking? Authorizing Provider  DULoxetine (CYMBALTA) 30 MG capsule Take 1 capsule (30 mg total) by mouth at bedtime. 07/23/21 07/23/22  Revonda Humphrey, NP  escitalopram (LEXAPRO) 20 MG tablet Take 1 tablet (20 mg total) by mouth daily. 07/23/21 07/23/22  Revonda Humphrey, NP  topiramate (TOPAMAX) 50 MG tablet Take 1 tablet (50 mg total) by mouth 2 (two) times daily. 07/23/21 07/23/22  Revonda Humphrey, NP  traZODone (DESYREL) 100 MG tablet Take 1 tablet (100 mg total) by mouth at bedtime as needed for sleep. 07/23/21   Revonda Humphrey, NP      Allergies    Patient has no known allergies.    Review of Systems   Review of Systems  Unable to perform ROS: Other   Physical Exam Updated Vital Signs BP (!) 90/58 (BP Location: Right Arm)    Pulse 84    Temp 97.6 F (36.4 C) (Oral)    Resp 17    Ht 5\' 4"  (1.626 m)    Wt 86.2 kg    SpO2 93%    BMI 32.61 kg/m   Physical Exam Vitals and nursing note reviewed.  Constitutional:      Appearance: She is well-developed.     Comments: Intoxicated, spitting into  emesis bag during exam  HENT:     Head: Normocephalic and atraumatic.     Comments: Contusion noted to left occiput, no open wound/laceration Eyes:     Conjunctiva/sclera: Conjunctivae normal.     Pupils: Pupils are equal, round, and reactive to light.  Cardiovascular:     Rate and Rhythm: Normal rate and regular rhythm.     Heart sounds: Normal heart sounds.  Pulmonary:     Effort: Pulmonary effort is normal. No respiratory distress.     Breath sounds: Normal breath sounds. No rhonchi.  Abdominal:     General: Bowel sounds are normal.     Palpations: Abdomen is soft.     Tenderness: There is no abdominal tenderness. There is no rebound.  Musculoskeletal:        General: Normal range of motion.     Cervical back: Normal range of motion.  Skin:    General: Skin is warm and dry.  Neurological:     Mental Status: She is alert and oriented to person, place, and time.    ED Results / Procedures / Treatments   Labs (all labs ordered are listed, but only abnormal results are displayed) Labs Reviewed  COMPREHENSIVE METABOLIC  PANEL - Abnormal; Notable for the following components:      Result Value   Potassium 3.1 (*)    Glucose, Bld 180 (*)    Total Protein 6.1 (*)    Albumin 3.3 (*)    Alkaline Phosphatase 149 (*)    Total Bilirubin 0.2 (*)    All other components within normal limits  ETHANOL - Abnormal; Notable for the following components:   Alcohol, Ethyl (B) 205 (*)    All other components within normal limits  SALICYLATE LEVEL - Abnormal; Notable for the following components:   Salicylate Lvl <7.4 (*)    All other components within normal limits  ACETAMINOPHEN LEVEL - Abnormal; Notable for the following components:   Acetaminophen (Tylenol), Serum <10 (*)    All other components within normal limits  RESP PANEL BY RT-PCR (FLU A&B, COVID) ARPGX2  CBC WITH DIFFERENTIAL/PLATELET  RAPID URINE DRUG SCREEN, HOSP PERFORMED    EKG None  Radiology DG Chest 1  View  Result Date: 08/19/2021 CLINICAL DATA:  Fall. EXAM: CHEST  1 VIEW COMPARISON:  Chest radiograph dated 06/12/2018. FINDINGS: Shallow inspiration. No focal consolidation, pleural effusion, or pneumothorax. The cardiac silhouette is within limits. Small hiatal hernia. No acute osseous pathology. IMPRESSION: No active cardiopulmonary disease. Electronically Signed   By: Anner Crete M.D.   On: 08/19/2021 00:35   CT HEAD WO CONTRAST (5MM)  Result Date: 08/18/2021 CLINICAL DATA:  Head trauma, moderate-severe heavily intoxicated, fall down steps, head trauma; Neck trauma (Age >= 65y) heavily intoxicated, fall down steps EXAM: CT HEAD WITHOUT CONTRAST CT CERVICAL SPINE WITHOUT CONTRAST TECHNIQUE: Multidetector CT imaging of the head and cervical spine was performed following the standard protocol without intravenous contrast. Multiplanar CT image reconstructions of the cervical spine were also generated. RADIATION DOSE REDUCTION: This exam was performed according to the departmental dose-optimization program which includes automated exposure control, adjustment of the mA and/or kV according to patient size and/or use of iterative reconstruction technique. COMPARISON:  None. FINDINGS: CT HEAD FINDINGS BRAIN: BRAIN Patchy and confluent areas of decreased attenuation are noted throughout the deep and periventricular white matter of the cerebral hemispheres bilaterally, compatible with chronic microvascular ischemic disease. No evidence of large-territorial acute infarction. No parenchymal hemorrhage. No mass lesion. No extra-axial collection. No mass effect or midline shift. No hydrocephalus. Basilar cisterns are patent. Vascular: No hyperdense vessel. Skull: No acute fracture or focal lesion. Sinuses/Orbits: Paranasal sinuses and mastoid air cells are clear. Bilateral lens replacement. Otherwise the orbits are unremarkable. Other: None. CT CERVICAL SPINE FINDINGS Alignment: Straightening of the normal cervical  lordosis likely due to positioning and degenerative changes. Skull base and vertebrae: Multilevel degenerative changes spine most prominent at the C5-C6 level. No acute fracture. No aggressive appearing focal osseous lesion or focal pathologic process. Soft tissues and spinal canal: No prevertebral fluid or swelling. No visible canal hematoma. Upper chest: Query trace right apical foci of pleural gas that could represents trace pneumothorax (7: 18-23). Other: None. IMPRESSION: 1. No acute intracranial abnormality. 2. No acute displaced fracture or traumatic listhesis of the cervical spine. 3. Query trace right apical foci of pleural gas that could represents trace pneumothorax. Electronically Signed   By: Iven Finn M.D.   On: 08/18/2021 23:57   CT Cervical Spine Wo Contrast  Result Date: 08/18/2021 CLINICAL DATA:  Head trauma, moderate-severe heavily intoxicated, fall down steps, head trauma; Neck trauma (Age >= 65y) heavily intoxicated, fall down steps EXAM: CT HEAD WITHOUT CONTRAST CT CERVICAL SPINE  WITHOUT CONTRAST TECHNIQUE: Multidetector CT imaging of the head and cervical spine was performed following the standard protocol without intravenous contrast. Multiplanar CT image reconstructions of the cervical spine were also generated. RADIATION DOSE REDUCTION: This exam was performed according to the departmental dose-optimization program which includes automated exposure control, adjustment of the mA and/or kV according to patient size and/or use of iterative reconstruction technique. COMPARISON:  None. FINDINGS: CT HEAD FINDINGS BRAIN: BRAIN Patchy and confluent areas of decreased attenuation are noted throughout the deep and periventricular white matter of the cerebral hemispheres bilaterally, compatible with chronic microvascular ischemic disease. No evidence of large-territorial acute infarction. No parenchymal hemorrhage. No mass lesion. No extra-axial collection. No mass effect or midline shift.  No hydrocephalus. Basilar cisterns are patent. Vascular: No hyperdense vessel. Skull: No acute fracture or focal lesion. Sinuses/Orbits: Paranasal sinuses and mastoid air cells are clear. Bilateral lens replacement. Otherwise the orbits are unremarkable. Other: None. CT CERVICAL SPINE FINDINGS Alignment: Straightening of the normal cervical lordosis likely due to positioning and degenerative changes. Skull base and vertebrae: Multilevel degenerative changes spine most prominent at the C5-C6 level. No acute fracture. No aggressive appearing focal osseous lesion or focal pathologic process. Soft tissues and spinal canal: No prevertebral fluid or swelling. No visible canal hematoma. Upper chest: Query trace right apical foci of pleural gas that could represents trace pneumothorax (7: 18-23). Other: None. IMPRESSION: 1. No acute intracranial abnormality. 2. No acute displaced fracture or traumatic listhesis of the cervical spine. 3. Query trace right apical foci of pleural gas that could represents trace pneumothorax. Electronically Signed   By: Iven Finn M.D.   On: 08/18/2021 23:57    Procedures Procedures    Medications Ordered in ED Medications  sodium chloride 0.9 % bolus 1,000 mL (0 mLs Intravenous Stopped 08/19/21 0115)  ondansetron (ZOFRAN) injection 4 mg (4 mg Intravenous Given 08/18/21 2308)    ED Course/ Medical Decision Making/ A&P                           Medical Decision Making Amount and/or Complexity of Data Reviewed External Data Reviewed: labs, radiology, ECG and notes. Labs: ordered. Radiology: ordered and independent interpretation performed. ECG/medicine tests: ordered and independent interpretation performed.  Risk Prescription drug management.   66 y.o. F presenting to the ED after a fall.  Drank 2 bottles of wine tonight and fell down approx 4 steps, landed on concrete.  Unclear if LOC.  She was able to ambulate to EMS stretcher.  She is clearly intoxicated on  arrival, dry heaving.  She is refusing to wear c-collar.  She has contusion to left occipital scalp without open wound/laceration.  She is not on anticoagulation.  Also voiced SI to EMS en route.  Hx of same, recent visits with psychiatry for depression.  No plan voiced.  Will get CT head/neck, EKG, labs.  CT head/neck negative.  Questionable apical pneumothorax noted on neck films, however CXR is negative.  She has no hypoxia, tachypnea, or increased WOB to suggest PTX.  Labs with ethanol of  205.  Will need to sober and then will plan for TTS consultation.  5:45 AM Patient awake/alert now.  VS remain stable.  She has been changed into burgundy scrubs, belongings secured.  Medically cleared.  TTS consulted pending.  Final Clinical Impression(s) / ED Diagnoses Final diagnoses:  Alcoholic intoxication without complication ([place])  Fall, initial encounter  Suicidal ideation    Rx /  DC Orders ED Discharge Orders     None         Kathryne Hitch 64/29/03 [id number]    Delora Fuel, MD 83/16/74 [phone]

## 2021-08-19 ENCOUNTER — Inpatient Hospital Stay
Admission: AD | Admit: 2021-08-19 | Discharge: 2021-08-23 | DRG: 885 | Disposition: A | Discharge status: Home / Self Care | Payer: Medicare Other | Source: Intra-hospital | Attending: Psychiatry | Admitting: Psychiatry

## 2021-08-19 ENCOUNTER — Encounter: Payer: Self-pay | Admitting: Psychiatry

## 2021-08-19 ENCOUNTER — Emergency Department (HOSPITAL_COMMUNITY): Payer: Medicare Other

## 2021-08-19 DIAGNOSIS — Y909 Presence of alcohol in blood, level not specified: Secondary | ICD-10-CM | POA: Diagnosis present

## 2021-08-19 DIAGNOSIS — Z743 Need for continuous supervision: Secondary | ICD-10-CM | POA: Diagnosis not present

## 2021-08-19 DIAGNOSIS — S0003XA Contusion of scalp, initial encounter: Secondary | ICD-10-CM | POA: Diagnosis not present

## 2021-08-19 DIAGNOSIS — R45851 Suicidal ideations: Secondary | ICD-10-CM | POA: Diagnosis present

## 2021-08-19 DIAGNOSIS — F10129 Alcohol abuse with intoxication, unspecified: Secondary | ICD-10-CM | POA: Diagnosis present

## 2021-08-19 DIAGNOSIS — Z9152 Personal history of nonsuicidal self-harm: Secondary | ICD-10-CM | POA: Diagnosis not present

## 2021-08-19 DIAGNOSIS — F332 Major depressive disorder, recurrent severe without psychotic features: Secondary | ICD-10-CM | POA: Diagnosis present

## 2021-08-19 DIAGNOSIS — Z79899 Other long term (current) drug therapy: Secondary | ICD-10-CM

## 2021-08-19 DIAGNOSIS — Z7985 Long-term (current) use of injectable non-insulin antidiabetic drugs: Secondary | ICD-10-CM | POA: Diagnosis not present

## 2021-08-19 DIAGNOSIS — F419 Anxiety disorder, unspecified: Secondary | ICD-10-CM | POA: Diagnosis present

## 2021-08-19 DIAGNOSIS — R531 Weakness: Secondary | ICD-10-CM | POA: Diagnosis not present

## 2021-08-19 DIAGNOSIS — K449 Diaphragmatic hernia without obstruction or gangrene: Secondary | ICD-10-CM | POA: Diagnosis not present

## 2021-08-19 DIAGNOSIS — G4700 Insomnia, unspecified: Secondary | ICD-10-CM | POA: Diagnosis present

## 2021-08-19 DIAGNOSIS — Z20822 Contact with and (suspected) exposure to covid-19: Secondary | ICD-10-CM | POA: Diagnosis not present

## 2021-08-19 HISTORY — DX: Headache, unspecified: R51.9

## 2021-08-19 LAB — RAPID URINE DRUG SCREEN, HOSP PERFORMED
Amphetamines: NOT DETECTED
Barbiturates: NOT DETECTED
Benzodiazepines: NOT DETECTED
Cocaine: NOT DETECTED
Opiates: NOT DETECTED
Tetrahydrocannabinol: NOT DETECTED

## 2021-08-19 IMAGING — CR DG CHEST 1V
1 series · 1 of 1 positions shown · non-contrast
Comparison: Chest radiograph dated [DATE].

CLINICAL DATA: Fall.

EXAM:
CHEST  1 VIEW

[chest ap]
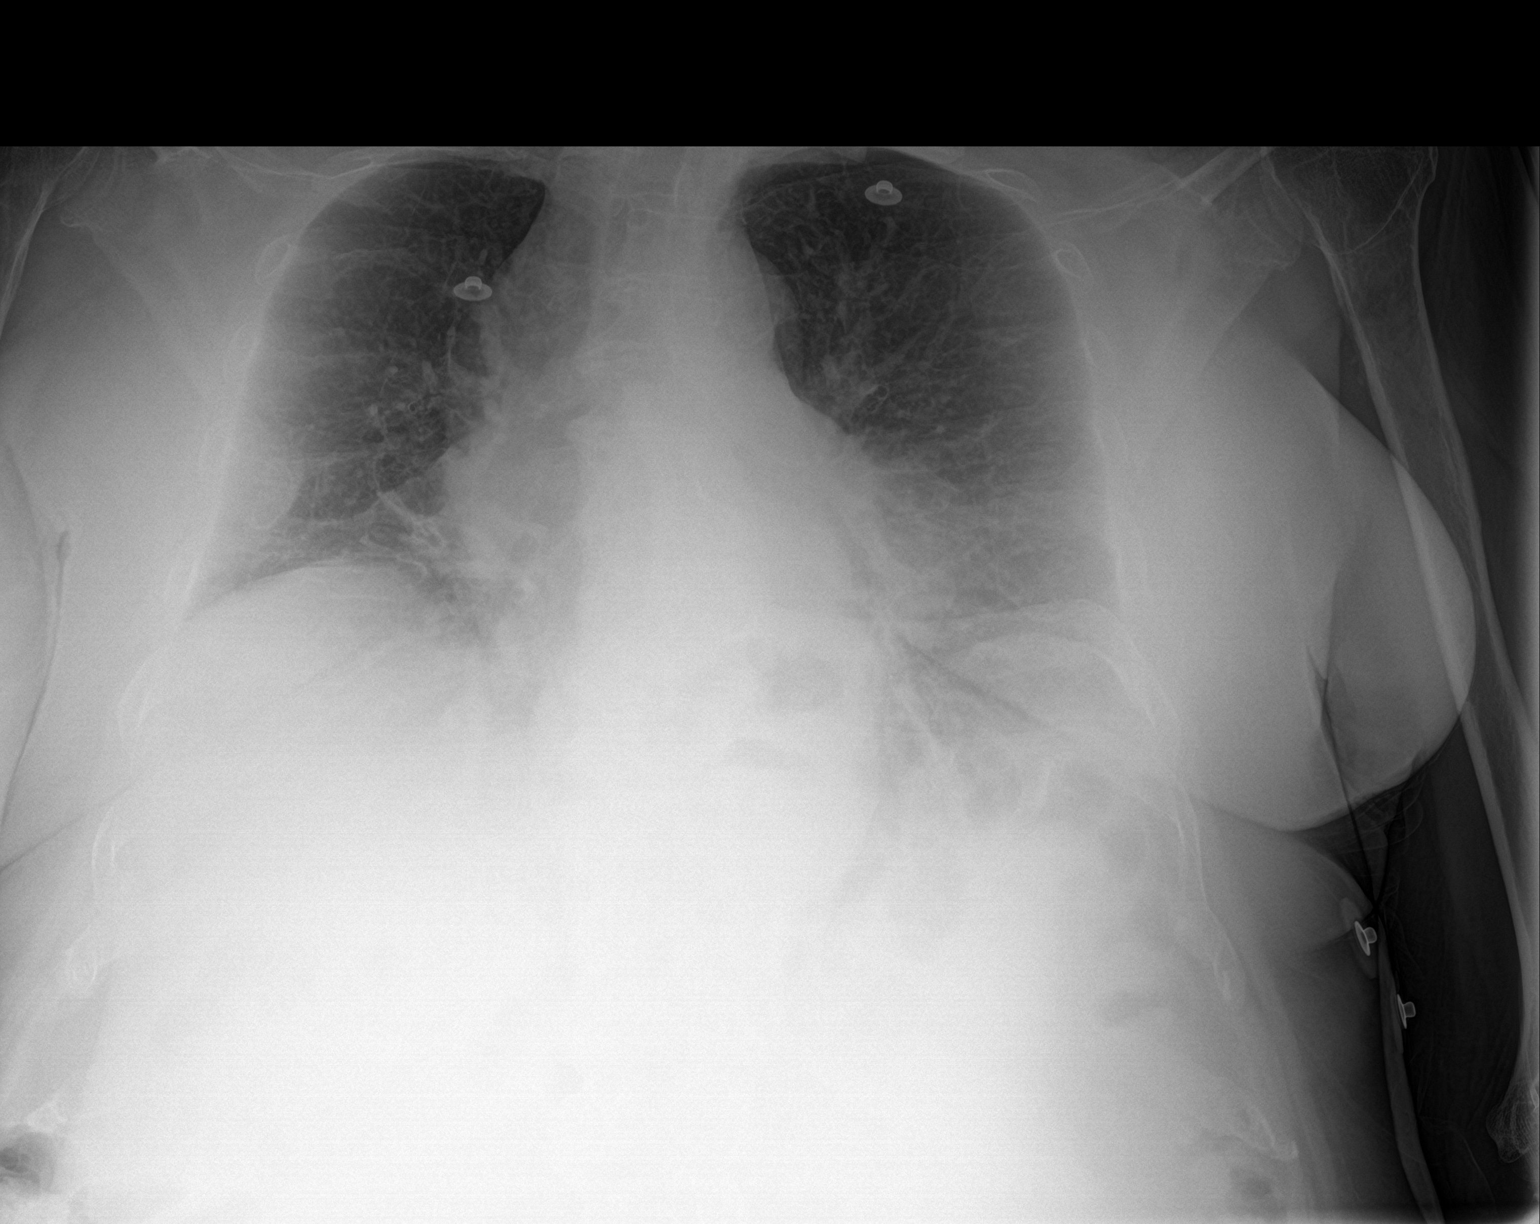

[1 of 1 positions shown; findings below may reference images not displayed]

FINDINGS: Shallow inspiration. No focal consolidation, pleural effusion, or
pneumothorax. The cardiac silhouette is within limits. Small hiatal
hernia. No acute osseous pathology.
IMPRESSION: No active cardiopulmonary disease.

## 2021-08-19 MED ORDER — TOPIRAMATE 25 MG PO TABS
50.0000 mg | ORAL_TABLET | Freq: Two times a day (BID) | ORAL | Status: DC
  Administered 2021-08-19 – 2021-08-23 (×8): 50 mg via ORAL
  Filled 2021-08-19 (×10): qty 2

## 2021-08-19 MED ORDER — TOPIRAMATE 25 MG PO TABS
50.0000 mg | ORAL_TABLET | Freq: Two times a day (BID) | ORAL | Status: DC
  Administered 2021-08-19: 50 mg via ORAL
  Filled 2021-08-19: qty 2

## 2021-08-19 MED ORDER — ALUM & MAG HYDROXIDE-SIMETH 200-200-20 MG/5ML PO SUSP: 30.0000 mL | ORAL | Status: DC | PRN

## 2021-08-19 MED ORDER — POTASSIUM CHLORIDE CRYS ER 20 MEQ PO TBCR
20.0000 meq | EXTENDED_RELEASE_TABLET | Freq: Every day | ORAL | Status: DC
  Administered 2021-08-20 – 2021-08-23 (×4): 20 meq via ORAL
  Filled 2021-08-19 (×4): qty 1

## 2021-08-19 MED ORDER — LORAZEPAM 1 MG PO TABS
1.0000 mg | ORAL_TABLET | ORAL | Status: DC | PRN
  Administered 2021-08-19: 1 mg via ORAL
  Filled 2021-08-19: qty 1

## 2021-08-19 MED ORDER — POTASSIUM CHLORIDE CRYS ER 20 MEQ PO TBCR
20.0000 meq | EXTENDED_RELEASE_TABLET | Freq: Every day | ORAL | Status: DC
Start: 2021-08-19 — End: 2021-08-19
  Administered 2021-08-19: 20 meq via ORAL
  Filled 2021-08-19: qty 1

## 2021-08-19 MED ORDER — ACETAMINOPHEN 325 MG PO TABS
650.0000 mg | ORAL_TABLET | Freq: Four times a day (QID) | ORAL | Status: DC | PRN
  Administered 2021-08-19 – 2021-08-20 (×2): 650 mg via ORAL
  Filled 2021-08-19 (×2): qty 2

## 2021-08-19 MED ORDER — DULOXETINE HCL 30 MG PO CPEP
30.0000 mg | ORAL_CAPSULE | Freq: Every day | ORAL | Status: DC
  Administered 2021-08-19: 30 mg via ORAL
  Filled 2021-08-19 (×2): qty 1

## 2021-08-19 MED ORDER — TRAZODONE HCL 50 MG PO TABS
100.0000 mg | ORAL_TABLET | Freq: Every evening | ORAL | Status: DC | PRN
  Administered 2021-08-19 – 2021-08-22 (×3): 100 mg via ORAL
  Filled 2021-08-19 (×3): qty 2

## 2021-08-19 MED ORDER — ESCITALOPRAM OXALATE 10 MG PO TABS
20.0000 mg | ORAL_TABLET | Freq: Every day | ORAL | Status: DC
  Administered 2021-08-19: 20 mg via ORAL
  Filled 2021-08-19: qty 2

## 2021-08-19 MED ORDER — MAGNESIUM HYDROXIDE 400 MG/5ML PO SUSP: 30.0000 mL | Freq: Every day | ORAL | Status: DC | PRN

## 2021-08-19 MED ORDER — DULOXETINE HCL 30 MG PO CPEP: 30.0000 mg | ORAL_CAPSULE | Freq: Every day | ORAL | Status: DC

## 2021-08-19 MED ORDER — TRAZODONE HCL 100 MG PO TABS: 100.0000 mg | ORAL_TABLET | Freq: Every evening | ORAL | Status: DC | PRN

## 2021-08-19 MED ORDER — ESCITALOPRAM OXALATE 10 MG PO TABS
20.0000 mg | ORAL_TABLET | Freq: Every day | ORAL | Status: DC
  Administered 2021-08-20: 20 mg via ORAL
  Filled 2021-08-19: qty 2

## 2021-08-19 MED ORDER — OLANZAPINE 5 MG PO TABS: 10.0000 mg | ORAL_TABLET | ORAL | Status: DC | PRN

## 2021-08-19 NOTE — ED Notes (Addendum)
TTS machine in room, pt awaken and awaiting on Counselor on TTS.  074: Pt talking to TTS

## 2021-08-19 NOTE — ED Notes (Signed)
Breakfast orders Placed °

## 2021-08-19 NOTE — ED Notes (Signed)
Pt's breakfast has arrived. Pt sat up and eating her breakfast. Pt complaining of headache, but pt made aware that RN occupied but once she is free, will provide with meds

## 2021-08-19 NOTE — ED Provider Notes (Signed)
I was asked by nursing to come assess patient and fill out EMTALA documentation for transfer to [facility] to the care of Dr. Louis Meckel for further psychiatric management.  I assessed patient and she is resting comfortably.  Lungs clear and chest nontender.  Patient was not hungry to eat lunch but agrees with transfer.  EMTALA documentation filled out for transfer.   Kishan Wachsmuth, Gwenyth Allegra, MD 08/19/21 1356

## 2021-08-19 NOTE — ED Notes (Addendum)
Just called safe transport to get patient to [facility]

## 2021-08-19 NOTE — ED Notes (Signed)
Report called to [place], Therapist, sports at [facility]

## 2021-08-19 NOTE — BH Assessment (Signed)
Comprehensive Clinical Assessment (CCA) Note  08/19/2021 Tracie Atkins [id number]  Disposition: Per Earleen Newport, NP, patient is recommended for inpatient treatment.   [place] ED from 08/18/2021 in [place] [facility] ED from 06/12/2018 in [place] [facility] ED from 01/17/2018 in [place] DEPT  C-SSRS RISK CATEGORY Moderate Risk High Risk High Risk      The patient demonstrates the following risk factors for suicide: Chronic risk factors for suicide include: psychiatric disorder of MDD and substance use disorder. Acute risk factors for suicide include: family or marital conflict and social withdrawal/isolation. Protective factors for this patient include: hope for the future. Considering these factors, the overall suicide risk at this point appears to be moderate. Patient is not appropriate for outpatient follow up.   Tracie Atkins is a 66 year old female presenting to [facility] with chief complaint of a fall while intoxicated and suicidal ideations. Patient reports I wanted to hurt myself.  Patient reports she does not remember much from yesterday, however reports she was upset and aggravated with her son because he was ignoring her for the past few days. Patient reports she does not remember falling downstairs, however, reports she met up with her other son and started drinking yesterday out of frustration. Patient reports she drunk two big bottles of alcohol.  Patient reports she moved from [place] to [place] at the beginning of the year after leaving rehab. Patient reports she was tired of drinking and drugging and since being here she has been clean from crack and relapsed on alcohol yesterday. Patient reports worsening depression for the past couple of days and reports daily crying spells, isolation, low mood and energy, poor sleep (4hours) and fluctuating appetite. Patient report taking psychotropic medications  however she ran out a couple of days ago. Patient reports her son was supposed to help her get outpatient services, but it hasn't happened yet.      Patient reports diagnosis of depression and anxiety and she does not have any outpatient providers currently. Patient reports history of psychiatric hospitalizations and rehab when she was in [place]. Patient lives at home with her son, his girlfriend, two kids and a roommate. Patient reports she has another son that live in [place] however, she has conflict with her son's wife. Patient receives SSI and she is not working. Patient denies legal issues and access to a firearm.   Patient is oriented x4, engaged, alert and cooperative during assessment. Patient reports suicidal ideations and reports that she is tired and depressed and acknowledges having thoughts about not wanting to be alive. Patient reports she has had thoughts about getting ran over by a car. Patient has never attempted suicide. Patient denies HI and AVH. Patient reports she needs help with her mental health and is willing to receive treatment.   Chief Complaint:  Chief Complaint  Patient presents with   Fall   Visit Diagnosis: MDD (major depressive disorder), recurrent episode, severe without psychotic features. Alcohol Abuse     CCA Screening, Triage and Referral (STR)  Patient Reported Information How did you hear about [place]? Family/Friend  What Is the Reason for Your Visit/Call Today? BIB EMS s/p fall down approx. 3-4 stairs after drinking 2 bottles of wine onto concrete. Ambulatory on scene with EMS. Per pt, hit head. No blood thinners     Per EMS, pt has been sober for a while and relapsed tonight and endorsed SI.  How Long Has This Been Causing You Problems?  1-6 months  What Do You Feel Would Help You the Most Today? Treatment for Depression or other mood problem; Alcohol or Drug Use Treatment   Have You Recently Had Any Thoughts About Hurting Yourself? Yes  Are You Planning  to Commit Suicide/Harm Yourself At This time? No   Have you Recently Had Thoughts About [place]? No  Are You Planning to Harm Someone at This Time? No  Explanation: No data recorded  Have You Used Any Alcohol or Drugs in the Past 24 Hours? Yes  How Long Ago Did You Use Drugs or Alcohol? No data recorded What Did You Use and How Much? "2 big bottles of alcohol"   Do You Currently Have a Therapist/Psychiatrist? No  Name of Therapist/Psychiatrist: No data recorded  Have You Been Recently Discharged From Any Office Practice or Programs? No  Explanation of Discharge From Practice/Program: No data recorded    CCA Screening Triage Referral Assessment Type of Contact: Tele-Assessment  Telemedicine Service Delivery: Telemedicine service delivery: This service was provided via telemedicine using a 2-way, interactive audio and video technology  Is this Initial or Reassessment? Initial Assessment  Date Telepsych consult ordered in [facility]:  08/19/21  Time Telepsych consult ordered in [facility]:  0815  Location of Assessment: [facility] Assessment Services  Provider Location: [facility] Assessment Services   Collateral Involvement: No data recorded  Does Patient Have a Stage manager Guardian? No data recorded Name and Contact of Legal Guardian: No data recorded If Minor and Not Living with Parent(s), Who has Custody? No data recorded Is CPS involved or ever been involved? No data recorded Is APS involved or ever been involved? No data recorded  Patient Determined To Be At Risk for Harm To Self or Others Based on Review of Patient Reported Information or Presenting Complaint? Yes, for Self-Harm  Method: No data recorded Availability of Means: No data recorded Intent: No data recorded Notification Required: No data recorded Additional Information for Danger to Others Potential: No data recorded Additional Comments for Danger to Others Potential: No data recorded Are There  Guns or Other Weapons in Your Home? No data recorded Types of Guns/Weapons: No data recorded Are These Weapons Safely Secured?                            No data recorded Who Could Verify You Are Able To Have These Secured: No data recorded Do You Have any Outstanding Charges, Pending Court Dates, Parole/Probation? No data recorded Contacted To Inform of Risk of Harm To Self or Others: No data recorded   Does Patient Present under Involuntary Commitment? No  IVC Papers Initial File Date: No data recorded  [place] of Residence: [place]   Patient Currently Receiving the Following Services: Not Receiving Services   Determination of Need: Routine (7 days)   Options For Referral: Medication Management; Outpatient Therapy; Facility-Based Crisis; Inpatient Hospitalization; Intensive Outpatient Therapy     CCA Biopsychosocial Patient Reported Schizophrenia/Schizoaffective Diagnosis in Past: No   Strengths: No data recorded  Mental Health Symptoms Depression:   Change in energy/activity; Hopelessness; Irritability; Increase/decrease in appetite; Sleep (too much or little); Tearfulness; Worthlessness   Duration of Depressive symptoms:  Duration of Depressive Symptoms: Greater than two weeks   Mania:   None   Anxiety:    Worrying; Tension   Psychosis:   None   Duration of Psychotic symptoms:    Trauma:   None   Obsessions:  None   Compulsions:   None   Inattention:   None   Hyperactivity/Impulsivity:   None   Oppositional/Defiant Behaviors:   None   Emotional Irregularity:   Intense/unstable relationships; Intense/inappropriate anger   Other Mood/Personality Symptoms:  No data recorded   Mental Status Exam Appearance and self-care  Stature:   Average   Weight:   Average weight   Clothing:   Age-appropriate   Grooming:   Normal   Cosmetic use:   None   Posture/gait:   Normal   Motor activity:   Not Remarkable   Sensorium  Attention:    Normal   Concentration:   Normal   Orientation:   Person; Place; Situation   Recall/memory:   Defective in Short-term   Affect and Mood  Affect:   Depressed   Mood:   Depressed   Relating  Eye contact:   Fleeting   Facial expression:   Depressed   Attitude toward examiner:   Cooperative   Thought and Language  Speech flow:  Clear and Coherent   Thought content:   Appropriate to Mood and Circumstances   Preoccupation:   None   Hallucinations:   None   Organization:  No data recorded  [employer] of Knowledge:   Fair   Intelligence:   Above Average   Abstraction:   Normal   Judgement:   Fair   Art therapist:   Adequate   Insight:   Fair   Decision Making:   Impulsive   Social Functioning  Social Maturity:   Impulsive   Social Judgement:   Normal; Victimized   Stress  Stressors:   Family conflict; Transitions   Coping Ability:   Overwhelmed; Exhausted   Skill Deficits:   None   Supports:   Family; Support needed     Religion:    Leisure/Recreation:    Exercise/Diet: Exercise/Diet Do You Have Any Trouble Sleeping?: Yes   CCA Employment/Education Employment/Work Situation: Employment / Work Situation Employment Situation: On disability Patient's Job has Been Impacted by Current Illness: No Has Patient ever Been in Passenger transport manager?: No  Education: Education Is Patient Currently Attending School?: No   CCA Family/Childhood History Family and Relationship History: Family history Does patient have children?: Yes How many children?: 2 How is patient's relationship with their children?: conflict with son  Childhood History:     Child/Adolescent Assessment:     CCA Substance Use Alcohol/Drug Use: Alcohol / Drug Use Pain Medications: See MAR Prescriptions: See MAR Over the Counter: See MAR History of alcohol / drug use?: Yes Longest period of sobriety (when/how long): Sober for 4  months this year. Relapsed on ETOH yesterday Negative Consequences of Use: Personal relationships Substance #1 Name of Substance 1: ETOH 1 - Last Use / Amount: 08/18/21/ "2 big bottles of alcohol" Substance #2 Name of Substance 2: Crack 2 - Last Use / Amount: 4 months ago                     ASAM's:  Six Dimensions of Multidimensional Assessment  Dimension 1:  Acute Intoxication and/or Withdrawal Potential:      Dimension 2:  Biomedical Conditions and Complications:      Dimension 3:  Emotional, Behavioral, or Cognitive Conditions and Complications:     Dimension 4:  Readiness to Change:     Dimension 5:  Relapse, Continued use, or Continued Problem Potential:     Dimension 6:  Recovery/Living Environment:  ASAM Severity Score:    ASAM Recommended Level of Treatment: ASAM Recommended Level of Treatment: Level II Intensive Outpatient Treatment   Substance use Disorder (SUD)    Recommendations for Services/Supports/Treatments: Recommendations for Services/Supports/Treatments Recommendations For Services/Supports/Treatments: IOP (Intensive Outpatient Program), Peer Support  Discharge Disposition:    DSM5 Diagnoses: Patient Active Problem List   Diagnosis Date Noted   Suicidal ideations    Alcohol abuse with alcohol-induced mood disorder ([place]) 11/20/2016     Referrals to Alternative Service(s): Referred to Alternative Service(s):   Place:   Date:   Time:    Referred to Alternative Service(s):   Place:   Date:   Time:    Referred to Alternative Service(s):   Place:   Date:   Time:    Referred to Alternative Service(s):   Place:   Date:   Time:     Luther Redo, [facility]

## 2021-08-19 NOTE — ED Notes (Signed)
Pt walking around the room and stating "I don't feel so good", this writer asked pt if everything was okay which pt responds "I feel like throwing up". Blue emesis bag provided to pt, RN made aware.

## 2021-08-19 NOTE — ED Notes (Signed)
Patient signed voluntary paperwork for Psychiatric Admissions to [facility]

## 2021-08-19 NOTE — BH Assessment (Signed)
Consult complete. Disposition pending provider recommendations.

## 2021-08-19 NOTE — ED Notes (Signed)
Patient was given a cup of ice water. 

## 2021-08-19 NOTE — Progress Notes (Signed)
Patient admitted voluntarily to [facility] from [facility] ED with diagnosis of worsening depression. Patient stated she is here because she was told she fell but she doesn't remember falling. Patient presents to unit via stretcher with EMS. She is ambulatory and A&Ox4.  Patient's affect is flat. Speech is clear and thoughts are organized. Patient endorses depression at 10/10. Patient currently denies having anxiety, suicidal ideations, homicidal ideations, audio or visual hallucinations and verbally contracts for safety on unit. Reports having a headache at 10/10. Will give PRN pain medication. Pt denies having a poor appetite- dinner was served in the day room- appetite is good. 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  Patient reports living with her son in [place]. Her goal for her stay here is to talk with someone and get help.  Emotional support and reassurance provided throughout admission intake. Afterwards, oriented patient to unit, room and call light, reviewed POC with all questions answered and understanding verbalzied. Denies any needs at this time.  Will continue to monitor with ongoing Q 15 minute safety checks per unit protocol.

## 2021-08-19 NOTE — ED Notes (Signed)
EKG completed. EKG results given to RN on floor

## 2021-08-19 NOTE — ED Notes (Signed)
Pt's lunch has arrived. Pt sitting up and eating her lunch

## 2021-08-19 NOTE — Progress Notes (Signed)
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     Signed:  Durenda Hurt, MSW, LCSWA, LCAS 08/19/2021 1:49 PM

## 2021-08-19 NOTE — BH Assessment (Signed)
Patient is to be admitted to [facility] today 08/19/21 by Dr.  Louis Meckel .  Attending Physician will be Dr.  Louis Meckel .   Patient has been assigned to room L35, by [place] Nurse, Stanton Kidney.   Call for report to: [phone].   ER staff is aware of the admission  Seth Bake, Patient Access.

## 2021-08-20 DIAGNOSIS — F332 Major depressive disorder, recurrent severe without psychotic features: Principal | ICD-10-CM

## 2021-08-20 LAB — LIPID PANEL
Cholesterol: 130 mg/dL (ref 0–200)
HDL: 36 mg/dL — ABNORMAL LOW (ref >40)
LDL Cholesterol: 55 mg/dL (ref 0–99)
Total CHOL/HDL Ratio: 3.6 RATIO
Triglycerides: 193 mg/dL — ABNORMAL HIGH (ref <150)
VLDL: 39 mg/dL (ref 0–40)

## 2021-08-20 MED ORDER — RISPERIDONE 1 MG PO TABS
0.5000 mg | ORAL_TABLET | ORAL | Status: DC
  Administered 2021-08-20 – 2021-08-23 (×7): 0.5 mg via ORAL
  Filled 2021-08-20 (×7): qty 1

## 2021-08-20 MED ORDER — IBUPROFEN 200 MG PO TABS
600.0000 mg | ORAL_TABLET | Freq: Four times a day (QID) | ORAL | Status: DC | PRN
  Administered 2021-08-20 – 2021-08-22 (×3): 600 mg via ORAL
  Filled 2021-08-20 (×3): qty 3

## 2021-08-20 MED ORDER — FLUOXETINE HCL 20 MG PO CAPS
20.0000 mg | ORAL_CAPSULE | Freq: Every day | ORAL | Status: DC
  Administered 2021-08-21 – 2021-08-23 (×3): 20 mg via ORAL
  Filled 2021-08-20 (×3): qty 1

## 2021-08-20 MED ORDER — QUETIAPINE FUMARATE 100 MG PO TABS
200.0000 mg | ORAL_TABLET | Freq: Every day | ORAL | Status: DC
  Administered 2021-08-20 – 2021-08-22 (×3): 200 mg via ORAL
  Filled 2021-08-20 (×3): qty 2

## 2021-08-20 NOTE — [facility] Suicide Risk Assessment (Signed)
[facility] Admission Suicide Risk Assessment   Nursing information obtained from:  Patient Demographic factors:  Caucasian, Age 66 or older Current Mental Status:  NA Loss Factors:  NA Historical Factors:  NA Risk Reduction Factors:  Living with another person, especially a relative  Total Time spent with patient: 1 hour Principal Problem: MDD (major depressive disorder), recurrent severe, without psychosis ([place]) Diagnosis:  Principal Problem:   MDD (major depressive disorder), recurrent severe, without psychosis ([place])  Subjective Data: 66 year old female fell after drinking two glasses of wine with minor head injury and negative CT scan of head.  She endorsed suicidal ideation.  Continued Clinical Symptoms:  Alcohol Use Disorder Identification Test Final Score (AUDIT): 5 The "Alcohol Use Disorders Identification Test", Guidelines for Use in Primary Care, Second Edition.  World [facility]). Score between 0-7:  no or low risk or alcohol related problems. Score between 8-15:  moderate risk of alcohol related problems. Score between 16-19:  high risk of alcohol related problems. Score 20 or above:  warrants further diagnostic evaluation for alcohol dependence and treatment.   CLINICAL FACTORS:   Alcohol/Substance Abuse/Dependencies   Musculoskeletal: Strength & Muscle Tone: within normal limits Gait & Station: normal Patient leans: N/A  Psychiatric Specialty Exam:  Presentation  General Appearance: Appropriate for Environment; Casual  Eye Contact:Good  Speech:Clear and Coherent; Normal Rate  Speech Volume:Normal  Handedness:Right   Mood and Affect  Mood:Euthymic  Affect:Appropriate; Congruent   Thought Process  Thought Processes:Coherent  Descriptions of Associations:Intact  Orientation:Full (Time, Place and Person)  Thought Content:Logical  History of Schizophrenia/Schizoaffective disorder:No  Duration of Psychotic Symptoms:No data  recorded Hallucinations:No data recorded Ideas of Reference:None  Suicidal Thoughts:No data recorded Homicidal Thoughts:No data recorded  Sensorium  Memory:Immediate Good; Recent Good; Remote Good  Judgment:Good  Insight:Good   Executive Functions  Concentration:Good  Attention Span:Good  [place]  Language:Good   Psychomotor Activity  Psychomotor Activity:No data recorded  Assets  Assets:Communication Skills; Desire for Improvement; Financial Resources/Insurance; Housing; Physical Health; Transportation   Sleep  Sleep:No data recorded   Physical Exam: Physical Exam Vitals and nursing note reviewed.  Constitutional:      Appearance: Normal appearance. She is normal weight.  Neurological:     General: No focal deficit present.     Mental Status: She is alert and oriented to person, place, and time.  Psychiatric:        Attention and Perception: Attention and perception normal.        Mood and Affect: Mood is depressed. Affect is flat.        Speech: Speech normal.        Behavior: Behavior normal. Behavior is cooperative.        Thought Content: Thought content normal.        Cognition and Memory: Cognition normal. Memory is impaired.        Judgment: Judgment is impulsive.   Review of Systems  Constitutional: Negative.   HENT: Negative.    Eyes: Negative.   Respiratory: Negative.    Cardiovascular: Negative.   Gastrointestinal: Negative.   Genitourinary: Negative.   Musculoskeletal: Negative.   Skin: Negative.   Neurological: Negative.   Endo/Heme/Allergies: Negative.   Psychiatric/Behavioral:  Positive for depression, memory loss and suicidal ideas.   Blood pressure 127/78, pulse 93, temperature 97.9 F (36.6 C), temperature source Oral, resp. rate 18, height 5\' 4"  (1.626 m), weight 74.8 kg, SpO2 98 %. Body mass index is 28.32 kg/m.  COGNITIVE FEATURES THAT CONTRIBUTE TO RISK:  None    SUICIDE RISK:   Minimal:  No identifiable suicidal ideation.  Patients presenting with no risk factors but with morbid ruminations; may be classified as minimal risk based on the severity of the depressive symptoms  PLAN OF CARE: See Orders  I certify that inpatient services furnished can reasonably be expected to improve the patient's condition.   [place], DO 08/20/2021, 10:14 AM

## 2021-08-20 NOTE — H&P (Signed)
Psychiatric Admission Assessment Adult  Patient Identification: Tracie [place] MRN:  [id number] Date of Evaluation:  08/20/2021 Chief Complaint:  MDD (major depressive disorder), recurrent severe, without psychosis ([place]) [F33.2] Principal Diagnosis: MDD (major depressive disorder), recurrent severe, without psychosis ([place]) Diagnosis:  Principal Problem:   MDD (major depressive disorder), recurrent severe, without psychosis ([place])  History of Present Illness:  Tracie [place] is a 66 year old white female who presents to the geriatric psychiatry with suicidal ideation on a voluntary basis.  Tracie Atkins to be with her son in [place].  There was some type of argument that she will tell me about what.  She lives with her son Tracie Atkins but was at her son Tracie Atkins's house and for some reason was in his car drinking wine.  After that, she does not remember anything but she apparently fell and hit her head.  She does not know who called 911.  While in the emergency room she stated that she was suicidal.  She does have a history of self-harm and has been hospitalized in the past in [place].  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  She states that she has been clean and sober since December when she moved to [place].  She endorses anhedonia, difficulty sleeping, depressed mood and anxiety.  She states that she ran out of her medications about a month ago.  Apparently, it was Cymbalta, Lexapro and she states Seroquel and trazodone. She states that she hopes she can still live with her son otherwise she is going back to [place].  She denies any auditory or visual hallucinations.  She is able to contract for safety while in the hospital.   PER INITIAL INTAKE: Tracie [place] is a 66 year old female presenting to [facility] with chief complaint of a fall while intoxicated and suicidal ideations. Patient reports I wanted to hurt myself.  Patient  reports she does not remember much from yesterday, however reports she was upset and aggravated with her son because he was ignoring her for the past few days. Patient reports she does not remember falling downstairs, however, reports she met up with her other son and started drinking yesterday out of frustration. Patient reports she drunk two big bottles of alcohol.   Patient reports diagnosis of depression and anxiety and she does not have any outpatient providers currently. Patient reports history of psychiatric hospitalizations and rehab when she was in [place]. Patient lives at home with her son, his girlfriend, two kids and a roommate. Patient reports she has another son that live in [place] however, she has conflict with her son's wife. Patient receives SSI and she is not working. Patient denies legal issues and access to a firearm.    Patient is oriented x4, engaged, alert and cooperative during assessment. Patient reports suicidal ideations and reports that she is tired and depressed and acknowledges having thoughts about not wanting to be alive. Patient reports she has had thoughts about getting ran over by a car. Patient has never attempted suicide. Patient denies HI and AVH. Patient reports she needs help with her mental health and is willing to receive treatment.   Associated Signs/Symptoms: Depression Symptoms:  depressed mood, anhedonia, insomnia, suicidal thoughts without plan, Duration of Depression Symptoms: Greater than two weeks  (Hypo) Manic Symptoms:  Impulsivity, Irritable Mood, Anxiety Symptoms:  Excessive Worry, Psychotic Symptoms:   None PTSD Symptoms: NA Total Time spent with patient: 1  hour  Past Psychiatric History: As Above  Is the patient at risk to self? Yes.    Has the patient been a risk to self in the past 6 months? Yes.    Has the patient been a risk to self within the distant past? Yes.    Is the patient a risk to others? No.  Has the patient been a risk to  others in the past 6 months? No.  Has the patient been a risk to others within the distant past? No.   Prior Inpatient Therapy:   Prior Outpatient Therapy:    Alcohol Screening: 1. How often do you have a drink containing alcohol?: Never 2. How many drinks containing alcohol do you have on a typical day when you are drinking?: 1 or 2 3. How often do you have six or more drinks on one occasion?: Never AUDIT-C Score: 0 4. How often during the last year have you found that you were not able to stop drinking once you had started?: Never 5. How often during the last year have you failed to do what was normally expected from you because of drinking?: Never 6. How often during the last year have you needed a first drink in the morning to get yourself going after a heavy drinking session?: Never 7. How often during the last year have you had a feeling of guilt of remorse after drinking?: Never 8. How often during the last year have you been unable to remember what happened the night before because you had been drinking?: Less than monthly 9. Have you or someone else been injured as a result of your drinking?: Yes, during the last year 10. Has a relative or friend or a doctor or another health worker been concerned about your drinking or suggested you cut down?: No Alcohol Use Disorder Identification Test Final Score (AUDIT): 5 Substance Abuse History in the last 12 months:  Yes.   Consequences of Substance Abuse: Family Consequences:  Living arrangement Previous Psychotropic Medications: Yes  Psychological Evaluations: No  Past Medical History:  Past Medical History:  Diagnosis Date   Chronic dental infection 8/189   multiple teeth removed and Post -op infection.   Depression    ETOH abuse    Headache     Past Surgical History:  Procedure Laterality Date   DENTAL SURGERY     TUBAL LIGATION     Family History: History reviewed. No pertinent family history. Family Psychiatric  History:  Unremarkable Tobacco Screening:   Social History:  Social History   Substance and Sexual Activity  Alcohol Use Not Currently   Alcohol/week: 2.0 standard drinks   Types: 2 Cans of beer per week     Social History   Substance and Sexual Activity  Drug Use No    Additional Social History:     Patient reports she moved from [place] to [place] at the beginning of the year after leaving rehab. Patient reports she was tired of drinking and drugging and since being here she has been clean from crack and relapsed on alcohol yesterday. Patient reports worsening depression for the past couple of days and reports daily crying spells, isolation, low mood and energy, poor sleep (4hours) and fluctuating appetite. Patient report taking psychotropic medications however she ran out a couple of days ago. Patient reports her son was supposed to help her get outpatient services, but it hasn't happened yet.  Allergies:  No Known Allergies Lab Results:  Results for orders placed or performed during the hospital encounter of 08/18/21 (from the past 48 hour(s))  Resp Panel by RT-PCR (Flu A&B, Covid) Nasopharyngeal Swab     Status: None   Collection Time: 08/18/21 10:52 PM   Specimen: Nasopharyngeal Swab; Nasopharyngeal(NP) swabs in vial transport medium  Result Value Ref Range   SARS Coronavirus 2 by RT PCR NEGATIVE NEGATIVE    Comment: (NOTE) SARS-CoV-2 target nucleic acids are NOT DETECTED.  The SARS-CoV-2 RNA is generally detectable in upper respiratory specimens during the acute phase of infection. The lowest concentration of SARS-CoV-2 viral copies this assay can detect is 138 copies/mL. A negative result does not preclude SARS-Cov-2 infection and should not be used as the sole basis for treatment or other patient management decisions. A negative result may occur with  improper specimen collection/handling, submission of specimen other than nasopharyngeal swab, presence of  viral mutation(s) within the areas targeted by this assay, and inadequate number of viral copies(<138 copies/mL). A negative result must be combined with clinical observations, patient history, and epidemiological information. The expected result is Negative.  Fact Sheet for Patients:  EntrepreneurPulse.com.au  Fact Sheet for Healthcare Providers:  IncredibleEmployment.be  This test is no t yet approved or cleared by the [place] FDA and  has been authorized for detection and/or diagnosis of SARS-CoV-2 by FDA under an Emergency Use Authorization (EUA). This EUA will remain  in effect (meaning this test can be used) for the duration of the COVID-19 declaration under Section 564(b)(1) of the Act, 21 U.S.C.section 360bbb-3(b)(1), unless the authorization is terminated  or revoked sooner.       Influenza A by PCR NEGATIVE NEGATIVE   Influenza B by PCR NEGATIVE NEGATIVE    Comment: (NOTE) The Xpert Xpress SARS-CoV-2/FLU/RSV plus assay is intended as an aid in the diagnosis of influenza from Nasopharyngeal swab specimens and should not be used as a sole basis for treatment. Nasal washings and aspirates are unacceptable for Xpert Xpress SARS-CoV-2/FLU/RSV testing.  Fact Sheet for Patients: EntrepreneurPulse.com.au  Fact Sheet for Healthcare Providers: IncredibleEmployment.be  This test is not yet approved or cleared by the [place] FDA and has been authorized for detection and/or diagnosis of SARS-CoV-2 by FDA under an Emergency Use Authorization (EUA). This EUA will remain in effect (meaning this test can be used) for the duration of the COVID-19 declaration under Section 564(b)(1) of the Act, 21 U.S.C. section 360bbb-3(b)(1), unless the authorization is terminated or revoked.  Performed at [facility], [place] [address] [place], [place] [address]   CBC with Differential     Status: None    Collection Time: 08/18/21 11:00 PM  Result Value Ref Range   WBC 8.0 4.0 - 10.5 K/uL   RBC 4.44 3.87 - 5.11 MIL/uL   Hemoglobin 12.9 12.0 - 15.0 g/dL   HCT 39.3 36.0 - 46.0 %   MCV 88.5 80.0 - 100.0 fL   MCH 29.1 26.0 - 34.0 pg   MCHC 32.8 30.0 - 36.0 g/dL   RDW 14.3 11.5 - 15.5 %   Platelets 241 150 - 400 K/uL   nRBC 0.0 0.0 - 0.2 %   Neutrophils Relative % 67 %   Neutro Abs 5.5 1.7 - 7.7 K/uL   Lymphocytes Relative 21 %   Lymphs Abs 1.7 0.7 - 4.0 K/uL   Monocytes Relative 9 %   Monocytes Absolute 0.7 0.1 - 1.0 K/uL   Eosinophils Relative 1 %  Eosinophils Absolute 0.1 0.0 - 0.5 K/uL   Basophils Relative 1 %   Basophils Absolute 0.0 0.0 - 0.1 K/uL   Immature Granulocytes 1 %   Abs Immature Granulocytes 0.06 0.00 - 0.07 K/uL    Comment: Performed at [facility] [address], [place], [place] [address]  Comprehensive metabolic panel     Status: Abnormal   Collection Time: 08/18/21 11:00 PM  Result Value Ref Range   Sodium 142 135 - 145 mmol/L   Potassium 3.1 (L) 3.5 - 5.1 mmol/L   Chloride 111 98 - 111 mmol/L   CO2 23 22 - 32 mmol/L   Glucose, Bld 180 (H) 70 - 99 mg/dL    Comment: Glucose reference range applies only to samples taken after fasting for at least 8 hours.   BUN 12 8 - 23 mg/dL   Creatinine, Ser 0.75 0.44 - 1.00 mg/dL   Calcium 9.2 8.9 - 10.3 mg/dL   Total Protein 6.1 (L) 6.5 - 8.1 g/dL   Albumin 3.3 (L) 3.5 - 5.0 g/dL   AST 16 15 - 41 U/L   ALT 15 0 - 44 U/L   Alkaline Phosphatase 149 (H) 38 - 126 U/L   Total Bilirubin 0.2 (L) 0.3 - 1.2 mg/dL   GFR, Estimated >60 >60 mL/min    Comment: (NOTE) Calculated using the CKD-EPI Creatinine Equation (2021)    Anion gap 8 5 - 15    Comment: Performed at [facility], [place] [address], [place], [place] [address]  Ethanol     Status: Abnormal   Collection Time: 08/18/21 11:00 PM  Result Value Ref Range   Alcohol, Ethyl (B) 205 (H) <10 mg/dL    Comment: (NOTE) Lowest detectable limit for serum  alcohol is 10 mg/dL.  For medical purposes only. Performed at [facility], [place] [address] [place], [place] [address]   Salicylate level     Status: Abnormal   Collection Time: 08/18/21 11:00 PM  Result Value Ref Range   Salicylate Lvl <9.2 (L) 7.0 - 30.0 mg/dL    Comment: Performed at [facility] [address], [place], [place] [address]  Acetaminophen level     Status: Abnormal   Collection Time: 08/18/21 11:00 PM  Result Value Ref Range   Acetaminophen (Tylenol), Serum <10 (L) 10 - 30 ug/mL    Comment: (NOTE) Therapeutic concentrations vary significantly. A range of 10-30 ug/mL  may be an effective concentration for many patients. However, some  are best treated at concentrations outside of this range. Acetaminophen concentrations >150 ug/mL at 4 hours after ingestion  and >50 ug/mL at 12 hours after ingestion are often associated with  toxic reactions.  Performed at [facility], [place] [address] [place], [place] [address]   Rapid urine drug screen (hospital performed)     Status: None   Collection Time: 08/19/21  5:06 AM  Result Value Ref Range   Opiates NONE DETECTED NONE DETECTED   Cocaine NONE DETECTED NONE DETECTED   Benzodiazepines NONE DETECTED NONE DETECTED   Amphetamines NONE DETECTED NONE DETECTED   Tetrahydrocannabinol NONE DETECTED NONE DETECTED   Barbiturates NONE DETECTED NONE DETECTED    Comment: (NOTE) DRUG SCREEN FOR MEDICAL PURPOSES ONLY.  IF CONFIRMATION IS NEEDED FOR ANY PURPOSE, NOTIFY LAB WITHIN 5 DAYS.  LOWEST DETECTABLE LIMITS FOR URINE DRUG SCREEN Drug Class  Cutoff (ng/mL) Amphetamine and metabolites    1000 Barbiturate and metabolites    200 Benzodiazepine                 326 Tricyclics and metabolites     300 Opiates and metabolites        300 Cocaine and metabolites        300 THC                            50 Performed at [facility], [place] [address], [place], [place] [address]      Blood Alcohol level:  Lab Results  Component Value Date   ETH 205 (H) 08/18/2021   ETH 211 (H) 80/99/8338    Metabolic Disorder Labs:  No results found for: HGBA1C, MPG No results found for: PROLACTIN No results found for: CHOL, TRIG, HDL, CHOLHDL, VLDL, LDLCALC  Current Medications: Current Facility-Administered Medications  Medication Dose Route Frequency Provider Last Rate Last Admin   alum & mag hydroxide-simeth (MAALOX/MYLANTA) 200-200-20 MG/5ML suspension 30 mL  30 mL Oral Q4H PRN Parks Ranger, DO       [START ON 08/21/2021] FLUoxetine (PROZAC) capsule 20 mg  20 mg Oral Daily Parks Ranger, DO       ibuprofen (ADVIL) tablet 600 mg  600 mg Oral Q6H PRN Parks Ranger, DO       LORazepam (ATIVAN) tablet 1 mg  1 mg Oral Q4H PRN Parks Ranger, DO   1 mg at 08/19/21 2151   magnesium hydroxide (MILK OF MAGNESIA) suspension 30 mL  30 mL Oral Daily PRN Parks Ranger, DO       potassium chloride SA (KLOR-CON M) CR tablet 20 mEq  20 mEq Oral Daily Parks Ranger, DO   20 mEq at 08/20/21 0945   QUEtiapine (SEROQUEL) tablet 200 mg  200 mg Oral QHS Parks Ranger, DO       risperiDONE (RISPERDAL) tablet 0.5 mg  0.5 mg Oral [id number] Albin Duckett Edward, DO       topiramate (TOPAMAX) tablet 50 mg  50 mg Oral BID Parks Ranger, DO   50 mg at 08/20/21 0945   traZODone (DESYREL) tablet 100 mg  100 mg Oral QHS PRN Parks Ranger, DO   100 mg at 08/19/21 2149   PTA Medications: Medications Prior to Admission  Medication Sig Dispense Refill Last Dose   DULoxetine (CYMBALTA) 30 MG capsule Take 1 capsule (30 mg total) by mouth at bedtime. (Patient not taking: Reported on 08/19/2021) 30 capsule 0    escitalopram (LEXAPRO) 20 MG tablet Take 1 tablet (20 mg total) by mouth daily. (Patient not taking: Reported on 08/19/2021) 30 tablet 0    OVER THE COUNTER MEDICATION Place 1 drop into both eyes daily as needed  (itching). Over the counter eye drops for itching eyes      topiramate (TOPAMAX) 50 MG tablet Take 1 tablet (50 mg total) by mouth 2 (two) times daily. (Patient not taking: Reported on 08/19/2021) 60 tablet 0    traZODone (DESYREL) 100 MG tablet Take 1 tablet (100 mg total) by mouth at bedtime as needed for sleep. (Patient not taking: Reported on 08/19/2021) 30 tablet 0     Musculoskeletal: Strength & Muscle Tone: within normal limits Gait & Station: normal Patient leans: N/A            Psychiatric Specialty Exam:  Presentation  General Appearance: Appropriate for Environment; Casual  Eye Contact:Good  Speech:Clear and Coherent; Normal Rate  Speech Volume:Normal  Handedness:Right   Mood and Affect  Mood:Euthymic  Affect:Appropriate; Congruent   Thought Process  Thought Processes:Coherent  Duration of Psychotic Symptoms: No data recorded Past Diagnosis of Schizophrenia or Psychoactive disorder: No  Descriptions of Associations:Intact  Orientation:Full (Time, Place and Person)  Thought Content:Logical  Hallucinations:No data recorded Ideas of Reference:None  Suicidal Thoughts:No data recorded Homicidal Thoughts:No data recorded  Sensorium  Memory:Immediate Good; Recent Good; Remote Good  Judgment:Good  Insight:Good   Executive Functions  Concentration:Good  Attention Span:Good  [place]  Language:Good   Psychomotor Activity  Psychomotor Activity:No data recorded  Assets  Assets:Communication Skills; Desire for Improvement; Financial Resources/Insurance; Housing; Physical Health; Transportation   Sleep  Sleep:No data recorded   Physical Exam: Physical Exam Constitutional:      Appearance: Normal appearance.  HENT:     Head: Normocephalic and atraumatic.     Mouth/Throat:     Pharynx: Oropharynx is clear.  Eyes:     Pupils: Pupils are equal, round, and reactive to light.  Cardiovascular:     Rate  and Rhythm: Normal rate and regular rhythm.  Pulmonary:     Effort: Pulmonary effort is normal.     Breath sounds: Normal breath sounds.  Abdominal:     General: Abdomen is flat.     Palpations: Abdomen is soft.  Musculoskeletal:        General: Normal range of motion.  Skin:    General: Skin is warm and dry.  Neurological:     General: No focal deficit present.     Mental Status: She is alert. Mental status is at baseline.  Psychiatric:        Attention and Perception: Attention and perception normal.        Mood and Affect: Mood is anxious and depressed. Affect is flat.        Speech: Speech normal.        Behavior: Behavior is cooperative.        Thought Content: Thought content normal.        Cognition and Memory: Cognition normal. She exhibits impaired recent memory.        Judgment: Judgment is impulsive.   Review of Systems  Constitutional: Negative.   HENT: Negative.    Eyes: Negative.   Respiratory: Negative.    Cardiovascular: Negative.   Gastrointestinal: Negative.   Genitourinary: Negative.   Musculoskeletal: Negative.   Skin: Negative.   Neurological: Negative.   Endo/Heme/Allergies: Negative.   Psychiatric/Behavioral:  Positive for depression, memory loss and suicidal ideas. The patient has insomnia.   Blood pressure 127/78, pulse 93, temperature 97.9 F (36.6 C), temperature source Oral, resp. rate 18, height _0  (1.626 m), weight 74.8 kg, SpO2 98 %. Body mass index is 28.32 kg/m.  Treatment Plan Summary: Daily contact with patient to assess and evaluate symptoms and progress in treatment, Medication management, and Plan she has not been on her medications for a while so we will go ahead and stop her Cymbalta and Lexapro and start Prozac 20 mg/day, Risperdal 0.5 mg twice a day, Seroquel 200 mg at bedtime.  Ativan as needed for anxiety and withdrawal.  Observation Level/Precautions:  15 minute checks  Laboratory:  HbAIC  Psychotherapy:    Medications:     Consultations:    Discharge Concerns:    Estimated LOS:  Other:     Physician Treatment  Plan for Primary Diagnosis: MDD (major depressive disorder), recurrent severe, without psychosis ([place]) Long Term Goal(s): Improvement in symptoms so as ready for discharge  Short Term Goals: Ability to identify changes in lifestyle to reduce recurrence of condition will improve, Ability to verbalize feelings will improve, Ability to disclose and discuss suicidal ideas, Ability to demonstrate self-control will improve, Ability to identify and develop effective coping behaviors will improve, Ability to maintain clinical measurements within normal limits will improve, Compliance with prescribed medications will improve, and Ability to identify triggers associated with substance abuse/mental health issues will improve  Physician Treatment Plan for Secondary Diagnosis: Principal Problem:   MDD (major depressive disorder), recurrent severe, without psychosis ([place])   I certify that inpatient services furnished can reasonably be expected to improve the patient's condition.    Parks Ranger, DO 2/14/202310:20 AM

## 2021-08-20 NOTE — BHH Counselor (Signed)
CSW made first attempt to complete PSA with pt. Pt was indisposed. CSW will follow up at another time to complete.   Henslee Lottman [place], MSW, LCSW-A 2/14/20232:48 PM

## 2021-08-20 NOTE — Progress Notes (Signed)
Patient is alert and oriented x 4, affect is flat but brightens upon approach no distress is noted she is interacting appropriately with peers and staff. Patient denies SI/HI/AVH earlier in the shift, she complained of restlessness and was medicated for anxiety as ordered. 15 minutes safety checks maintained will continue to monitor.

## 2021-08-20 NOTE — Progress Notes (Signed)
Recreation Therapy Notes    Date: 08/20/2021  Time: 1:20 pm    Location: Courtyard       Behavioral response: N/A   Intervention Topic: Social skills    Discussion/Intervention: Patient refused to attend group.   Clinical Observations/Feedback:  Patient refused to attend group.    Caitrin Pendergraph LRT/CTRS        Phil Corti 08/20/2021 2:57 PM

## 2021-08-21 LAB — HEMOGLOBIN A1C
Hgb A1c MFr Bld: 5.1 % (ref 4.8–5.6)
Mean Plasma Glucose: 99.67 mg/dL

## 2021-08-21 NOTE — Plan of Care (Signed)
Patient presents A&O x4.  Affect is sad and depressed.  Behavior is withdrawn and isolative spending most of the day in her room.  Denies anxiety, depression, AVH, SI or HI.  Patient reports sleeping good with prn medication, appetite good and energy level fair.  Patient compliant with all scheduled meds.  Ongoing Q15 minute safety check rounds per unit protocol.  Problem: Education: Goal: Utilization of techniques to improve thought processes will improve Outcome: Progressing Goal: Knowledge of the prescribed therapeutic regimen will improve Outcome: Progressing   Problem: Education: Goal: Knowledge of [facility] Education information/materials will improve 08/21/2021 0822 by Conard Novak, RN Outcome: Progressing 08/21/2021 0813 by Conard Novak, RN Outcome: Progressing Goal: Emotional status will improve 08/21/2021 0822 by Conard Nov[place], RN Outcome: Progressing 08/21/2021 0813 by Conard Nov[place], RN Outcome: Progressing Goal: Mental status will improve 08/21/2021 0822 by Conard Nov[place], RN Outcome: Progressing 08/21/2021 0813 by Conard [place], RN Outcome: Progressing   Problem: Activity: Goal: Sleeping patterns will improve 08/21/2021 0321 by Conard Novak, RN Outcome: Progressing 08/21/2021 0813 by Conard Novak, RN Outcome: Progressing   Problem: Health Behavior/Discharge Planning: Goal: Compliance with treatment plan for underlying cause of condition will improve 08/21/2021 0822 by Conard Nov[place], RN Outcome: Progressing 08/21/2021 0813 by Conard Novak, RN Outcome: Progressing

## 2021-08-21 NOTE — Plan of Care (Signed)
Patient presents A&O x4.  Affect is sad and depressed.  Behavior is withdrawn and isolative spending most of the day in her room.  Denies anxiety, depression, AVH, SI or HI.  Patient reports sleeping good with prn medication, appetite good and energy level fair.  Patient compliant with all scheduled meds.  Ongoing Q15 minute safety check rounds per unit protocol.  Problem: Education: Goal: Utilization of techniques to improve thought processes will improve Outcome: Progressing Goal: Knowledge of the prescribed therapeutic regimen will improve Outcome: Progressing   Problem: Education: Goal: Knowledge of [facility] Education information/materials will improve Outcome: Progressing Goal: Emotional status will improve Outcome: Progressing Goal: Mental status will improve Outcome: Progressing   Problem: Activity: Goal: Sleeping patterns will improve Outcome: Progressing   Problem: Health Behavior/Discharge Planning: Goal: Compliance with treatment plan for underlying cause of condition will improve Outcome: Progressing

## 2021-08-21 NOTE — Progress Notes (Signed)
[facility] MD Progress Note  08/21/2021 1:16 PM Tracie Atkins  MRN:  [id number] Subjective: Tracie Atkins states that she feels better.  Her affect is definitely improved.  She slept well last night.  She has not had any family contact recently.  She is not sure if she can go back and live with her son.  She wanted to talk to social work about a shelter.  Hopefully, we can get a hold of the son.  She still feels hopeless and helpless and may soon be homeless.  She does feel like the medicines are starting to help.  We need to get her a stable living situation otherwise she will start using drugs again.  Principal Problem: MDD (major depressive disorder), recurrent severe, without psychosis ([place]) Diagnosis: Principal Problem:   MDD (major depressive disorder), recurrent severe, without psychosis ([place])  Total Time spent with patient: 15 minutes  Past Psychiatric History: Yes  Past Medical History:  Past Medical History:  Diagnosis Date   Chronic dental infection 8/189   multiple teeth removed and Post -op infection.   Depression    ETOH abuse    Headache     Past Surgical History:  Procedure Laterality Date   DENTAL SURGERY     TUBAL LIGATION     Family History: History reviewed. No pertinent family history.  Social History:  Social History   Substance and Sexual Activity  Alcohol Use Not Currently   Alcohol/week: 2.0 standard drinks   Types: 2 Cans of beer per week     Social History   Substance and Sexual Activity  Drug Use No    Social History   Socioeconomic History   Marital status: Single    Spouse name: Not on file   Number of children: Not on file   Years of education: Not on file   Highest education level: Not on file  Occupational History   Not on file  Tobacco Use   Smoking status: Never    Passive exposure: Never   Smokeless tobacco: Never  Vaping Use   Vaping Use: Never used  Substance and Sexual Activity   Alcohol use: Not Currently    Alcohol/week: 2.0  standard drinks    Types: 2 Cans of beer per week   Drug use: No   Sexual activity: Never  Other Topics Concern   Not on file  Social History Narrative   Not on file   Social Determinants of Health   Financial Resource Strain: Not on file  Food Insecurity: Not on file  Transportation Needs: Not on file  Physical Activity: Not on file  Stress: Not on file  Social Connections: Not on file   Additional Social History:                         Sleep: Good  Appetite:  Good  Current Medications: Current Facility-Administered Medications  Medication Dose Route Frequency Provider Last Rate Last Admin   alum & mag hydroxide-simeth (MAALOX/MYLANTA) 200-200-20 MG/5ML suspension 30 mL  30 mL Oral Q4H PRN Parks Ranger, DO       FLUoxetine (PROZAC) capsule 20 mg  20 mg Oral Daily Parks Ranger, DO   20 mg at 08/21/21 0948   ibuprofen (ADVIL) tablet 600 mg  600 mg Oral Q6H PRN Parks Ranger, DO   600 mg at 08/20/21 1128   LORazepam (ATIVAN) tablet 1 mg  1 mg Oral Q4H PRN Parks Ranger, DO  1 mg at 08/19/21 2151   magnesium hydroxide (MILK OF MAGNESIA) suspension 30 mL  30 mL Oral Daily PRN Parks Ranger, DO       potassium chloride SA (KLOR-CON M) CR tablet 20 mEq  20 mEq Oral Daily Parks Ranger, DO   20 mEq at 08/21/21 9604   QUEtiapine (SEROQUEL) tablet 200 mg  200 mg Oral QHS Parks Ranger, DO   200 mg at 08/20/21 2209   risperiDONE (RISPERDAL) tablet 0.5 mg  0.5 mg Oral BH-[id number] Parks Ranger, DO   0.5 mg at 08/21/21 0753   topiramate (TOPAMAX) tablet 50 mg  50 mg Oral BID Parks Ranger, DO   50 mg at 08/21/21 5409   traZODone (DESYREL) tablet 100 mg  100 mg Oral QHS PRN Parks Ranger, DO   100 mg at 08/19/21 2149    Lab Results:  Results for orders placed or performed during the hospital encounter of 08/19/21 (from the past 48 hour(s))  Lipid panel     Status: Abnormal    Collection Time: 08/20/21 12:50 PM  Result Value Ref Range   Cholesterol 130 0 - 200 mg/dL   Triglycerides 193 (H) <150 mg/dL   HDL 36 (L) >40 mg/dL   Total CHOL/HDL Ratio 3.6 RATIO   VLDL 39 0 - 40 mg/dL   LDL Cholesterol 55 0 - 99 mg/dL    Comment:        Total Cholesterol/HDL:CHD Risk Coronary Heart Disease Risk Table                     Men   Women  1/2 Average Risk   3.4   3.3  Average Risk       5.0   4.4  2 X Average Risk   9.6   7.1  3 X Average Risk  23.4   11.0        Use the calculated Patient Ratio above and the CHD Risk Table to determine the patient's CHD Risk.        ATP III CLASSIFICATION (LDL):  <100     mg/dL   Optimal  100-129  mg/dL   Near or Above                    Optimal  130-159  mg/dL   Borderline  160-189  mg/dL   High  >190     mg/dL   Very High Performed at [facility] [place] [facility], [address] [place], [place] [address]   Hemoglobin A1c     Status: None   Collection Time: 08/21/21  7:29 AM  Result Value Ref Range   Hgb A1c MFr Bld 5.1 4.8 - 5.6 %    Comment: (NOTE) Pre diabetes:          5.7%-6.4%  Diabetes:              >6.4%  Glycemic control for   <7.0% adults with diabetes    Mean Plasma Glucose 99.67 mg/dL    Comment: Performed at [facility] [address], [place], [place] [address]    Blood Alcohol level:  Lab Results  Component Value Date   ETH 205 (H) 08/18/2021   ETH 211 (H) 56/21/3086    Metabolic Disorder Labs: Lab Results  Component Value Date   HGBA1C 5.1 08/21/2021   MPG 99.67 08/21/2021   No results found for: PROLACTIN Lab Results  Component Value Date  CHOL 130 08/20/2021   TRIG 193 (H) 08/20/2021   HDL 36 (L) 08/20/2021   CHOLHDL 3.6 08/20/2021   VLDL 39 08/20/2021   LDLCALC 55 08/20/2021    Physical Findings: AIMS:  , ,  ,  ,    CIWA:    COWS:     Musculoskeletal: Strength & Muscle Tone: within normal limits Gait & Station: normal Patient leans: N/A  Psychiatric Specialty  Exam:  Presentation  General Appearance: Appropriate for Environment; Casual  Eye Contact:Good  Speech:Clear and Coherent; Normal Rate  Speech Volume:Normal  Handedness:Right   Mood and Affect  Mood:Euthymic  Affect:Appropriate; Congruent   Thought Process  Thought Processes:Coherent  Descriptions of Associations:Intact  Orientation:Full (Time, Place and Person)  Thought Content:Logical  History of Schizophrenia/Schizoaffective disorder:No  Duration of Psychotic Symptoms:No data recorded Hallucinations:No data recorded Ideas of Reference:None  Suicidal Thoughts:No data recorded Homicidal Thoughts:No data recorded  Sensorium  Memory:Immediate Good; Recent Good; Remote Good  Judgment:Good  Insight:Good   Executive Functions  Concentration:Good  Attention Span:Good  [place]  Language:Good   Psychomotor Activity  Psychomotor Activity:No data recorded  Assets  Assets:Communication Skills; Desire for Improvement; Financial Resources/Insurance; Housing; Physical Health; Transportation   Sleep  Sleep:No data recorded   Physical Exam: Physical Exam Vitals and nursing note reviewed.  Constitutional:      Appearance: Normal appearance. She is normal weight.  Neurological:     General: No focal deficit present.     Mental Status: She is alert and oriented to person, place, and time.  Psychiatric:        Attention and Perception: Attention and perception normal.        Mood and Affect: Mood is anxious and depressed. Affect is flat.        Speech: Speech normal.        Behavior: Behavior normal. Behavior is cooperative.        Cognition and Memory: Cognition and memory normal.        Judgment: Judgment normal.   Review of Systems  Constitutional: Negative.   HENT: Negative.    Eyes: Negative.   Respiratory: Negative.    Cardiovascular: Negative.   Gastrointestinal: Negative.   Genitourinary: Negative.    Musculoskeletal: Negative.   Skin: Negative.   Neurological: Negative.   Endo/Heme/Allergies: Negative.   Psychiatric/Behavioral:  Positive for depression and suicidal ideas. The patient is nervous/anxious.   Blood pressure 121/83, pulse 98, temperature 97.8 F (36.6 C), temperature source Oral, resp. rate 20, height 5\' 4"  (1.626 m), weight 74.8 kg, SpO2 98 %. Body mass index is 28.32 kg/m.   Treatment Plan Summary: Daily contact with patient to assess and evaluate symptoms and progress in treatment, Medication management, and Plan continue current medications.  Social work to contact son.  [place], DO 08/21/2021, 1:16 PM

## 2021-08-21 NOTE — Progress Notes (Signed)
Pt in has been in bed since 7 pm , isolating to room and no interaction with staff or peers. Pt stated the still felt depressed, but she did not want to talk about it. She denies SI/HI and AVH.

## 2021-08-21 NOTE — BHH Counselor (Signed)
Adult Comprehensive Assessment  Patient ID: Tracie Atkins, female   DOB: 10/21/1955, 66 y.o.   MRN: [id number]  Information Source: Information source: Patient  Current Stressors:  Patient states their primary concerns and needs for treatment are:: "fell down stairs...was feeling depressed" Patient states their goals for this hospitilization and ongoing recovery are:: "stop being depressed, feel better" Educational / Learning stressors: Pt denies Employment / Job issues: Pt denies Family Relationships: Pt states she has some strain with her son's daughter Museum/gallery curator / Lack of resources (include bankruptcy): "it's ok" Housing / Lack of housing: Pt denies Physical health (include injuries & life threatening diseases): Pt denies Social relationships: Pt denies Substance abuse: Pt states that she uses cocaine occasionally but went to rehab then relapsed on alcohol Bereavement / Loss: Pt states that she lost her mother in '99, her boyfriend 2 years ago and her grandson 4 months ago  Living/Environment/Situation:  Living Arrangements: Children, Other (Comment) (Pt lives with her son, his two children and his girlfriend) Living conditions (as described by patient or guardian): Pt states that she does not feel comfortable there due to the amount of people in the home and that she is feeling like a burden How long has patient lived in current situation?: Pt states she has lived there since December, plans to stay as long as possible. What is atmosphere in current home: Chaotic  Family History:  Marital status: Single Are you sexually active?: No What is your sexual orientation?: unable to assess Has your sexual activity been affected by drugs, alcohol, medication, or emotional stress?: unable to assess Does patient have children?: Yes (3 sons) How many children?: 3 How is patient's relationship with their children?: Pt states that she is fairly close with 2 of her sons but feels that they are  somewhat estranged currently  Childhood History:  By whom was/is the patient raised?: Mother Additional childhood history information: "father was a low life" Description of patient's relationship with caregiver when they were a child: Pt states that she was close with her mother Patient's description of current relationship with people who raised him/her: Deceased How were you disciplined when you got in trouble as a child/adolescent?: "I was a good kid so not really" Does patient have siblings?: Yes Number of Siblings: 33 (80 sisters, 5 brothers) Description of patient's current relationship with siblings: Pt states that she is not close with her sisters but does talk to one of her brothers Did patient suffer any verbal/emotional/physical/sexual abuse as a child?: No Did patient suffer from severe childhood neglect?: No Has patient ever been sexually abused/assaulted/raped as an adolescent or adult?: No Was the patient ever a victim of a crime or a disaster?: No Witnessed domestic violence?: No Has patient been affected by domestic violence as an adult?: No  Education:  Highest grade of school patient has completed: 10th grade Currently a student?: No Learning disability?: No  Employment/Work Situation:   Employment Situation: On disability Why is Patient on Disability: "couldn't work" How Long has Patient Been on Disability: "since '99" Patient's Job has Been Impacted by Current Illness: No What is the Longest Time Patient has Held a Job?: "3 months" 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" Has Patient ever Been in the [employer]?: No  Financial Resources:   Museum/gallery curator resources: [employer], [employer], [employer], Food stamps (Pt states she is waiting for her current insurance in [place] to be transfered to [facility]) Does patient have a Programmer, applications  or guardian?: No  Alcohol/Substance Abuse:   What has been your use of drugs/alcohol within the last  12 months?: Sober for 4 months before relapsed with alcohol and "crack" cocaine use in the past If attempted suicide, did drugs/alcohol play a role in this?: Yes Alcohol/Substance Abuse Treatment Hx: Past Tx, Inpatient, Past Tx, Outpatient If yes, describe treatment: Rehab center in [place], 28 day program Has alcohol/substance abuse ever caused legal problems?: No  Social Support System:   Pensions consultant Support System: Fair Astronomer System: "my sons" Type of faith/religion: Pt denies How does patient's faith help to cope with current illness?: Pt denies  Leisure/Recreation:   Do You Have Hobbies?: Yes Leisure and Hobbies: "spend time with my daughter in law and talk with her"  Strengths/Needs:   What is the patient's perception of their strengths?: "really don't have any" Patient states they can use these personal strengths during their treatment to contribute to their recovery: Pt denies Patient states these barriers may affect/interfere with their treatment: Pt denies Patient states these barriers may affect their return to the community: Pt states she needs to find out if she can go back to her son's home Other important information patient would like considered in planning for their treatment: Pt denies  Discharge Plan:   Currently receiving community mental health services: No Patient states concerns and preferences for aftercare planning are: Pt states she is interested in outpatient therapy for her depression Patient states they will know when they are safe and ready for discharge when: "once I get on medication" Does patient have access to transportation?: Yes Does patient have financial barriers related to discharge medications?: No Will patient be returning to same living situation after discharge?:  (Pt is not certain)  Summary/Recommendations:   Summary and Recommendations (to be completed by the evaluator): Patient is a 66 year old female,  single, [place] [facility] She reports that she receives SSI and is currently unemployed.  She presents to the hospital following a fall due to intoxication and reporting suicidal ideations. She has a primary diagnosis of MDD (major depressive disorder), recurrent severe, without psychosis. Recent stressors include relocation after substance use rehab, familial strife with son whom she lives with, and no medication management facilitating depressive episode. Recommendations include: crisis stabilization, therapeutic milieu, encourage group attendance and participation, medication management for mood stabilization and development of comprehensive mental wellness plan.  Maryalice Pasley A [place]. 08/21/2021

## 2021-08-21 NOTE — BHH Suicide Risk Assessment (Addendum)
[facility] INPATIENT:  Family/Significant Other Suicide Prevention Education  Suicide Prevention Education:  Contact Attempts: [place] Ped, daughter-in-law (name of family member/significant other) has been identified by the patient as the family member/significant other with whom the patient will be residing, and identified as the person(s) who will aid the patient in the event of Tracie mental health crisis.  With written consent from the patient, two attempts were made to provide suicide prevention education, prior to and/or following the patient's discharge.  We were unsuccessful in providing suicide prevention education.  Tracie suicide education pamphlet was given to the patient to share with family/significant other.  Date and time of first attempt:08/21/21/12:21 pm Date and time of second attempt: 08/22/21 / 10:59 AM  Tracie Atkins Tracie Atkins 08/21/2021, 12:22 PM

## 2021-08-21 NOTE — BH IP Treatment Plan (Signed)
Interdisciplinary Treatment and Diagnostic Plan Update  08/21/2021 Time of Session: 2:00PM Francely Craw MRN: [id number]  Principal Diagnosis: MDD (major depressive disorder), recurrent severe, without psychosis ([place])  Secondary Diagnoses: Principal Problem:   MDD (major depressive disorder), recurrent severe, without psychosis ([place])   Current Medications:  Current Facility-Administered Medications  Medication Dose Route Frequency Provider Last Rate Last Admin   alum & mag hydroxide-simeth (MAALOX/MYLANTA) 200-200-20 MG/5ML suspension 30 mL  30 mL Oral Q4H PRN Parks Ranger, DO       FLUoxetine (PROZAC) capsule 20 mg  20 mg Oral Daily Parks Ranger, DO   20 mg at 08/21/21 0948   ibuprofen (ADVIL) tablet 600 mg  600 mg Oral Q6H PRN Parks Ranger, DO   600 mg at 08/20/21 1128   LORazepam (ATIVAN) tablet 1 mg  1 mg Oral Q4H PRN Parks Ranger, DO   1 mg at 08/19/21 2151   magnesium hydroxide (MILK OF MAGNESIA) suspension 30 mL  30 mL Oral Daily PRN Parks Ranger, DO       potassium chloride SA (KLOR-CON M) CR tablet 20 mEq  20 mEq Oral Daily Parks Ranger, DO   20 mEq at 08/21/21 0948   QUEtiapine (SEROQUEL) tablet 200 mg  200 mg Oral QHS Parks Ranger, DO   200 mg at 08/20/21 2209   risperiDONE (RISPERDAL) tablet 0.5 mg  0.5 mg Oral BH-q8a4p Parks Ranger, DO   0.5 mg at 08/21/21 0753   topiramate (TOPAMAX) tablet 50 mg  50 mg Oral BID Parks Ranger, DO   50 mg at 08/21/21 0949   traZODone (DESYREL) tablet 100 mg  100 mg Oral QHS PRN Parks Ranger, DO   100 mg at 08/19/21 2149   PTA Medications: Medications Prior to Admission  Medication Sig Dispense Refill Last Dose   DULoxetine (CYMBALTA) 30 MG capsule Take 1 capsule (30 mg total) by mouth at bedtime. (Patient not taking: Reported on 08/19/2021) 30 capsule 0    escitalopram (LEXAPRO) 20 MG tablet Take 1 tablet (20 mg total) by mouth daily.  (Patient not taking: Reported on 08/19/2021) 30 tablet 0    OVER THE COUNTER MEDICATION Place 1 drop into both eyes daily as needed (itching). Over the counter eye drops for itching eyes      topiramate (TOPAMAX) 50 MG tablet Take 1 tablet (50 mg total) by mouth 2 (two) times daily. (Patient not taking: Reported on 08/19/2021) 60 tablet 0    traZODone (DESYREL) 100 MG tablet Take 1 tablet (100 mg total) by mouth at bedtime as needed for sleep. (Patient not taking: Reported on 08/19/2021) 30 tablet 0     Patient Stressors:    Patient Strengths:    Treatment Modalities: Medication Management, Group therapy, Case management,  1 to 1 session with clinician, Psychoeducation, Recreational therapy.   Physician Treatment Plan for Primary Diagnosis: MDD (major depressive disorder), recurrent severe, without psychosis ([place]) Long Term Goal(s): Improvement in symptoms so as ready for discharge   Short Term Goals: Ability to identify changes in lifestyle to reduce recurrence of condition will improve Ability to verbalize feelings will improve Ability to disclose and discuss suicidal ideas Ability to demonstrate self-control will improve Ability to identify and develop effective coping behaviors will improve Ability to maintain clinical measurements within normal limits will improve Compliance with prescribed medications will improve Ability to identify triggers associated with substance abuse/mental health issues will improve  Medication Management: Evaluate patient's response, side  effects, and tolerance of medication regimen.  Therapeutic Interventions: 1 to 1 sessions, Unit Group sessions and Medication administration.  Evaluation of Outcomes: Progressing  Physician Treatment Plan for Secondary Diagnosis: Principal Problem:   MDD (major depressive disorder), recurrent severe, without psychosis ([place])  Long Term Goal(s): Improvement in symptoms so as ready for discharge   Short Term Goals:  Ability to identify changes in lifestyle to reduce recurrence of condition will improve Ability to verbalize feelings will improve Ability to disclose and discuss suicidal ideas Ability to demonstrate self-control will improve Ability to identify and develop effective coping behaviors will improve Ability to maintain clinical measurements within normal limits will improve Compliance with prescribed medications will improve Ability to identify triggers associated with substance abuse/mental health issues will improve     Medication Management: Evaluate patient's response, side effects, and tolerance of medication regimen.  Therapeutic Interventions: 1 to 1 sessions, Unit Group sessions and Medication administration.  Evaluation of Outcomes: Progressing   RN Treatment Plan for Primary Diagnosis: MDD (major depressive disorder), recurrent severe, without psychosis ([place]) Long Term Goal(s): Knowledge of disease and therapeutic regimen to maintain health will improve  Short Term Goals: Ability to remain free from injury will improve, Ability to verbalize frustration and anger appropriately will improve, Ability to demonstrate self-control, Ability to participate in decision making will improve, Ability to verbalize feelings will improve, Ability to disclose and discuss suicidal ideas, Ability to identify and develop effective coping behaviors will improve, and Compliance with prescribed medications will improve  Medication Management: RN will administer medications as ordered by provider, will assess and evaluate patient's response and provide education to patient for prescribed medication. RN will report any adverse and/or side effects to prescribing provider.  Therapeutic Interventions: 1 on 1 counseling sessions, Psychoeducation, Medication administration, Evaluate responses to treatment, Monitor vital signs and CBGs as ordered, Perform/monitor CIWA, COWS, AIMS and Fall Risk screenings as  ordered, Perform wound care treatments as ordered.  Evaluation of Outcomes: Progressing   LCSW Treatment Plan for Primary Diagnosis: MDD (major depressive disorder), recurrent severe, without psychosis ([place]) Long Term Goal(s): Safe transition to appropriate next level of care at discharge, Engage patient in therapeutic group addressing interpersonal concerns.  Short Term Goals: Engage patient in aftercare planning with referrals and resources, Increase social support, Increase ability to appropriately verbalize feelings, Increase emotional regulation, Facilitate acceptance of mental health diagnosis and concerns, Identify triggers associated with mental health/substance abuse issues, and Increase skills for wellness and recovery  Therapeutic Interventions: Assess for all discharge needs, 1 to 1 time with Social worker, Explore available resources and support systems, Assess for adequacy in community support network, Educate family and significant other(s) on suicide prevention, Complete Psychosocial Assessment, Interpersonal group therapy.  Evaluation of Outcomes: Progressing   Progress in Treatment: Attending groups: No. Participating in groups: No. Taking medication as prescribed: Yes. Toleration medication: Yes. Family/Significant other contact made: No, will contact:  when given permission Patient understands diagnosis: Yes. Discussing patient identified problems/goals with staff: Yes. Medical problems stabilized or resolved: Yes. Denies suicidal/homicidal ideation: Yes. Issues/concerns per patient self-inventory: No. Other: None  New problem(s) identified: No, Describe:  none  New Short Term/Long Term Goal(s): Patient to work towards detox, medication management for mood stabilization; elimination of SI thoughts; development of comprehensive mental wellness/sobriety plan.  Patient Goals:  "get on medications, feel better"  Discharge Plan or Barriers: CSW will assist pt with  development of an appropriate discharge/aftercare plan.   Reason for Continuation of Hospitalization: Anxiety  Depression Medication stabilization  Estimated Length of Stay: TBD   Scribe for Treatment Team: Amandine Covino A [place], LCSWA 08/21/2021 2:37 PM

## 2021-08-21 NOTE — Progress Notes (Signed)
Recreation Therapy Notes   Date: 08/21/2021  Time: 1:15pm   Location: Craft room  Behavioral response: N/A   Intervention Topic: Self-Care    Discussion/Intervention: Patient refused to attend group.   Clinical Observations/Feedback:  Patient refused to attend group.    Daelynn Blower LRT/CTRS        Teara Duerksen 08/21/2021 2:28 PM

## 2021-08-22 NOTE — Progress Notes (Signed)
Recreation Therapy Notes  Date: 08/22/2021  Time: 1:30 pm   Location:  [facility]    Behavioral response: Appropriate  Intervention Topic:  Communications   Discussion/Intervention:  Group content today was focused on communication. The group defined communication and ways to communicate with others. Individuals stated reason why communication is important and some reasons to communicate with others. Patients expressed if they thought they were good at communicating with others and ways they could improve their communication skills. The group identified important parts of communication and some experiences they have had in the past with communication. The group participated in the intervention where they had a chance to test out their communication skills and identify ways to improve their communication techniques.  Clinical Observations/Feedback: Patient came to group and was able to focus and engage on the topic at hand. Individual was social with peers and staff while participating in the intervention.  Patience Nuzzo LRT/CTRS         Dalisa Forrer 08/22/2021 3:17 PM

## 2021-08-22 NOTE — Plan of Care (Signed)
°  Problem: Education: Goal: Knowledge of the prescribed therapeutic regimen will improve Outcome: Progressing   Problem: Activity: Goal: Interest or engagement in leisure activities will improve Outcome: Progressing   Problem: Safety: Goal: Ability to disclose and discuss suicidal ideas will improve Outcome: Progressing

## 2021-08-22 NOTE — Progress Notes (Signed)
Recreation Therapy Notes  INPATIENT RECREATION THERAPY ASSESSMENT  Patient Details Name: Tracie Atkins MRN: [id number] DOB: 06/14/1956 Today's Date: 08/22/2021       Information Obtained From: Patient  Able to Participate in Assessment/Interview: Yes  Patient Presentation: Responsive  Reason for Admission (Per Patient): Active Symptoms  Patient Stressors:    Coping Skills:   Talk  Leisure Interests (2+):  Social - Family, Individual - TV  Frequency of Recreation/Participation: Weekly  Awareness of Community Resources:  No  Community Resources:     Current Use:    If no, Barriers?:    Expressed Interest in [employer] Information: Yes  County of Residence:  [place]  Patient Main Form of Transportation: Car  Patient Strengths:  N/A  Patient Identified Areas of Improvement:  N/A  Patient Goal for Hospitalization:  Feeling better  Current SI (including self-harm):  No  Current HI:  No  Current AVH: No  Staff Intervention Plan: Group Attendance, Collaborate with Interdisciplinary Treatment Team  Consent to Intern Participation: N/A  Chaynce Schafer 08/22/2021, 3:27 PM

## 2021-08-22 NOTE — Progress Notes (Signed)
Recreation Therapy Notes  INPATIENT RECREATION TR PLAN  Patient Details Name: Tracie Atkins MRN: [id number] DOB: Jan 02, 1956 Today's Date: 08/22/2021  Rec Therapy Plan Is patient appropriate for Therapeutic Recreation?: Yes Treatment times per week: at least 3 Estimated Length of Stay: 5-7 days TR Treatment/Interventions: Group participation (Comment)  Discharge Criteria Pt will be discharged from therapy if:: Discharged Treatment plan/goals/alternatives discussed and agreed upon by:: Patient/family  Discharge Summary     Mrk Buzby 08/22/2021, 3:28 PM

## 2021-08-22 NOTE — Progress Notes (Signed)
No distress noted, affect is flat thoughts are organized and coherent, she denies SI/HI/AVH interacting appropriately with peers and staff, complaint with medication regimen, 15 minutes safety checks maintained will continue to monitor.

## 2021-08-22 NOTE — Progress Notes (Incomplete)
Patient alert and oriented x4, calm and cooperative. Patient states that she slept well last night. Patient denies SI,HI,AVH, depression, anxiety and pain. Patient ate breakfast in day room, interacts with other peers and staff appropriately. Medications administered as ordered. Q 15 minutes safety checks maintained.

## 2021-08-22 NOTE — Progress Notes (Signed)
[facility] MD Progress Note  08/22/2021 10:24 AM Tracie Atkins  MRN:  [id number] Subjective: Tracie Atkins states that she is feeling much better.  Her affect is much improved.  She says that she spoke with her son and he said that she could continue to live with him.  She is no longer having any suicidal thoughts.  Social work is working on follow-up for her hopefully we can get her out tomorrow.  Principal Problem: MDD (major depressive disorder), recurrent severe, without psychosis ([place]) Diagnosis: Principal Problem:   MDD (major depressive disorder), recurrent severe, without psychosis ([place])  Total Time spent with patient: 15 minutes  Past Psychiatric History: yes  Past Medical History:  Past Medical History:  Diagnosis Date   Chronic dental infection 8/189   multiple teeth removed and Post -op infection.   Depression    ETOH abuse    Headache     Past Surgical History:  Procedure Laterality Date   DENTAL SURGERY     TUBAL LIGATION     Family History: History reviewed. No pertinent family history.  Social History:  Social History   Substance and Sexual Activity  Alcohol Use Not Currently   Alcohol/week: 2.0 standard drinks   Types: 2 Cans of beer per week     Social History   Substance and Sexual Activity  Drug Use No    Social History   Socioeconomic History   Marital status: Single    Spouse name: Not on file   Number of children: Not on file   Years of education: Not on file   Highest education level: Not on file  Occupational History   Not on file  Tobacco Use   Smoking status: Never    Passive exposure: Never   Smokeless tobacco: Never  Vaping Use   Vaping Use: Never used  Substance and Sexual Activity   Alcohol use: Not Currently    Alcohol/week: 2.0 standard drinks    Types: 2 Cans of beer per week   Drug use: No   Sexual activity: Never  Other Topics Concern   Not on file  Social History Narrative   Not on file   Social Determinants of Health    Financial Resource Strain: Not on file  Food Insecurity: Not on file  Transportation Needs: Not on file  Physical Activity: Not on file  Stress: Not on file  Social Connections: Not on file   Additional Social History:                         Sleep: Good  Appetite:  Good  Current Medications: Current Facility-Administered Medications  Medication Dose Route Frequency Provider Last Rate Last Admin   alum & mag hydroxide-simeth (MAALOX/MYLANTA) 200-200-20 MG/5ML suspension 30 mL  30 mL Oral Q4H PRN Parks Ranger, DO       FLUoxetine (PROZAC) capsule 20 mg  20 mg Oral Daily Parks Ranger, DO   20 mg at 08/22/21 0901   ibuprofen (ADVIL) tablet 600 mg  600 mg Oral Q6H PRN Parks Ranger, DO   600 mg at 08/21/21 1653   LORazepam (ATIVAN) tablet 1 mg  1 mg Oral Q4H PRN Parks Ranger, DO   1 mg at 08/19/21 2151   magnesium hydroxide (MILK OF MAGNESIA) suspension 30 mL  30 mL Oral Daily PRN Parks Ranger, DO       potassium chloride SA (KLOR-CON M) CR tablet 20 mEq  20 mEq  Oral Daily Parks Ranger, DO   20 mEq at 08/22/21 0901   QUEtiapine (SEROQUEL) tablet 200 mg  200 mg Oral QHS Parks Ranger, DO   200 mg at 08/21/21 2120   risperiDONE (RISPERDAL) tablet 0.5 mg  0.5 mg Oral [id number] Parks Ranger, DO   0.5 mg at 08/22/21 0900   topiramate (TOPAMAX) tablet 50 mg  50 mg Oral BID Parks Ranger, DO   50 mg at 08/22/21 0900   traZODone (DESYREL) tablet 100 mg  100 mg Oral QHS PRN Parks Ranger, DO   100 mg at 08/21/21 2120    Lab Results:  Results for orders placed or performed during the hospital encounter of 08/19/21 (from the past 48 hour(s))  Lipid panel     Status: Abnormal   Collection Time: 08/20/21 12:50 PM  Result Value Ref Range   Cholesterol 130 0 - 200 mg/dL   Triglycerides 193 (H) <150 mg/dL   HDL 36 (L) >40 mg/dL   Total CHOL/HDL Ratio 3.6 RATIO   VLDL 39 0 - 40  mg/dL   LDL Cholesterol 55 0 - 99 mg/dL    Comment:        Total Cholesterol/HDL:CHD Risk Coronary Heart Disease Risk Table                     Men   Women  1/2 Average Risk   3.4   3.3  Average Risk       5.0   4.4  2 X Average Risk   9.6   7.1  3 X Average Risk  23.4   11.0        Use the calculated Patient Ratio above and the CHD Risk Table to determine the patient's CHD Risk.        ATP III CLASSIFICATION (LDL):  <100     mg/dL   Optimal  100-129  mg/dL   Near or Above                    Optimal  130-159  mg/dL   Borderline  160-189  mg/dL   High  >190     mg/dL   Very High Performed at [facility], [address] [place], [place] [address]   Hemoglobin A1c     Status: None   Collection Time: 08/21/21  7:29 AM  Result Value Ref Range   Hgb A1c MFr Bld 5.1 4.8 - 5.6 %    Comment: (NOTE) Pre diabetes:          5.7%-6.4%  Diabetes:              >6.4%  Glycemic control for   <7.0% adults with diabetes    Mean Plasma Glucose 99.67 mg/dL    Comment: Performed at [facility] [address] [place], [place] [address]    Blood Alcohol level:  Lab Results  Component Value Date   ETH 205 (H) 08/18/2021   ETH 211 (H) 53/29/9242    Metabolic Disorder Labs: Lab Results  Component Value Date   HGBA1C 5.1 08/21/2021   MPG 99.67 08/21/2021   No results found for: PROLACTIN Lab Results  Component Value Date   CHOL 130 08/20/2021   TRIG 193 (H) 08/20/2021   HDL 36 (L) 08/20/2021   CHOLHDL 3.6 08/20/2021   VLDL 39 08/20/2021   LDLCALC 55 08/20/2021    Physical Findings: AIMS:  , ,  ,  ,  CIWA:    COWS:     Musculoskeletal: Strength & Muscle Tone: within normal limits Gait & Station: normal Patient leans: N/A  Psychiatric Specialty Exam:  Presentation  General Appearance: Appropriate for Environment; Casual  Eye Contact:Good  Speech:Clear and Coherent; Normal Rate  Speech Volume:Normal  Handedness:Right   Mood and Affect   Mood:Euthymic  Affect:Appropriate; Congruent   Thought Process  Thought Processes:Coherent  Descriptions of Associations:Intact  Orientation:Full (Time, Place and Person)  Thought Content:Logical  History of Schizophrenia/Schizoaffective disorder:No  Duration of Psychotic Symptoms:No data recorded Hallucinations:No data recorded Ideas of Reference:None  Suicidal Thoughts:No data recorded Homicidal Thoughts:No data recorded  Sensorium  Memory:Immediate Good; Recent Good; Remote Good  Judgment:Good  Insight:Good   Executive Functions  Concentration:Good  Attention Span:Good  [place]  Language:Good   Psychomotor Activity  Psychomotor Activity:No data recorded  Assets  Assets:Communication Skills; Desire for Improvement; Financial Resources/Insurance; Housing; Physical Health; Transportation   Sleep  Sleep:No data recorded   Physical Exam: Physical Exam Vitals and nursing note reviewed.  Constitutional:      Appearance: Normal appearance. She is normal weight.  Neurological:     General: No focal deficit present.     Mental Status: She is alert and oriented to person, place, and time.  Psychiatric:        Attention and Perception: Attention and perception normal.        Mood and Affect: Mood and affect normal.        Speech: Speech normal.        Behavior: Behavior normal. Behavior is cooperative.        Thought Content: Thought content normal.        Cognition and Memory: Cognition and memory normal.        Judgment: Judgment normal.   Review of Systems  Constitutional: Negative.   HENT: Negative.    Eyes: Negative.   Respiratory: Negative.    Cardiovascular: Negative.   Gastrointestinal: Negative.   Genitourinary: Negative.   Musculoskeletal: Negative.   Skin: Negative.   Neurological: Negative.   Endo/Heme/Allergies: Negative.   Psychiatric/Behavioral:  Positive for depression.   Blood pressure 112/71,  pulse 80, temperature 98 F (36.7 C), temperature source Oral, resp. rate 20, height 5\' 4"  (1.626 m), weight 74.8 kg, SpO2 98 %. Body mass index is 28.32 kg/m.   Treatment Plan Summary: Daily contact with patient to assess and evaluate symptoms and progress in treatment, Medication management, and Plan continue current medications.  Social work working on follow-up for her in [place].  Hopeful for discharge tomorrow.  Parks Ranger, DO 08/22/2021, 10:24 AM

## 2021-08-22 NOTE — Plan of Care (Signed)
Patient alert and oriented x4, calm and cooperative. Patient states that she slept well last night. Patient denies SI,HI,AVH, depression, anxiety and pain. Patient ate breakfast in day room, interacts with other peers and staff appropriately. Medications administered as ordered. Q 15 minutes safety checks maintained.  Problem: Education: Goal: Utilization of techniques to improve thought processes will improve Outcome: Progressing Goal: Knowledge of the prescribed therapeutic regimen will improve Outcome: Progressing   Problem: Activity: Goal: Interest or engagement in leisure activities will improve Outcome: Progressing Goal: Imbalance in normal sleep/wake cycle will improve Outcome: Progressing   Problem: Safety: Goal: Ability to disclose and discuss suicidal ideas will improve Outcome: Progressing Goal: Ability to identify and utilize support systems that promote safety will improve Outcome: Progressing   Problem: Education: Goal: Knowledge of [place] [facility] Education information/materials will improve Outcome: Progressing Goal: Emotional status will improve Outcome: Progressing Goal: Mental status will improve Outcome: Progressing Goal: Verbalization of understanding the information provided will improve Outcome: Progressing   Problem: Activity: Goal: Interest or engagement in activities will improve Outcome: Progressing Goal: Sleeping patterns will improve Outcome: Progressing   Problem: Coping: Goal: Ability to verbalize frustrations and anger appropriately will improve Outcome: Progressing Goal: Ability to demonstrate self-control will improve Outcome: Progressing   Problem: Health Behavior/Discharge Planning: Goal: Identification of resources available to assist in meeting health care needs will improve Outcome: Progressing Goal: Compliance with treatment plan for underlying cause of condition will improve Outcome: Progressing

## 2021-08-22 NOTE — Group Note (Signed)
LCSW Group Therapy Note  Group Date: 08/22/2021 Start Time: 1400 End Time: 1430   Type of Therapy and Topic:  Group Therapy - Healthy vs Unhealthy Coping Skills  Participation Level:  Active   Description of Group The focus of this group was to determine what unhealthy coping techniques typically are used by group members and what healthy coping techniques would be helpful in coping with various problems. Patients were guided in becoming aware of the differences between healthy and unhealthy coping techniques. Patients were asked to identify 2-3 healthy coping skills they would like to learn to use more effectively.  Therapeutic Goals Patients learned that coping is what human beings do all day long to deal with various situations in their lives Patients defined and discussed healthy vs unhealthy coping techniques Patients identified their preferred coping techniques and identified whether these were healthy or unhealthy Patients determined 2-3 healthy coping skills they would like to become more familiar with and use more often. Patients provided support and ideas to each other   Summary of Patient Progress:   Patient was present for the entirety of the session. Patient was an active listener and participated in the topic of discussion, provided helpful advice to others, and added nuance to topic of conversation. CSW spoke with pt individually. CSW accompanied group outside and demonstrated coping skills through outdoor recreation. She stated that she was feeling better today because she found out her son was letting her go back home. She said she was somewhat down due to it being the anniversary of her grandson's death. She said she was anxious to go home and participate in supporting the family during this hard time.    Therapeutic Modalities Cognitive Behavioral Therapy Motivational Interviewing  Markail Diekman A [place], LCSWA 08/22/2021  3:50 PM

## 2021-08-23 MED ORDER — TOPIRAMATE 50 MG PO TABS: 50.0000 mg | ORAL_TABLET | Freq: Two times a day (BID) | ORAL | 2 refills | Status: DC

## 2021-08-23 MED ORDER — FLUOXETINE HCL 20 MG PO CAPS: 20.0000 mg | ORAL_CAPSULE | Freq: Every day | ORAL | 2 refills | Status: DC

## 2021-08-23 MED ORDER — TRAZODONE HCL 100 MG PO TABS: 100.0000 mg | ORAL_TABLET | Freq: Every evening | ORAL | 2 refills | Status: DC | PRN

## 2021-08-23 MED ORDER — RISPERIDONE 0.5 MG PO TABS: 0.5000 mg | ORAL_TABLET | ORAL | 2 refills | Status: DC

## 2021-08-23 MED ORDER — QUETIAPINE FUMARATE 200 MG PO TABS: 200.0000 mg | ORAL_TABLET | Freq: Every day | ORAL | 2 refills | Status: DC

## 2021-08-23 NOTE — Progress Notes (Signed)
Patient alert and oriented x4, calm and cooperative. Patient states that she slept well last night. Patient denies SI,HI,AVH, depression, anxiety and pain. Patient ate breakfast in day room, interacts with other peers and staff appropriately. Patient verbally contrasts for safety. Medications administered as ordered. Q 15 minutes safety checks maintained.

## 2021-08-23 NOTE — Discharge Summary (Signed)
Physician Discharge Summary Note  Patient:  Tracie Atkins is an 66 y.o., female MRN:  [id number] DOB:  07-Feb-1956 Patient phone:  [phone] (home)  Patient address:   [place] [address],  Total Time spent with patient: 1 hour  Date of Admission:  08/19/2021 Date of Discharge: 08/23/2021  Reason for Admission:  66 year old female fell after drinking two glasses of wine with minor head injury and negative CT scan of head.  She endorsed suicidal ideation.  Principal Problem: MDD (major depressive disorder), recurrent severe, without psychosis ([place]) Discharge Diagnoses: Principal Problem:   MDD (major depressive disorder), recurrent severe, without psychosis ([place])   Past Psychiatric History: One hospitalization in [place] 3 years ago for SI  Past Medical History:  Past Medical History:  Diagnosis Date   Chronic dental infection 8/189   multiple teeth removed and Post -op infection.   Depression    ETOH abuse    Headache     Past Surgical History:  Procedure Laterality Date   DENTAL SURGERY     TUBAL LIGATION     Family History: History reviewed. No pertinent family history. Family Psychiatric  History: Unremarkable Social History:  Social History   Substance and Sexual Activity  Alcohol Use Not Currently   Alcohol/week: 2.0 standard drinks   Types: 2 Cans of beer per week     Social History   Substance and Sexual Activity  Drug Use No    Social History   Socioeconomic History   Marital status: Single    Spouse name: Not on file   Number of children: Not on file   Years of education: Not on file   Highest education level: Not on file  Occupational History   Not on file  Tobacco Use   Smoking status: Never    Passive exposure: Never   Smokeless tobacco: Never  Vaping Use   Vaping Use: Never used  Substance and Sexual Activity   Alcohol use: Not Currently    Alcohol/week: 2.0 standard drinks    Types: 2 Cans of beer per week   Drug use:  No   Sexual activity: Never  Other Topics Concern   Not on file  Social History Narrative   Not on file   Social Determinants of Health   Financial Resource Strain: Not on file  Food Insecurity: Not on file  Transportation Needs: Not on file  Physical Activity: Not on file  Stress: Not on file  Social Connections: Not on file    Hospital Course: Valyn was admitted voluntarily to geriatric psychiatry.  She recently moved from [place] to be with her son in [place] this past December.  She ran out of her medications and relapsed on alcohol.  She blacked out and fell and hit her head.  She was transferred to [facility].  She was admitted under routine orders and precautions and her medications were restarted and some of them were changed.  She did well on the changes in her mood and affect improved.  She was pleasant and cooperative on the unit and participated in group therapy.  It was felt that she maximized hospitalization she was discharged home.  On the day of discharge she denied suicidal ideation homicidal ideation auditory or visual hallucinations.  Her judgment and insight were good.  Hemoglobin A1c was 5.1 and lipid panel was within normal limits.  CBC and CMP were within normal limits.  CT of the head was normal.  Physical  Findings: AIMS:  , ,  ,  ,    CIWA:    COWS:     Musculoskeletal: Strength & Muscle Tone: within normal limits Gait & Station: normal Patient leans: N/A   Psychiatric Specialty Exam:  Presentation  General Appearance: Appropriate for Environment; Casual  Eye Contact:Good  Speech:Clear and Coherent; Normal Rate  Speech Volume:Normal  Handedness:Right   Mood and Affect  Mood:Euthymic  Affect:Appropriate; Congruent   Thought Process  Thought Processes:Coherent  Descriptions of Associations:Intact  Orientation:Full (Time, Place and Person)  Thought Content:Logical  History of Schizophrenia/Schizoaffective disorder:No  Duration of  Psychotic Symptoms:No data recorded Hallucinations:No data recorded Ideas of Reference:None  Suicidal Thoughts:No data recorded Homicidal Thoughts:No data recorded  Sensorium  Memory:Immediate Good; Recent Good; Remote Good  Judgment:Good  Insight:Good   Executive Functions  Concentration:Good  Attention Span:Good  [place]  Language:Good   Psychomotor Activity  Psychomotor Activity:No data recorded  Assets  Assets:Communication Skills; Desire for Improvement; Financial Resources/Insurance; Housing; Physical Health; Transportation   Sleep  Sleep:No data recorded   Physical Exam: Physical Exam Vitals and nursing note reviewed.  Constitutional:      Appearance: Normal appearance. She is normal weight.  Neurological:     General: No focal deficit present.     Mental Status: She is alert and oriented to person, place, and time.  Psychiatric:        Attention and Perception: Attention and perception normal.        Mood and Affect: Mood and affect normal.        Speech: Speech normal.        Behavior: Behavior normal. Behavior is cooperative.        Thought Content: Thought content normal.        Cognition and Memory: Cognition and memory normal.        Judgment: Judgment normal.   Review of Systems  Constitutional: Negative.   HENT: Negative.    Eyes: Negative.   Respiratory: Negative.    Cardiovascular: Negative.   Gastrointestinal: Negative.   Genitourinary: Negative.   Musculoskeletal: Negative.   Skin: Negative.   Neurological: Negative.   Endo/Heme/Allergies: Negative.   Psychiatric/Behavioral: Negative.    Blood pressure 109/73, pulse 85, temperature 97.9 F (36.6 C), temperature source Oral, resp. rate 18, height 5\' 4"  (1.626 m), weight 74.8 kg, SpO2 97 %. Body mass index is 28.32 kg/m.   Social History   Tobacco Use  Smoking Status Never   Passive exposure: Never  Smokeless Tobacco Never   Tobacco Cessation:   N/A, patient does not currently use tobacco products   Blood Alcohol level:  Lab Results  Component Value Date   ETH 205 (H) 08/18/2021   ETH 211 (H) 01/60/1093    Metabolic Disorder Labs:  Lab Results  Component Value Date   HGBA1C 5.1 08/21/2021   MPG 99.67 08/21/2021   No results found for: PROLACTIN Lab Results  Component Value Date   CHOL 130 08/20/2021   TRIG 193 (H) 08/20/2021   HDL 36 (L) 08/20/2021   CHOLHDL 3.6 08/20/2021   VLDL 39 08/20/2021   LDLCALC 55 08/20/2021    See Psychiatric Specialty Exam and Suicide Risk Assessment completed by Attending Physician prior to discharge.  Discharge destination:  Home  Is patient on multiple antipsychotic therapies at discharge:  No   Has Patient had three or more failed trials of antipsychotic monotherapy by history:  No  Recommended Plan for Multiple Antipsychotic Therapies: Additional reason(s)  for multiple antispychotic treatment:  Depression, negative thoughts and insomnia   Allergies as of 08/23/2021   No Known Allergies      Medication List     STOP taking these medications    DULoxetine 30 MG capsule Commonly known as: Cymbalta   escitalopram 20 MG tablet Commonly known as: Lexapro       TAKE these medications      Indication  FLUoxetine 20 MG capsule Commonly known as: PROZAC Take 1 capsule (20 mg total) by mouth daily. Start taking on: August 24, 2021  Indication: Depression   OVER THE COUNTER MEDICATION Place 1 drop into both eyes daily as needed (itching). Over the counter eye drops for itching eyes    QUEtiapine 200 MG tablet Commonly known as: SEROQUEL Take 1 tablet (200 mg total) by mouth at bedtime.  Indication: Major Depressive Disorder   risperiDONE 0.5 MG tablet Commonly known as: RISPERDAL Take 1 tablet (0.5 mg total) by mouth 2 (two) times daily at 8 am and 4 pm.  Indication: Agitated Movements Accompanied by Emotional Distress   topiramate 50 MG tablet Commonly  known as: Topamax Take 1 tablet (50 mg total) by mouth 2 (two) times daily.  Indication: Abuse or Misuse of Alcohol   traZODone 100 MG tablet Commonly known as: DESYREL Take 1 tablet (100 mg total) by mouth at bedtime as needed for sleep.  Indication: [facility] Follow up.   Why: Your referral has been sent. The facilitiy will contact you to schedule your appointment. Thanks!  Name update: [facility]. Address: [address], [address] [place] [address] Contact information: [place] [address] [phone]                 Follow-up recommendations:  [facility]   Signed: Parks Ranger, DO 08/23/2021, 11:29 AM

## 2021-08-23 NOTE — Progress Notes (Signed)
Patient pleasant and cooperative. Isolative to self. Noted in dayroom with minimal interaction with peers. Eating snacks in dayroom.Complaints of headache, prn given with good relief. No other complaints voiced. Denies SI, HI, AVH. Medication compliant, appropriate with staff and peers. Encouragement and support provided. Safety checks maintained. Medications given as prescribed. Pt receptive and remains safe on unit with q 15 min checks.

## 2021-08-23 NOTE — Progress Notes (Signed)
Recreation Therapy Notes  Date: 08/23/2021  Time: 1:30 pm   Location: Quiet room   Behavioral response: Appropriate  Intervention Topic:  Coping Skills    Discussion/Intervention:  Group content on today was focused on coping skills. The group defined what coping skills are and when they normally use coping skills. Individuals described how they normally cope with thing and the coping skills they normally use. Patients expressed why it is important to cope with things and how not coping with things can affect you. The group participated in the intervention My coping box and made coping boxes while adding coping skills they could use in the future to the box. Clinical Observations/Feedback: Patient came to group late and identified walking, talking to her brother and looking at books as coping skills she participates in. Individual was social with peers and staff while participating in the intervention.   Deuntae Kocsis LRT/CTRS         Tydarius Yawn 08/23/2021 2:53 PM

## 2021-08-23 NOTE — Progress Notes (Signed)
°  [facility] Adult Case Management Discharge Plan :  Will you be returning to the same living situation after discharge:  Yes,  pt will be returning to her son's home At discharge, do you have transportation home?: Yes,  CSW will assist pt in arranging transportation home Do you have the ability to pay for your medications: Yes,  pt has [facility] Medicare  Release of information consent forms completed and in the chart;  Patient's signature needed at discharge.  Patient to Follow up at:  [facility]-up [facility] Follow up on 09/18/2021.   Why: You have an appointment scheduled for Wednesday March 15th at 1pm. (in person) Thanks!  Name update: [facility]. Address: [address], [address] [place] [address] Contact information: [place] [address] [phone]         [facility] Follow up.   Specialty: Urgent Care Why: Here is an additional resource as needed. You can schedule a walk-in appointment for medication management or therapy. Walk-in hours are Monday through Wednesday 7:45am-11am. Thanks! Contact information: [place] [phone]                Next level of care provider has access to [place] and Suicide Prevention discussed: Yes,  completed with pt     Has patient been referred to the Quitline?: N/A patient is not a smoker  Patient has been referred for addiction treatment: Pt. refused referral  Perlie Stene A [place], [place] 08/23/2021, 12:57 PM

## 2021-08-23 NOTE — Care Management Important Message (Signed)
Important Message  Patient Details  Name: Tracie Atkins MRN: [id number] Date of Birth: 1956-05-25   Medicare Important Message Given:  Yes     Koreen Lizaola A [place], [place] 08/23/2021, 11:22 AM

## 2021-08-23 NOTE — Plan of Care (Signed)
Problem: Group Participation Goal: STG - Patient will engage in groups without prompting or encouragement from LRT x3 group sessions within 5 recreation therapy group sessions Description: STG - Patient will engage in groups without prompting or encouragement from LRT x3 group sessions within 5 recreation therapy group sessions Outcome: Completed/Met

## 2021-08-23 NOTE — Progress Notes (Signed)
Discharge instructions reviewed with patient including follow up appointment with provider, medication and prescriptions.  Questions answered and understanding verbalized.  Discharge packet given. All belongings returned to patient after verification completed by staff.   Patient escorted by staff off unit at this time stable without complaint.

## 2021-08-23 NOTE — Progress Notes (Signed)
Recreation Therapy Notes  INPATIENT RECREATION TR PLAN  Patient Details Name: Tracie Atkins MRN: [id number] DOB: Feb 11, 1956 Today's Date: 08/23/2021  Rec Therapy Plan Is patient appropriate for Therapeutic Recreation?: Yes Treatment times per week: at least 3 Estimated Length of Stay: 5-7 days TR Treatment/Interventions: Group participation (Comment)  Discharge Criteria Pt will be discharged from therapy if:: Discharged Treatment plan/goals/alternatives discussed and agreed upon by:: Patient/family  Discharge Summary Short term goals set: Patient will engage in groups without prompting or encouragement from LRT x3 group sessions within 5 recreation therapy group sessions Short term goals met: Complete Progress toward goals comments: Groups attended Which groups?: Communication, Coping skills Reason goals not met: N/A Therapeutic equipment acquired: N/A Reason patient discharged from therapy: Discharge from hospital Pt/family agrees with progress & goals achieved: Yes Date patient discharged from therapy: 08/23/21   Danaly Bari 08/23/2021, 2:57 PM

## 2021-08-23 NOTE — BHH Suicide Risk Assessment (Signed)
[facility] Admission Suicide Risk Assessment   Nursing information obtained from:  Patient Demographic factors:  Caucasian, Age 66 or older Current Mental Status:  NA Loss Factors:  NA Historical Factors:  NA Risk Reduction Factors:  Living with another person, especially a relative  Total Time spent with patient: 1 hour Principal Problem: MDD (major depressive disorder), recurrent severe, without psychosis ([place]) Diagnosis:  Principal Problem:   MDD (major depressive disorder), recurrent severe, without psychosis ([place])  Subjective Data: 66 year old female fell after drinking two glasses of wine with minor head injury and negative CT scan of head.  She endorsed suicidal ideation, is currently being held for TTS evaluation  Continued Clinical Symptoms:  Alcohol Use Disorder Identification Test Final Score (AUDIT): 5 The "Alcohol Use Disorders Identification Test", Guidelines for Use in Primary Care, Second Edition.  World Pharmacologist [facility]). Score between 0-7:  no or low risk or alcohol related problems. Score between 8-15:  moderate risk of alcohol related problems. Score between 16-19:  high risk of alcohol related problems. Score 20 or above:  warrants further diagnostic evaluation for alcohol dependence and treatment.   CLINICAL FACTORS:   Depression:   Anhedonia   Musculoskeletal: Strength & Muscle Tone: within normal limits Gait & Station: normal Patient leans: N/A  Psychiatric Specialty Exam:  Presentation  General Appearance: Appropriate for Environment; Casual  Eye Contact:Good  Speech:Clear and Coherent; Normal Rate  Speech Volume:Normal  Handedness:Right   Mood and Affect  Mood:Euthymic  Affect:Appropriate; Congruent   Thought Process  Thought Processes:Coherent  Descriptions of Associations:Intact  Orientation:Full (Time, Place and Person)  Thought Content:Logical  History of Schizophrenia/Schizoaffective disorder:No  Duration of Psychotic  Symptoms:No data recorded Hallucinations:No data recorded Ideas of Reference:None  Suicidal Thoughts:No data recorded Homicidal Thoughts:No data recorded  Sensorium  Memory:Immediate Good; Recent Good; Remote Good  Judgment:Good  Insight:Good   Executive Functions  Concentration:Good  Attention Span:Good  [place]  Language:Good   Psychomotor Activity  Psychomotor Activity:No data recorded  Assets  Assets:Communication Skills; Desire for Improvement; Financial Resources/Insurance; Housing; Physical Health; Transportation   Sleep  Sleep:No data recorded    Blood pressure 109/73, pulse 85, temperature 97.9 F (36.6 C), temperature source Oral, resp. rate 18, height 5\' 4"  (1.626 m), weight 74.8 kg, SpO2 97 %. Body mass index is 28.32 kg/m.   COGNITIVE FEATURES THAT CONTRIBUTE TO RISK:  Thought constriction (tunnel vision)    SUICIDE RISK:   Minimal: No identifiable suicidal ideation.  Patients presenting with no risk factors but with morbid ruminations; may be classified as minimal risk based on the severity of the depressive symptoms  PLAN OF CARE: See orders  I certify that inpatient services furnished can reasonably be expected to improve the patient's condition.   Parks Ranger, DO 08/23/2021, 11:21 AM

## 2021-08-30 ENCOUNTER — Other Ambulatory Visit: Payer: Self-pay | Admitting: Family Medicine

## 2021-08-30 DIAGNOSIS — N644 Mastodynia: Secondary | ICD-10-CM | POA: Diagnosis not present

## 2021-08-30 DIAGNOSIS — Z79899 Other long term (current) drug therapy: Secondary | ICD-10-CM | POA: Diagnosis not present

## 2021-08-30 DIAGNOSIS — N63 Unspecified lump in unspecified breast: Secondary | ICD-10-CM

## 2021-08-30 DIAGNOSIS — Z1159 Encounter for screening for other viral diseases: Secondary | ICD-10-CM | POA: Diagnosis not present

## 2021-08-30 DIAGNOSIS — N6320 Unspecified lump in the left breast, unspecified quadrant: Secondary | ICD-10-CM | POA: Diagnosis not present

## 2021-08-30 DIAGNOSIS — F1491 Cocaine use, unspecified, in remission: Secondary | ICD-10-CM | POA: Diagnosis not present

## 2021-08-30 DIAGNOSIS — K709 Alcoholic liver disease, unspecified: Secondary | ICD-10-CM

## 2021-08-30 DIAGNOSIS — F1014 Alcohol abuse with alcohol-induced mood disorder: Secondary | ICD-10-CM

## 2021-08-30 DIAGNOSIS — R011 Cardiac murmur, unspecified: Secondary | ICD-10-CM | POA: Diagnosis not present

## 2021-08-30 DIAGNOSIS — Z0000 Encounter for general adult medical examination without abnormal findings: Secondary | ICD-10-CM | POA: Diagnosis not present

## 2021-08-30 DIAGNOSIS — N643 Galactorrhea not associated with childbirth: Secondary | ICD-10-CM | POA: Diagnosis not present

## 2021-08-30 DIAGNOSIS — Z13820 Encounter for screening for osteoporosis: Secondary | ICD-10-CM

## 2021-09-09 ENCOUNTER — Other Ambulatory Visit: Payer: Self-pay | Admitting: Family Medicine

## 2021-09-09 DIAGNOSIS — N63 Unspecified lump in unspecified breast: Secondary | ICD-10-CM

## 2021-09-09 DIAGNOSIS — N644 Mastodynia: Secondary | ICD-10-CM

## 2021-09-13 ENCOUNTER — Other Ambulatory Visit: Payer: Self-pay | Admitting: Family Medicine

## 2021-09-13 DIAGNOSIS — R0609 Other forms of dyspnea: Secondary | ICD-10-CM

## 2021-09-28 ENCOUNTER — Other Ambulatory Visit: Payer: Self-pay

## 2021-09-28 ENCOUNTER — Ambulatory Visit (HOSPITAL_COMMUNITY)
Admission: EM | Admit: 2021-09-28 | Discharge: 2021-09-29 | Disposition: A | Discharge status: Home / Self Care | Payer: Medicare Other | Attending: Nurse Practitioner | Admitting: Nurse Practitioner

## 2021-09-28 DIAGNOSIS — F332 Major depressive disorder, recurrent severe without psychotic features: Secondary | ICD-10-CM | POA: Insufficient documentation

## 2021-09-28 DIAGNOSIS — Z20822 Contact with and (suspected) exposure to covid-19: Secondary | ICD-10-CM | POA: Diagnosis not present

## 2021-09-28 DIAGNOSIS — F1010 Alcohol abuse, uncomplicated: Secondary | ICD-10-CM | POA: Insufficient documentation

## 2021-09-28 DIAGNOSIS — Z79899 Other long term (current) drug therapy: Secondary | ICD-10-CM | POA: Insufficient documentation

## 2021-09-28 LAB — COMPREHENSIVE METABOLIC PANEL
ALT: 16 U/L (ref 0–44)
AST: 14 U/L — ABNORMAL LOW (ref 15–41)
Albumin: 3.5 g/dL (ref 3.5–5.0)
Alkaline Phosphatase: 159 U/L — ABNORMAL HIGH (ref 38–126)
Anion gap: 9 (ref 5–15)
BUN: 13 mg/dL (ref 8–23)
CO2: 27 mmol/L (ref 22–32)
Calcium: 9.8 mg/dL (ref 8.9–10.3)
Chloride: 108 mmol/L (ref 98–111)
Creatinine, Ser: 0.75 mg/dL (ref 0.44–1.00)
GFR, Estimated: >60 mL/min (ref >60)
Glucose, Bld: 115 mg/dL — ABNORMAL HIGH (ref 70–99)
Potassium: 4 mmol/L (ref 3.5–5.1)
Sodium: 144 mmol/L (ref 135–145)
Total Bilirubin: 0.5 mg/dL (ref 0.3–1.2)
Total Protein: 6.2 g/dL — ABNORMAL LOW (ref 6.5–8.1)

## 2021-09-28 LAB — LIPID PANEL
Cholesterol: 241 mg/dL — ABNORMAL HIGH (ref 0–200)
HDL: 61 mg/dL (ref >40)
LDL Cholesterol: 148 mg/dL — ABNORMAL HIGH (ref 0–99)
Total CHOL/HDL Ratio: 4 RATIO
Triglycerides: 158 mg/dL — ABNORMAL HIGH (ref <150)
VLDL: 32 mg/dL (ref 0–40)

## 2021-09-28 LAB — CBC WITH DIFFERENTIAL/PLATELET
Abs Immature Granulocytes: 0.01 10*3/uL (ref 0.00–0.07)
Basophils Absolute: 0 10*3/uL (ref 0.0–0.1)
Basophils Relative: 1 %
Eosinophils Absolute: 0.1 10*3/uL (ref 0.0–0.5)
Eosinophils Relative: 1 %
HCT: 39.7 % (ref 36.0–46.0)
Hemoglobin: 12.3 g/dL (ref 12.0–15.0)
Immature Granulocytes: 0 %
Lymphocytes Relative: 21 %
Lymphs Abs: 1.3 10*3/uL (ref 0.7–4.0)
MCH: 27.3 pg (ref 26.0–34.0)
MCHC: 31 g/dL (ref 30.0–36.0)
MCV: 88 fL (ref 80.0–100.0)
Monocytes Absolute: 0.6 10*3/uL (ref 0.1–1.0)
Monocytes Relative: 9 %
Neutro Abs: 4.4 10*3/uL (ref 1.7–7.7)
Neutrophils Relative %: 68 %
Platelets: 295 10*3/uL (ref 150–400)
RBC: 4.51 MIL/uL (ref 3.87–5.11)
RDW: 13.3 % (ref 11.5–15.5)
WBC: 6.4 10*3/uL (ref 4.0–10.5)
nRBC: 0 % (ref 0.0–0.2)

## 2021-09-28 LAB — ETHANOL: Alcohol, Ethyl (B): 118 mg/dL — ABNORMAL HIGH (ref <10)

## 2021-09-28 LAB — POC SARS CORONAVIRUS 2 AG: SARSCOV2ONAVIRUS 2 AG: NEGATIVE

## 2021-09-28 MED ORDER — MAGNESIUM HYDROXIDE 400 MG/5ML PO SUSP: 30.0000 mL | Freq: Every day | ORAL | Status: DC | PRN

## 2021-09-28 MED ORDER — HYDROXYZINE HCL 25 MG PO TABS: 25.0000 mg | ORAL_TABLET | Freq: Three times a day (TID) | ORAL | Status: DC | PRN

## 2021-09-28 MED ORDER — TRAZODONE HCL 50 MG PO TABS: 50.0000 mg | ORAL_TABLET | Freq: Every evening | ORAL | Status: DC | PRN

## 2021-09-28 MED ORDER — ALUM & MAG HYDROXIDE-SIMETH 200-200-20 MG/5ML PO SUSP: 30.0000 mL | ORAL | Status: DC | PRN

## 2021-09-28 MED ORDER — ACETAMINOPHEN 325 MG PO TABS: 650.0000 mg | ORAL_TABLET | Freq: Four times a day (QID) | ORAL | Status: DC | PRN

## 2021-09-28 NOTE — ED Provider Notes (Signed)
Behavioral Health Admission H&P ?(FBC & OBS) ? ?Date: 09/28/21 ?Patient Name: Trac[facility] ?MRN: [id number] ?Chief Complaint: Suicidal ideations ?Chief Complaint  ?Patient presents with  ? Depression  ? Suicidal  ?   ? ?Diagnoses:  ?Final diagnoses:  ?Severe episode of recurrent major depressive disorder, without psychotic features ([place])  ?Alcohol abuse  ? ? ?HPI: Tracie Atkins is a 67 y/o female with a history of MDD, alcohol abuse, presenting voluntarily to [facility] via [facility] for suicidal thoughts. Patient reports that she has been drinking all day, unable to state a quantity. Patient reports that she took 3 of her pills for depression that she is only prescribed 1 pill daily at 8 am or 9 am this morning. Patient reports that she cannot remember the name of the medication. Patient reports that she moved form [place] to live with her son four months ago and has been feeling depressed since moving her and plans to move back in April when she had some money. Patient states that she lives with her son Tracie [facility]kins and his wife and she does not like how his wife treats him. Patient was in rehab in November 2022 for smoking  crack. Patient reports that she started smoking crack at age [age]. Patient endorses feeling depressed, sad, anxious with racing thoughts, poor appetite, and anhedonia. ? ?During the assessment patient repeated the statement that "I hate myself" and " I am tired of my life". Patient feels her sons do not have time for her and that she is not wanted.  ? ?Patient is alert oriented x4, calm and cooperative and does not seem to be responding to any internal or external stimuli. Patient denies any trauma history. Patient denies any HI or AVH. Patient stated that she did not want her son called to be called to let him know that she is here. Patient is in agreement to come to [facility] continuous assessment.  ? ?PHQ 2-9:   ?Flowsheet Row Admission (Discharged) from 08/19/2021 in [facility] from 08/18/2021 in [facility] from 06/12/2018 in [facility] DEPT  ?C-SSRS RISK CATEGORY No Risk Moderate Risk High Risk  ? ?  ?  ? ?Total Time spent with patient: 30 minutes ? ?Musculoskeletal  ?Strength & Muscle Tone: within normal limits ?Gait & Station: normal ?Patient leans: N/A ? ?Psychiatric Specialty Exam  ?Presentation ?General Appearance: Casual ? ?Eye Contact:Good ? ?Speech:Clear and Coherent ? ?Speech Volume:Normal ? ?Handedness:Right ? ? ?Mood and Affect  ?Mood:Depressed ? ?Affect:Congruent ? ? ?Thought Process  ?Thought Processes:Coherent ? ?Descriptions of Associations:Intact ? ?Orientation:Full (Time, Place and Person) ? ?Thought Content:WDL ? Diagnosis of Schizophrenia or Schizoaffective disorder in past: No ?  ?Hallucinations:Hallucinations: None ? ?Ideas of Reference:None ? ?Suicidal Thoughts:Suicidal Thoughts: Yes, Active ?SI Active Intent and/or Plan: With Intent; With Plan; With Means to Carry Out ? ?Homicidal Thoughts:Homicidal Thoughts: No ? ? ?Sensorium  ?Memory:Immediate Good; Recent Good; Remote Good ? ?Judgment:Fair ? ?Insight:Fair ? ? ?Executive Functions  ?Concentration:Good ? ?Attention Span:Good ? ?Recall:Good ? ?Fund of [place] ? ?Language:Good ? ? ?Psychomotor Activity  ?Psychomotor Activity:Psychomotor Activity: Normal ? ? ?Assets  ?Assets:Communication Skills; Desire for Improvement; Financial Resources/Insurance; Housing; Resilience; Social Support ? ? ?Sleep  ?Sleep:Sleep: Poor ?Number of Hours of Sleep: -1 ? ? ?Nutritional Assessment (For OBS and FBC admissions only) ?Has the patient had a weight loss or gain of 10 pounds or more in the last 3 months?: No ?Has  the patient had a decrease in food intake/or appetite?: Yes ?Does the patient have dental problems?: Yes ?Does the patient have eating habits or behaviors that may be indicators of an eating disorder including binging or inducing  vomiting?: No ?Has the patient recently lost weight without trying?: 0 ?Has the patient been eating poorly because of a decreased appetite?: 1 ?Malnutrition Screening Tool Score: 1 ? ? ? ?Physical Exam ?HENT:  ?   Head: Normocephalic.  ?   Nose: Nose normal.  ?Eyes:  ?   Pupils: Pupils are equal, round, and reactive to light.  ?Cardiovascular:  ?   Rate and Rhythm: Normal rate.  ?Pulmonary:  ?   Effort: Pulmonary effort is normal.  ?Abdominal:  ?   General: Abdomen is flat.  ?Musculoskeletal:     ?   General: Normal range of motion.  ?   Cervical back: Normal range of motion.  ?Skin: ?   General: Skin is warm.  ?Neurological:  ?   Mental Status: She is alert and oriented to person, place, and time.  ?Psychiatric:     ?   Attention and Perception: Attention normal.     ?   Mood and Affect: Mood is depressed.     ?   Speech: Speech normal.     ?   Behavior: Behavior is cooperative.     ?   Thought Content: Thought content is not paranoid or delusional. Thought content includes suicidal ideation. Thought content does not include homicidal ideation. Thought content includes suicidal plan. Thought content does not include homicidal plan.     ?   Cognition and Memory: Cognition normal.     ?   Judgment: Judgment is impulsive.  ? ?Review of Systems  ?Constitutional: Negative.   ?HENT: Negative.    ?Eyes: Negative.   ?Respiratory: Negative.    ?Cardiovascular: Negative.   ?Gastrointestinal: Negative.   ?Genitourinary: Negative.   ?Musculoskeletal: Negative.   ?Skin: Negative.   ?Neurological: Negative.   ?Endo/Heme/Allergies: Negative.   ?Psychiatric/Behavioral:  Positive for depression, substance abuse and suicidal ideas.   ? ?Blood pressure (!) 92/56, pulse 86, temperature 98.7 ?F (37.1 ?C), temperature source Oral, resp. rate 18, SpO2 97 %. There is no height or weight on file to calculate BMI. ? ?Past Psychiatric History: Rehab in November 2022 for substance abuse ? ?Is the patient at risk to self? Yes  ?Has the patient  been a risk to self in the past 6 months? No .    ?Has the patient been a risk to self within the distant past? No   ?Is the patient a risk to others? No   ?Has the patient been a risk to others in the past 6 months? No   ?Has the patient been a risk to others within the distant past? No  ? ?Past Medical History:  ?Past Medical History:  ?Diagnosis Date  ? Chronic dental infection 8/189  ? multiple teeth removed and Post -op infection.  ? Depression   ? ETOH abuse   ? Headache   ?  ?Past Surgical History:  ?Procedure Laterality Date  ? DENTAL SURGERY    ? TUBAL LIGATION    ? ? ?Family History: No family history on file. ? ?Social History:  ?Social History  ? ?Socioeconomic History  ? Marital status: Single  ?  Spouse name: Not on file  ? Number of children: Not on file  ? Years of education: Not on file  ? Highest education level:  Not on file  ?Occupational History  ? Not on file  ?Tobacco Use  ? Smoking status: Never  ?  Passive exposure: Never  ? Smokeless tobacco: Never  ?Vaping Use  ? Vaping Use: Never used  ?Substance and Sexual Activity  ? Alcohol use: Not Currently  ?  Alcohol/week: 2.0 standard drinks  ?  Types: 2 Cans of beer per week  ? Drug use: No  ? Sexual activity: Never  ?Other Topics Concern  ? Not on file  ?Social History Narrative  ? Not on file  ? ?Social Determinants of Health  ? ?Financial Resource Strain: Not on file  ?Food Insecurity: Not on file  ?Transportation Needs: Not on file  ?Physical Activity: Not on file  ?Stress: Not on file  ?Social Connections: Not on file  ?Intimate Partner Violence: Not on file  ? ? ?SDOH:  ?SDOH Screenings  ? ?Alcohol Screen: Low Risk   ? Last Alcohol Screening Score (AUDIT): 5  ?Depression (PHQ2-9): Not on file  ?Financial Resource Strain: Not on file  ?Food Insecurity: Not on file  ?Housing: Not on file  ?Physical Activity: Not on file  ?Social Connections: Not on file  ?Stress: Not on file  ?Tobacco Use: Low Risk   ? Smoking Tobacco Use: Never  ? Smokeless  Tobacco Use: Never  ? Passive Exposure: Never  ?Transportation Needs: Not on file  ? ? ?Last Labs:  ?Admission on 08/19/2021, Discharged on 08/23/2021  ?Component Date Value Ref Range Status  ? Cholestero

## 2021-09-28 NOTE — BH Assessment (Signed)
Comprehensive Clinical Assessment (CCA) Note  09/28/2021 [facility] [id number]  Discharge Disposition: Tracie Bering, NP, reviewed pt's chart and information and met with pt face-to-face and determined pt should receive continuous assessment and be re-assessed in the morning. Pt was accepted to the [facility]  The patient demonstrates the following risk factors for suicide: Chronic risk factors for suicide include: psychiatric disorder of MDD, Recurrent, Severe, substance use disorder, and previous suicide attempts most recent earlier today . Acute risk factors for suicide include: family or marital conflict, social withdrawal/isolation, loss (financial, interpersonal, professional), and recent discharge from inpatient psychiatry. Protective factors for this patient include:  none . Considering these factors, the overall suicide risk at this point appears to be high. Patient is not appropriate for outpatient follow up.  Therefore, a 1:1 sitter is recommended for suicide precautions.  Flowsheet Row ED from 09/28/2021 in [facility] ([facility]) from [facility]/19/2021 [facility] from 08/18/2021 [facility]  C-SSRS RISK CATEGORY High Risk No Risk Moderate Risk     Chief Complaint:  Chief Complaint  Patient presents with   Depression   Suicidal   Visit Diagnosis: MDD, Recurrent, Severe; EtOH abuse  CCA Screening, Triage and Referral (STR) Tracie Atkins is a 66 year old patient who was brought to the [facility] by [facility] due to a worried civilian seeing pt crying on the sidewalk and calling 911. Pt shares she moved from [place] to be with her sons but that it has not been a good transition. She states that, just prior to leaving [place] she was in treatment for crack cocaine abuse, which she states she has been using since age 74. Pt shares she has nothing to do and she is essentially ignored by her family.  Pt's chart  reveals she relapsed on EtOH last month (August 18, 2021). Due to this, pt states she feel like, "my life is messed up. I ame from [place] to live with my kids and it's not working out. Yesterday I felt like going out, getting drunk, and taking my life. The police brought me here. I've been out drinking all day. I'm depressed. I just hate myself - I don't know why."  Pt acknowledges she took 3 prescription pills instead of her 1 pill and drank with the medication, hoping to end her life. She was hospitalized at [facility] from 08/19/21 - 08/23/21. Pt denies HI, AVH, NSSIB, access to guns/weapons, or engagement with the legal system.  Pt is oriented x5. Her memory is intact with the exception of what happened today while she was drinking, which is likely due to intoxication. Pt's insight, judgement, and impulse control is impaired at this time.  Patient Reported Information How did you hear about [place]? Legal System  What Is the Reason for Your Visit/Call Today? BIB [facility] after someone called said that pt was sitting on side walk crying. Pt reporting SI with plan to OD on medicaitons.  How Long Has This Been Causing You Problems? 1-6 months  What Do You Feel Would Help You the Most Today? Treatment for Depression or other mood problem   Have You Recently Had Any Thoughts About Hurting Yourself? Yes  Are You Planning to Commit Suicide/Harm Yourself At This time? Yes   Have you Recently Had Thoughts About Hurting Someone [facility]? No  Are You Planning to Harm Someone at This Time? No  Explanation: No data recorded  Have You Used Any Alcohol or Drugs in the  Past 24 Hours? No  How Long Ago Did You Use Drugs or Alcohol? No data recorded What Did You Use and How Much? "2 big bottles of alcohol"   Do You Currently Have a Therapist/Psychiatrist? No  Name of Therapist/Psychiatrist: No data recorded  Have You Been Recently Discharged From Any Office Practice or Programs? Yes  Explanation of Discharge From  Practice/Program: Pt was hospitalized at [facility] from 08/19/21 - 08/23/21     CCA Screening Triage Referral Assessment Type of Contact: Face-to-Face  Telemedicine Service Delivery:   Is this Initial or Reassessment? Initial Assessment  Date Telepsych consult ordered in [facility]:  08/19/21  Time Telepsych consult ordered in [facility]:  0815  Location of Assessment: [facility]  Provider Location: [facility]   Collateral Involvement: Pt declined   Does Patient Have a Automotive engineer Guardian? No data recorded Name and Contact of Legal Guardian: No data recorded If Minor and Not Living with Parent(s), Who has Custody? N/A  Is CPS involved or ever been involved? Never  Is APS involved or ever been involved? Never   Patient Determined To Be At Risk for Harm To Self or Others Based on Review of Patient Reported Information or Presenting Complaint? Yes, for Self-Harm  Method: No data recorded Availability of Means: No data recorded Intent: No data recorded Notification Required: No data recorded Additional Information for Danger to Others Potential: No data recorded Additional Comments for Danger to Others Potential: No data recorded Are There Guns or Other Weapons in Your Home? No data recorded Types of Guns/Weapons: No data recorded Are These Weapons Safely Secured?                            No data recorded Who Could Verify You Are Able To Have These Secured: No data recorded Do You Have any Outstanding Charges, Pending Court Dates, Parole/Probation? No data recorded Contacted To Inform of Risk of Harm To Self or Others: Law Enforcement ([facility] are aware; pt declined to permit clinician to make contact with her sons)    Does Patient Present under Involuntary Commitment? No  IVC Papers Initial File Date: No data recorded  [place] of Residence: [place]   Patient Currently Receiving the Following Services: Not Receiving Services   Determination of  Need: Emergent (2 hours)   Options For Referral: Medication Management; Outpatient Therapy; BH Urgent Care     CCA Biopsychosocial Patient Reported Schizophrenia/Schizoaffective Diagnosis in Past: No   Strengths: Pt wants to maintain sobriety. She wants to get better so she can feel happier about her life.   Mental Health Symptoms Depression:   Change in energy/activity; Hopelessness; Irritability; Increase/decrease in appetite; Sleep (too much or little); Tearfulness; Worthlessness   Duration of Depressive symptoms:  Duration of Depressive Symptoms: Greater than two weeks   Mania:   None   Anxiety:    Worrying; Tension   Psychosis:   None   Duration of Psychotic symptoms:    Trauma:   None   Obsessions:   None   Compulsions:   None   Inattention:   None   Hyperactivity/Impulsivity:   None   Oppositional/Defiant Behaviors:   None   Emotional Irregularity:   Intense/unstable relationships; Intense/inappropriate anger; Potentially harmful impulsivity; Recurrent suicidal behaviors/gestures/threats; Mood lability   Other Mood/Personality Symptoms:   None noted    Mental Status Exam Appearance and self-care  Stature:   Average   Weight:  Average weight   Clothing:   Age-appropriate   Grooming:   Normal   Cosmetic use:   None   Posture/gait:   Normal   Motor activity:   Not Remarkable   Sensorium  Attention:   Normal   Concentration:   Normal   Orientation:   X5   Recall/memory:   Defective in Short-term (Due to EtOH use)   Affect and Mood  Affect:   Depressed   Mood:   Depressed   Relating  Eye contact:   Fleeting   Facial expression:   Depressed   Attitude toward examiner:   Cooperative   Thought and Language  Speech flow:  Clear and Coherent   Thought content:   Appropriate to Mood and Circumstances   Preoccupation:   None   Hallucinations:   None   Organization:  No data recorded  [place] of Knowledge:   Average   Intelligence:   Above Average   Abstraction:   Normal   Judgement:   Impaired   Reality Testing:   Adequate   Insight:   Fair   Decision Making:   Impulsive   Social Functioning  Social Maturity:   Impulsive   Social Judgement:   Normal   Stress  Stressors:   Family conflict; Transitions; Housing   Coping Ability:   Overwhelmed; Exhausted   Skill Deficits:   Self-control   Supports:   Family; Support needed     Religion: Religion/Spirituality Are You A Religious Person?:  (Not assessed) How Might This Affect Treatment?: Not assessed  Leisure/Recreation: Leisure / Recreation Do You Have Hobbies?: No Leisure and Hobbies: Pt denies she has anything to do.  Exercise/Diet: Exercise/Diet Do You Exercise?:  (Not assessed) Have You Gained or Lost A Significant Amount of Weight in the Past Six Months?: No Do You Follow a Special Diet?: No Do You Have Any Trouble Sleeping?: Yes Explanation of Sleeping Difficulties: Pt has medication to assist with sleep   CCA Employment/Education Employment/Work Situation: Employment / Work Situation Employment Situation: On disability Why is Patient on Disability: Pt shares she does not read or write well How Long has Patient Been on Disability: Since 1999 Patient's Job has Been Impacted by Current Illness:  (N/A) Describe how Patient's Job has Been Impacted: N/A Has Patient ever Been in the [employer]?: No  Education: Education Is Patient Currently Attending School?: No Last Grade Completed:  (Not assessed) Did You Attend College?:  (Not assessed) Did You Have An Individualized Education Program (IIEP):  (Not assessed) Did You Have Any Difficulty At School?:  (Not assessed) Patient's Education Has Been Impacted by Current Illness:  (Not assessed)   CCA Family/Childhood History Family and Relationship History: Family history Marital status: Single Does patient have  children?: Yes (3 sons) How many children?: 3 How is patient's relationship with their children?: Pt states that she is fairly close with 2 of her sons but feels that they are somewhat estranged currently  Childhood History:  Childhood History By whom was/is the patient raised?: Mother Did patient suffer any verbal/emotional/physical/sexual abuse as a child?: No Did patient suffer from severe childhood neglect?: No Has patient ever been sexually abused/assaulted/raped as an adolescent or adult?: No Was the patient ever a victim of a crime or a disaster?: No Witnessed domestic violence?: No Has patient been affected by domestic violence as an adult?: No  Child/Adolescent Assessment:     CCA Substance Use Alcohol/Drug Use: Alcohol / Drug Use Pain Medications:  See MAR Prescriptions: See MAR Over the Counter: See MAR History of alcohol / drug use?: Yes Longest period of sobriety (when/how long): Sober for 4 months this year. Relapsed on ETOH 08/18/21. Has maintained sobriety from crack cocaine since November 2022. Negative Consequences of Use: Personal relationships Withdrawal Symptoms: None Substance #1 Name of Substance 1: EtOH 1 - Age of First Use: Unknown 1 - Amount (size/oz): Varies 1 - Frequency: Varies 1 - Duration: Since 08/18/21 1 - Last Use / Amount: Today (09/28/21) 1 - Method of Aquiring: Purchase 1- Route of Use: Oral Substance #2 Name of Substance 2: Crack cocaine 2 - Age of First Use: 27 2 - Amount (size/oz): Varied 2 - Frequency: Daily 2 - Duration: From age 59 - November 2022 2 - Last Use / Amount: 4 months ago 2 - Method of Aquiring: Unknown 2 - Route of Substance Use: Smoke                     ASAM's:  Six Dimensions of Multidimensional Assessment  Dimension 1:  Acute Intoxication and/or Withdrawal Potential:      Dimension 2:  Biomedical Conditions and Complications:      Dimension 3:  Emotional, Behavioral, or Cognitive Conditions and  Complications:     Dimension 4:  Readiness to Change:     Dimension 5:  Relapse, Continued use, or Continued Problem Potential:     Dimension 6:  Recovery/Living Environment:     ASAM Severity Score:    ASAM Recommended Level of Treatment: ASAM Recommended Level of Treatment: Level II Intensive Outpatient Treatment   Substance use Disorder (SUD) Substance Use Disorder (SUD)  Checklist Symptoms of Substance Use: Continued use despite having a persistent/recurrent physical/psychological problem caused/exacerbated by use, Continued use despite persistent or recurrent social, interpersonal problems, caused or exacerbated by use, Persistent desire or unsuccessful efforts to cut down or control use, Substance(s) often taken in larger amounts or over longer times than was intended  Recommendations for Services/Supports/Treatments: Recommendations for Services/Supports/Treatments Recommendations For Services/Supports/Treatments: IOP (Intensive Outpatient Program), Peer Support, Individual Therapy, Medication Management, Other (Comment) (Continuous Assessment at [facility])  Discharge Disposition: Discharge Disposition Medical Exam completed: Yes Disposition of Patient: Admit Mode of transportation if patient is discharged/movement?: N/A  Tracie Bering, NP, reviewed pt's chart and information and met with pt face-to-face and determined pt should receive continuous assessment and be re-assessed in the morning. Pt was accepted to the [facility].  DSM5 Diagnoses: Patient Active Problem List   Diagnosis Date Noted   MDD (major depressive disorder), recurrent severe, without psychosis (HCC) 08/19/2021   Suicidal ideations    Alcohol abuse with alcohol-induced mood disorder (HCC) 11/20/2016     Referrals to Alternative Service(s): Referred to Alternative Service(s):   Place:   Date:   Time:    Referred to Alternative Service(s):   Place:   Date:   Time:    Referred to Alternative Service(s):   Place:    Date:   Time:    Referred to Alternative Service(s):   Place:   Date:   Time:     Ralph Dowdy, LMFT

## 2021-09-28 NOTE — ED Notes (Signed)
Pt admitted to obs due to SI with plan to OD. Pt A&O x4, calm and cooperative. Pt denies HI/AVH. Pt tolerated skin assessment and lab work well. Pt unable to urinate at this time, specimen cup provided and at bedside. Pt ambulated independently to unit. Oriented to unit/staff. Meal given per pt request. No signs of acute distress noted. Will continue to monitor for safety. ?

## 2021-09-28 NOTE — BH Assessment (Signed)
Pt BIB [employer] after someone called them stating that pt was sitting on the sidewalk crying. Pt reports SI with plan to OD. Pt repeating "I want to go back home". Reports moving to [place] from [place] to be with her 3 sons. Pt tearful during triage. Pt denies HI, AVH and substance use. Per [employer] pt is homeless.  ? ?Pt is emergent  ?

## 2021-09-29 DIAGNOSIS — F1010 Alcohol abuse, uncomplicated: Secondary | ICD-10-CM

## 2021-09-29 DIAGNOSIS — F332 Major depressive disorder, recurrent severe without psychotic features: Secondary | ICD-10-CM | POA: Diagnosis not present

## 2021-09-29 LAB — POCT URINE DRUG SCREEN - MANUAL ENTRY (I-SCREEN)
POC Amphetamine UR: NOT DETECTED
POC Buprenorphine (BUP): NOT DETECTED
POC Cocaine UR: NOT DETECTED
POC Marijuana UR: NOT DETECTED
POC Methadone UR: NOT DETECTED
POC Methamphetamine UR: NOT DETECTED
POC Morphine: NOT DETECTED
POC Oxazepam (BZO): NOT DETECTED
POC Oxycodone UR: NOT DETECTED
POC Secobarbital (BAR): NOT DETECTED

## 2021-09-29 LAB — RESP PANEL BY RT-PCR (FLU A&B, COVID) ARPGX2
Influenza A by PCR: NEGATIVE
Influenza B by PCR: NEGATIVE
SARS Coronavirus 2 by RT PCR: NEGATIVE

## 2021-09-29 NOTE — ED Notes (Signed)
Patient had Lunch ?

## 2021-09-29 NOTE — ED Notes (Signed)
Pt asleep in bed. Respirations even and unlabored. Will continue to monitor for safety. ?

## 2021-09-29 NOTE — ED Provider Notes (Addendum)
[facility]/OBS ASAP Discharge Summary ? ?Date and Time: 09/29/2021 2:43 PM  ?Name: Tracie Atkins  ?MRN:  [id number]  ? ?Discharge Diagnoses:  ?Final diagnoses:  ?Severe episode of recurrent major depressive disorder, without psychotic features ([place])  ?Alcohol abuse  ? ? ?Subjective: Tracie Atkins reported " feeling a little bit better today, I am trying to get a hold of my son."  ? ?Tracie Atkins 66 year old female that presented due to suicidal ideations currently she is denying suicidal or homicidal ideations.  Denies auditory hallucinations.  On admission BAL was 118.  Stated when she drinks she gets really depressed and misses home.  States she is originally from [place] and would like to go back.  States she gets her check on the first of the month and has plans to go back to [place].  Patient was offered therapy and psychiatry services for the area.  She was receptive to plan.  Support, encouragement and reassurance was provided. ? ? ?During evaluation Tracie Atkins is sitting in no acute distress. She is alert/oriented x 4; calm/cooperative; and mood congruent with affect.She is speaking in a clear tone at moderate volume, and normal pace; with good eye contact. Her thought process is coherent and relevant; There is no indication that she is currently responding to internal/external stimuli or experiencing delusional thought content; and she has denied suicidal/self-harm/homicidal ideation, psychosis, and paranoia.  Patient has remained calm throughout assessment and has answered questions appropriately.   ? ? ?At this time Tracie Atkins is educated and verbalizes understanding of mental health resources and other crisis services in the community.She is instructed to call 911 and present to the nearest emergency room should she experience any suicidal/homicidal ideation, auditory/visual/hallucinations, or detrimental worsening of her mental health condition. She was a also advised by Probation officer that she could call the toll-free  phone on insurance card to assist with identifying in network counselors and agencies or number on back of Medicaid card to speak with care coordinator.  ?  ? ?Stay Summary:  ? ?Total Time spent with patient: 15 minutes ? ?Past Psychiatric History:  ?Past Medical History:  ?Past Medical History:  ?Diagnosis Date  ? Chronic dental infection 8/189  ? multiple teeth removed and Post -op infection.  ? Depression   ? ETOH abuse   ? Headache   ?  ?Past Surgical History:  ?Procedure Laterality Date  ? DENTAL SURGERY    ? TUBAL LIGATION    ? ?Family History: No family history on file. ?Family Psychiatric History:  ?Social History:  ?Social History  ? ?Substance and Sexual Activity  ?Alcohol Use Not Currently  ? Alcohol/week: 2.0 standard drinks  ? Types: 2 Cans of beer per week  ?   ?Social History  ? ?Substance and Sexual Activity  ?Drug Use No  ?  ?Social History  ? ?Socioeconomic History  ? Marital status: Single  ?  Spouse name: Not on file  ? Number of children: Not on file  ? Years of education: Not on file  ? Highest education level: Not on file  ?Occupational History  ? Not on file  ?Tobacco Use  ? Smoking status: Never  ?  Passive exposure: Never  ? Smokeless tobacco: Never  ?Vaping Use  ? Vaping Use: Never used  ?Substance and Sexual Activity  ? Alcohol use: Not Currently  ?  Alcohol/week: 2.0 standard drinks  ?  Types: 2 Cans of beer per week  ? Drug use: No  ? Sexual activity: Never  ?  Other Topics Concern  ? Not on file  ?Social History Narrative  ? Not on file  ? ?Social Determinants of Health  ? ?Financial Resource Strain: Not on file  ?Food Insecurity: Not on file  ?Transportation Needs: Not on file  ?Physical Activity: Not on file  ?Stress: Not on file  ?Social Connections: Not on file  ? ?SDOH:  ?SDOH Screenings  ? ?Alcohol Screen: Low Risk   ? Last Alcohol Screening Score (AUDIT): 5  ?Depression (PHQ2-9): Medium Risk  ? PHQ-2 Score: 14  ?Financial Resource Strain: Not on file  ?Food Insecurity: Not on file   ?Housing: Not on file  ?Physical Activity: Not on file  ?Social Connections: Not on file  ?Stress: Not on file  ?Tobacco Use: Low Risk   ? Smoking Tobacco Use: Never  ? Smokeless Tobacco Use: Never  ? Passive Exposure: Never  ?Transportation Needs: Not on file  ? ? ?Tobacco Cessation:  N/A, patient does not currently use tobacco products ? ?Current Medications:  ?Current Facility-Administered Medications  ?Medication Dose Route Frequency Provider Last Rate Last Admin  ? acetaminophen (TYLENOL) tablet 650 mg  650 mg Oral Q6H PRN Bobbitt, Shalon E, NP      ? alum & mag hydroxide-simeth (MAALOX/MYLANTA) 200-200-20 MG/5ML suspension 30 mL  30 mL Oral Q4H PRN Bobbitt, Shalon E, NP      ? hydrOXYzine (ATARAX) tablet 25 mg  25 mg Oral TID PRN Bobbitt, Shalon E, NP      ? magnesium hydroxide (MILK OF MAGNESIA) suspension 30 mL  30 mL Oral Daily PRN Bobbitt, Shalon E, NP      ? traZODone (DESYREL) tablet 50 mg  50 mg Oral QHS PRN Bobbitt, Shalon E, NP      ? ?Current Outpatient Medications  ?Medication Sig Dispense Refill  ? FLUoxetine (PROZAC) 20 MG capsule Take 1 capsule (20 mg total) by mouth daily. 39 capsule 2  ? topiramate (TOPAMAX) 50 MG tablet Take 1 tablet (50 mg total) by mouth 2 (two) times daily. 60 tablet 2  ? ? ?PTA Medications: (Not in a hospital admission) ? ? ?Musculoskeletal  ?Strength & Muscle Tone: within normal limits ?Gait & Station: normal ?Patient leans: N/A ? ?Psychiatric Specialty Exam  ?Presentation  ?General Appearance: Appropriate for Environment ? ?Eye Contact:Good ? ?Speech:Clear and Coherent ? ?Speech Volume:Normal ? ?Handedness:Right ? ? ?Mood and Affect  ?Mood:Anxious ? ?Affect:Appropriate ? ? ?Thought Process  ?Thought Processes:Coherent ? ?Descriptions of Associations:Intact ? ?Orientation:Full (Time, Place and Person) ? ?Thought Content:Logical ? Diagnosis of Schizophrenia or Schizoaffective disorder in past: No ?  ? Hallucinations:Hallucinations: None ? ?Ideas of  Reference:None ? ?Suicidal Thoughts:Suicidal Thoughts: No ?SI Active Intent and/or Plan: With Intent; With Plan; With Means to Carry Out ? ?Homicidal Thoughts:Homicidal Thoughts: No ? ? ?Sensorium  ?Memory:Immediate Fair; Recent Fair; Remote Fair ? ?Judgment:Fair ? ?Insight:Fair ? ? ?Executive Functions  ?Concentration:Fair ? ?Attention Span:Good ? ?Recall:Fair ? ?[place] ? ?Language:Good ? ? ?Psychomotor Activity  ?Psychomotor Activity:Psychomotor Activity: Normal ? ? ?Assets  ?Assets:Desire for Improvement; Housing; Data processing manager; Transportation ? ? ?Sleep  ?Sleep:Sleep: Fair ?Number of Hours of Sleep: -1 ? ? ?Nutritional Assessment (For OBS and [facility] admissions only) ?Has the patient had a weight loss or gain of 10 pounds or more in the last 3 months?: No ?Has the patient had a decrease in food intake/or appetite?: No ?Does the patient have dental problems?: No ?Does the patient have eating habits or behaviors that may be indicators of  an eating disorder including binging or inducing vomiting?: No ?Has the patient recently lost weight without trying?: 0 ?Has the patient been eating poorly because of a decreased appetite?: 0 ?Malnutrition Screening Tool Score: 0 ? ? ? ?Physical Exam  ?Physical Exam ?Vitals reviewed.  ?Cardiovascular:  ?   Rate and Rhythm: Normal rate and regular rhythm.  ?Skin: ?   General: Skin is warm and dry.  ?Neurological:  ?   Mental Status: She is oriented to person, place, and time.  ?Psychiatric:     ?   Attention and Perception: Attention normal.     ?   Mood and Affect: Mood normal.     ?   Speech: Speech normal.     ?   Behavior: Behavior normal.     ?   Thought Content: Thought content normal.     ?   Cognition and Memory: Cognition normal.     ?   Judgment: Judgment normal.  ? ?Review of Systems  ?Constitutional: Negative.   ?Eyes: Negative.   ?Cardiovascular: Negative.   ?Genitourinary: Negative.   ?Skin: Negative.   ?Psychiatric/Behavioral:  Positive for depression  and substance abuse. Negative for suicidal ideas. The patient is nervous/anxious.   ?All other systems reviewed and are negative. ?Blood pressure (!) 144/95, pulse 100, temperature 98.4 ?F (36.9 ?C), temperature source Oral, r

## 2021-09-29 NOTE — ED Notes (Signed)
Discharge instructions provided and Pt stated understanding. Pt alert, orient and ambulatory prior to d/c from facility. Personal belongings returned from locker number 11. Bus pass provided. Instructed Pt where to go to find the buss stop after returning her personal belongings. Safety maintained.   ?

## 2021-09-29 NOTE — Discharge Instructions (Addendum)
Take all medications as prescribed. Keep all follow-up appointments as scheduled.  Do not consume alcohol or use illegal drugs while on prescription medications. Report any adverse effects from your medications to your primary care provider promptly.  In the event of recurrent symptoms or worsening symptoms, call 911, a crisis hotline, or go to the nearest emergency department for evaluation.   

## 2021-09-30 ENCOUNTER — Ambulatory Visit
Admission: RE | Admit: 2021-09-30 | Discharge: 2021-09-30 | Disposition: A | Discharge status: Home / Self Care | Payer: Medicare Other | Source: Ambulatory Visit | Attending: Family Medicine | Admitting: Family Medicine

## 2021-09-30 ENCOUNTER — Other Ambulatory Visit: Payer: Self-pay | Admitting: Family Medicine

## 2021-09-30 ENCOUNTER — Other Ambulatory Visit (HOSPITAL_COMMUNITY): Payer: Self-pay | Admitting: Family Medicine

## 2021-09-30 DIAGNOSIS — N63 Unspecified lump in unspecified breast: Secondary | ICD-10-CM

## 2021-09-30 DIAGNOSIS — R599 Enlarged lymph nodes, unspecified: Secondary | ICD-10-CM

## 2021-09-30 DIAGNOSIS — N644 Mastodynia: Secondary | ICD-10-CM

## 2021-09-30 DIAGNOSIS — N6320 Unspecified lump in the left breast, unspecified quadrant: Secondary | ICD-10-CM

## 2021-09-30 DIAGNOSIS — N643 Galactorrhea not associated with childbirth: Secondary | ICD-10-CM

## 2021-09-30 DIAGNOSIS — I10 Essential (primary) hypertension: Secondary | ICD-10-CM

## 2021-09-30 IMAGING — MG DIGITAL DIAGNOSTIC BILAT W/ TOMO W/ CAD
7 of 12 series · 7 of 36 positions shown · non-contrast
Comparison: None.

CLINICAL DATA: Patient describes a palpable lump within the LEFT
breast with associated pain. Patient also describes RIGHT-sided
nipple discharge which is clear and spontaneous.



[L TAN synth-2D]
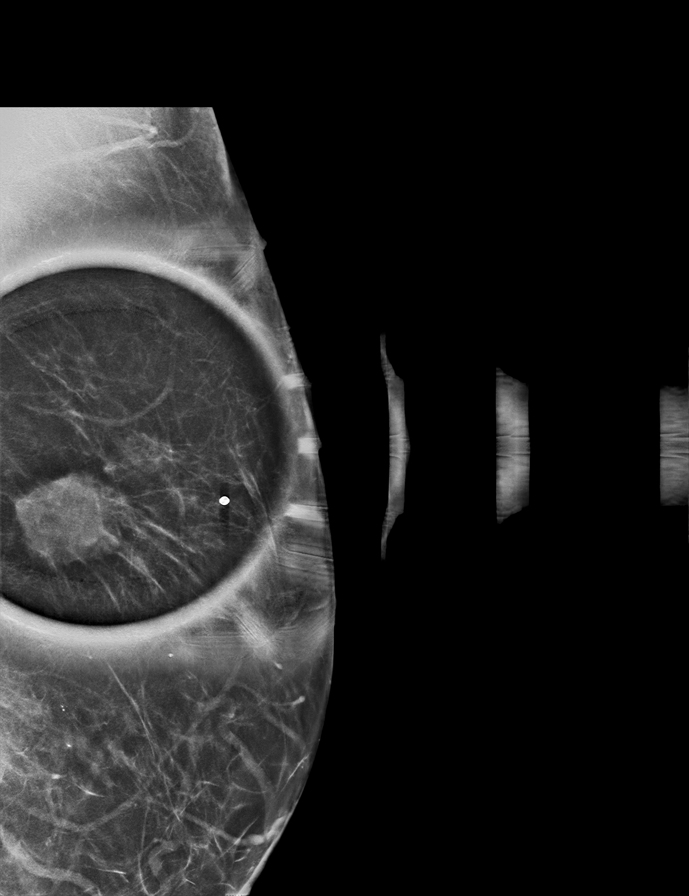

[L CC synth-2D]
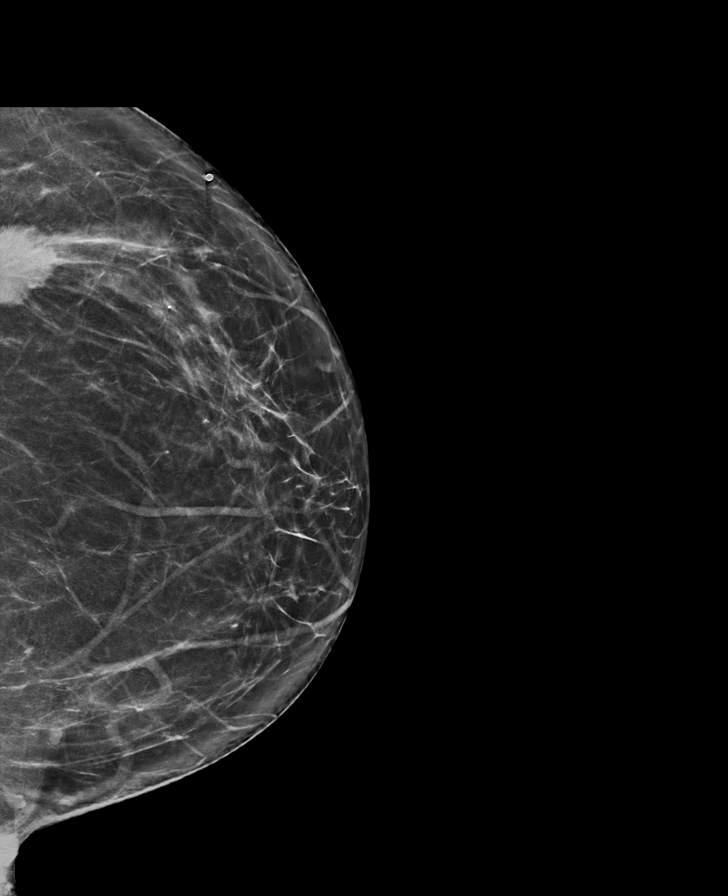

[L MLO synth-2D]
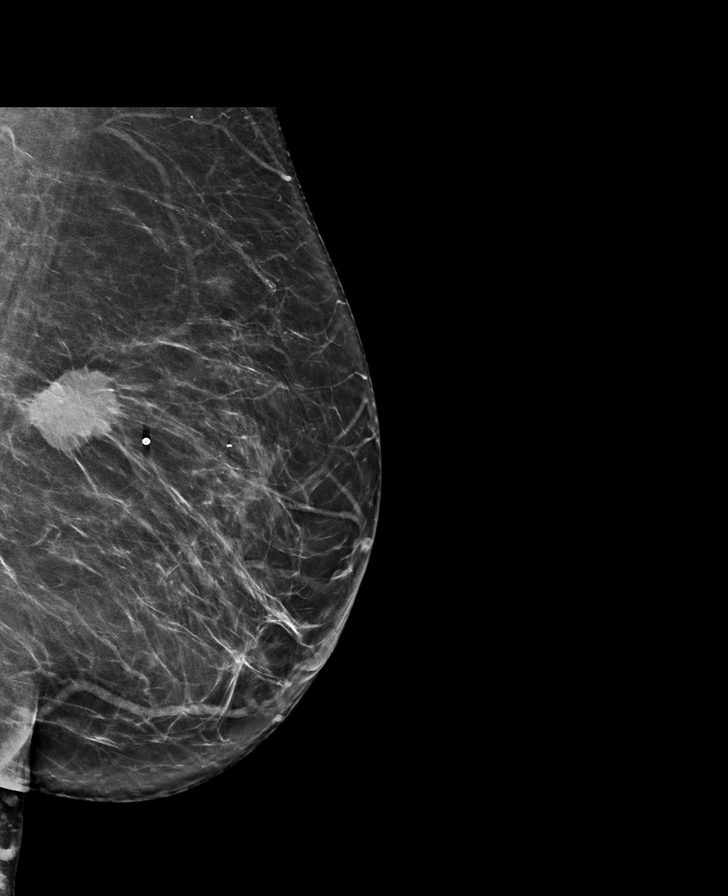

[R MLO synth-2D (1 of 2)]
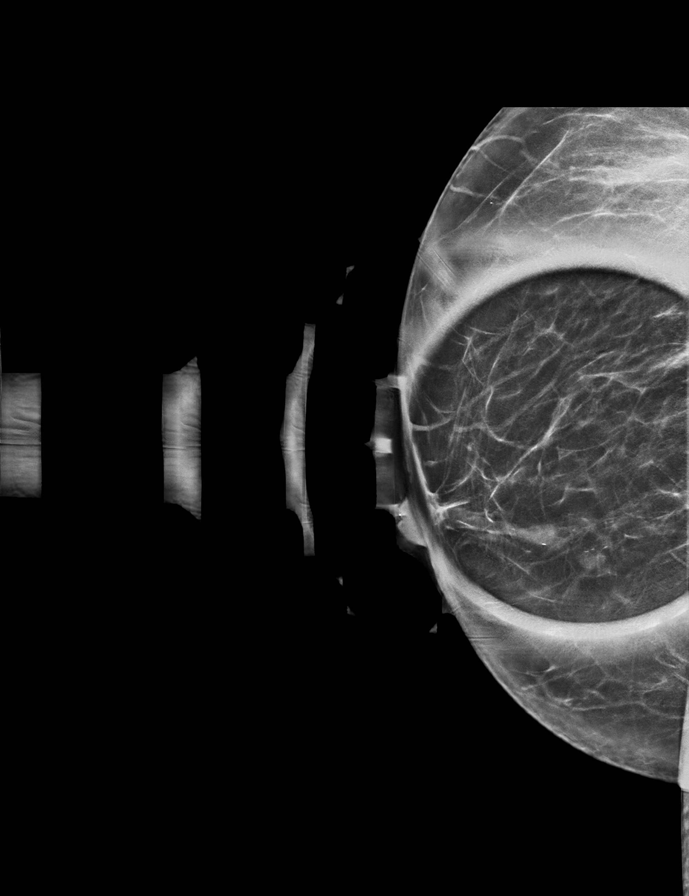

[R CC synth-2D]
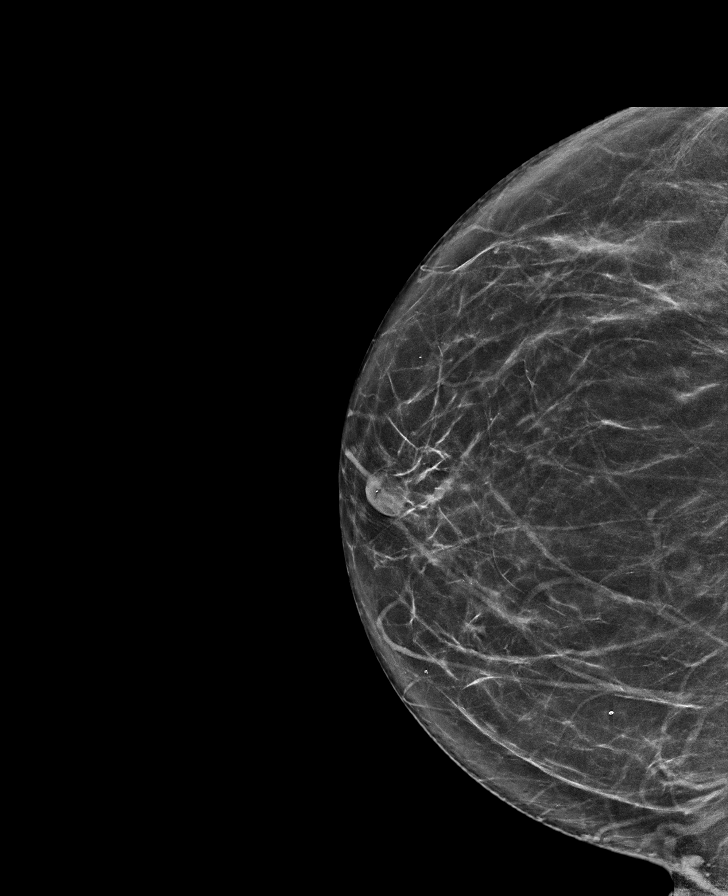

[R MLO synth-2D (2 of 2)]
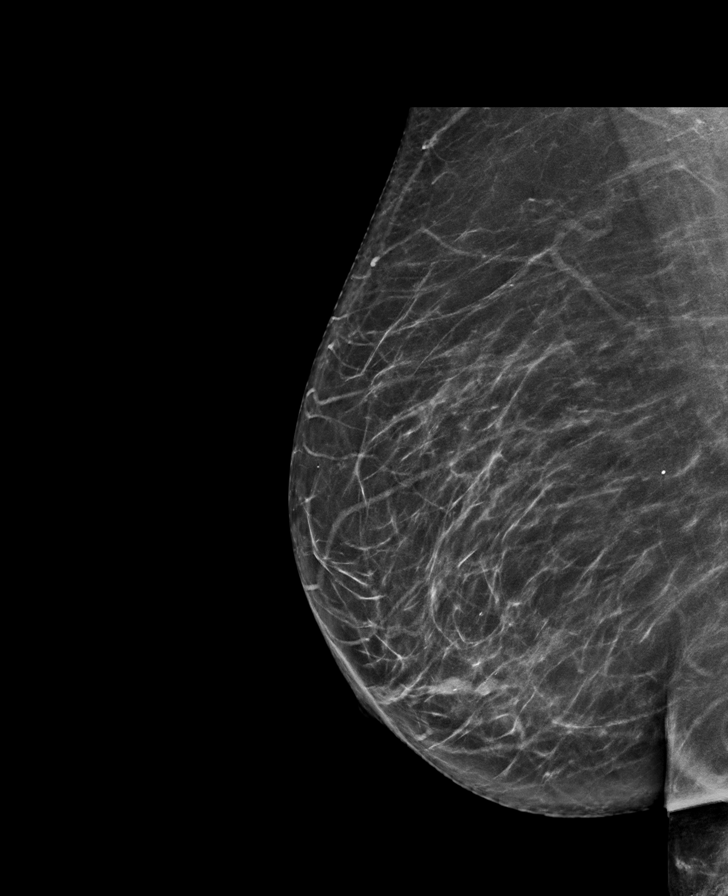

[L CC tomo · tomo slice 40/79.0]
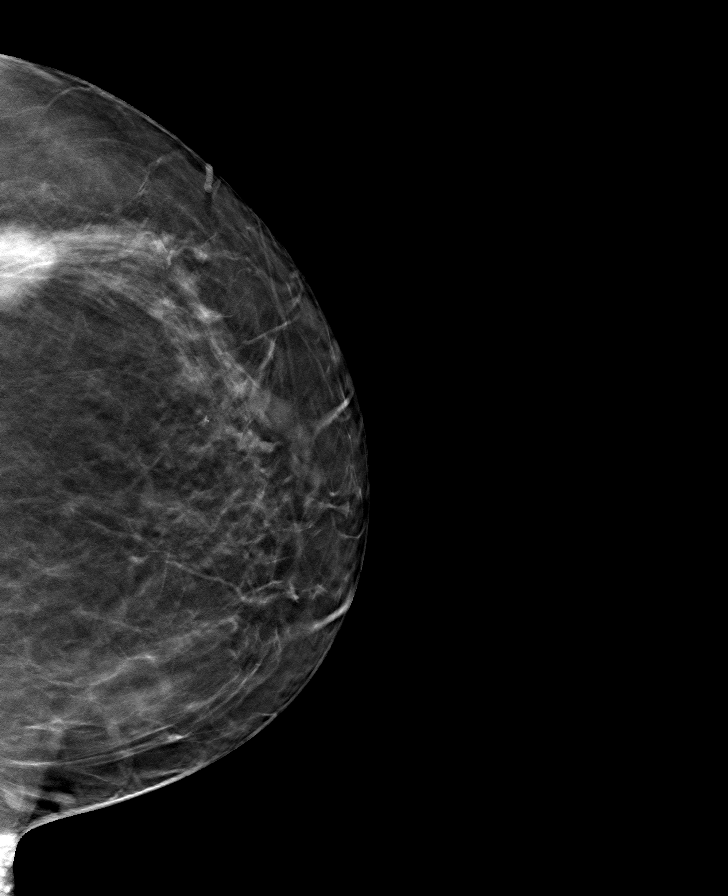

[7 of 36 positions shown; findings below may reference images not displayed]

ACR Breast Density Category b: There are scattered areas of
fibroglandular density.
FINDINGS: RIGHT breast diagnostic mammogram: There is a solitary
tubular-appearing structure within the subareolar RIGHT breast.
There are no dominant masses, suspicious calcifications or secondary
signs of malignancy elsewhere within the RIGHT breast.

LEFT breast diagnostic mammogram: There is a spiculated mass within
the outer LEFT breast, measuring approximately 2.6 cm, corresponding
to the palpable area of concern. There is a probably enlarged lymph
node in the LEFT axilla, incompletely imaged at the upper aspects of
this exam. There are no dominant masses, suspicious calcifications
or secondary signs of malignancy identified elsewhere within the
LEFT breast.

RIGHT breast: Targeted ultrasound is performed, showing a focally
dilated duct within the subareolar RIGHT breast without evidence of
intraductal mass or vascularity. No suspicious solid or cystic mass
is identified within the subareolar RIGHT breast.

LEFT breast: Targeted ultrasound is performed, showing an irregular
mass at the 3 o'clock axis, 10 cm from the nipple, measuring 2.2 x
2.0 x 2.1 cm, corresponding to the mammographic finding.

There are 2 morphologically abnormal lymph nodes identified in the
LEFT axilla.

There are no abnormal skin changes, blistering, peeling or peau
d'[REDACTED] at the RIGHT nipple.
IMPRESSION: 1. Patient describes suspicious RIGHT-sided nipple discharge which
is clear in color and spontaneous. There is a focally dilated duct
within the subareolar RIGHT breast, but no evidence of intraductal
mass or vascularity. Causes of unilateral nipple discharge include:
Hormonal changes, fibrocystic changes, benign papilloma,
abscess/mastitis, birth control pills, endocrine disorders,
injury/trauma to breast, duct ectasia, medications, prolactinoma,
and breast cancer. As is evident from this list, nipple discharge
often stems from a benign condition, however, breast cancer is a
possibility when unilateral spontaneous persistent single duct
discharge (especially bloody or clear discharge) is present. As
such, breast MRI recommended to exclude malignancy.
2. Highly suspicious mass within the LEFT breast at the 3 o'clock
axis, 10 cm from the nipple, measuring 2.2 cm. Ultrasound-guided
biopsy is recommended.
3. Two morphologically abnormal lymph nodes in the LEFT axilla,
suspicious for metastatic disease. Ultrasound-guided biopsy is
recommended for the largest of these lymph nodes.

RECOMMENDATION:
1. Breast MRI with contrast for further characterization of
patient's suspicious nipple discharge. Patient will be scheduled for
breast MRI as soon as possible.

Recommendation for breast MRI based on data provided by the [HOSPITAL] Appropriateness Criteria, produced by an expert
panel on breast imaging, showing breast MRI with contrast to have a
high sensitivity and NPV for identifying the cause of pathologic
nipple discharge.

2. Ultrasound-guided biopsies for the LEFT breast mass at the 3
o'clock axis and an enlarged lymph node in the LEFT axilla.
Ultrasound-guided biopsies will be scheduled in approximately 3
weeks to allow time for the breast MRI to be obtained before the
biopsies.

Ultrasound-guided biopsies are scheduled on [REDACTED].

I have discussed the findings and recommendations with the patient.
If applicable, a reminder letter will be sent to the patient
regarding the next appointment.

BI-RADS CATEGORY  5: Highly suggestive of malignancy.

## 2021-09-30 IMAGING — US US BREAST*L* LIMITED INC AXILLA
1 series · 12 of 13 positions shown · non-contrast
Comparison: None.

CLINICAL DATA: Patient describes a palpable lump within the LEFT
breast with associated pain. Patient also describes RIGHT-sided
nipple discharge which is clear and spontaneous.



[Series 1: us breast*left* limited inc axilla · 0.08mm/px · 12 of 13 slices shown]
[im 1/13]
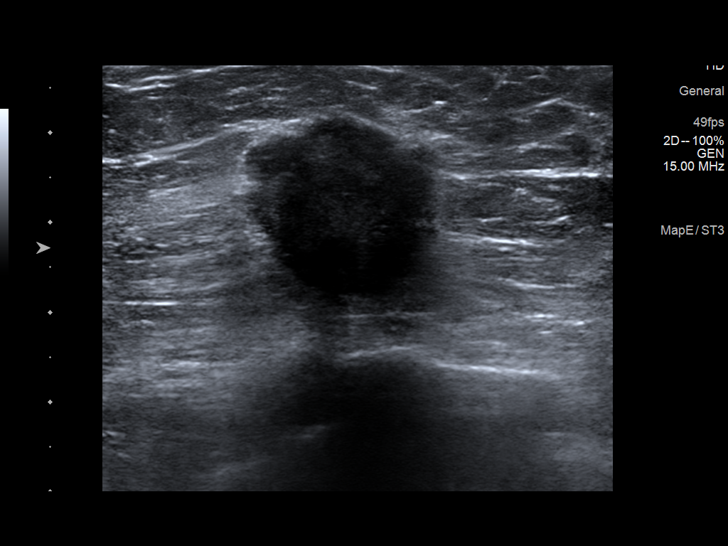
[im 2/13]
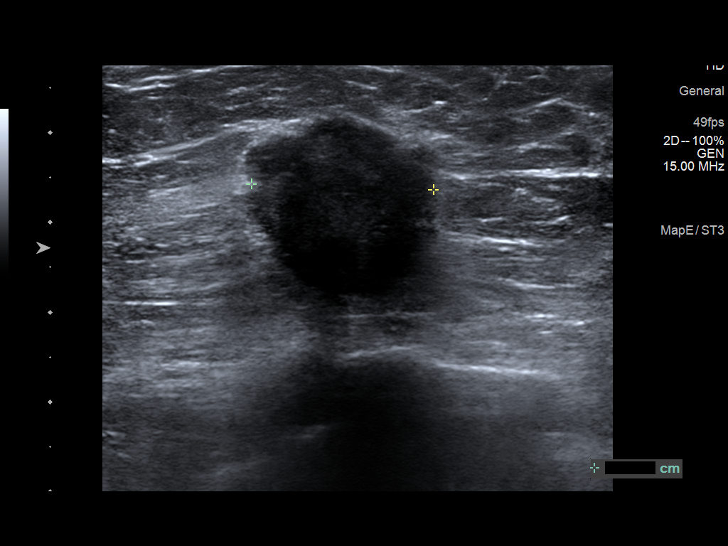
[im 3/13]
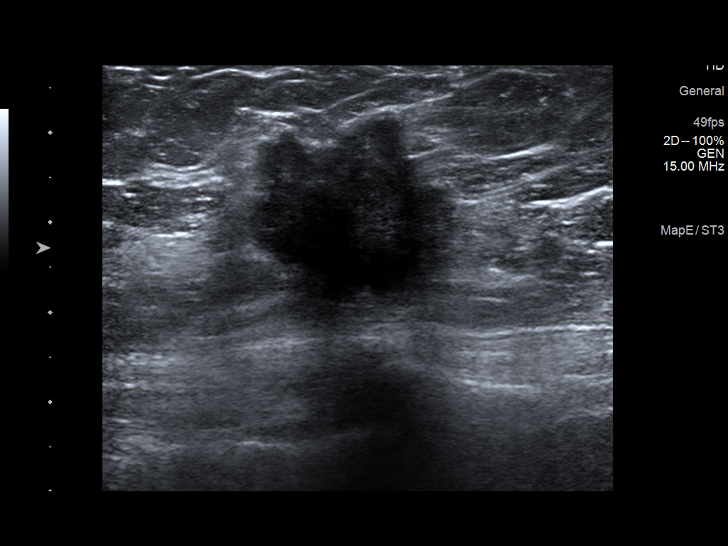
[im 4/13]
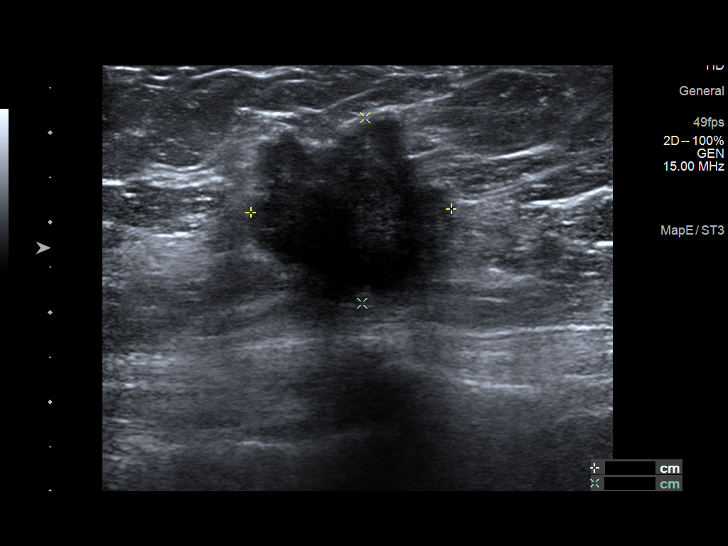
[im 5/13]
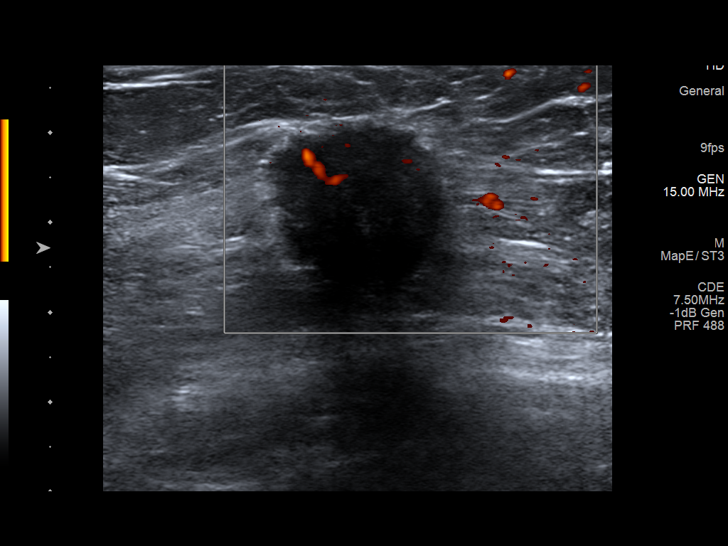
[im 6/13]
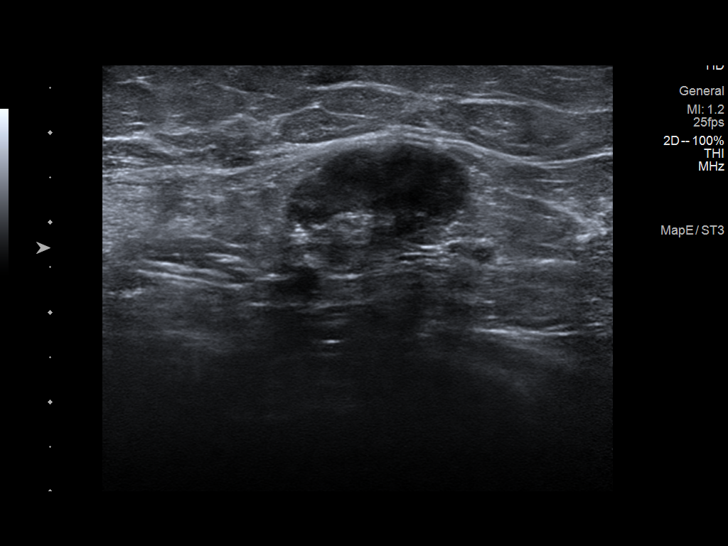
[im 8/13]
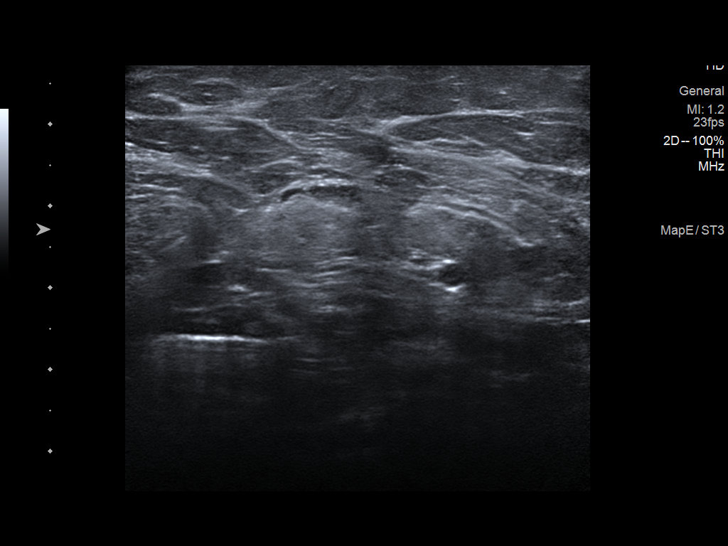
[im 9/13]
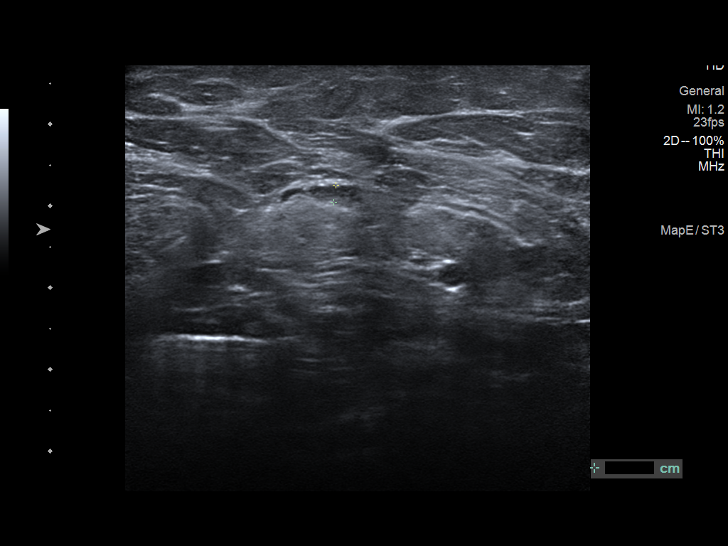
[im 10/13]
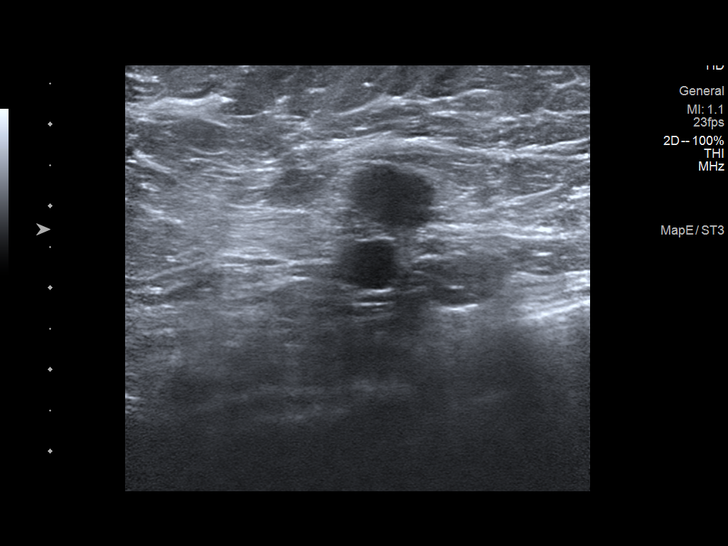
[im 11/13]
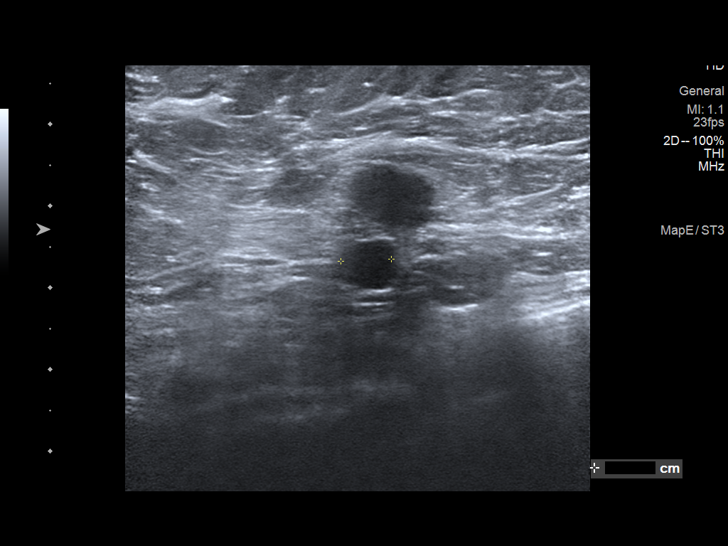
[im 12/13]
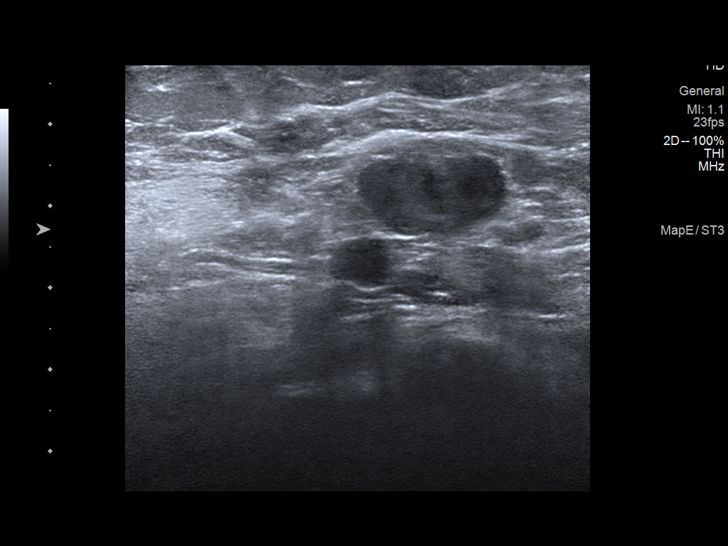
[im 13/13]
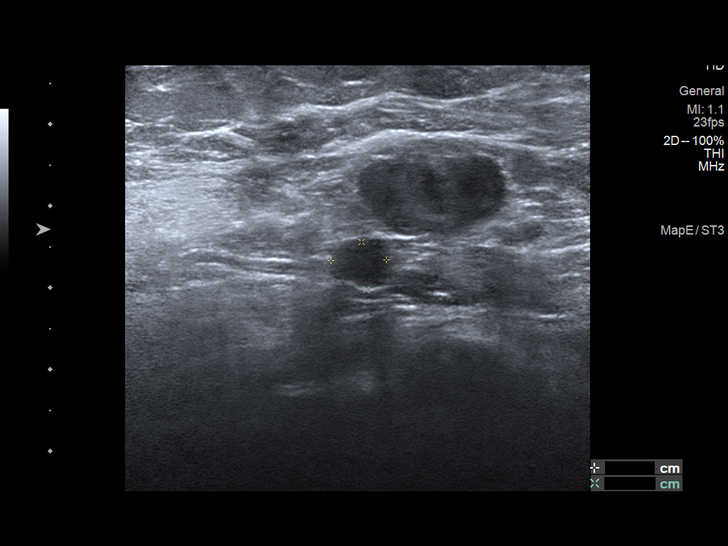

[12 of 13 positions shown; findings below may reference images not displayed]

ACR Breast Density Category b: There are scattered areas of
fibroglandular density.
FINDINGS: RIGHT breast diagnostic mammogram: There is a solitary
tubular-appearing structure within the subareolar RIGHT breast.
There are no dominant masses, suspicious calcifications or secondary
signs of malignancy elsewhere within the RIGHT breast.

LEFT breast diagnostic mammogram: There is a spiculated mass within
the outer LEFT breast, measuring approximately 2.6 cm, corresponding
to the palpable area of concern. There is a probably enlarged lymph
node in the LEFT axilla, incompletely imaged at the upper aspects of
this exam. There are no dominant masses, suspicious calcifications
or secondary signs of malignancy identified elsewhere within the
LEFT breast.

RIGHT breast: Targeted ultrasound is performed, showing a focally
dilated duct within the subareolar RIGHT breast without evidence of
intraductal mass or vascularity. No suspicious solid or cystic mass
is identified within the subareolar RIGHT breast.

LEFT breast: Targeted ultrasound is performed, showing an irregular
mass at the 3 o'clock axis, 10 cm from the nipple, measuring 2.2 x
2.0 x 2.1 cm, corresponding to the mammographic finding.

There are 2 morphologically abnormal lymph nodes identified in the
LEFT axilla.

There are no abnormal skin changes, blistering, peeling or peau
d'[REDACTED] at the RIGHT nipple.
IMPRESSION: 1. Patient describes suspicious RIGHT-sided nipple discharge which
is clear in color and spontaneous. There is a focally dilated duct
within the subareolar RIGHT breast, but no evidence of intraductal
mass or vascularity. Causes of unilateral nipple discharge include:
Hormonal changes, fibrocystic changes, benign papilloma,
abscess/mastitis, birth control pills, endocrine disorders,
injury/trauma to breast, duct ectasia, medications, prolactinoma,
and breast cancer. As is evident from this list, nipple discharge
often stems from a benign condition, however, breast cancer is a
possibility when unilateral spontaneous persistent single duct
discharge (especially bloody or clear discharge) is present. As
such, breast MRI recommended to exclude malignancy.
2. Highly suspicious mass within the LEFT breast at the 3 o'clock
axis, 10 cm from the nipple, measuring 2.2 cm. Ultrasound-guided
biopsy is recommended.
3. Two morphologically abnormal lymph nodes in the LEFT axilla,
suspicious for metastatic disease. Ultrasound-guided biopsy is
recommended for the largest of these lymph nodes.

RECOMMENDATION:
1. Breast MRI with contrast for further characterization of
patient's suspicious nipple discharge. Patient will be scheduled for
breast MRI as soon as possible.

Recommendation for breast MRI based on data provided by the [HOSPITAL] Appropriateness Criteria, produced by an expert
panel on breast imaging, showing breast MRI with contrast to have a
high sensitivity and NPV for identifying the cause of pathologic
nipple discharge.

2. Ultrasound-guided biopsies for the LEFT breast mass at the 3
o'clock axis and an enlarged lymph node in the LEFT axilla.
Ultrasound-guided biopsies will be scheduled in approximately 3
weeks to allow time for the breast MRI to be obtained before the
biopsies.

Ultrasound-guided biopsies are scheduled on [REDACTED].

I have discussed the findings and recommendations with the patient.
If applicable, a reminder letter will be sent to the patient
regarding the next appointment.

BI-RADS CATEGORY  5: Highly suggestive of malignancy.

## 2021-10-01 ENCOUNTER — Other Ambulatory Visit: Payer: Self-pay | Admitting: Family Medicine

## 2021-10-01 DIAGNOSIS — N6452 Nipple discharge: Secondary | ICD-10-CM

## 2021-10-08 ENCOUNTER — Other Ambulatory Visit: Payer: Self-pay | Admitting: Family Medicine

## 2021-10-08 DIAGNOSIS — R599 Enlarged lymph nodes, unspecified: Secondary | ICD-10-CM

## 2021-10-08 DIAGNOSIS — N6320 Unspecified lump in the left breast, unspecified quadrant: Secondary | ICD-10-CM

## 2021-10-14 ENCOUNTER — Other Ambulatory Visit: Payer: Medicare Other

## 2021-10-21 ENCOUNTER — Other Ambulatory Visit: Payer: Medicare Other

## 2021-10-21 ENCOUNTER — Other Ambulatory Visit (HOSPITAL_COMMUNITY): Payer: Self-pay | Admitting: Family Medicine

## 2021-10-21 ENCOUNTER — Other Ambulatory Visit: Payer: Self-pay | Admitting: Family Medicine

## 2021-10-21 DIAGNOSIS — E221 Hyperprolactinemia: Secondary | ICD-10-CM

## 2021-10-22 ENCOUNTER — Other Ambulatory Visit (HOSPITAL_COMMUNITY): Payer: Self-pay | Admitting: Family Medicine

## 2021-10-22 DIAGNOSIS — R0600 Dyspnea, unspecified: Secondary | ICD-10-CM

## 2021-11-04 ENCOUNTER — Ambulatory Visit (HOSPITAL_COMMUNITY)
Admission: RE | Admit: 2021-11-04 | Discharge: 2021-11-04 | Disposition: A | Discharge status: Home / Self Care | Payer: Medicare Other | Source: Ambulatory Visit | Attending: Family Medicine | Admitting: Family Medicine

## 2021-11-04 DIAGNOSIS — E221 Hyperprolactinemia: Secondary | ICD-10-CM | POA: Insufficient documentation

## 2021-11-04 IMAGING — MR MR HEAD WO/W CM
8 of 20 series · 22 of 48 positions shown · IV contrast (cc GAD)
Comparison: Head CT [DATE]

CLINICAL DATA: Hyperprolactinemia.

EXAM:
MRI HEAD WITHOUT AND WITH CONTRAST
TECHNIQUE: Multiplanar, multiecho pulse sequences of the brain and surrounding
structures were obtained without and with intravenous contrast.
CONTRAST:  3mL GADAVIST GADOBUTROL 1 MMOL/ML IV SOLN

[Series 2: DWI · axial · 3.0mm · 0.94mm/px · z∈[-73,+73]mm · 8 of 100 slices shown]
[im 1/100]
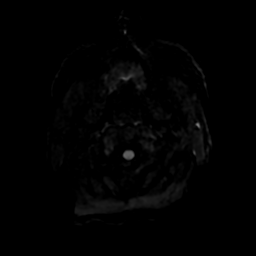
[im 15/100]
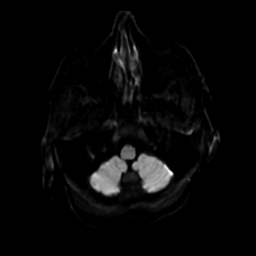
[im 29/100]
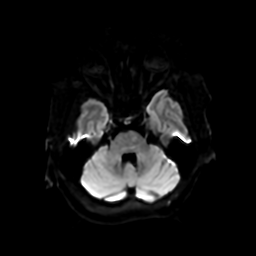
[im 43/100]
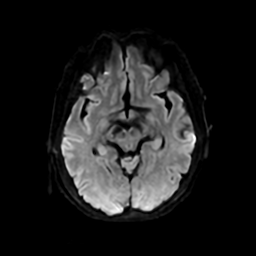
[im 57/100]
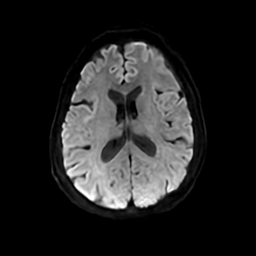
[im 71/100]
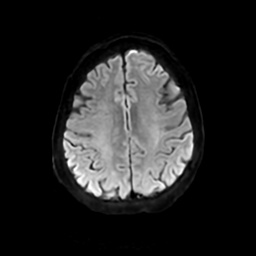
[im 85/100]
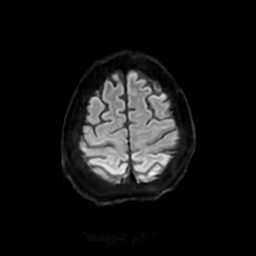
[im 100/100]
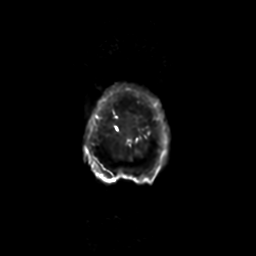

[Series 3: FLAIR · sagittal · 5.0mm · 0.47mm/px · 2 of 23 slices shown (1 of 2)]
[im 1/23]
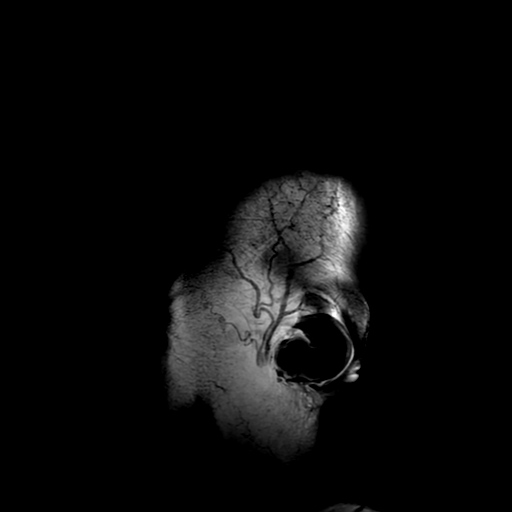
[im 23/23]
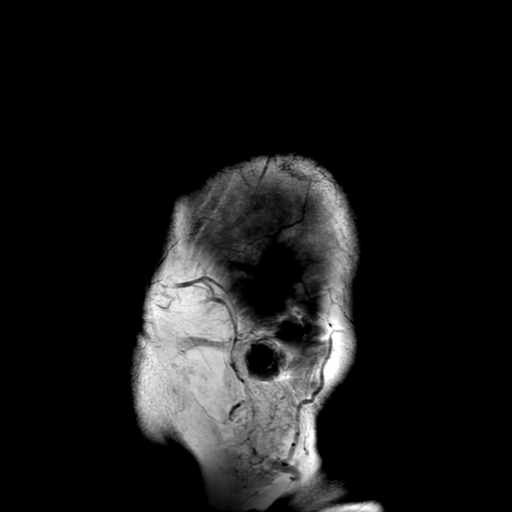

[Series 4: T2 · axial · 5.0mm · 0.23mm/px · z∈[-71,+72]mm · 2 of 25 slices shown (1 of 2)]
[im 1/25]
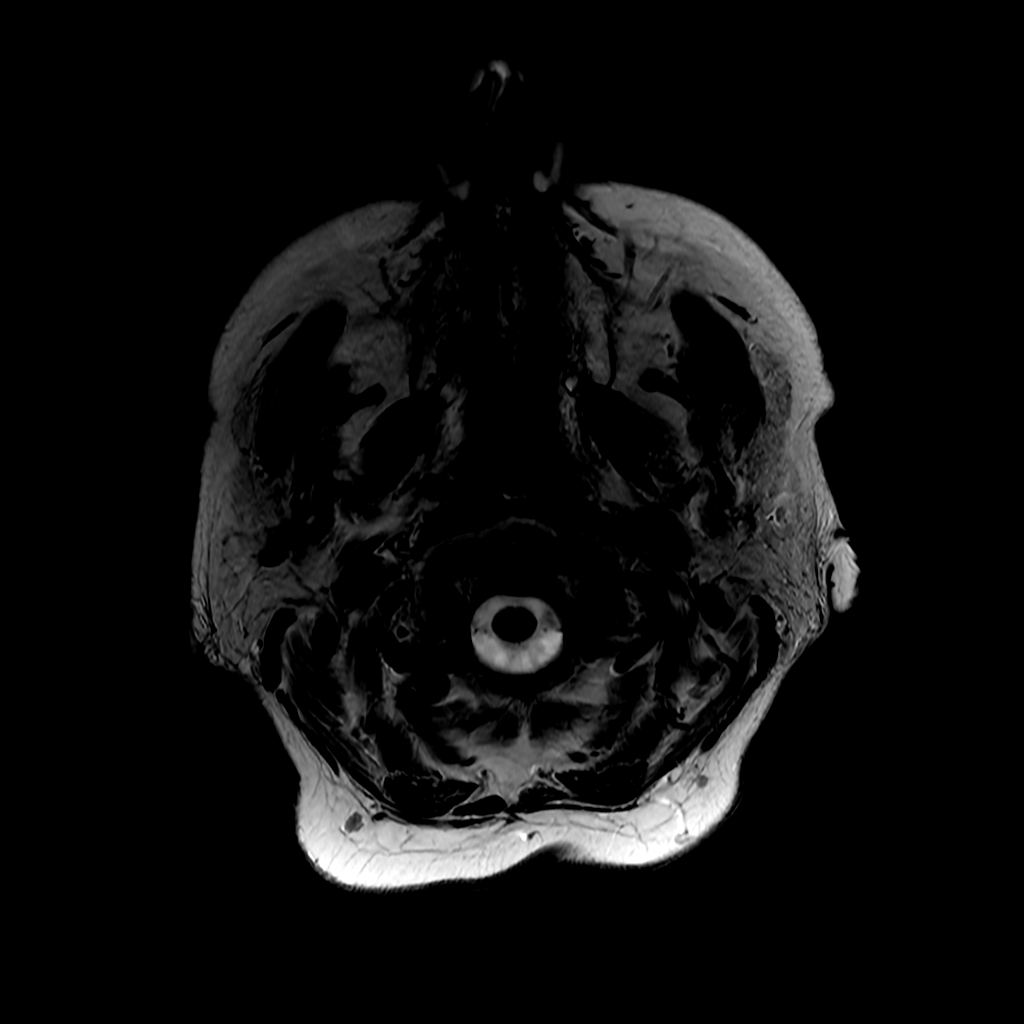
[im 25/25]
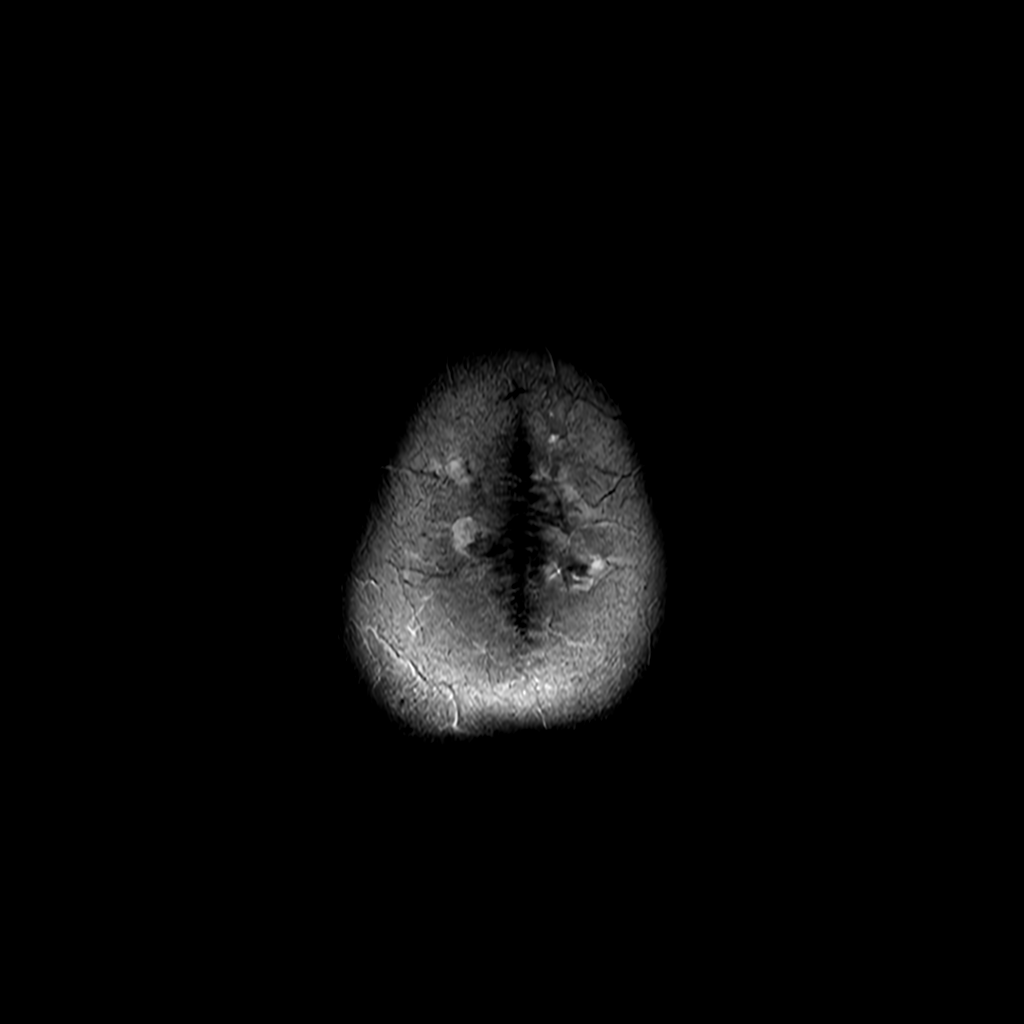

[Series 5: FLAIR · axial · 4.0mm · 0.45mm/px · z∈[-73,+75]mm · 3 of 35 slices shown (2 of 2)]
[im 1/35]
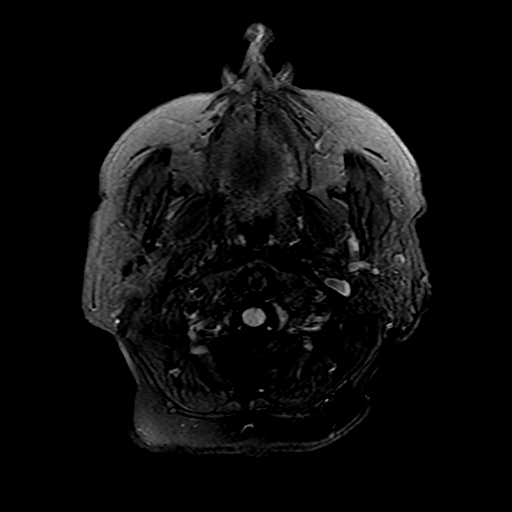
[im 18/35]
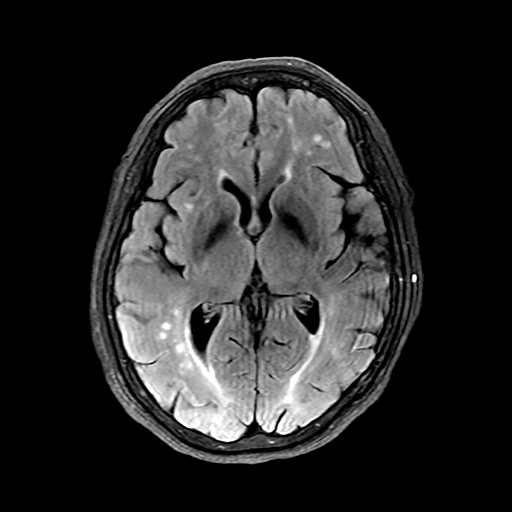
[im 35/35]
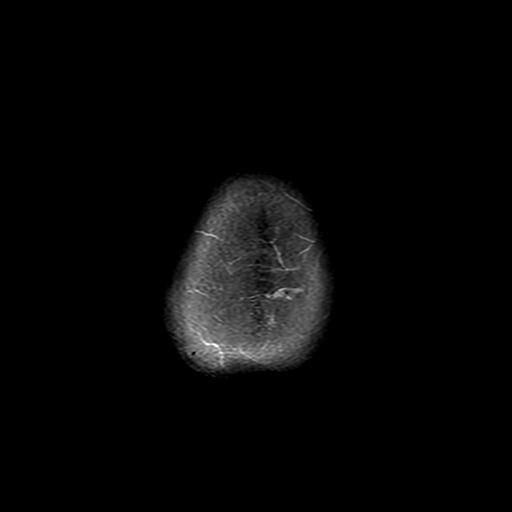

[Series 10: T2 · coronal · 3.0mm · 0.35mm/px · 1 of 16 slices shown (2 of 2)]
[im 1/16]
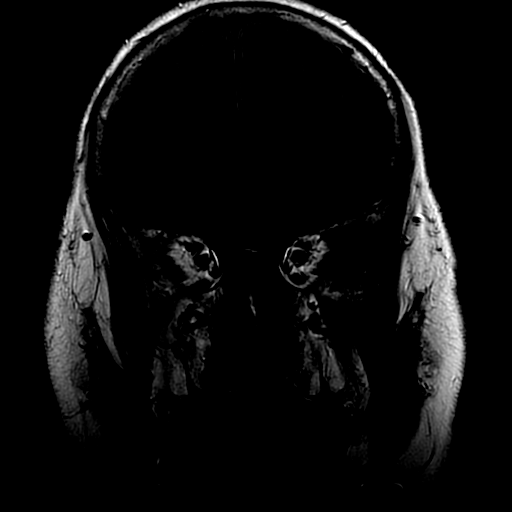

[Series 12: T1 post-contrast · sagittal · 3.0mm · 0.35mm/px · 1 of 15 slices shown (1 of 2)]
[im 1/15]
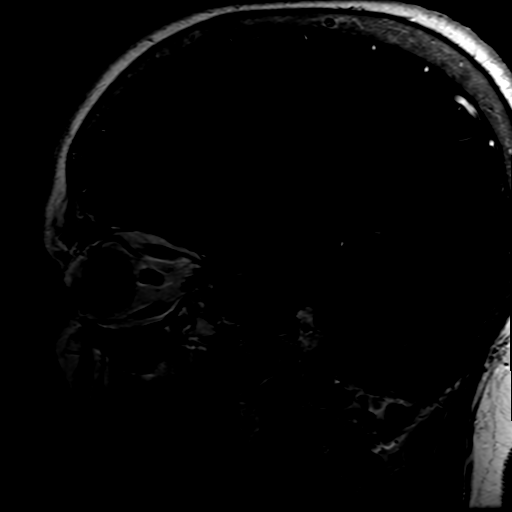

[Series 13: T1 post-contrast · coronal · 3.0mm · 0.35mm/px · 1 of 16 slices shown (2 of 2)]
[im 1/16]
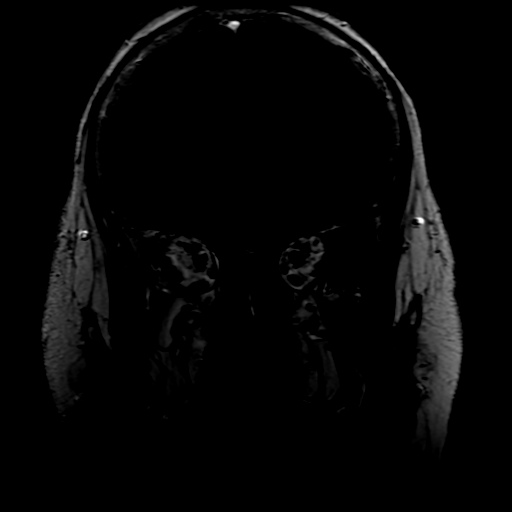

[Series 250: ADC · axial · 3.0mm · 0.94mm/px · z∈[-73,+73]mm · 4 of 48 slices shown]
[im 1/48]
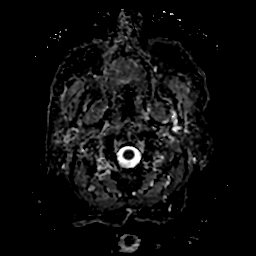
[im 16/48]
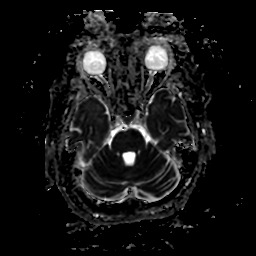
[im 32/48]
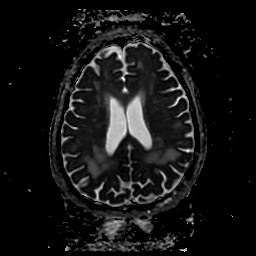
[im 48/48]
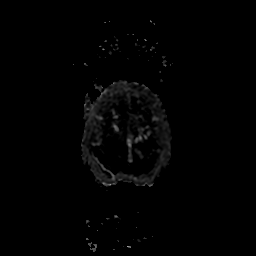

[22 of 48 positions shown; findings below may reference images not displayed]

FINDINGS: Brain: There is no evidence of an acute infarct, intracranial
hemorrhage, mass, midline shift, or extra-axial fluid collection.
Patchy and early confluent T2 hyperintensities are present in the
cerebral white matter bilaterally. The ventricles and sulci are
normal.

Dedicated imaging was performed through the sella turcica. The
pituitary gland is normal in size. Enhancement in the far left
lateral aspect of the gland is heterogeneous, and a discrete 4 mm
hypoenhancing lesion is questioned on dynamic postcontrast images
(for example series [2I], image 4). The infundibulum is midline. The
cavernous sinuses and optic chiasm are unremarkable.

Vascular: Major intracranial vascular flow voids are preserved.

Skull and upper cervical spine: Unremarkable bone marrow signal.

Sinuses/Orbits: Bilateral cataract extraction. Paranasal sinuses and
mastoid air cells are clear.

Other: None.
IMPRESSION: 1. Possible 4 mm lesion in the far left lateral aspect of the
pituitary gland which may reflect a microadenoma.
2. Severely age advanced cerebral white matter T2 signal changes,
nonspecific though most often seen with chronic small vessel
ischemia.

## 2021-11-04 MED ORDER — GADOBUTROL 1 MMOL/ML IV SOLN
3.0000 mL | Freq: Once | INTRAVENOUS | Status: AC | PRN
  Administered 2021-11-04: 3 mL via INTRAVENOUS

## 2021-11-05 ENCOUNTER — Ambulatory Visit
Admission: RE | Admit: 2021-11-05 | Discharge: 2021-11-05 | Disposition: A | Discharge status: Home / Self Care | Payer: Medicaid Other | Source: Ambulatory Visit | Attending: Family Medicine | Admitting: Family Medicine

## 2021-11-05 ENCOUNTER — Ambulatory Visit
Admission: RE | Admit: 2021-11-05 | Discharge: 2021-11-05 | Disposition: A | Discharge status: Home / Self Care | Payer: Medicare Other | Source: Ambulatory Visit | Attending: Family Medicine | Admitting: Family Medicine

## 2021-11-05 DIAGNOSIS — N6320 Unspecified lump in the left breast, unspecified quadrant: Secondary | ICD-10-CM

## 2021-11-05 DIAGNOSIS — R599 Enlarged lymph nodes, unspecified: Secondary | ICD-10-CM

## 2021-11-05 IMAGING — US US BREAST BX W LOC DEV 1ST LESION IMG BX SPEC US GUIDE*L*
1 series · 12 of 24 positions shown · non-contrast
Comparison: None Available.
COMPARISON: None Available.

Addendum:
CLINICAL DATA: Biopsy of a 3 o'clock left breast mass and an
abnormal left axillary lymph node.

EXAM:
ULTRASOUND GUIDED LEFT BREAST CORE NEEDLE BIOPSY

[Series 1: us breast bx w loc dev 1st lesion img bx spec us g · 0.07mm/px · 12 of 24 slices shown]
[im 2/24]
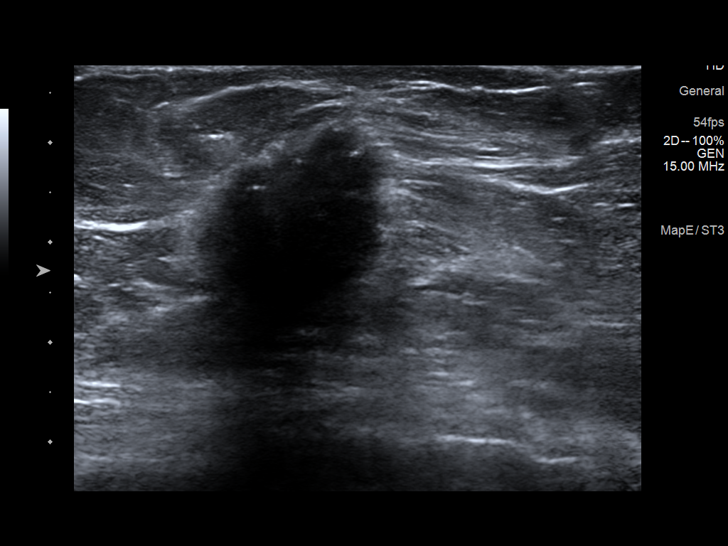
[im 4/24]
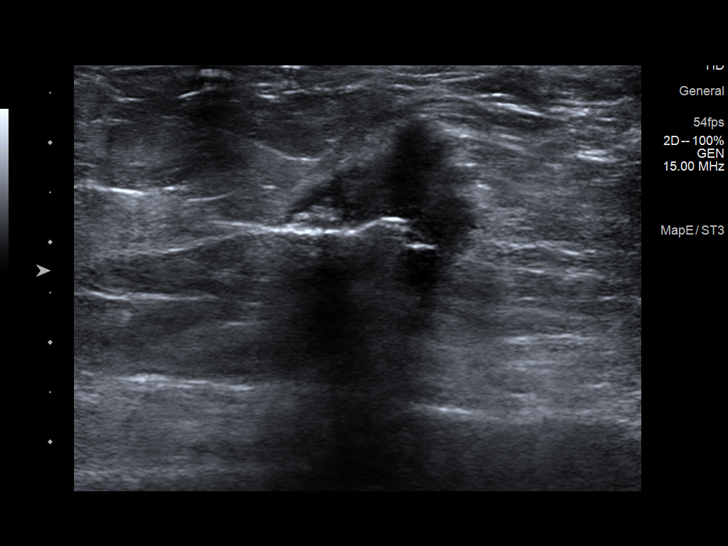
[im 6/24]
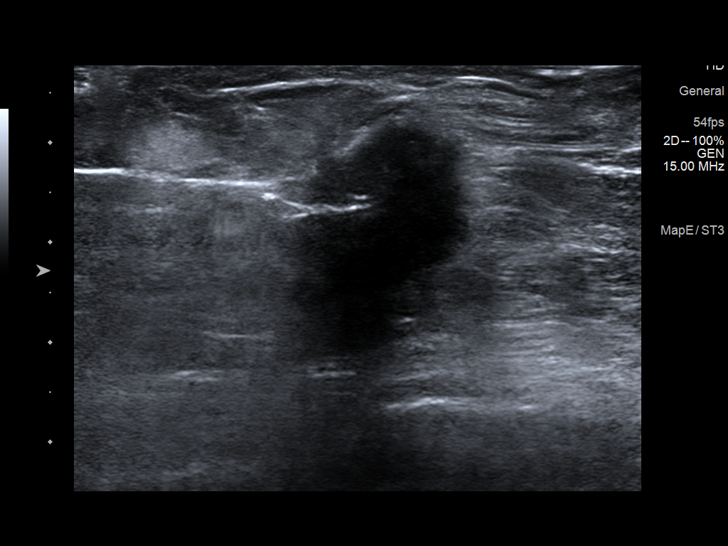
[im 8/24]
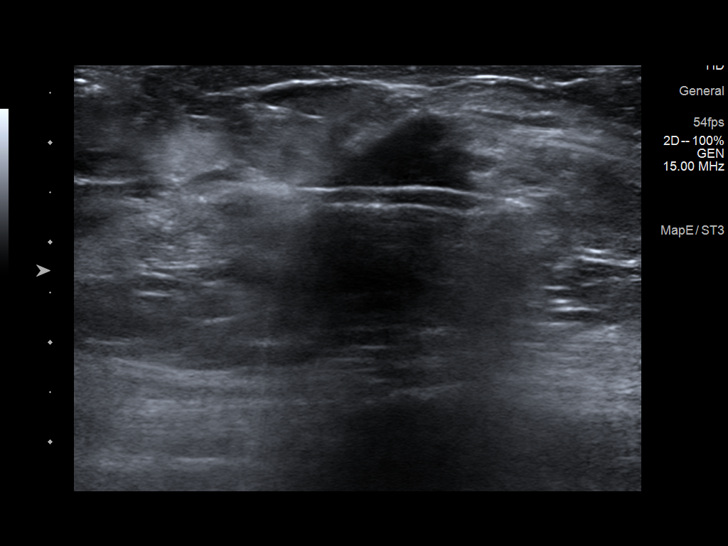
[im 10/24]
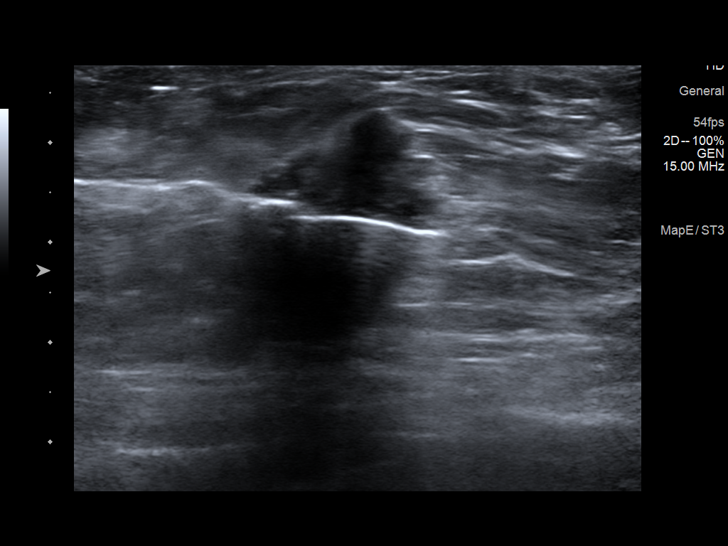
[im 12/24]
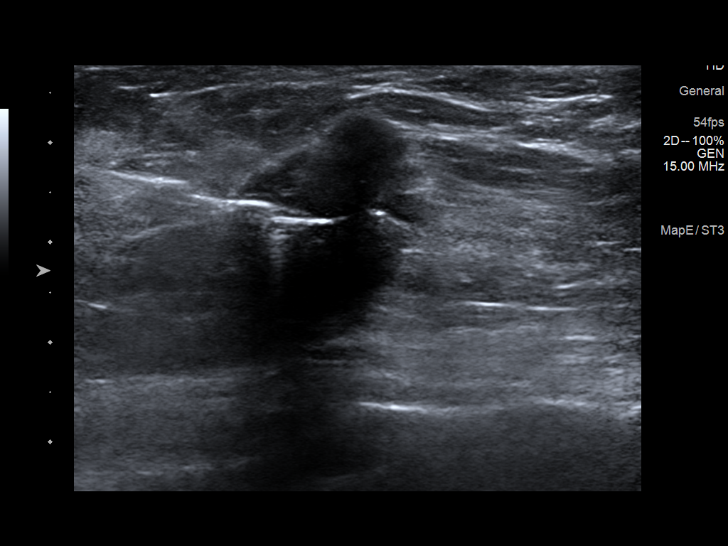
[im 14/24]
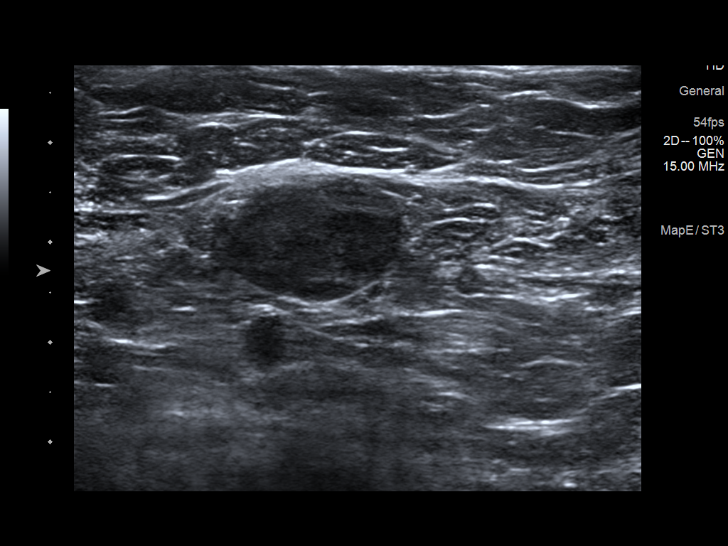
[im 16/24]
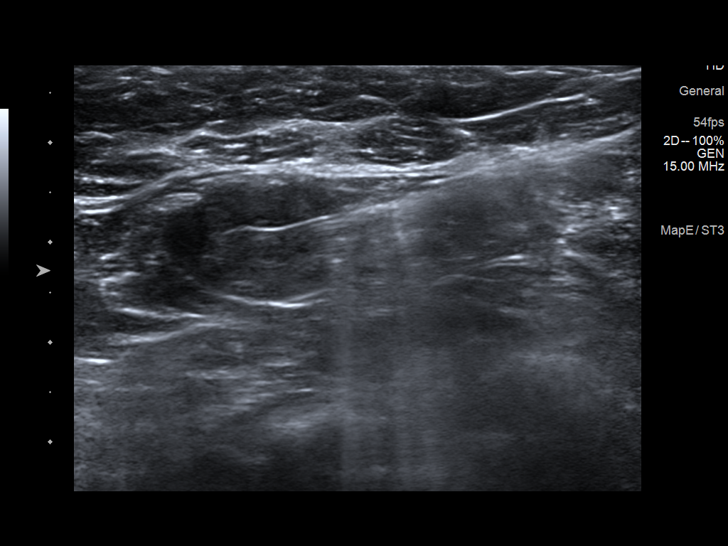
[im 18/24]
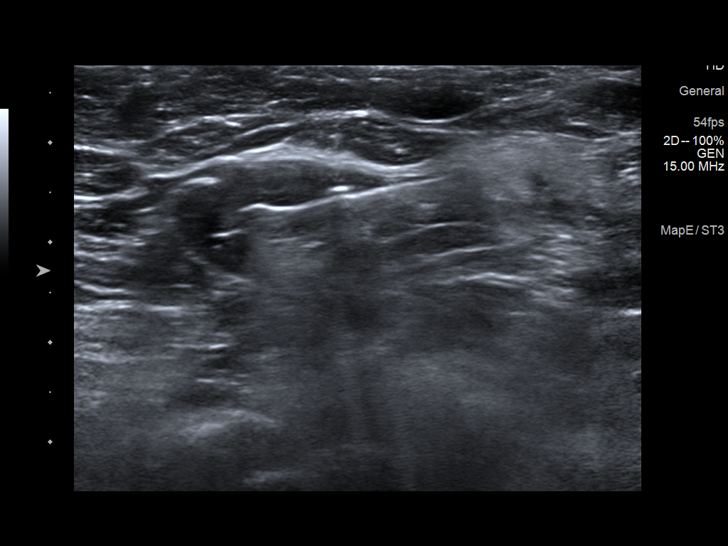
[im 20/24]
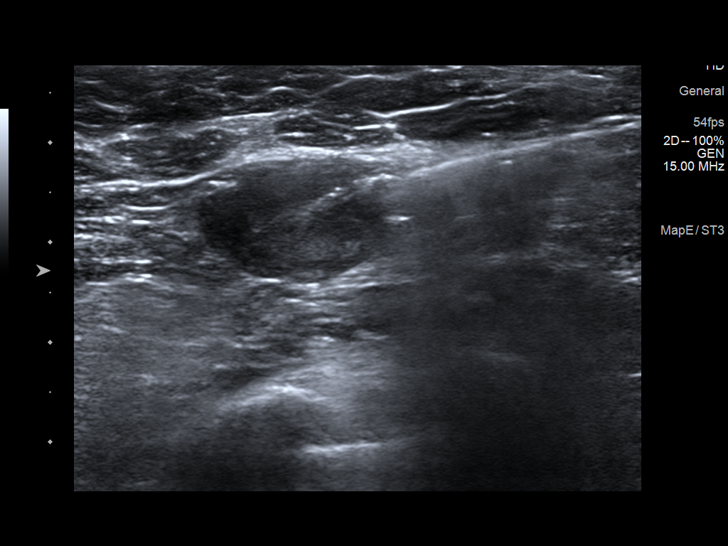
[im 22/24]
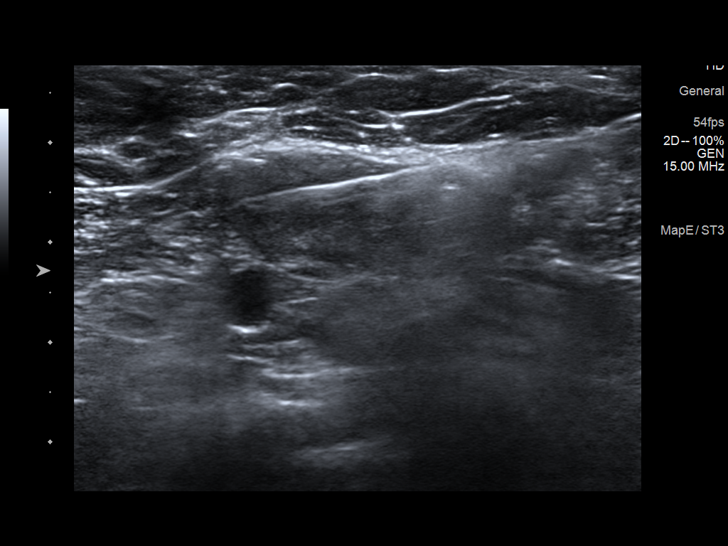
[im 24/24]
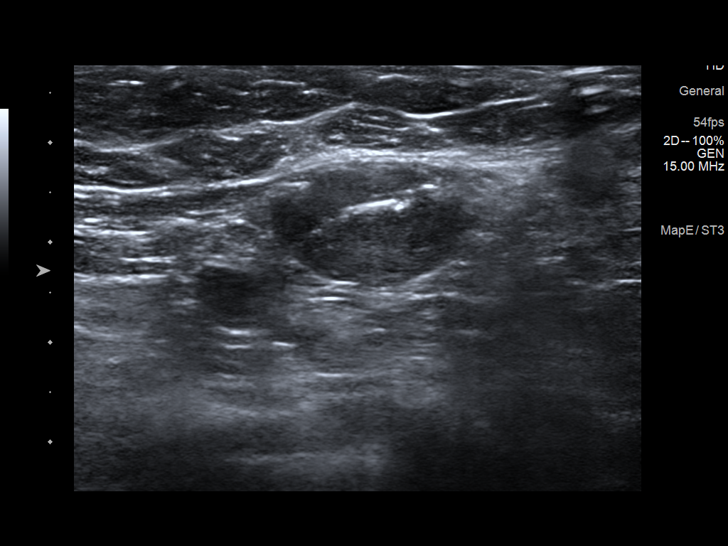

[12 of 24 positions shown; findings below may reference images not displayed]



Lesion quadrant: 3 o'clock breast

Using sterile technique and 1% Lidocaine as local anesthetic, under
direct ultrasound visualization, a 12 gauge COPENHAVER device was
used to perform biopsy of a 3 o'clock left breast mass using a
lateral approach. At the conclusion of the procedure a ribbon shaped
tissue marker clip was deployed into the biopsy cavity. Follow up 2
view mammogram was performed and dictated separately.

Lesion quadrant: Left axilla

Using sterile technique and 1% Lidocaine as local anesthetic, under
direct ultrasound visualization, a 14 gauge COPENHAVER device was
used to perform biopsy of an abnormal left axillary lymph node using
a lateral approach. At the conclusion of the procedure a spiral
shaped HydroMARK tissue marker clip was deployed into the biopsy
cavity. Follow up 2 view mammogram was performed and dictated
separately.
IMPRESSION: Ultrasound guided biopsy of a left breast mass and an abnormal left
axillary lymph node. No apparent complications.

ADDENDUM:
Pathology revealed GRADE II INVASIVE MAMMARY CARCINOMA of the LEFT
breast, 3 o'clock, (ribbon clip). E-cadherin is POSITIVE supporting
a ductal origin. This was found to be concordant by Dr. COPENHAVER
COPENHAVER.

Pathology revealed INVASIVE DUCTAL CARCINOMA, NO NODAL TISSUE
IDENTIFIED of the LEFT axilla, (hydromark clip). This was found to
be concordant by Dr. COPENHAVER.

Pathology results were discussed with the patient and her COPENHAVER by telephone, per request. The patient reported doing
well after the biopsies with tenderness at the sites. Post biopsy
instructions and care were reviewed and questions were answered. The
patient was encouraged to call [REDACTED] for any additional concerns. My direct phone number was
provided.

Surgical consultation has been arranged with Dr. COPENHAVER at
[REDACTED] on [DATE].

Recommendation for breast MRI with contrast prior to surgery for
further characterization of patient's suspicious RIGHT nipple
discharge.

Pathology results reported by COPENHAVER, RN on [DATE].



Lesion quadrant: 3 o'clock breast

Using sterile technique and 1% Lidocaine as local anesthetic, under
direct ultrasound visualization, a 12 gauge COPENHAVER device was
used to perform biopsy of a 3 o'clock left breast mass using a
lateral approach. At the conclusion of the procedure a ribbon shaped
tissue marker clip was deployed into the biopsy cavity. Follow up 2
view mammogram was performed and dictated separately.

Lesion quadrant: Left axilla

Using sterile technique and 1% Lidocaine as local anesthetic, under
direct ultrasound visualization, a 14 gauge COPENHAVER device was
used to perform biopsy of an abnormal left axillary lymph node using
a lateral approach. At the conclusion of the procedure a spiral
shaped HydroMARK tissue marker clip was deployed into the biopsy
cavity. Follow up 2 view mammogram was performed and dictated
separately.
IMPRESSION: Ultrasound guided biopsy of a left breast mass and an abnormal left
axillary lymph node. No apparent complications.

## 2021-11-07 ENCOUNTER — Ambulatory Visit (HOSPITAL_COMMUNITY): Payer: Medicare Other

## 2021-11-12 ENCOUNTER — Other Ambulatory Visit: Payer: Self-pay | Admitting: General Surgery

## 2021-11-12 DIAGNOSIS — C50412 Malignant neoplasm of upper-outer quadrant of left female breast: Secondary | ICD-10-CM

## 2021-11-12 DIAGNOSIS — C50912 Malignant neoplasm of unspecified site of left female breast: Secondary | ICD-10-CM

## 2021-11-14 ENCOUNTER — Ambulatory Visit
Admission: RE | Admit: 2021-11-14 | Discharge: 2021-11-14 | Disposition: A | Discharge status: Home / Self Care | Payer: Medicaid Other | Source: Ambulatory Visit | Attending: Family Medicine | Admitting: Family Medicine

## 2021-11-14 DIAGNOSIS — K709 Alcoholic liver disease, unspecified: Secondary | ICD-10-CM

## 2021-11-14 DIAGNOSIS — F1014 Alcohol abuse with alcohol-induced mood disorder: Secondary | ICD-10-CM

## 2021-11-14 IMAGING — US US ABDOMEN LIMITED
1 series · 14 of 25 positions shown · non-contrast
Comparison: None Available.

CLINICAL DATA: History of alcoholic liver disease.

EXAM:
ULTRASOUND ABDOMEN LIMITED RIGHT UPPER QUADRANT

[Series 1: us abdomen limited · 0.18mm/px · 14 of 45 slices shown]
[im 1/45]
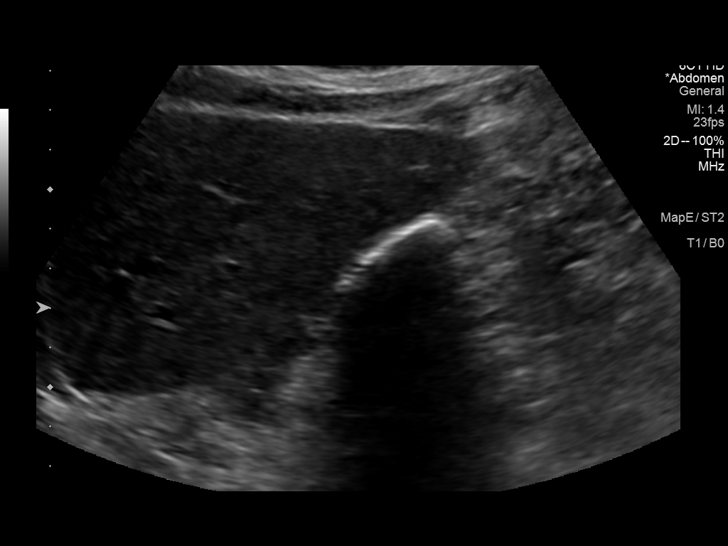
[im 4/45]
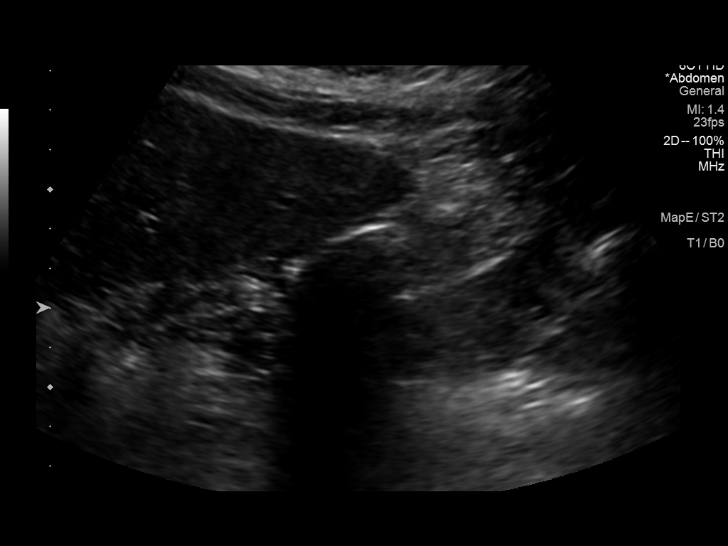
[im 8/45]
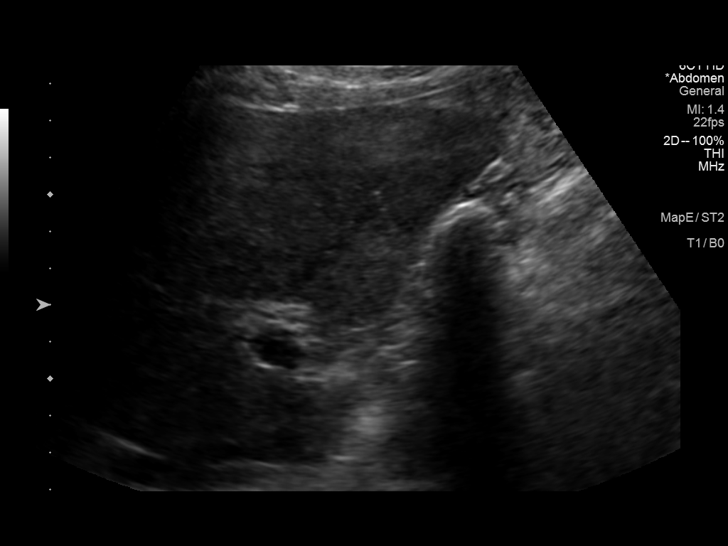
[im 12/45]
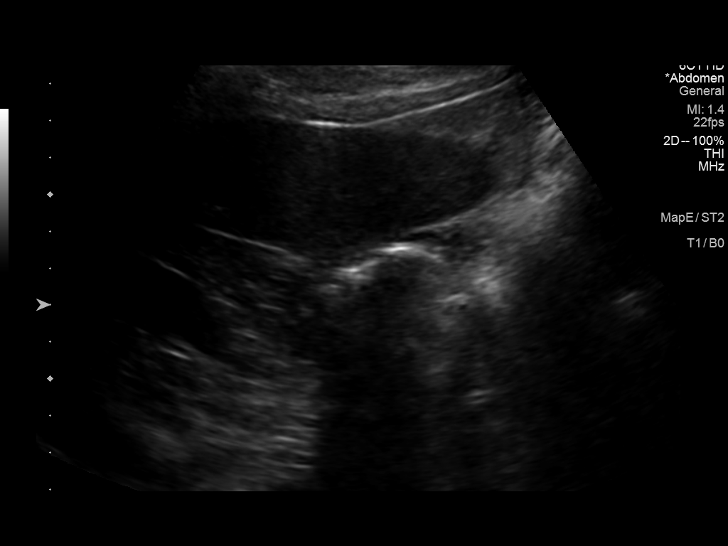
[im 15/45]
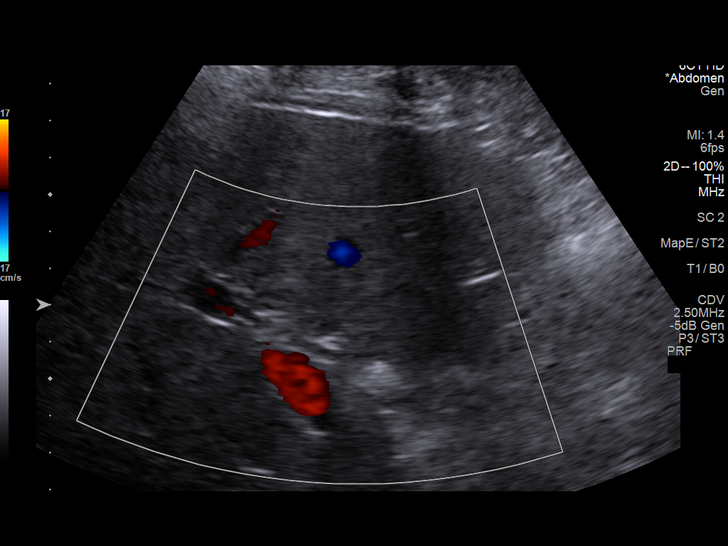
[im 17/45]
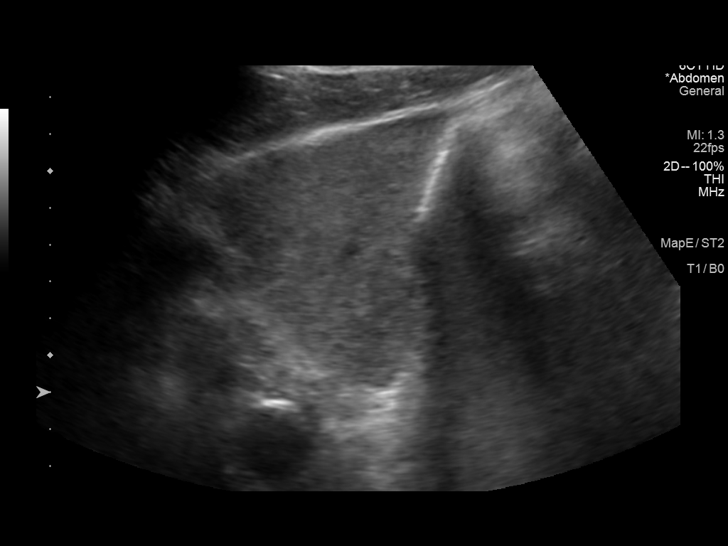
[im 21/45]
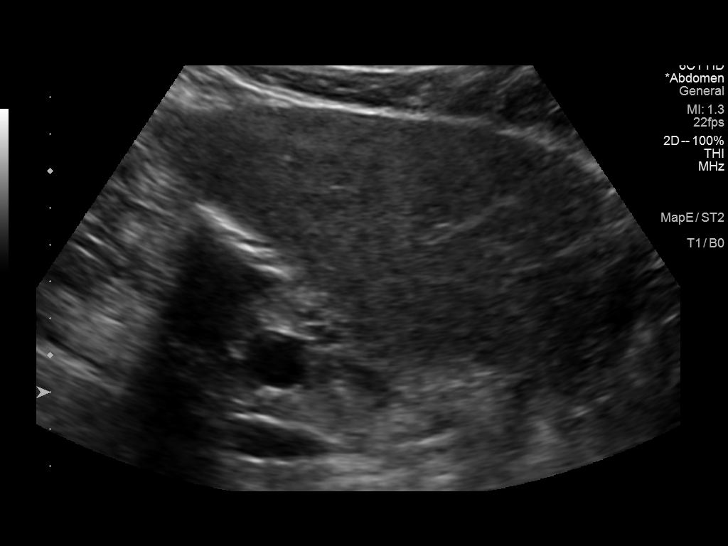
[im 24/45]
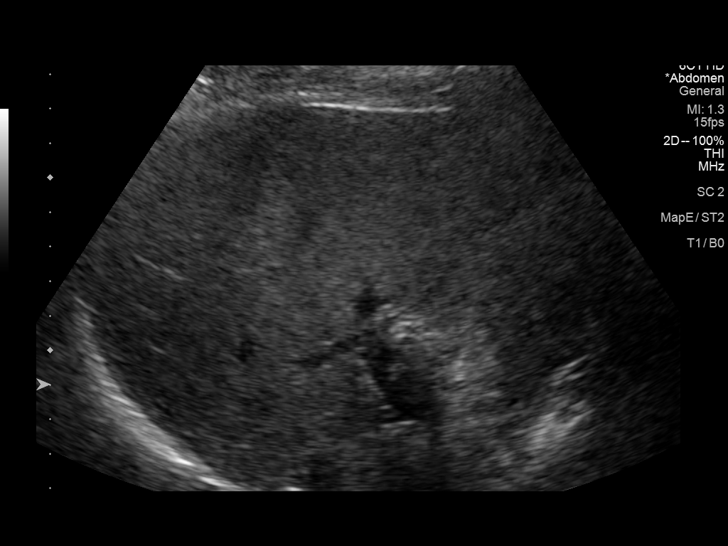
[im 28/45]
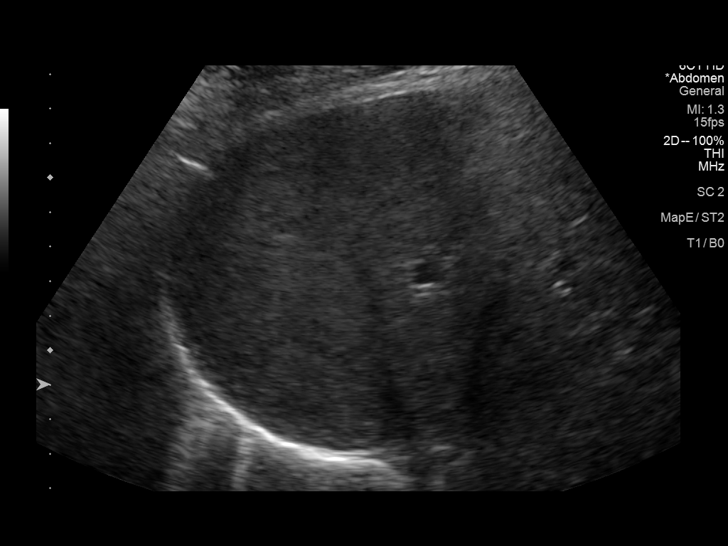
[im 30/45]
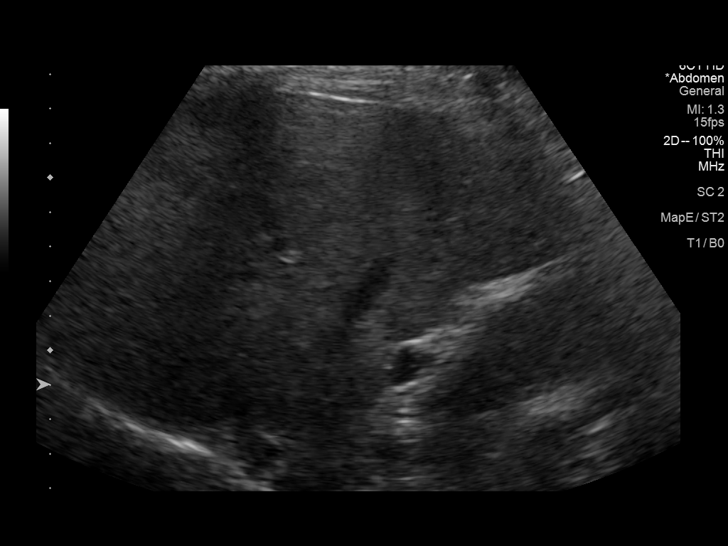
[im 34/45]
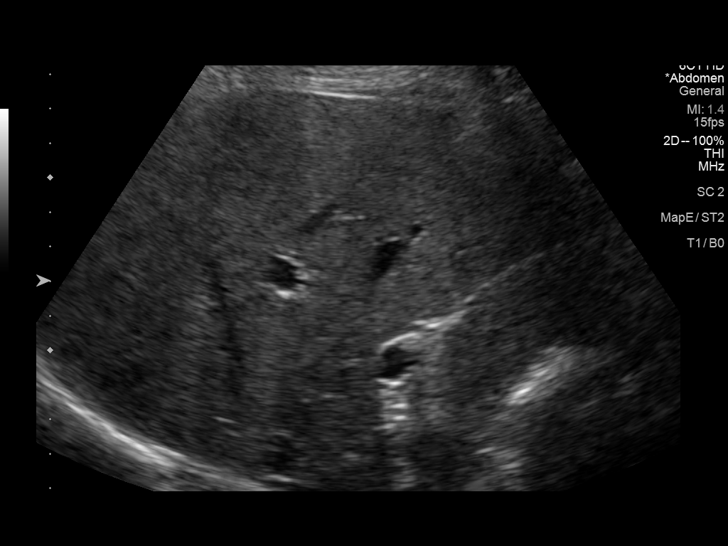
[im 37/45]
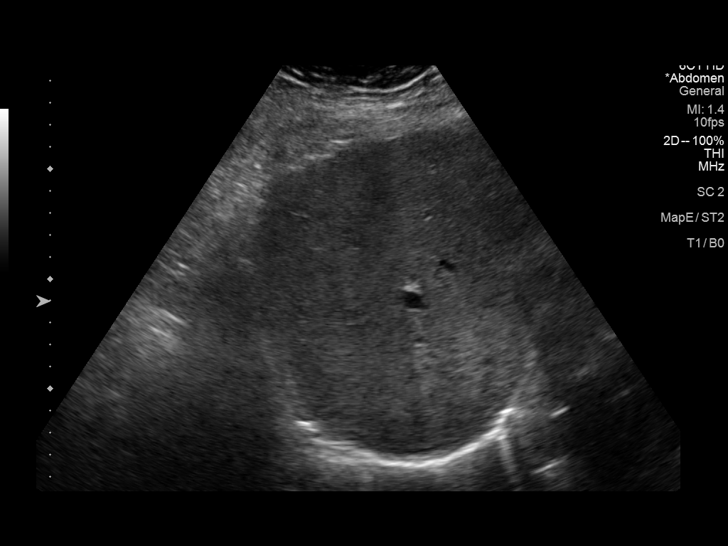
[im 41/45]
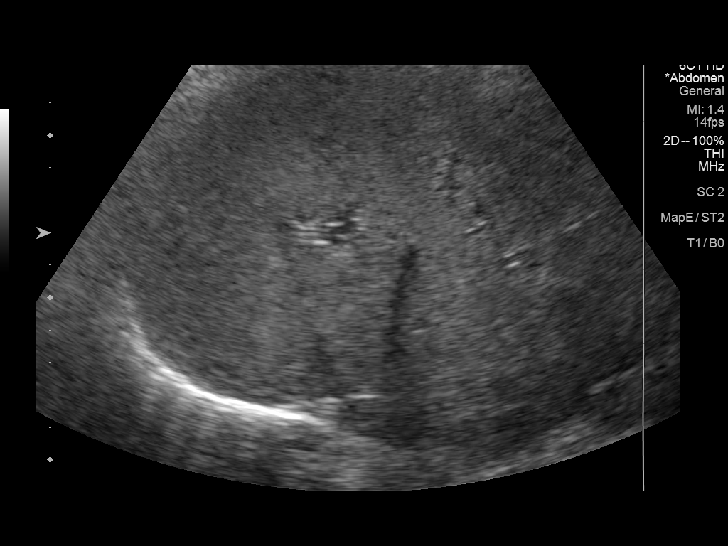
[im 45/45]
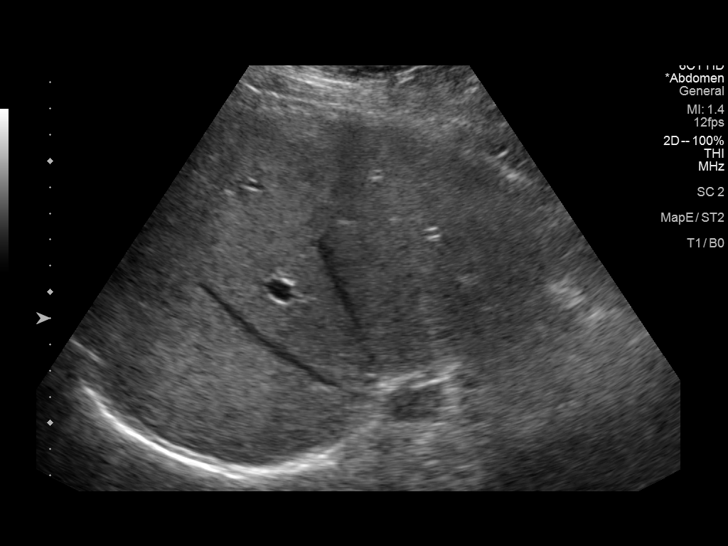

[14 of 25 positions shown; findings below may reference images not displayed]

FINDINGS: Gallbladder:

Multiple stones in the gallbladder lumen. No gallbladder wall
thickening or pericholecystic fluid. Negative sonographic Murphy's
sign.

Common bile duct:

Diameter: 3.6 mm

Liver:

Increased echogenicity. No focal lesion. Portal vein is patent on
color Doppler imaging with normal direction of blood flow towards
the liver.

Other: None.
IMPRESSION: Cholelithiasis without secondary signs of acute cholecystitis.

Increased hepatic parenchymal echogenicity suggestive of steatosis.

## 2021-11-17 ENCOUNTER — Other Ambulatory Visit (HOSPITAL_BASED_OUTPATIENT_CLINIC_OR_DEPARTMENT_OTHER): Payer: Self-pay | Admitting: Family Medicine

## 2021-11-17 DIAGNOSIS — Z0000 Encounter for general adult medical examination without abnormal findings: Secondary | ICD-10-CM

## 2021-11-17 DIAGNOSIS — Z13820 Encounter for screening for osteoporosis: Secondary | ICD-10-CM

## 2021-11-19 ENCOUNTER — Telehealth: Payer: Self-pay | Admitting: Radiation Oncology

## 2021-11-19 ENCOUNTER — Other Ambulatory Visit: Payer: Self-pay | Admitting: Family Medicine

## 2021-11-19 ENCOUNTER — Ambulatory Visit (HOSPITAL_COMMUNITY): Payer: Medicare Other

## 2021-11-19 ENCOUNTER — Encounter (HOSPITAL_COMMUNITY): Payer: Self-pay

## 2021-11-19 DIAGNOSIS — F1011 Alcohol abuse, in remission: Secondary | ICD-10-CM

## 2021-11-19 NOTE — Telephone Encounter (Signed)
5/16 @ 10:27 am Left voicemail for patient to call our office. ?

## 2021-11-20 ENCOUNTER — Telehealth: Payer: Self-pay | Admitting: Radiation Oncology

## 2021-11-20 NOTE — Telephone Encounter (Signed)
5/17 @ 9:01 am Left voicemail for patient to call our office.   ?

## 2021-11-21 ENCOUNTER — Telehealth: Payer: Self-pay | Admitting: Hematology and Oncology

## 2021-11-21 ENCOUNTER — Telehealth: Payer: Self-pay | Admitting: Radiation Oncology

## 2021-11-21 NOTE — Telephone Encounter (Signed)
5/18 @ 8:43 am Left voicemail  for patient to call our office to be schedule for consult with Dr. Lisbeth Renshaw.  Also tried pt's daughter-in-law number is out of service.

## 2021-11-21 NOTE — Telephone Encounter (Signed)
Scheduled appt per 5/16 referral. Pt's son is aware of appt date and time. Pt's son is aware to arrive 15 mins prior to appt time and to bring and updated insurance card. Pt's son is aware of appt location.

## 2021-11-27 ENCOUNTER — Ambulatory Visit
Admission: RE | Admit: 2021-11-27 | Discharge: 2021-11-27 | Disposition: A | Discharge status: Home / Self Care | Payer: Medicaid Other | Source: Ambulatory Visit | Attending: Family Medicine | Admitting: Family Medicine

## 2021-11-27 DIAGNOSIS — N6452 Nipple discharge: Secondary | ICD-10-CM

## 2021-11-27 IMAGING — MR MR BREAST BILAT WO/W CM
8 of 15 series · 31 of 48 positions shown · IV contrast (9 ml gadavist)
Comparison: [DATE] and earlier

CLINICAL DATA: New diagnosis of LEFT breast cancer. On [DATE],
patient had ultrasound-guided core biopsy of the LEFT breast 3
o'clock location showing grade 2 invasive mammary carcinoma. Biopsy
of LEFT axillary mass showed grade 2 invasive mammary carcinoma.
Patient also has clear spontaneous RIGHT nipple discharge.

EXAM:
BILATERAL BREAST MRI WITH AND WITHOUT CONTRAST
TECHNIQUE: Multiplanar, multisequence MR images of both breasts were obtained
prior to and following the intravenous administration of 9 ml of
Gadavist

[Series 2: t2_tirm_tra ipat (a-p) · axial · 3.0mm · 0.72mm/px · 1 of 55 slices shown]
[im 1/55]
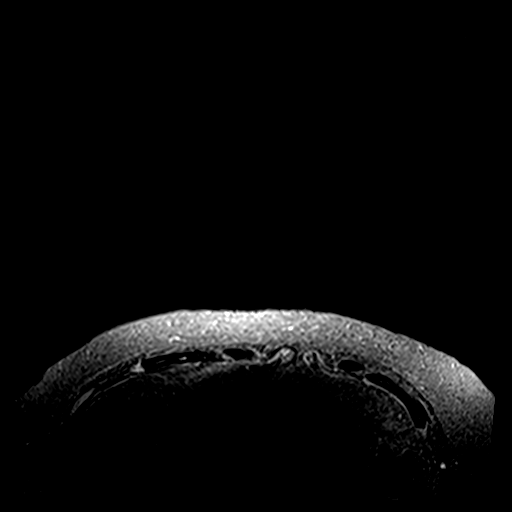

[Series 3: fl3d pre-cm no · axial · non-contrast · 1.2mm · 0.96mm/px · z∈[-84,+87]mm · 5 of 144 slices shown]
[im 1/144]
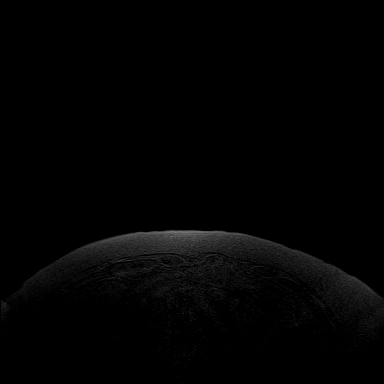
[im 36/144]
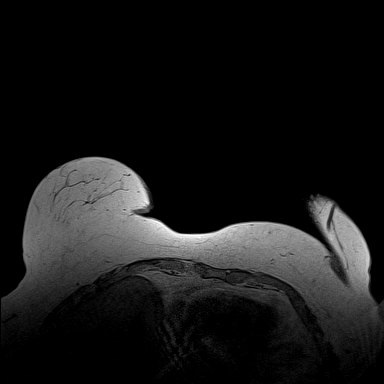
[im 72/144]
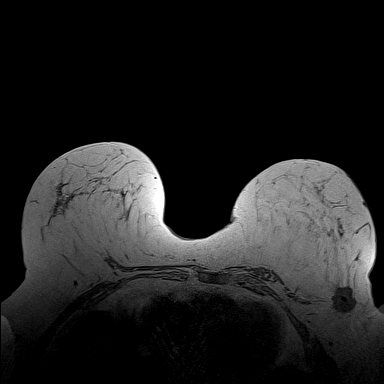
[im 108/144]
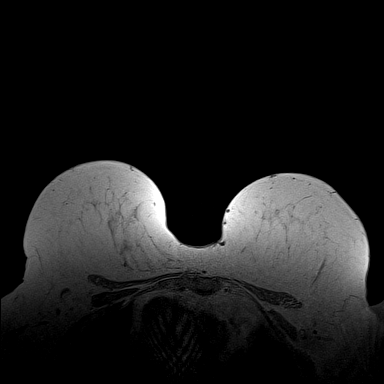
[im 144/144]
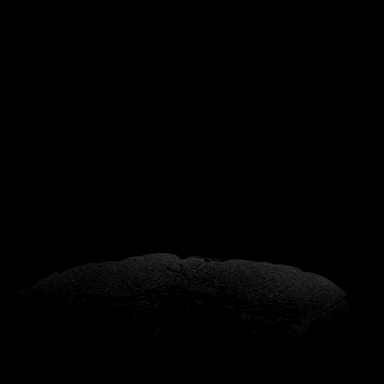

[Series 4: fl3d pre-cm · axial · non-contrast · 1.2mm · 0.96mm/px · z∈[-84,+87]mm · 5 of 144 slices shown]
[im 1/144]
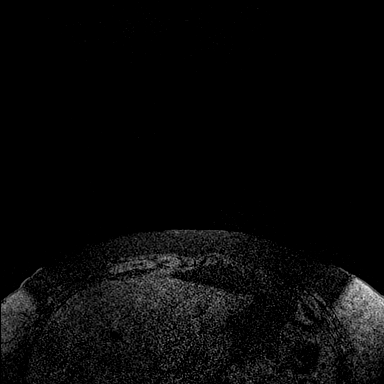
[im 36/144]
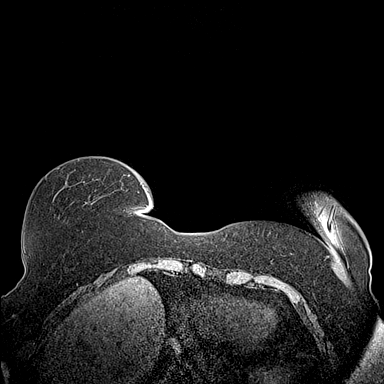
[im 72/144]
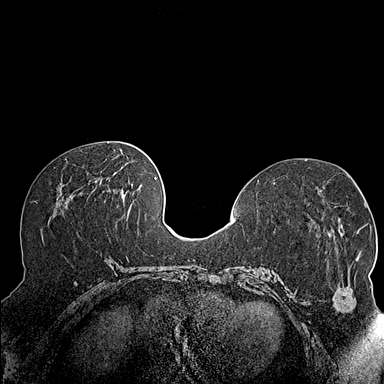
[im 108/144]
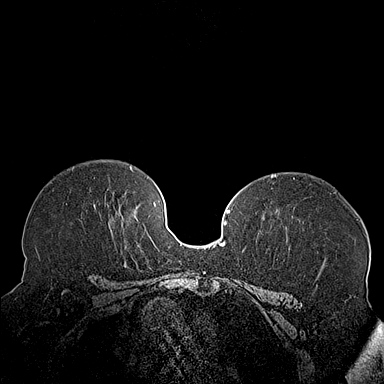
[im 144/144]
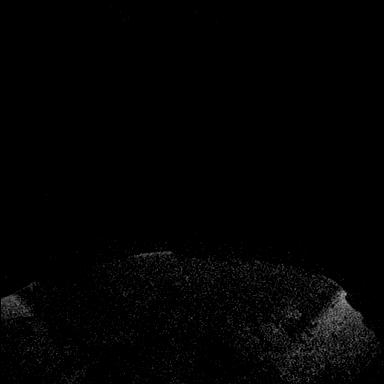

[Series 5: fl3d post-cm 20 · axial · 1.2mm · 0.96mm/px · z∈[-84,+87]mm · 5 of 144 slices shown (1 of 3)]
[im 1/144]
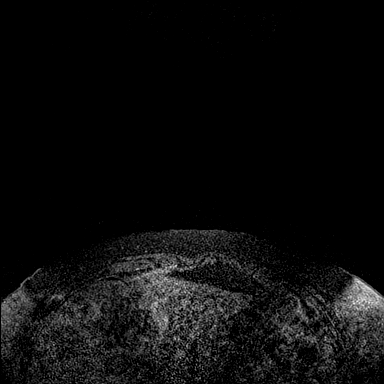
[im 36/144]
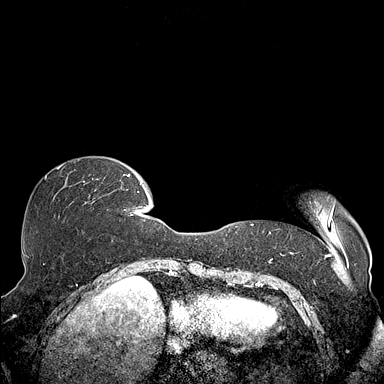
[im 72/144]
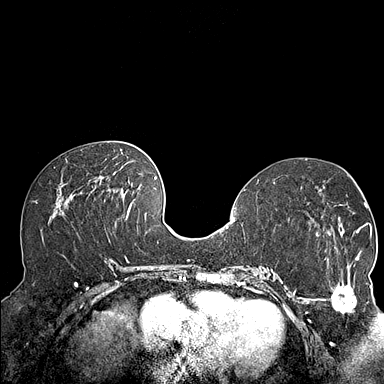
[im 108/144]
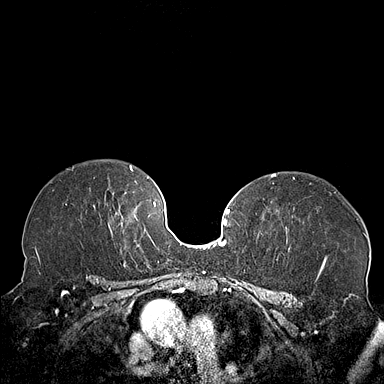
[im 144/144]
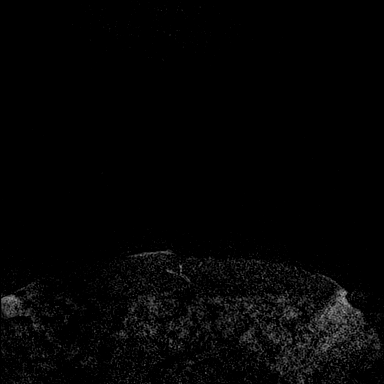

[Series 6: fl3d post-cm 20 · axial · 1.2mm · 0.96mm/px · z∈[-84,+87]mm · 5 of 144 slices shown (2 of 3)]
[im 1/144]
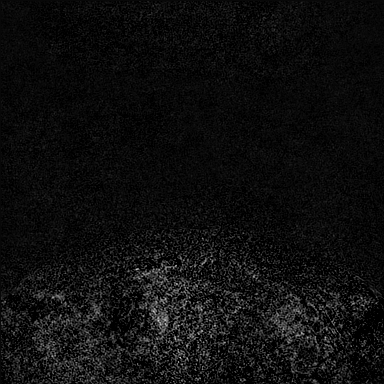
[im 36/144]
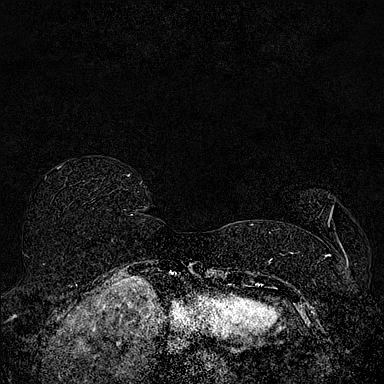
[im 72/144]
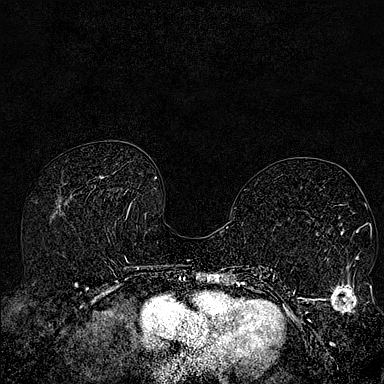
[im 108/144]
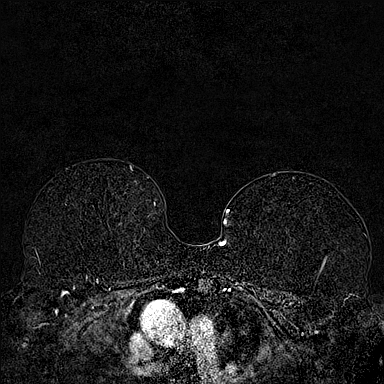
[im 144/144]
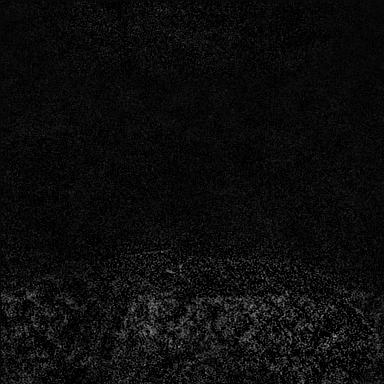

[Series 7: fl3d post-cm 20 · axial · 172.8mm · 0.96mm/px · 1 of 1 slices shown (3 of 3)]
[im 1/1]
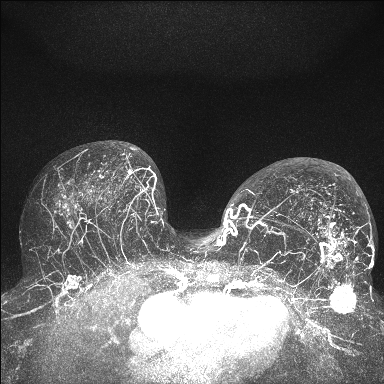

[Series 8: fl3d post-cm 3 · axial · 1.2mm · 0.96mm/px · z∈[-84,+87]mm · 5 of 144 slices shown (1 of 2)]
[im 1/144]
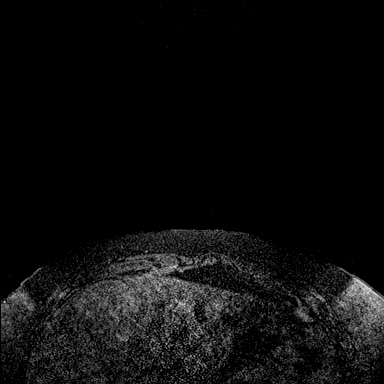
[im 36/144]
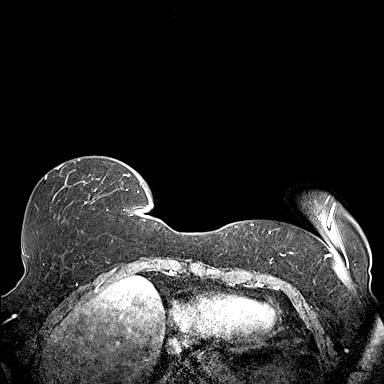
[im 72/144]
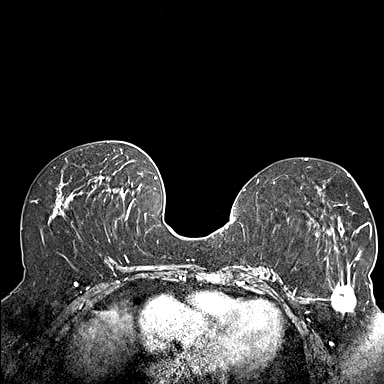
[im 108/144]
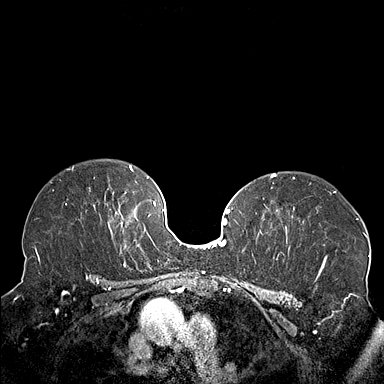
[im 144/144]
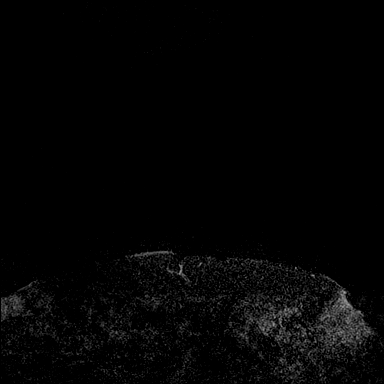

[Series 9: fl3d post-cm 3 · axial · 1.2mm · 0.96mm/px · z∈[-84,+44]mm · 4 of 144 slices shown (2 of 2)]
[im 1/144]
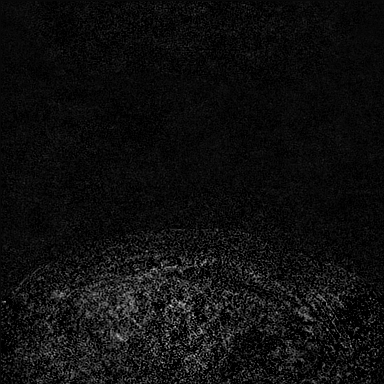
[im 36/144]
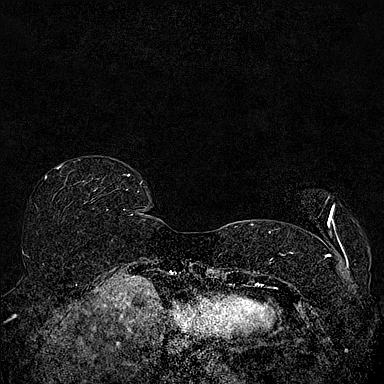
[im 72/144]
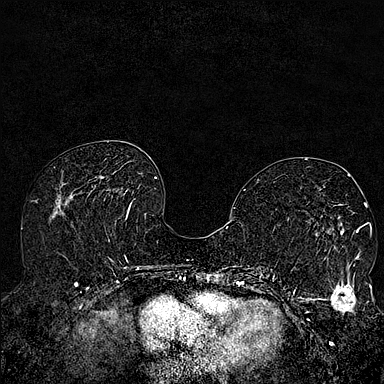
[im 108/144]
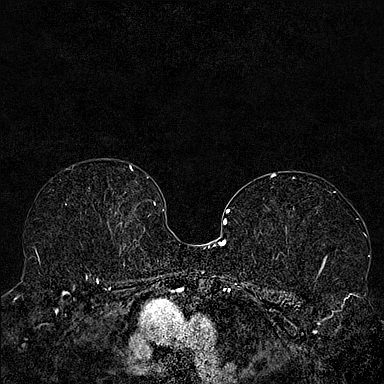

[31 of 48 positions shown; findings below may reference images not displayed]

Three-dimensional MR images were rendered by post-processing of the
original MR data on an independent workstation. The
three-dimensional MR images were interpreted, and findings are
reported in the following complete MRI report for this study. Three
dimensional images were evaluated at the independent interpreting
workstation using the DynaCAD thin client.
FINDINGS: Breast composition: b. Scattered fibroglandular tissue.

Background parenchymal enhancement: Mild

Right breast: Within the retroareolar region of the RIGHT breast,
just superior and MEDIAL to the nipple, there is a 0.5 centimeter
enhancing mass which demonstrates plateau kinetics. (Image 81 of
series 6). Mass is approximately 2.1 centimeters posterior to the
nipple. Otherwise RIGHT breast is negative.

Left breast: In the posterior LATERAL portion of the LEFT breast,
there is a spiculated rim enhancing mass measuring 2.7 x
centimeters (image 76 of series 6). Mass demonstrates washout type
enhancement kinetics a correlates well with the known malignancy.

Approximately 5.3 centimeters anterior to this mass, in the
UPPER-OUTER QUADRANT of the LEFT breast, at middle depth, there is a
linear area of enhancement measuring 0.5 x 0.7 x 1.0 centimeters
(image 52 and 53 of series 6). This enhancement shows persistent
kinetics.

Lymph nodes: Enlarged LOWER LEFT axillary lymph node is adjacent to
tissue marker clip artifact and measures 2.5 x 1.3 centimeters
(image 30 of series 3). A second small adjacent LEFT axillary lymph
node is 0.5 centimeters on image 21 of series 3, consistent with
ultrasound findings. Otherwise LEFT axilla is negative. The RIGHT
axilla is unremarkable. No internal mammary lymphadenopathy.

Ancillary findings: Within the central sternum, there is an
enhancing oval mass measuring 1.5 x 1.2 centimeters. (Image 71 of
series 6).
IMPRESSION: 1. Known LEFT breast malignancy measures 2.7 centimeters on MRI.
[DATE] centimeter area of linear enhancement in the UPPER-OUTER
QUADRANT LEFT breast at middle depth, warranting tissue diagnosis.
3. 0.5 centimeter enhancing mass 2.1 centimeters posterior to the
RIGHT nipple warrants tissue diagnosis.
4. Two enlarged LEFT axillary lymph nodes. One of these is biopsy
proven metastatic disease.
5. 1.5 centimeter enhancing sternal lesion suspicious for metastatic
disease.

RECOMMENDATION:
Recommend MR guided core biopsy of 0.5 centimeter RIGHT breast mass.

Recommend MR guided core biopsy of linear non mass enhancement in
the UPPER-OUTER QUADRANT of the LEFT breast.

Recommend treatment plan for known LEFT breast malignancy.

BI-RADS CATEGORY  4: Suspicious.

## 2021-11-27 MED ORDER — GADOBUTROL 1 MMOL/ML IV SOLN
9.0000 mL | Freq: Once | INTRAVENOUS | Status: AC | PRN
  Administered 2021-11-27: 9 mL via INTRAVENOUS

## 2021-12-03 ENCOUNTER — Other Ambulatory Visit: Payer: Self-pay | Admitting: General Surgery

## 2021-12-03 DIAGNOSIS — R928 Other abnormal and inconclusive findings on diagnostic imaging of breast: Secondary | ICD-10-CM

## 2021-12-05 ENCOUNTER — Telehealth: Payer: Self-pay | Admitting: Hematology and Oncology

## 2021-12-05 ENCOUNTER — Telehealth: Payer: Self-pay | Admitting: *Deleted

## 2021-12-05 ENCOUNTER — Other Ambulatory Visit: Payer: Medicaid Other

## 2021-12-05 ENCOUNTER — Inpatient Hospital Stay: Payer: Medicare Other | Attending: Hematology and Oncology | Admitting: Hematology and Oncology

## 2021-12-05 DIAGNOSIS — Z170 Estrogen receptor positive status [ER+]: Secondary | ICD-10-CM | POA: Insufficient documentation

## 2021-12-05 NOTE — Assessment & Plan Note (Deleted)
11/05/2021:Palpable lump in the left breast with the pain along with right-sided nipple discharge.  Mammogram reveals highly suspicious mass left breast 3 o'clock position 2.2 cm, 2 abnormal left axillary lymph nodes: Biopsy: Grade 2 IDC ER 100%, PR 0%, Ki-67 10%, HER2 2+ by IHC, FISH negative ratio 1.4, axillary lymph node biopsy: IDC, ER 100%, PR 20%, Ki-67 10%, HER2 negative ratio 1.31  Breast MRI 11/28/2021:Left breast cancer 2.7 cm and 0.7 cm linear enhancement UOQ left breast needs biopsy, 0.5 cm mass posterior to the right nipple needs biopsy, 2-lymph nodes left axilla, 1.5 cm sternal lesion  Pathology and radiology counseling: Discussed with the patient, the details of pathology including the type of breast cancer,the clinical staging, the significance of ER, PR and HER-2/neu receptors and the implications for treatment. After reviewing the pathology in detail, we proceeded to discuss the different treatment options between surgery, radiation, chemotherapy, antiestrogen therapies.  Treatment plan: 1.  Staging studies with bone scan and CT chest abdomen pelvis 2. Oncotype DX testing to determine if she would benefit from chemo 3.  Breast conserving surgery with targeted node dissection 4.  Adjuvant radiation 5.  Adjuvant antiestrogen therapy  Return to clinic based upon MammaPrint test result as well as scans.

## 2021-12-05 NOTE — Telephone Encounter (Signed)
Pt missed new pt appt today. Called pt, no answer. Left msg for pt to call back to r/s appt.

## 2021-12-05 NOTE — Telephone Encounter (Signed)
RN attempt x2 to contact pt regarding missed new pt appt today.  No answer.  LVM for pt to return call to the office to reschedule.

## 2021-12-06 ENCOUNTER — Telehealth: Payer: Self-pay | Admitting: Hematology and Oncology

## 2021-12-06 NOTE — Telephone Encounter (Signed)
Called pt to r/s missed new pt appt with Dr. Lindi Adie. Spoke to pt who said she is going to call back to sch this appt, she declined to r/s at this time. I gave her my direct number to call back to once she was ready to sch.

## 2021-12-09 ENCOUNTER — Ambulatory Visit
Admission: RE | Admit: 2021-12-09 | Discharge: 2021-12-09 | Disposition: A | Discharge status: Home / Self Care | Payer: Medicare Other | Source: Ambulatory Visit | Attending: General Surgery | Admitting: General Surgery

## 2021-12-09 ENCOUNTER — Ambulatory Visit
Admission: RE | Admit: 2021-12-09 | Discharge: 2021-12-09 | Disposition: A | Discharge status: Home / Self Care | Payer: Medicaid Other | Source: Ambulatory Visit | Attending: General Surgery | Admitting: General Surgery

## 2021-12-09 DIAGNOSIS — R928 Other abnormal and inconclusive findings on diagnostic imaging of breast: Secondary | ICD-10-CM

## 2021-12-09 IMAGING — MG MM BREAST LOCALIZATION CLIP
4 series · 4 of 12 positions shown · non-contrast
Comparison: Previous exam(s).

CLINICAL DATA: Post procedure mammogram for clip placement

EXAM:
3D DIAGNOSTIC BILATERAL MAMMOGRAM POST MRI BIOPSY

[L CC synth-2D]
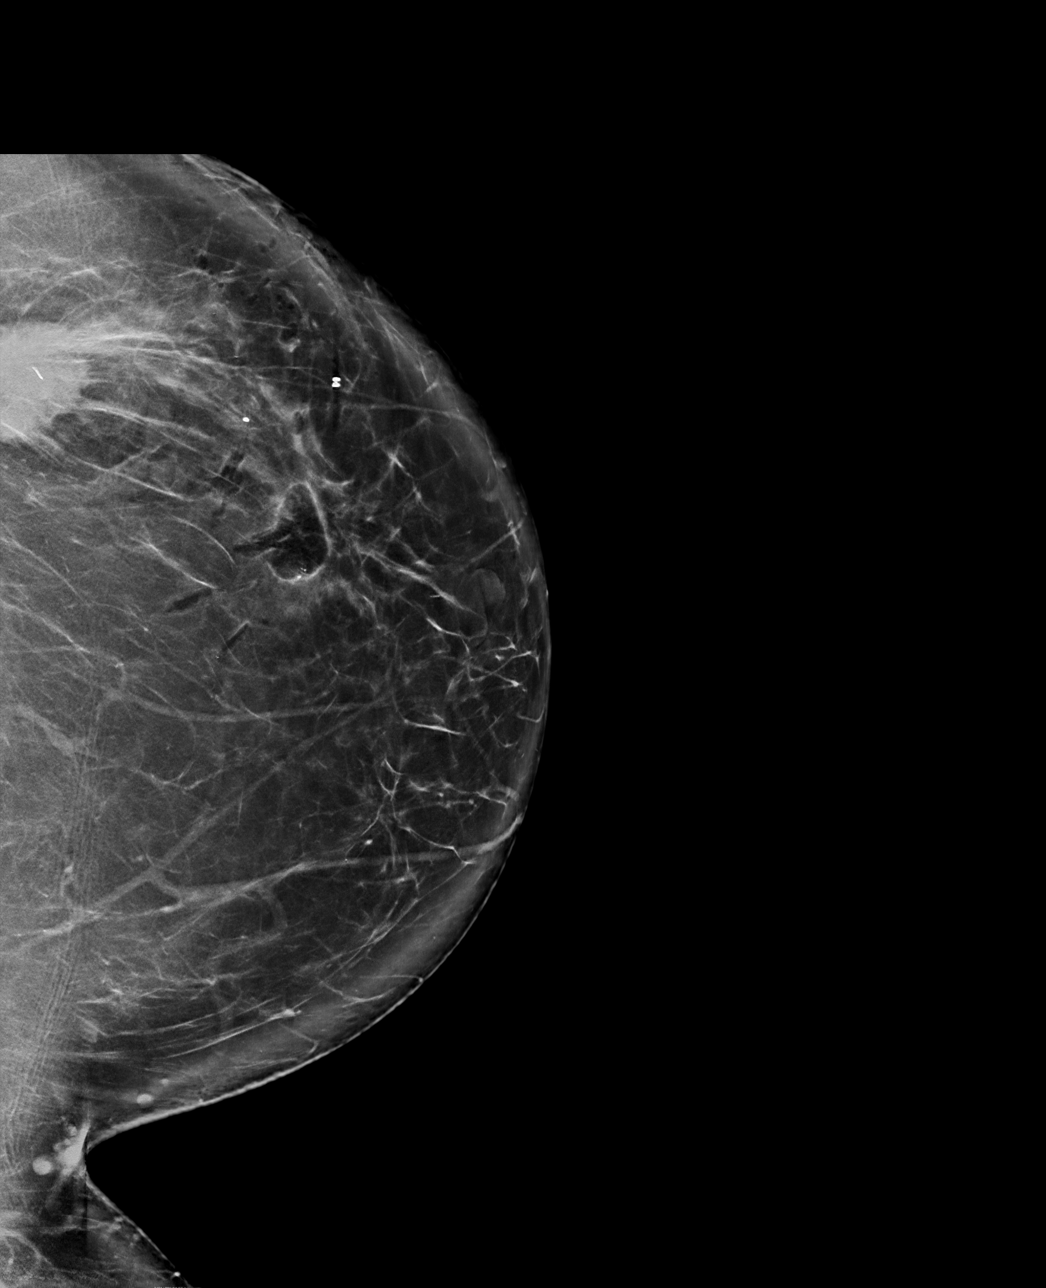

[L ML synth-2D]
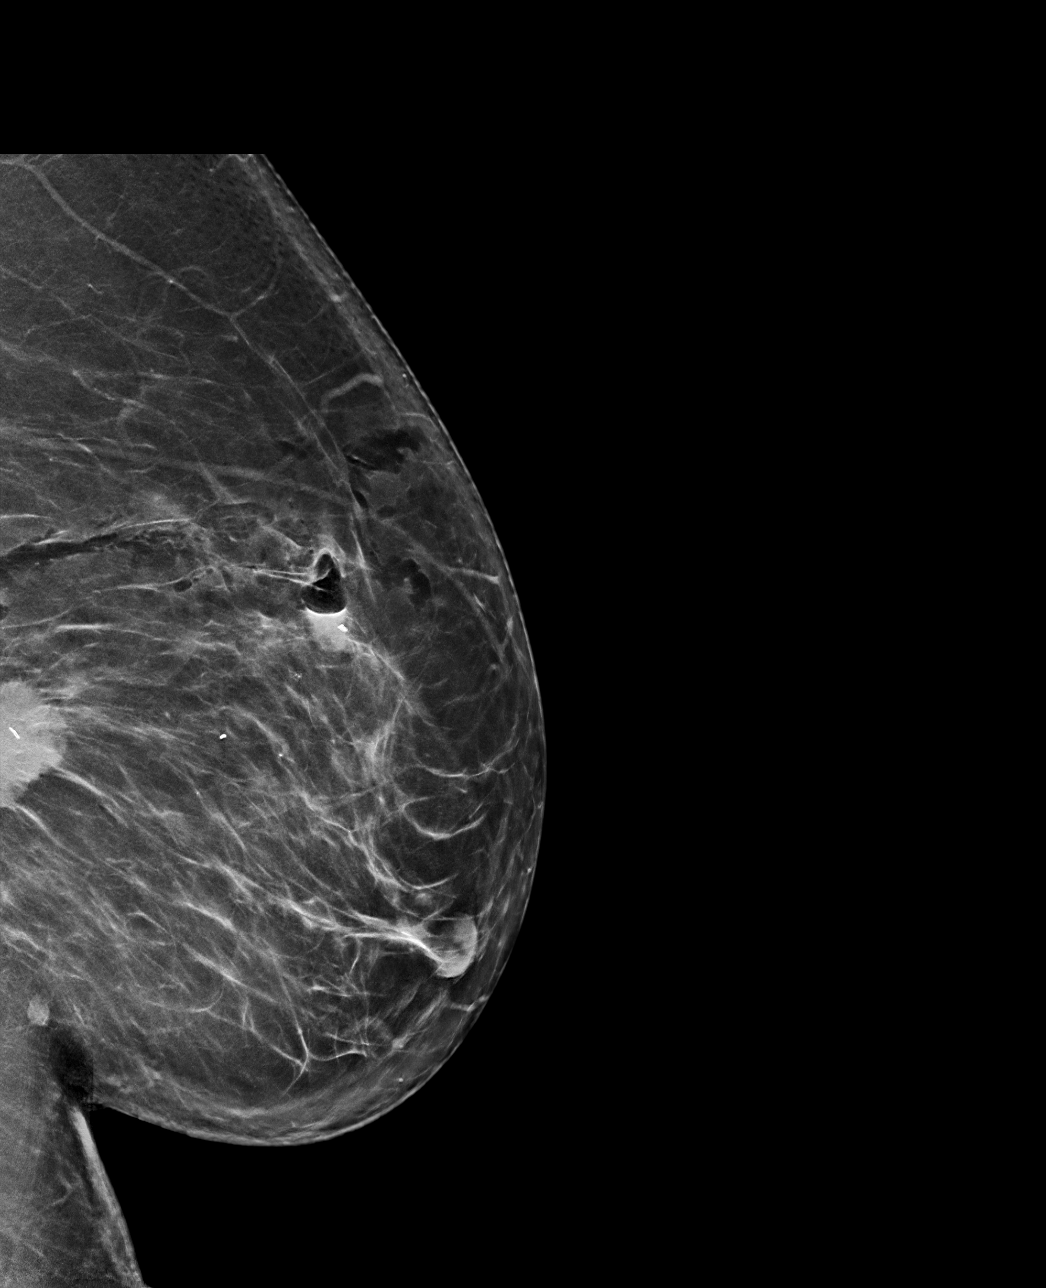

[L CC tomo · tomo slice 48/95.0]
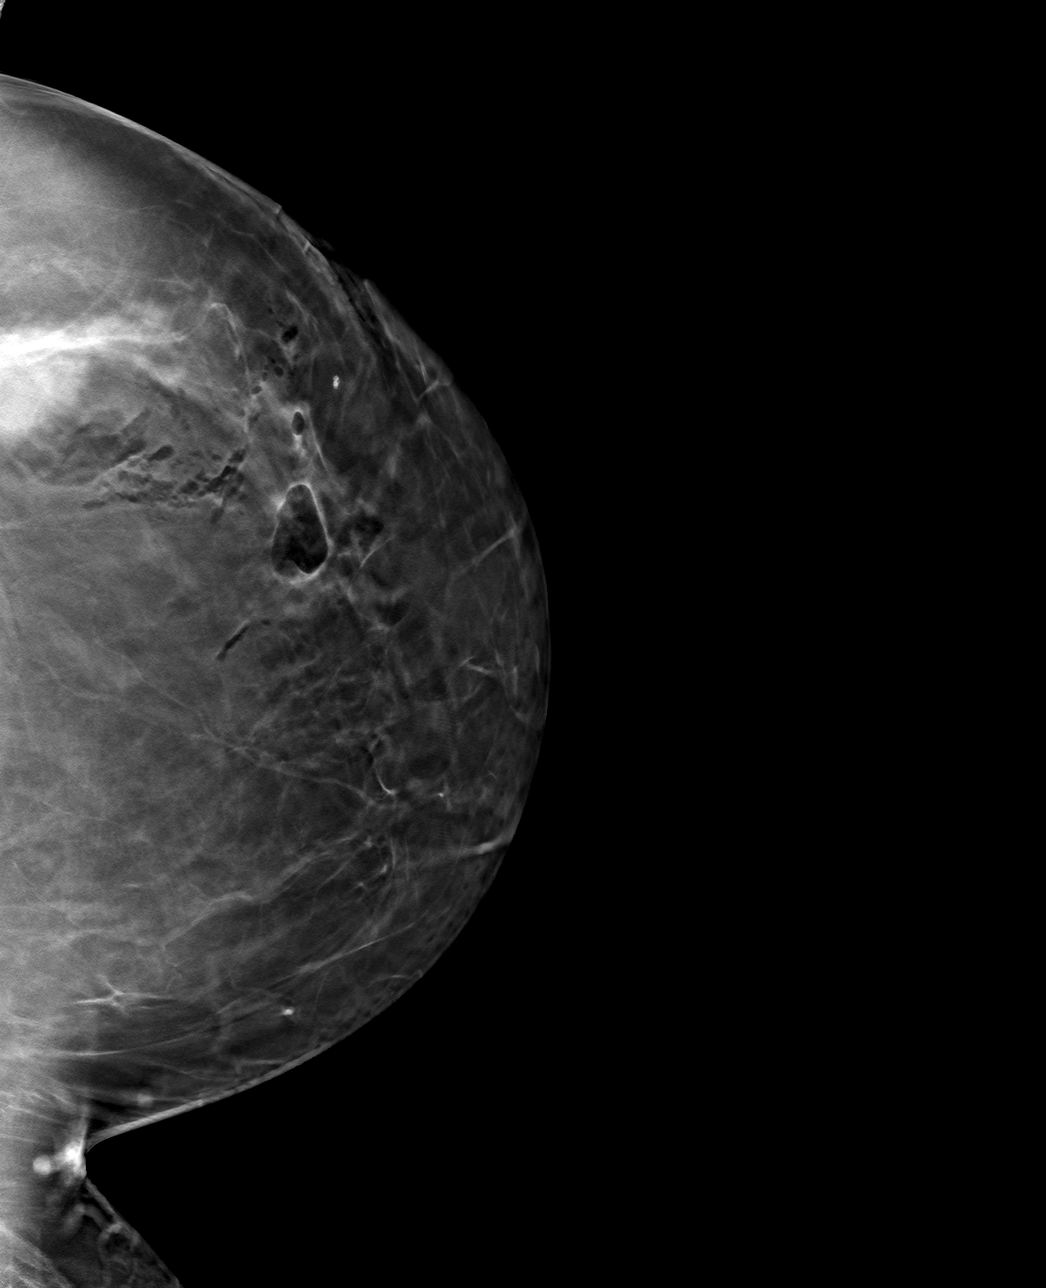

[L ML tomo · tomo slice 45/90.0]
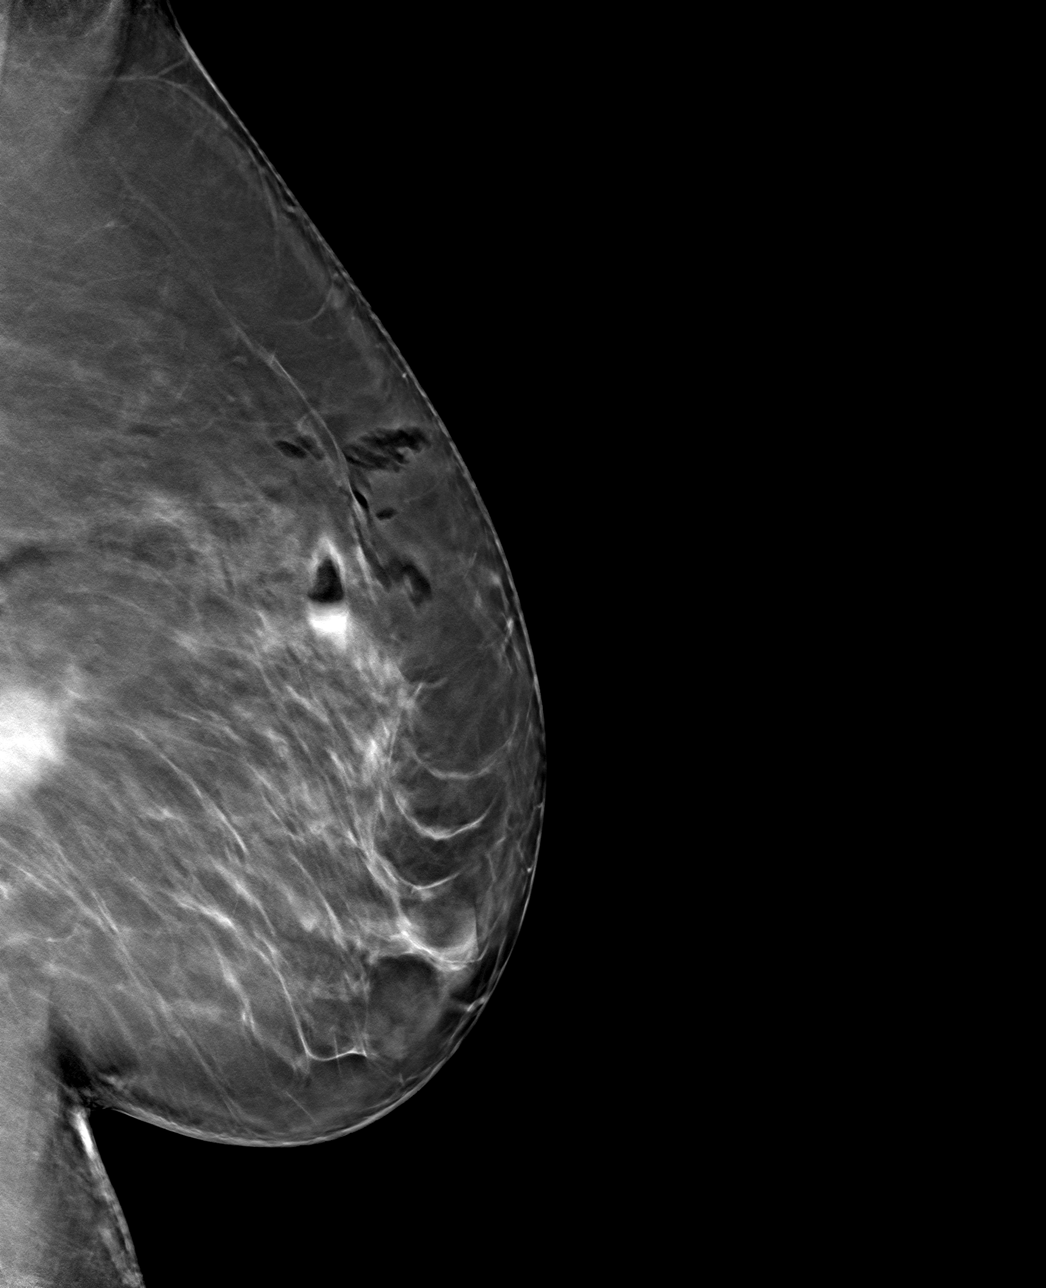

[4 of 12 positions shown; findings below may reference images not displayed]

FINDINGS: 3D Mammographic images were obtained following MRI guided biopsy of
a mass in the retroareolar right breast. The cylinder biopsy marking
clip is in expected position at the site of biopsy.

3D Mammographic images were obtained following MRI guided biopsy of
non mass enhancement in the upper outer left breast. The barbell
biopsy marking clip appears displaced laterally by approximately
cm.
IMPRESSION: Appropriate positioning of the cylinder shaped biopsy marking clip
at the site of biopsy in the retroareolar right breast.

The barbell shaped biopsy marking clip in the left breast appears
displaced laterally by approximately 2.8 cm.

Final Assessment: Post Procedure Mammograms for Marker Placement

## 2021-12-09 IMAGING — MG MM BREAST LOCALIZATION CLIP
4 series · 4 of 12 positions shown · non-contrast
Comparison: Previous exam(s).

CLINICAL DATA: Post procedure mammogram for clip placement

EXAM:
3D DIAGNOSTIC BILATERAL MAMMOGRAM POST MRI BIOPSY

[R ML synth-2D]
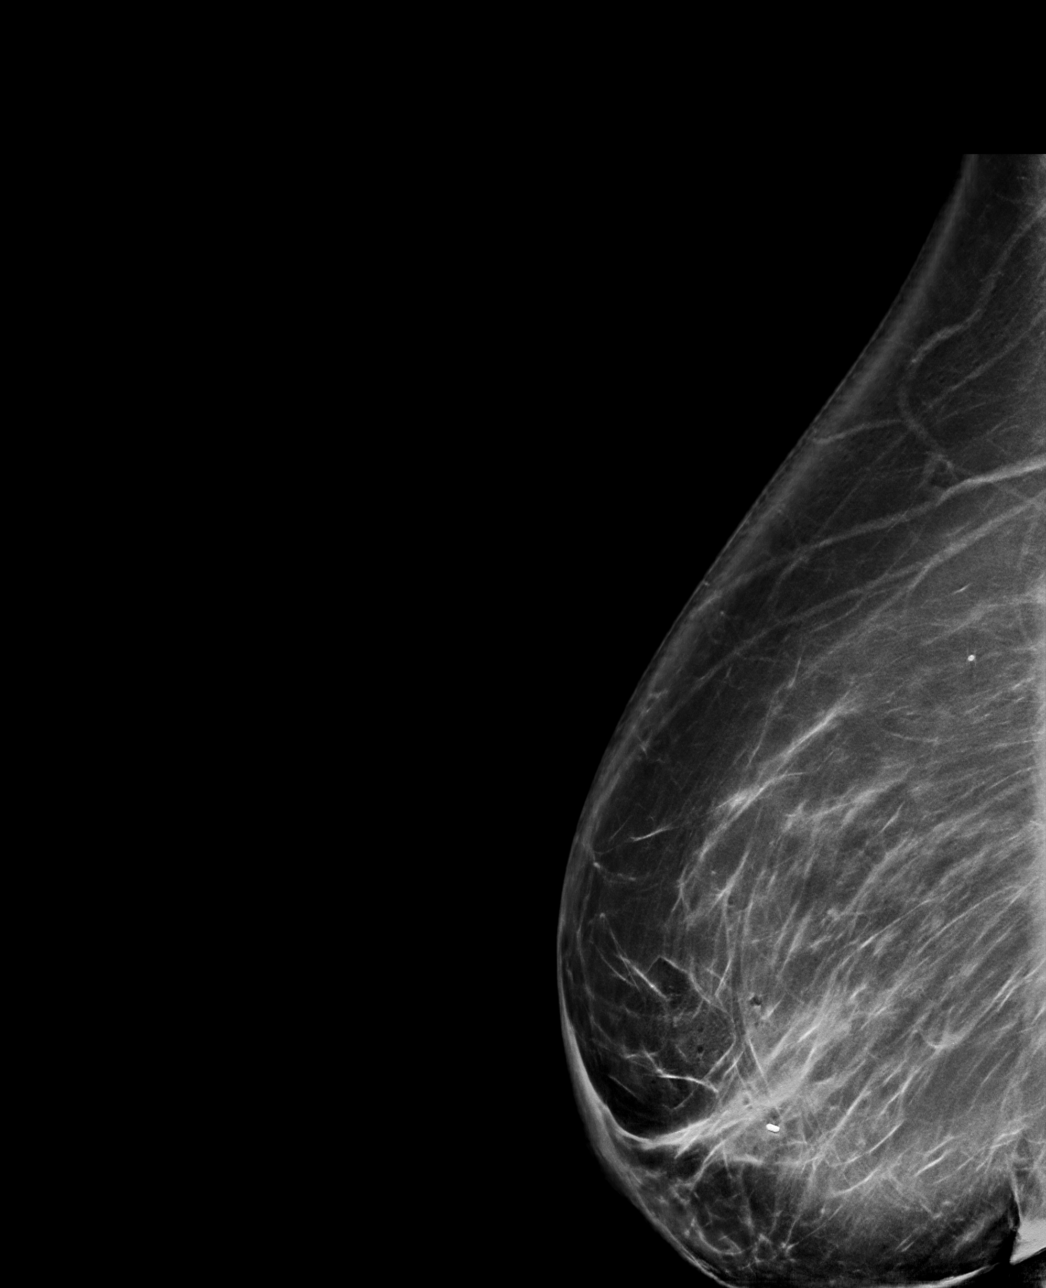

[R CC synth-2D]
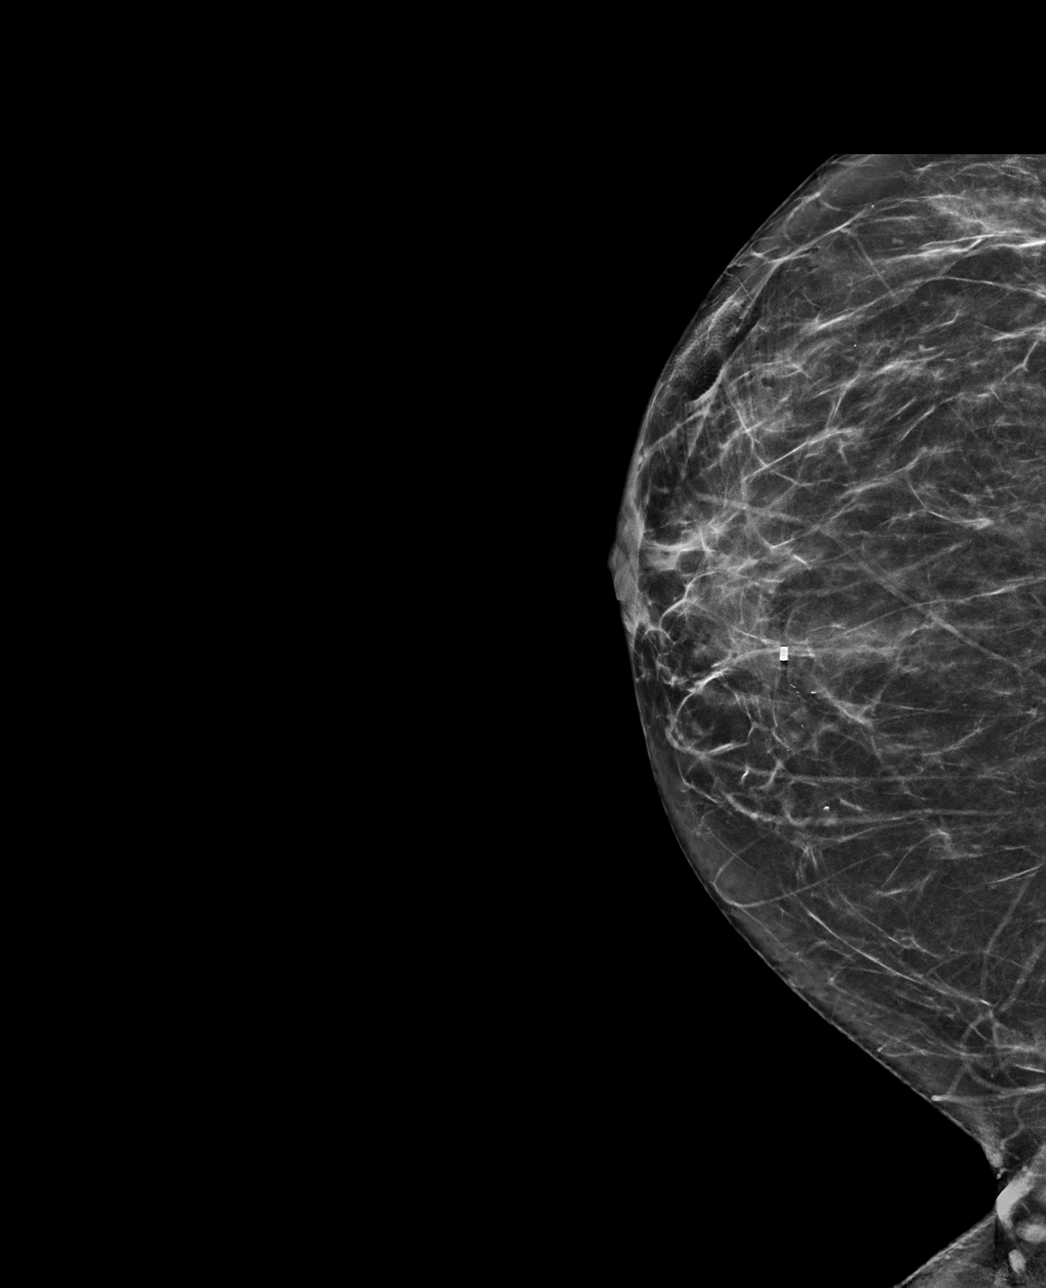

[R ML tomo · tomo slice 53/104.0]
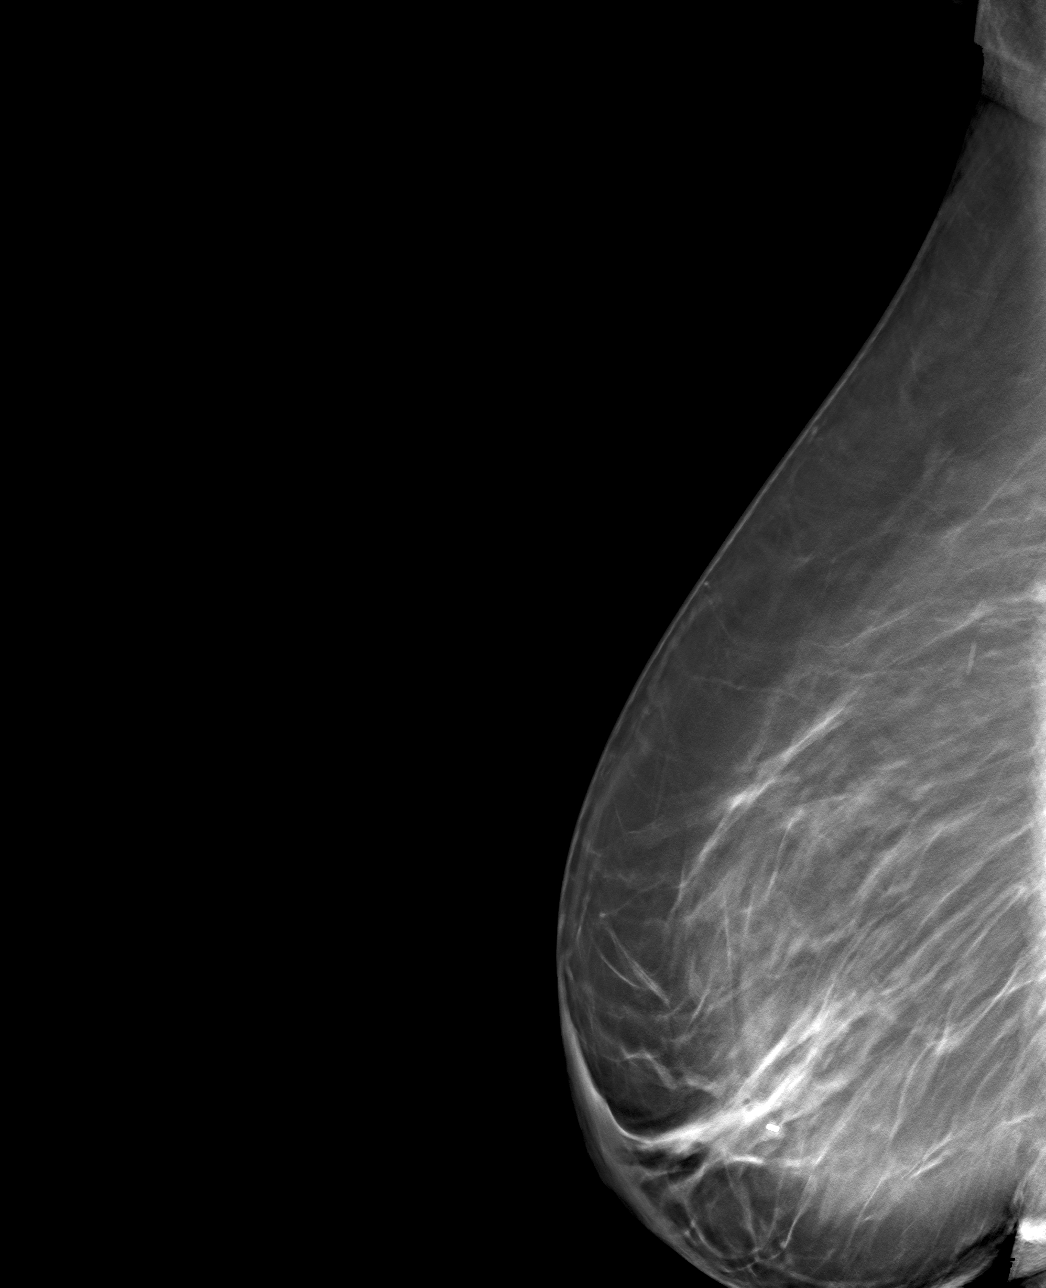

[R CC tomo · tomo slice 35/68.0]
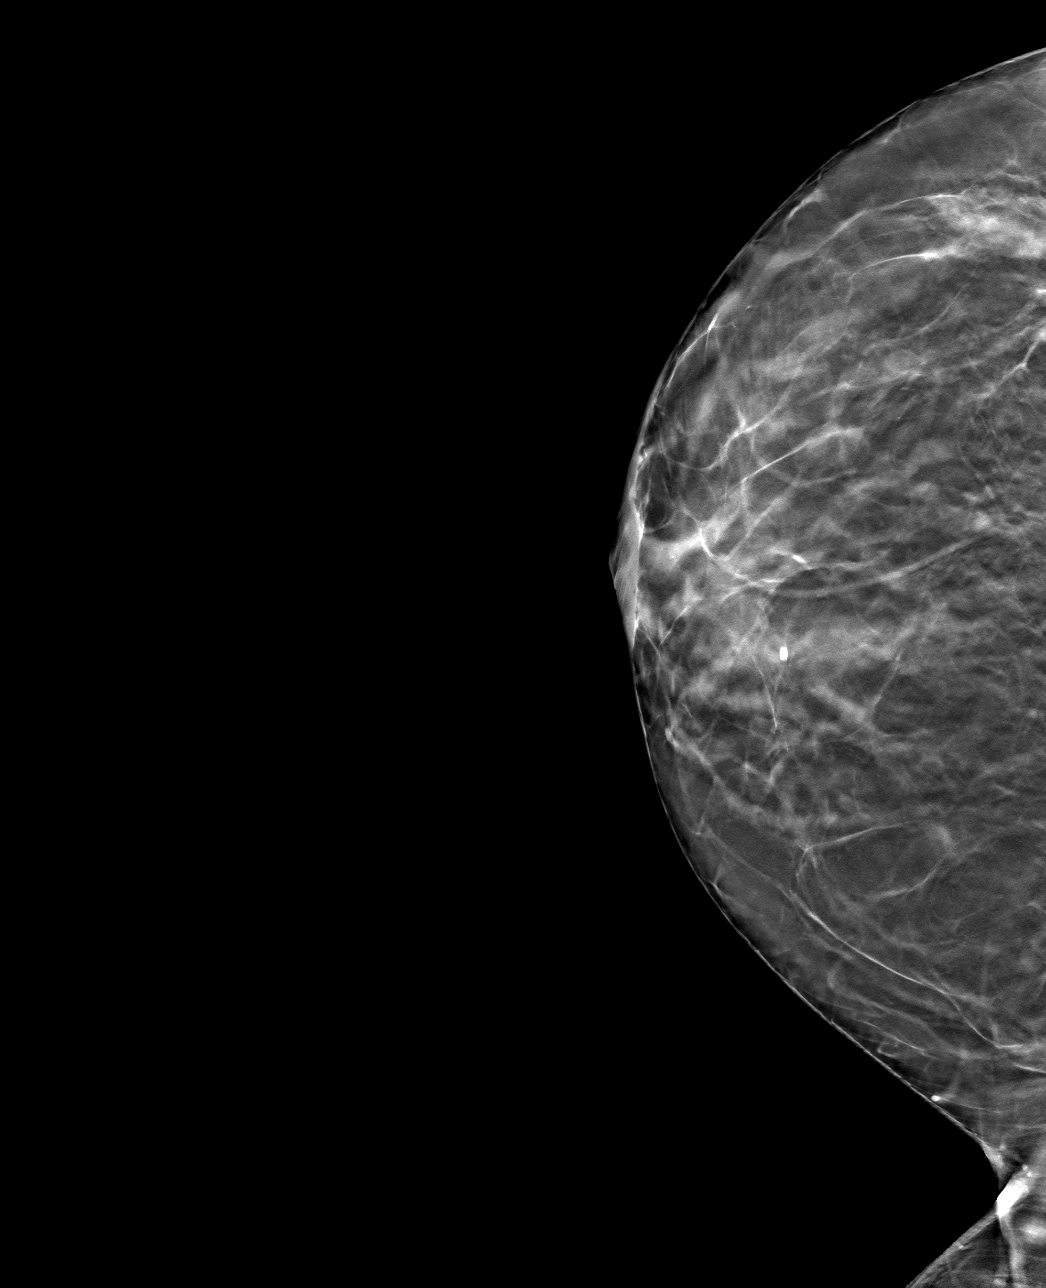

[4 of 12 positions shown; findings below may reference images not displayed]

FINDINGS: 3D Mammographic images were obtained following MRI guided biopsy of
a mass in the retroareolar right breast. The cylinder biopsy marking
clip is in expected position at the site of biopsy.

3D Mammographic images were obtained following MRI guided biopsy of
non mass enhancement in the upper outer left breast. The barbell
biopsy marking clip appears displaced laterally by approximately
cm.
IMPRESSION: Appropriate positioning of the cylinder shaped biopsy marking clip
at the site of biopsy in the retroareolar right breast.

The barbell shaped biopsy marking clip in the left breast appears
displaced laterally by approximately 2.8 cm.

Final Assessment: Post Procedure Mammograms for Marker Placement

## 2021-12-09 IMAGING — MR MR BREAST BX W LOC DEV 1ST LESION IMAGE BX SPEC MR GUIDE*L*
7 of 10 series · 31 of 48 positions shown · IV contrast (10 ml Gadavist)
Comparison: Previous exams.
COMPARISON: Previous exams.

Addendum:
CLINICAL DATA: 66-year-old female with newly diagnosed left breast
cancer presenting for bilateral breast biopsy.

EXAM:
MRI GUIDED CORE NEEDLE BIOPSY OF THE RIGHT BREAST
MRI GUIDED CORE NEEDLE BIOPSY OF THE LEFT BREAST
TECHNIQUE: Multiplanar, multisequence MR imaging of the bilateral breast was
performed both before and after administration of intravenous
contrast.
CONTRAST:  10mL GADAVIST GADOBUTROL 1 MMOL/ML IV SOLN

[Series 3: fiducial bilateral · sagittal · 2.0mm · 1.33mm/px · 3 of 160 slices shown]
[im 1/160]
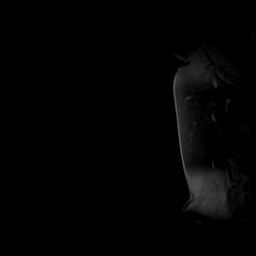
[im 80/160]
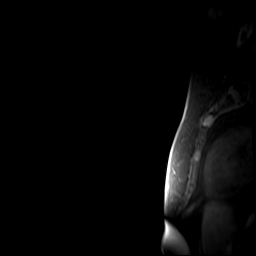
[im 160/160]
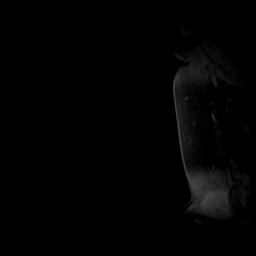

[Series 4: dynamic pre · axial · non-contrast · 1.3mm · 0.73mm/px · z∈[-103,+145]mm · 5 of 192 slices shown]
[im 1/192]
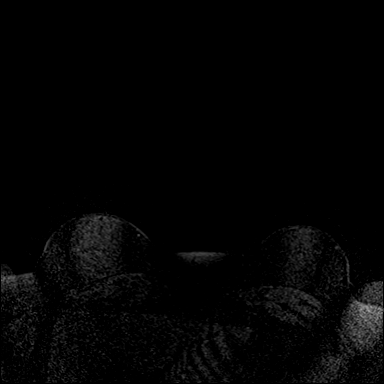
[im 48/192]
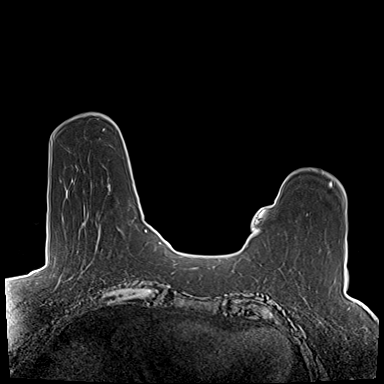
[im 96/192]
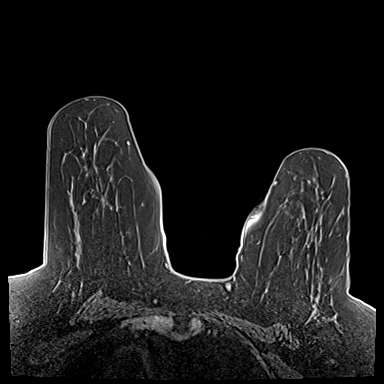
[im 144/192]
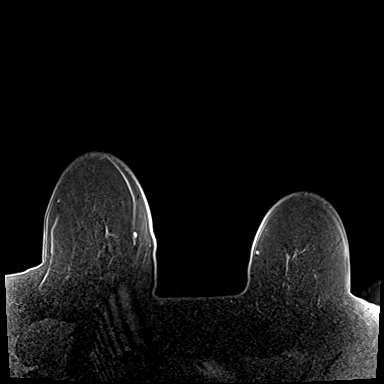
[im 192/192]
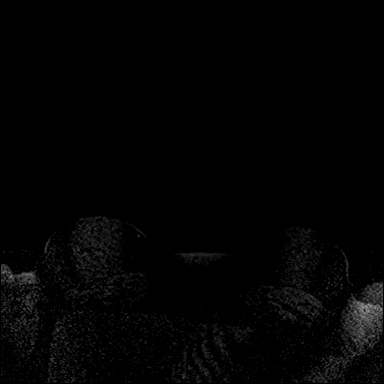

[Series 5: dynamic post 20 · axial · 1.3mm · 0.73mm/px · z∈[-103,+145]mm · 5 of 192 slices shown (1 of 2)]
[im 1/192]
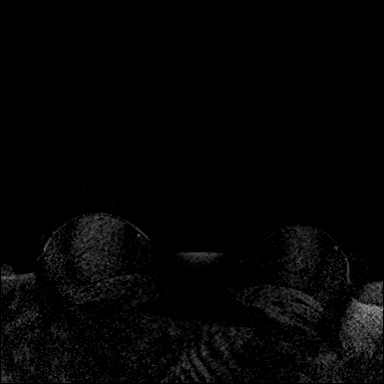
[im 48/192]
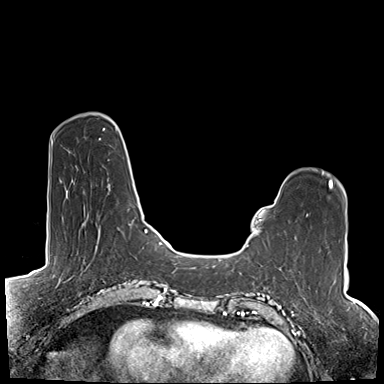
[im 96/192]
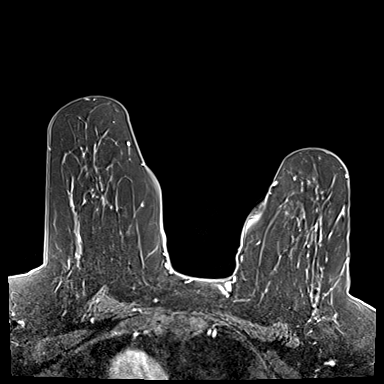
[im 144/192]
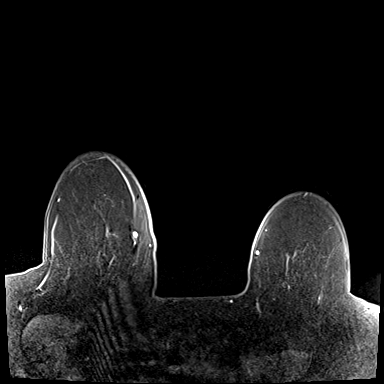
[im 192/192]
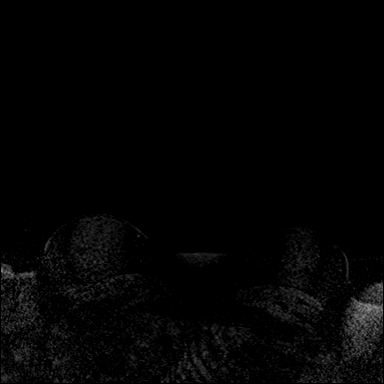

[Series 6: dynamic post 20 · axial · 1.3mm · 0.73mm/px · z∈[-103,+145]mm · 5 of 190 slices shown (2 of 2)]
[im 1/190]
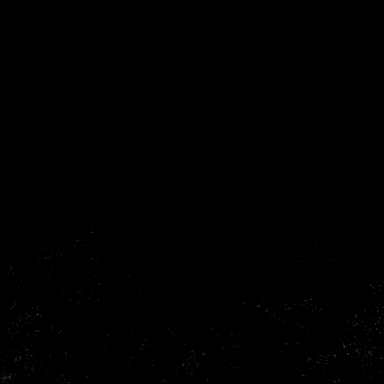
[im 48/190]
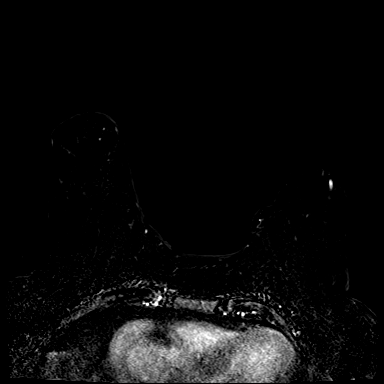
[im 95/190]
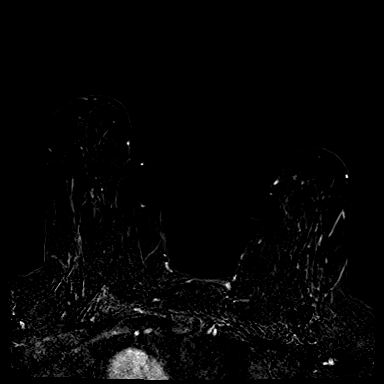
[im 142/190]
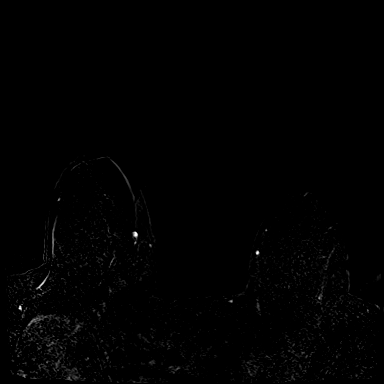
[im 190/190]
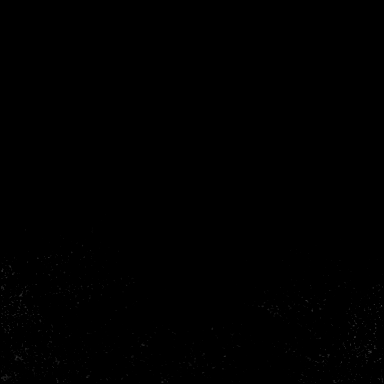

[Series 7: dynamic post 3 · axial · 1.3mm · 0.73mm/px · z∈[-103,+145]mm · 5 of 192 slices shown (1 of 2)]
[im 1/192]
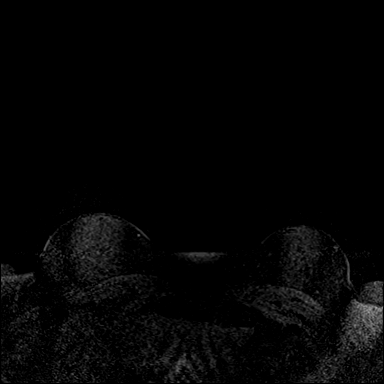
[im 48/192]
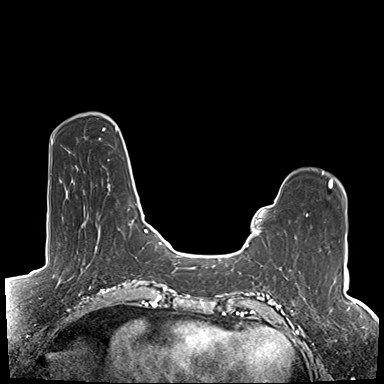
[im 96/192]
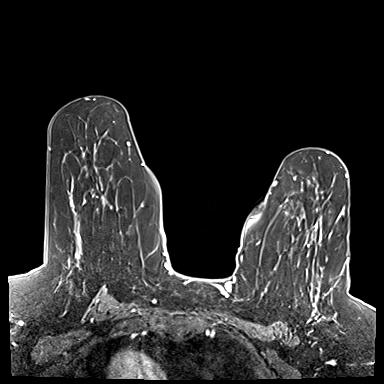
[im 144/192]
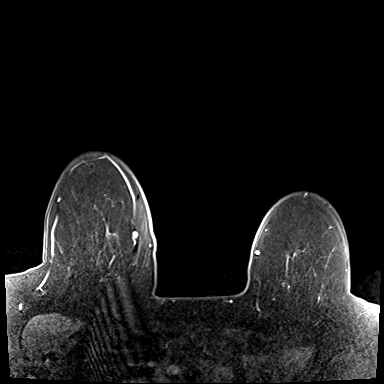
[im 192/192]
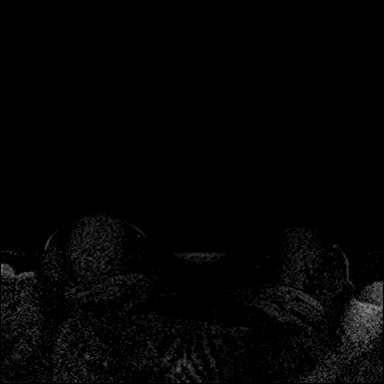

[Series 8: dynamic post 3 · axial · 1.3mm · 0.73mm/px · z∈[-103,+145]mm · 5 of 192 slices shown (2 of 2)]
[im 1/192]
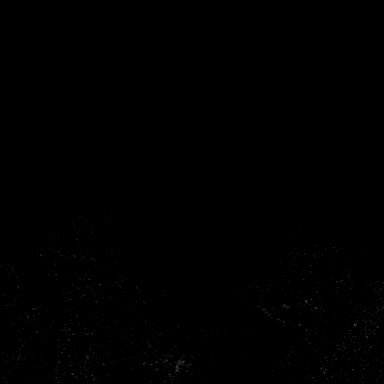
[im 48/192]
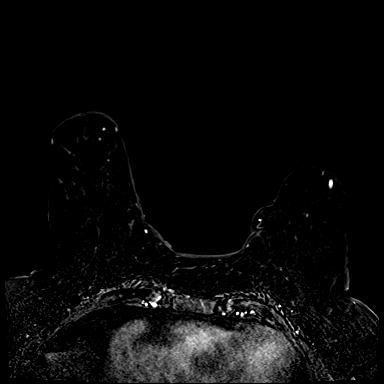
[im 96/192]
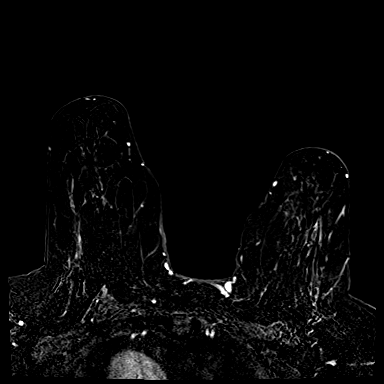
[im 144/192]
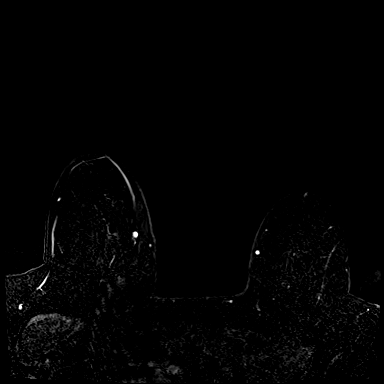
[im 192/192]
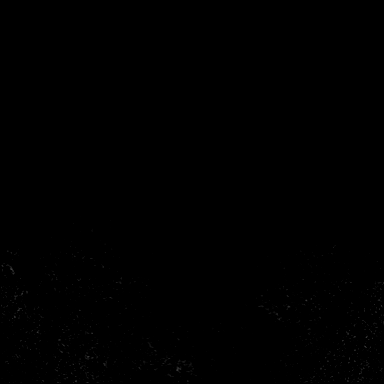

[Series 9: needle confirmation · axial · 1.3mm · 0.73mm/px · z∈[-103,+20]mm · 3 of 192 slices shown]
[im 1/192]
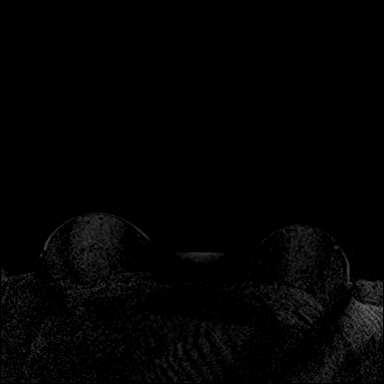
[im 48/192]
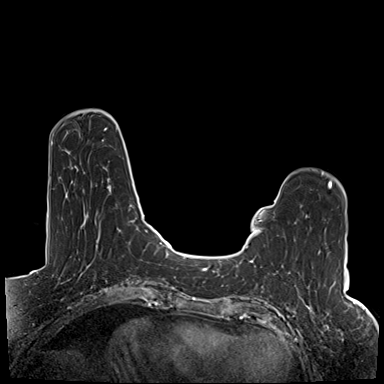
[im 96/192]
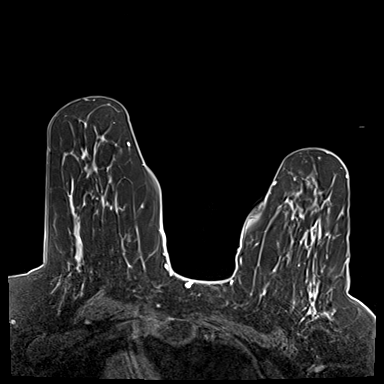

[31 of 48 positions shown; findings below may reference images not displayed]

FINDINGS: I met with the patient, and we discussed the procedure of MRI guided
biopsy, including risks, benefits, and alternatives. Specifically,
we discussed the risks of infection, bleeding, tissue injury, clip
migration, and inadequate sampling. Informed, written consent was
given. The usual time out protocol was performed immediately prior
to the procedure.

1. Using sterile technique, 1% Lidocaine, MRI guidance, and a 9
gauge vacuum assisted device, biopsy was performed of non mass
enhancement in the upper outer left breast using a lateral approach.
At the conclusion of the procedure, a barbell tissue marker clip was
deployed into the biopsy cavity. Follow-up 2-view mammogram was
performed and dictated separately.

2. Using sterile technique, 1% Lidocaine, MRI guidance, and a 9
gauge vacuum assisted device, biopsy was performed of a mass in the
retroareolar right breast using a lateral approach. At the
conclusion of the procedure, a cylinder tissue marker clip was
deployed into the biopsy cavity. Follow-up 2-view mammogram was
performed and dictated separately. There is a small post biopsy
hematoma in the retroareolar right breast. The bleeding was
completely stopped prior to the patient leaving the office.
IMPRESSION: MRI guided biopsy of non mass enhancement in the upper outer left
breast and of a mass in the retroareolar right breast.

ADDENDUM:
Pathology revealed breast, LEFT, needle core biopsy, upper outer-
BENIGN BREAST TISSUE WITH USUAL DUCTAL HYPERPLASIA, FIBROCYSTIC
CHANGE AND FOCAL COLUMNAR CELL CHANGE (barbell clip). This was found
to be concordant by Dr. GULMEMMED.

Pathology revealed Breast, RIGHT, needle core biopsy, retroareolar-
INTRADUCTAL PAPILLOMA WITH SCLEROSIS, APOCRINE METAPLASIA AND
CHANGES CONSISTENT WITH AN ASSOCIATED DUCT ECTASIA (cylinder clip).
This was found to be concordant by Dr. GULMEMMED, with
excision recommended.

Pathology results were discussed with the patient and
daughter-in-law (GULMEMMED) by telephone. The patient reported doing
well after the biopsies with tenderness at the sites. Post biopsy
instructions and care were reviewed and questions were answered. The
patient was encouraged to call [REDACTED] for any additional concerns.

The patient has a recent diagnosis of LEFT breast cancer and should
follow her outlined treatment plan.

Pathology results reported by GULMEMMED RN on [DATE].

*** End of Addendum ***
FINDINGS: I met with the patient, and we discussed the procedure of MRI guided
biopsy, including risks, benefits, and alternatives. Specifically,
we discussed the risks of infection, bleeding, tissue injury, clip
migration, and inadequate sampling. Informed, written consent was
given. The usual time out protocol was performed immediately prior
to the procedure.

1. Using sterile technique, 1% Lidocaine, MRI guidance, and a 9
gauge vacuum assisted device, biopsy was performed of non mass
enhancement in the upper outer left breast using a lateral approach.
At the conclusion of the procedure, a barbell tissue marker clip was
deployed into the biopsy cavity. Follow-up 2-view mammogram was
performed and dictated separately.

2. Using sterile technique, 1% Lidocaine, MRI guidance, and a 9
gauge vacuum assisted device, biopsy was performed of a mass in the
retroareolar right breast using a lateral approach. At the
conclusion of the procedure, a cylinder tissue marker clip was
deployed into the biopsy cavity. Follow-up 2-view mammogram was
performed and dictated separately. There is a small post biopsy
hematoma in the retroareolar right breast. The bleeding was
completely stopped prior to the patient leaving the office.
IMPRESSION: MRI guided biopsy of non mass enhancement in the upper outer left
breast and of a mass in the retroareolar right breast.

## 2021-12-09 MED ORDER — GADOBUTROL 1 MMOL/ML IV SOLN
10.0000 mL | Freq: Once | INTRAVENOUS | Status: AC | PRN
  Administered 2021-12-09: 10 mL via INTRAVENOUS

## 2021-12-16 ENCOUNTER — Other Ambulatory Visit: Payer: Self-pay | Admitting: General Surgery

## 2021-12-16 DIAGNOSIS — D241 Benign neoplasm of right breast: Secondary | ICD-10-CM

## 2021-12-23 ENCOUNTER — Encounter: Payer: Self-pay | Admitting: *Deleted

## 2021-12-23 ENCOUNTER — Other Ambulatory Visit: Payer: Self-pay | Admitting: General Surgery

## 2021-12-23 ENCOUNTER — Telehealth: Payer: Self-pay | Admitting: *Deleted

## 2021-12-23 DIAGNOSIS — Z170 Estrogen receptor positive status [ER+]: Secondary | ICD-10-CM

## 2021-12-23 NOTE — Telephone Encounter (Signed)
Msg sent to Dr Barry Dienes informing pt declined med/rad onc consults at this time.

## 2022-01-02 ENCOUNTER — Ambulatory Visit
Admission: RE | Admit: 2022-01-02 | Discharge: 2022-01-02 | Disposition: A | Discharge status: Home / Self Care | Payer: Medicaid Other | Source: Ambulatory Visit | Attending: Family Medicine | Admitting: Family Medicine

## 2022-01-02 ENCOUNTER — Other Ambulatory Visit: Payer: Self-pay | Admitting: Family Medicine

## 2022-01-02 DIAGNOSIS — R519 Headache, unspecified: Secondary | ICD-10-CM

## 2022-01-02 DIAGNOSIS — R0602 Shortness of breath: Secondary | ICD-10-CM

## 2022-01-06 NOTE — Pre-Procedure Instructions (Signed)
Surgical Instructions    Your procedure is scheduled on Wednesday, July 12th.  Report to [facility] Main Entrance "A" at 11:30 A.M., then check in with the Admitting office.  Call this number if you have problems the morning of surgery:  [phone]   If you have any questions prior to your surgery date call [phone]: Open Monday-Friday 8am-4pm    Remember:  Do not eat after midnight the night before your surgery  You may drink clear liquids until 10:30 AM the morning of your surgery.   Clear liquids allowed are: Water, Non-Citrus Juices (without pulp), Carbonated Beverages, Clear Tea, Black Coffee Only (NO MILK, CREAM OR POWDERED CREAMER of any kind), and Gatorade.  Patient Instructions  The night before surgery:  No food after midnight. ONLY clear liquids after midnight  The day of surgery (if you do NOT have diabetes):  Drink ONE (1) Pre-Surgery Clear Ensure by 10:30 AM the morning of surgery. Drink in one sitting. Do not sip.  This drink was given to you during your hospital  pre-op appointment visit.  Nothing else to drink after completing the  Pre-Surgery Clear Ensure.          If you have questions, please contact your surgeon's office.      Take these medicines the morning of surgery with A SIP OF WATER  risperiDONE (RISPERDAL)  topiramate (TOPAMAX)    As of today, STOP taking any Aspirin (unless otherwise instructed by your surgeon) Aleve, Naproxen, Ibuprofen, Motrin, Advil, Goody's, BC's, all herbal medications, fish oil, and all vitamins.                     Do NOT Smoke (Tobacco/Vaping) for 24 hours prior to your procedure.  If you use a CPAP at night, you may bring your mask/headgear for your overnight stay.   Contacts, glasses, piercing's, hearing aid's, dentures or partials may not be worn into surgery, please bring cases for these belongings.    For patients admitted to the hospital, discharge time will be determined by your treatment team.    Patients discharged the day of surgery will not be allowed to drive home, and someone needs to stay with them for 24 hours.  SURGICAL WAITING ROOM VISITATION Patients having surgery or a procedure may have two support people in the waiting room. These visitors may be switched out with other visitors if needed. Children under the age of 57 must have an adult accompany them who is not the patient. If the patient needs to stay at the hospital during part of their recovery, the visitor guidelines for inpatient rooms apply.  Please refer to the [facility] website for the visitor guidelines for Inpatients (after your surgery is over and you are in a regular room).    Special instructions:   - Preparing For Surgery  Before surgery, you can play an important role. Because skin is not sterile, your skin needs to be as free of germs as possible. You can reduce the number of germs on your skin by washing with CHG (chlorahexidine gluconate) Soap before surgery.  CHG is an antiseptic cleaner which kills germs and bonds with the skin to continue killing germs even after washing.    Oral Hygiene is also important to reduce your risk of infection.  Remember - BRUSH YOUR TEETH THE MORNING OF SURGERY WITH YOUR REGULAR TOOTHPASTE  Please do not use if you have an allergy to CHG or antibacterial soaps. If your skin becomes  reddened/irritated stop using the CHG.  Do not shave (including legs and underarms) for at least 48 hours prior to first CHG shower. It is OK to shave your face.  Please follow these instructions carefully.   Shower the NIGHT BEFORE SURGERY and the MORNING OF SURGERY  If you chose to wash your hair, wash your hair first as usual with your normal shampoo.  After you shampoo, rinse your hair and body thoroughly to remove the shampoo.  Use CHG Soap as you would any other liquid soap. You can apply CHG directly to the skin and wash gently with a scrungie or a clean washcloth.    Apply the CHG Soap to your body ONLY FROM THE NECK DOWN.  Do not use on open wounds or open sores. Avoid contact with your eyes, ears, mouth and genitals (private parts). Wash Face and genitals (private parts)  with your normal soap.   Wash thoroughly, paying special attention to the area where your surgery will be performed.  Thoroughly rinse your body with warm water from the neck down.  DO NOT shower/wash with your normal soap after using and rinsing off the CHG Soap.  Pat yourself dry with a CLEAN TOWEL.  Wear CLEAN PAJAMAS to bed the night before surgery  Place CLEAN SHEETS on your bed the night before your surgery  DO NOT SLEEP WITH PETS.   Day of Surgery: Take a shower with CHG soap. Do not wear jewelry or makeup Do not wear lotions, powders, perfumes, or deodorant. Do not shave 48 hours prior to surgery.   Do not bring valuables to the hospital.  [facility] is not responsible for any belongings or valuables. Do not wear nail polish, gel polish, artificial nails, or any other type of covering on natural nails (fingers and toes) If you have artificial nails or gel coating that need to be removed by a nail salon, please have this removed prior to surgery. Artificial nails or gel coating may interfere with anesthesia's ability to adequately monitor your vital signs. Wear Clean/Comfortable clothing the morning of surgery Remember to brush your teeth WITH YOUR REGULAR TOOTHPASTE.   Please read over the following fact sheets that you were given.    If you received a COVID test during your pre-op visit  it is requested that you wear a mask when out in public, stay away from anyone that may not be feeling well and notify your surgeon if you develop symptoms. If you have been in contact with anyone that has tested positive in the last 10 days please notify you surgeon.

## 2022-01-08 ENCOUNTER — Other Ambulatory Visit: Payer: Self-pay | Admitting: General Surgery

## 2022-01-08 ENCOUNTER — Other Ambulatory Visit: Payer: Self-pay

## 2022-01-08 ENCOUNTER — Encounter (HOSPITAL_COMMUNITY)
Admission: RE | Admit: 2022-01-08 | Discharge: 2022-01-08 | Disposition: A | Discharge status: Home / Self Care | Payer: Medicare Other | Source: Ambulatory Visit | Attending: General Surgery | Admitting: General Surgery

## 2022-01-08 ENCOUNTER — Encounter (HOSPITAL_COMMUNITY): Payer: Self-pay

## 2022-01-08 DIAGNOSIS — Z01818 Encounter for other preprocedural examination: Secondary | ICD-10-CM | POA: Insufficient documentation

## 2022-01-08 DIAGNOSIS — C50412 Malignant neoplasm of upper-outer quadrant of left female breast: Secondary | ICD-10-CM | POA: Insufficient documentation

## 2022-01-08 DIAGNOSIS — Z170 Estrogen receptor positive status [ER+]: Secondary | ICD-10-CM | POA: Insufficient documentation

## 2022-01-08 HISTORY — DX: Fatty (change of) liver, not elsewhere classified: K76.0

## 2022-01-08 LAB — COMPREHENSIVE METABOLIC PANEL
ALT: 15 U/L (ref 0–44)
AST: 16 U/L (ref 15–41)
Albumin: 3.4 g/dL — ABNORMAL LOW (ref 3.5–5.0)
Alkaline Phosphatase: 158 U/L — ABNORMAL HIGH (ref 38–126)
Anion gap: 11 (ref 5–15)
BUN: 10 mg/dL (ref 8–23)
CO2: 25 mmol/L (ref 22–32)
Calcium: 9.4 mg/dL (ref 8.9–10.3)
Chloride: 104 mmol/L (ref 98–111)
Creatinine, Ser: 0.59 mg/dL (ref 0.44–1.00)
GFR, Estimated: >60 mL/min (ref >60)
Glucose, Bld: 93 mg/dL (ref 70–99)
Potassium: 4.3 mmol/L (ref 3.5–5.1)
Sodium: 140 mmol/L (ref 135–145)
Total Bilirubin: 0.5 mg/dL (ref 0.3–1.2)
Total Protein: 6.7 g/dL (ref 6.5–8.1)

## 2022-01-08 LAB — CBC WITH DIFFERENTIAL/PLATELET
Abs Immature Granulocytes: 0.03 10*3/uL (ref 0.00–0.07)
Basophils Absolute: 0 10*3/uL (ref 0.0–0.1)
Basophils Relative: 1 %
Eosinophils Absolute: 0.1 10*3/uL (ref 0.0–0.5)
Eosinophils Relative: 2 %
HCT: 38.1 % (ref 36.0–46.0)
Hemoglobin: 11.5 g/dL — ABNORMAL LOW (ref 12.0–15.0)
Immature Granulocytes: 0 %
Lymphocytes Relative: 19 %
Lymphs Abs: 1.3 10*3/uL (ref 0.7–4.0)
MCH: 24 pg — ABNORMAL LOW (ref 26.0–34.0)
MCHC: 30.2 g/dL (ref 30.0–36.0)
MCV: 79.5 fL — ABNORMAL LOW (ref 80.0–100.0)
Monocytes Absolute: 0.7 10*3/uL (ref 0.1–1.0)
Monocytes Relative: 10 %
Neutro Abs: 4.6 10*3/uL (ref 1.7–7.7)
Neutrophils Relative %: 68 %
Platelets: 261 10*3/uL (ref 150–400)
RBC: 4.79 MIL/uL (ref 3.87–5.11)
RDW: 16.7 % — ABNORMAL HIGH (ref 11.5–15.5)
WBC: 6.8 10*3/uL (ref 4.0–10.5)
nRBC: 0 % (ref 0.0–0.2)

## 2022-01-08 NOTE — Progress Notes (Signed)
PCP - [place] Cardiologist - denies  PPM/ICD - denies   Chest x-ray - 01/02/22 EKG - 09/28/21 Stress Test - denies ECHO - denies Cardiac Cath - denies  Sleep Study - denies  DM- denies   ASA/Blood Thinner Instructions: n/a   ERAS Protcol - yes PRE-SURGERY Ensure given at PAT  COVID TEST- n/a   Anesthesia review: no  Patient denies shortness of breath, fever, cough and chest pain at PAT appointment   All instructions explained to the patient, with a verbal understanding of the material. Patient agrees to go over the instructions while at home for a better understanding. The opportunity to ask questions was provided.

## 2022-01-14 ENCOUNTER — Ambulatory Visit
Admission: RE | Admit: 2022-01-14 | Discharge: 2022-01-14 | Disposition: A | Discharge status: Home / Self Care | Payer: Medicaid Other | Source: Ambulatory Visit | Attending: General Surgery | Admitting: General Surgery

## 2022-01-14 ENCOUNTER — Other Ambulatory Visit: Payer: Self-pay | Admitting: General Surgery

## 2022-01-14 DIAGNOSIS — C50912 Malignant neoplasm of unspecified site of left female breast: Secondary | ICD-10-CM

## 2022-01-14 DIAGNOSIS — D241 Benign neoplasm of right breast: Secondary | ICD-10-CM

## 2022-01-14 DIAGNOSIS — Z170 Estrogen receptor positive status [ER+]: Secondary | ICD-10-CM

## 2022-01-14 NOTE — Anesthesia Preprocedure Evaluation (Signed)
Anesthesia Evaluation  Patient identified by MRN, date of birth, ID band Patient awake    Reviewed: Allergy & Precautions, NPO status , Patient's Chart, lab work & pertinent test results  History of Anesthesia Complications Negative for: history of anesthetic complications  Airway Mallampati: II  TM Distance: >3 FB Neck ROM: Full    Dental  (+) Edentulous Upper, Poor Dentition, Missing, Dental Advisory Given, Loose   Pulmonary neg pulmonary ROS,    breath sounds clear to auscultation       Cardiovascular negative cardio ROS   Rhythm:Regular Rate:Normal     Neuro/Psych  Headaches, Depression    GI/Hepatic negative GI ROS, (+)     substance abuse  alcohol use,   Endo/Other  Morbid obesity  Renal/GU negative Renal ROS     Musculoskeletal   Abdominal (+) + obese,   Peds  Hematology negative hematology ROS (+)   Anesthesia Other Findings Breast cancer  Reproductive/Obstetrics                            Anesthesia Physical Anesthesia Plan  ASA: 3  Anesthesia Plan: General   Post-op Pain Management: Regional block* and Tylenol PO (pre-op)*   Induction: Intravenous  PONV Risk Score and Plan: 3 and Ondansetron, Dexamethasone and Scopolamine patch - Pre-op  Airway Management Planned: LMA  Additional Equipment: None  Intra-op Plan:   Post-operative Plan:   Informed Consent: I have reviewed the patients History and Physical, chart, labs and discussed the procedure including the risks, benefits and alternatives for the proposed anesthesia with the patient or authorized representative who has indicated his/her understanding and acceptance.     Dental advisory given  Plan Discussed with: CRNA and Surgeon  Anesthesia Plan Comments: (Plan routine monitors, GA with pec block for post op analgesia)       Anesthesia Quick Evaluation

## 2022-01-15 ENCOUNTER — Ambulatory Visit (HOSPITAL_COMMUNITY)
Admission: RE | Admit: 2022-01-15 | Discharge: 2022-01-15 | Disposition: A | Discharge status: Home / Self Care | Payer: Medicare Other | Attending: General Surgery | Admitting: General Surgery

## 2022-01-15 ENCOUNTER — Other Ambulatory Visit: Payer: Self-pay

## 2022-01-15 ENCOUNTER — Encounter (HOSPITAL_COMMUNITY): Payer: Self-pay | Admitting: General Surgery

## 2022-01-15 ENCOUNTER — Ambulatory Visit (HOSPITAL_COMMUNITY): Payer: Medicare Other | Admitting: Vascular Surgery

## 2022-01-15 ENCOUNTER — Ambulatory Visit
Admission: RE | Admit: 2022-01-15 | Discharge: 2022-01-15 | Disposition: A | Discharge status: Home / Self Care | Payer: Medicaid Other | Source: Ambulatory Visit | Attending: General Surgery | Admitting: General Surgery

## 2022-01-15 ENCOUNTER — Encounter (HOSPITAL_COMMUNITY)
Admission: RE | Disposition: A | Discharge status: Home / Self Care | Payer: Self-pay | Source: Home / Self Care | Attending: General Surgery

## 2022-01-15 ENCOUNTER — Ambulatory Visit (HOSPITAL_BASED_OUTPATIENT_CLINIC_OR_DEPARTMENT_OTHER): Payer: Medicare Other | Admitting: Anesthesiology

## 2022-01-15 DIAGNOSIS — Z170 Estrogen receptor positive status [ER+]: Secondary | ICD-10-CM | POA: Insufficient documentation

## 2022-01-15 DIAGNOSIS — C50412 Malignant neoplasm of upper-outer quadrant of left female breast: Secondary | ICD-10-CM

## 2022-01-15 DIAGNOSIS — Z6834 Body mass index (BMI) 34.0-34.9, adult: Secondary | ICD-10-CM

## 2022-01-15 DIAGNOSIS — C50812 Malignant neoplasm of overlapping sites of left female breast: Secondary | ICD-10-CM | POA: Diagnosis present

## 2022-01-15 DIAGNOSIS — N6452 Nipple discharge: Secondary | ICD-10-CM | POA: Diagnosis not present

## 2022-01-15 DIAGNOSIS — C773 Secondary and unspecified malignant neoplasm of axilla and upper limb lymph nodes: Secondary | ICD-10-CM | POA: Diagnosis not present

## 2022-01-15 DIAGNOSIS — C50512 Malignant neoplasm of lower-outer quadrant of left female breast: Secondary | ICD-10-CM

## 2022-01-15 DIAGNOSIS — N6489 Other specified disorders of breast: Secondary | ICD-10-CM | POA: Insufficient documentation

## 2022-01-15 HISTORY — PX: RADIOACTIVE SEED GUIDED EXCISIONAL BREAST BIOPSY: SHX6490

## 2022-01-15 HISTORY — PX: BREAST LUMPECTOMY WITH RADIOACTIVE SEED AND SENTINEL LYMPH NODE BIOPSY: SHX6550

## 2022-01-15 SURGERY — BREAST LUMPECTOMY WITH RADIOACTIVE SEED AND SENTINEL LYMPH NODE BIOPSY
Anesthesia: General | Site: Breast | Laterality: Right

## 2022-01-15 MED ORDER — OXYCODONE HCL 5 MG PO TABS
ORAL_TABLET | ORAL | Status: AC
  Filled 2022-01-15: qty 1

## 2022-01-15 MED ORDER — ACETAMINOPHEN 500 MG PO TABS
1000.0000 mg | ORAL_TABLET | ORAL | Status: AC
  Administered 2022-01-15: 1000 mg via ORAL
  Filled 2022-01-15: qty 2

## 2022-01-15 MED ORDER — CHLORHEXIDINE GLUCONATE 0.12 % MT SOLN
OROMUCOSAL | Status: AC
  Administered 2022-01-15: 15 mL
  Filled 2022-01-15: qty 15

## 2022-01-15 MED ORDER — LACTATED RINGERS IV SOLN: INTRAVENOUS | Status: DC | PRN

## 2022-01-15 MED ORDER — CEFAZOLIN SODIUM-DEXTROSE 2-4 GM/100ML-% IV SOLN
2.0000 g | INTRAVENOUS | Status: AC
  Administered 2022-01-15: 2 g via INTRAVENOUS
  Filled 2022-01-15: qty 100

## 2022-01-15 MED ORDER — MIDAZOLAM HCL 2 MG/2ML IJ SOLN
INTRAMUSCULAR | Status: AC
  Filled 2022-01-15: qty 2

## 2022-01-15 MED ORDER — SCOPOLAMINE 1 MG/3DAYS TD PT72
1.0000 | MEDICATED_PATCH | TRANSDERMAL | Status: DC
  Administered 2022-01-15: 1.5 mg via TRANSDERMAL
  Filled 2022-01-15: qty 1

## 2022-01-15 MED ORDER — PHENYLEPHRINE HCL-NACL 20-0.9 MG/250ML-% IV SOLN
INTRAVENOUS | Status: DC | PRN
  Administered 2022-01-15: 50 ug/min via INTRAVENOUS

## 2022-01-15 MED ORDER — PROPOFOL 10 MG/ML IV BOLUS
INTRAVENOUS | Status: AC
Start: 2022-01-15 — End: ?
  Filled 2022-01-15: qty 20

## 2022-01-15 MED ORDER — LIDOCAINE 2% (20 MG/ML) 5 ML SYRINGE
INTRAMUSCULAR | Status: AC
  Filled 2022-01-15: qty 5

## 2022-01-15 MED ORDER — PROMETHAZINE HCL 25 MG/ML IJ SOLN: 6.2500 mg | INTRAMUSCULAR | Status: DC | PRN

## 2022-01-15 MED ORDER — HYDROMORPHONE HCL 1 MG/ML IJ SOLN
0.2500 mg | INTRAMUSCULAR | Status: DC | PRN
  Administered 2022-01-15 (×4): 0.5 mg via INTRAVENOUS

## 2022-01-15 MED ORDER — DEXAMETHASONE SODIUM PHOSPHATE 10 MG/ML IJ SOLN
INTRAMUSCULAR | Status: AC
  Filled 2022-01-15: qty 1

## 2022-01-15 MED ORDER — ORAL CARE MOUTH RINSE
15.0000 mL | Freq: Once | OROMUCOSAL | Status: AC
Start: 2022-01-15 — End: 2022-01-15

## 2022-01-15 MED ORDER — MIDAZOLAM HCL 2 MG/2ML IJ SOLN
INTRAMUSCULAR | Status: AC
Start: 2022-01-15 — End: ?
  Filled 2022-01-15: qty 2

## 2022-01-15 MED ORDER — OXYCODONE HCL 5 MG PO TABS: 5.0000 mg | ORAL_TABLET | Freq: Four times a day (QID) | ORAL | 0 refills | Status: DC | PRN

## 2022-01-15 MED ORDER — CHLORHEXIDINE GLUCONATE 0.12 % MT SOLN: 15.0000 mL | Freq: Once | OROMUCOSAL | Status: AC

## 2022-01-15 MED ORDER — ENSURE PRE-SURGERY PO LIQD: 296.0000 mL | Freq: Once | ORAL | Status: DC

## 2022-01-15 MED ORDER — HYDROMORPHONE HCL 1 MG/ML IJ SOLN
INTRAMUSCULAR | Status: AC
  Filled 2022-01-15: qty 1

## 2022-01-15 MED ORDER — PROPOFOL 10 MG/ML IV BOLUS
INTRAVENOUS | Status: DC | PRN
  Administered 2022-01-15: 160 mg via INTRAVENOUS

## 2022-01-15 MED ORDER — OXYCODONE HCL 5 MG/5ML PO SOLN: 5.0000 mg | Freq: Once | ORAL | Status: AC | PRN

## 2022-01-15 MED ORDER — OXYCODONE HCL 5 MG PO TABS
5.0000 mg | ORAL_TABLET | Freq: Once | ORAL | Status: AC | PRN
  Administered 2022-01-15: 5 mg via ORAL

## 2022-01-15 MED ORDER — MEPERIDINE HCL 25 MG/ML IJ SOLN: 6.2500 mg | INTRAMUSCULAR | Status: DC | PRN

## 2022-01-15 MED ORDER — LIDOCAINE HCL (PF) 1 % IJ SOLN
INTRAMUSCULAR | Status: AC
Start: 2022-01-15 — End: ?
  Filled 2022-01-15: qty 30

## 2022-01-15 MED ORDER — LIDOCAINE HCL 1 % IJ SOLN
INTRAMUSCULAR | Status: DC | PRN
  Administered 2022-01-15: 10 mL

## 2022-01-15 MED ORDER — LACTATED RINGERS IV SOLN: INTRAVENOUS | Status: DC

## 2022-01-15 MED ORDER — MIDAZOLAM HCL 2 MG/2ML IJ SOLN: 0.5000 mg | Freq: Once | INTRAMUSCULAR | Status: DC | PRN

## 2022-01-15 MED ORDER — DEXAMETHASONE SODIUM PHOSPHATE 10 MG/ML IJ SOLN
INTRAMUSCULAR | Status: DC | PRN
  Administered 2022-01-15: 10 mg via INTRAVENOUS

## 2022-01-15 MED ORDER — ONDANSETRON HCL 4 MG/2ML IJ SOLN
INTRAMUSCULAR | Status: AC
  Filled 2022-01-15: qty 2

## 2022-01-15 MED ORDER — GABAPENTIN 100 MG PO CAPS
100.0000 mg | ORAL_CAPSULE | ORAL | Status: AC
  Administered 2022-01-15: 100 mg via ORAL
  Filled 2022-01-15: qty 1

## 2022-01-15 MED ORDER — FENTANYL CITRATE (PF) 100 MCG/2ML IJ SOLN
INTRAMUSCULAR | Status: DC | PRN
  Administered 2022-01-15 (×2): 25 ug via INTRAVENOUS
  Administered 2022-01-15: 50 ug via INTRAVENOUS
  Administered 2022-01-15 (×2): 25 ug via INTRAVENOUS

## 2022-01-15 MED ORDER — PHENYLEPHRINE HCL (PRESSORS) 10 MG/ML IV SOLN
INTRAVENOUS | Status: DC | PRN
  Administered 2022-01-15: 100 ug via INTRAVENOUS

## 2022-01-15 MED ORDER — LIDOCAINE 2% (20 MG/ML) 5 ML SYRINGE
INTRAMUSCULAR | Status: DC | PRN
  Administered 2022-01-15: 30 mg via INTRAVENOUS

## 2022-01-15 MED ORDER — 0.9 % SODIUM CHLORIDE (POUR BTL) OPTIME
TOPICAL | Status: DC | PRN
  Administered 2022-01-15: 1000 mL

## 2022-01-15 MED ORDER — PROPOFOL 10 MG/ML IV BOLUS
INTRAVENOUS | Status: AC
  Filled 2022-01-15: qty 20

## 2022-01-15 MED ORDER — CHLORHEXIDINE GLUCONATE CLOTH 2 % EX PADS: 6.0000 | MEDICATED_PAD | Freq: Once | CUTANEOUS | Status: DC

## 2022-01-15 MED ORDER — MIDAZOLAM HCL 2 MG/2ML IJ SOLN
INTRAMUSCULAR | Status: DC | PRN
  Administered 2022-01-15: 2 mg via INTRAVENOUS

## 2022-01-15 MED ORDER — MAGTRACE LYMPHATIC TRACER
INTRAMUSCULAR | Status: DC | PRN
  Administered 2022-01-15: 2 mL via INTRAMUSCULAR

## 2022-01-15 MED ORDER — ACETAMINOPHEN 500 MG PO TABS: 1000.0000 mg | ORAL_TABLET | Freq: Once | ORAL | Status: DC

## 2022-01-15 MED ORDER — BUPIVACAINE-EPINEPHRINE (PF) 0.25% -1:200000 IJ SOLN
INTRAMUSCULAR | Status: AC
  Filled 2022-01-15: qty 30

## 2022-01-15 MED ORDER — ONDANSETRON HCL 4 MG/2ML IJ SOLN
INTRAMUSCULAR | Status: DC | PRN
  Administered 2022-01-15: 4 mg via INTRAVENOUS

## 2022-01-15 MED ORDER — FENTANYL CITRATE (PF) 100 MCG/2ML IJ SOLN
INTRAMUSCULAR | Status: AC
  Filled 2022-01-15: qty 2

## 2022-01-15 MED ORDER — BUPIVACAINE-EPINEPHRINE (PF) 0.5% -1:200000 IJ SOLN
INTRAMUSCULAR | Status: DC | PRN
  Administered 2022-01-15: 30 mL

## 2022-01-15 MED ORDER — FENTANYL CITRATE (PF) 250 MCG/5ML IJ SOLN
INTRAMUSCULAR | Status: AC
  Filled 2022-01-15: qty 5

## 2022-01-15 SURGICAL SUPPLY — 52 items
ADH SKN CLS APL DERMABOND .7 (GAUZE/BANDAGES/DRESSINGS) ×2
APL PRP STRL LF DISP 70% ISPRP (MISCELLANEOUS) ×2
BAG COUNTER SPONGE SURGICOUNT (BAG) ×3 IMPLANT
BAG SPNG CNTER NS LX DISP (BAG) ×2
BINDER BREAST XXLRG (GAUZE/BANDAGES/DRESSINGS) ×1 IMPLANT
BNDG COHESIVE 4X5 TAN STRL (GAUZE/BANDAGES/DRESSINGS) ×3 IMPLANT
CANISTER SUCT 3000ML PPV (MISCELLANEOUS) ×3 IMPLANT
CHLORAPREP W/TINT 26 (MISCELLANEOUS) ×3 IMPLANT
CLIP TI LARGE 6 (CLIP) ×4 IMPLANT
CLIP TI MEDIUM 24 (CLIP) ×3 IMPLANT
CNTNR URN SCR LID CUP LEK RST (MISCELLANEOUS) IMPLANT
CONT SPEC 4OZ STRL OR WHT (MISCELLANEOUS)
COVER PROBE W GEL 5X96 (DRAPES) ×3 IMPLANT
COVER SURGICAL LIGHT HANDLE (MISCELLANEOUS) ×3 IMPLANT
DERMABOND ADVANCED (GAUZE/BANDAGES/DRESSINGS) ×1
DERMABOND ADVANCED .7 DNX12 (GAUZE/BANDAGES/DRESSINGS) ×2 IMPLANT
DEVICE DUBIN SPECIMEN MAMMOGRA (MISCELLANEOUS) ×3 IMPLANT
DRAPE CHEST BREAST 15X10 FENES (DRAPES) ×3 IMPLANT
DRAPE SURG 17X23 STRL (DRAPES) IMPLANT
ELECT BLADE 4.0 EZ CLEAN MEGAD (MISCELLANEOUS) ×3
ELECT COATED BLADE 2.86 ST (ELECTRODE) ×3 IMPLANT
ELECT REM PT RETURN 9FT ADLT (ELECTROSURGICAL) ×3
ELECTRODE BLDE 4.0 EZ CLN MEGD (MISCELLANEOUS) IMPLANT
ELECTRODE REM PT RTRN 9FT ADLT (ELECTROSURGICAL) ×2 IMPLANT
GAUZE SPONGE 4X4 12PLY STRL LF (GAUZE/BANDAGES/DRESSINGS) ×1 IMPLANT
GLOVE BIO SURGEON STRL SZ 6 (GLOVE) ×3 IMPLANT
GLOVE INDICATOR 6.5 STRL GRN (GLOVE) ×3 IMPLANT
GOWN STRL REUS W/ TWL LRG LVL3 (GOWN DISPOSABLE) ×2 IMPLANT
GOWN STRL REUS W/TWL 2XL LVL3 (GOWN DISPOSABLE) ×3 IMPLANT
GOWN STRL REUS W/TWL LRG LVL3 (GOWN DISPOSABLE) ×3
KIT BASIN OR (CUSTOM PROCEDURE TRAY) ×3 IMPLANT
KIT MARKER MARGIN INK (KITS) ×3 IMPLANT
LIGHT WAVEGUIDE WIDE FLAT (MISCELLANEOUS) IMPLANT
NDL 18GX1X1/2 (RX/OR ONLY) (NEEDLE) IMPLANT
NDL FILTER BLUNT 18X1 1/2 (NEEDLE) IMPLANT
NDL HYPO 25GX1X1/2 BEV (NEEDLE) ×2 IMPLANT
NEEDLE 18GX1X1/2 (RX/OR ONLY) (NEEDLE) IMPLANT
NEEDLE FILTER BLUNT 18X 1/2SAF (NEEDLE) ×1
NEEDLE FILTER BLUNT 18X1 1/2 (NEEDLE) ×2 IMPLANT
NEEDLE HYPO 25GX1X1/2 BEV (NEEDLE) ×3 IMPLANT
NS IRRIG 1000ML POUR BTL (IV SOLUTION) ×3 IMPLANT
PACK GENERAL/GYN (CUSTOM PROCEDURE TRAY) ×3 IMPLANT
PACK UNIVERSAL I (CUSTOM PROCEDURE TRAY) ×3 IMPLANT
PAD ABD 8X10 STRL (GAUZE/BANDAGES/DRESSINGS) ×2 IMPLANT
STOCKINETTE IMPERVIOUS 9X36 MD (GAUZE/BANDAGES/DRESSINGS) ×3 IMPLANT
STRIP CLOSURE SKIN 1/4X4 (GAUZE/BANDAGES/DRESSINGS) ×1 IMPLANT
SUT MNCRL AB 4-0 PS2 18 (SUTURE) ×3 IMPLANT
SUT VIC AB 3-0 SH 8-18 (SUTURE) ×3 IMPLANT
SYR CONTROL 10ML LL (SYRINGE) ×3 IMPLANT
TOWEL GREEN STERILE (TOWEL DISPOSABLE) ×3 IMPLANT
TOWEL GREEN STERILE FF (TOWEL DISPOSABLE) ×3 IMPLANT
TRACER MAGTRACE VIAL (MISCELLANEOUS) ×1 IMPLANT

## 2022-01-15 NOTE — Discharge Instructions (Addendum)
[facility] Office Phone Number [phone]  BREAST BIOPSY/ PARTIAL MASTECTOMY: POST OP INSTRUCTIONS  Always review your discharge instruction sheet given to you by the facility where your surgery was performed.  IF YOU HAVE DISABILITY OR FAMILY LEAVE FORMS, YOU MUST BRING THEM TO THE OFFICE FOR PROCESSING.  DO NOT GIVE THEM TO YOUR DOCTOR.  Take 2 tylenol (acetominophen) three times a day for 3 days.  If you still have pain, add ibuprofen with food in between if able to take this (if you have kidney issues or stomach issues, do not take ibuprofen).  If both of those are not enough, add the narcotic pain pill.  If you find you are needing a lot of this overnight after surgery, call the next morning for a refill.    Prescriptions will not be filled after 5pm or on week-ends. Take your usually prescribed medications unless otherwise directed You should eat very light the first 24 hours after surgery, such as soup, crackers, pudding, etc.  Resume your normal diet the day after surgery. Most patients will experience some swelling and bruising in the breast.  Ice packs and a good support bra will help.  Swelling and bruising can take several days to resolve.  It is common to experience some constipation if taking pain medication after surgery.  Increasing fluid intake and taking a stool softener will usually help or prevent this problem from occurring.  A mild laxative (Milk of Magnesia or Miralax) should be taken according to package directions if there are no bowel movements after 48 hours. Unless discharge instructions indicate otherwise, you may remove your bandages 48 hours after surgery, and you may shower at that time.  You may have steri-strips (small skin tapes) in place directly over the incision.  These strips should be left on the skin at least for for 7-10 days.    ACTIVITIES:  You may resume regular daily activities (gradually increasing) beginning the next day.  Wearing a  good support bra or sports bra (or the breast binder) minimizes pain and swelling.  You may have sexual intercourse when it is comfortable. No heavy lifting for 1-2 weeks (not over around 10 pounds).  You may drive when you no longer are taking prescription pain medication, you can comfortably wear a seatbelt, and you can safely maneuver your car and apply brakes. RETURN TO WORK:  __________3-14 days depending on job. _______________ You should see your doctor in the office for a follow-up appointment approximately two weeks after your surgery.  Your doctor's nurse will typically make your follow-up appointment when she calls you with your pathology report.  Expect your pathology report 3-4 business days after your surgery.  You may call to check if you do not hear from us after three days.   WHEN TO CALL YOUR DOCTOR: Fever over 101.0 Nausea and/or vomiting. Extreme swelling or bruising. Continued bleeding from incision. Increased pain, redness, or drainage from the incision.  The clinic staff is available to answer your questions during regular business hours.  Please don't hesitate to call and ask to speak to one of the nurses for clinical concerns.  If you have a medical emergency, go to the nearest emergency room or call 911.  A surgeon from [facility] is always on call at the hospital.  For further questions, please visit [email or web address]   

## 2022-01-15 NOTE — Anesthesia Procedure Notes (Signed)
Procedure Name: LMA Insertion Date/Time: 01/15/2022 9:39 AM  Performed by: Eligha Bridegroom, CRNAPre-anesthesia Checklist: Patient identified, Emergency Drugs available, Suction available, Patient being monitored and Timeout performed Patient Re-evaluated:Patient Re-evaluated prior to induction Oxygen Delivery Method: Circle system utilized Preoxygenation: Pre-oxygenation with 100% oxygen Induction Type: IV induction LMA: LMA inserted LMA Size: 4.0 Number of attempts: 1 Placement Confirmation: breath sounds checked- equal and bilateral and positive ETCO2 Tube secured with: Tape Dental Injury: Teeth and Oropharynx as per pre-operative assessment

## 2022-01-15 NOTE — H&P (Signed)
REFERRING PHYSICIAN: Felicita Gage, MD  PROVIDER: Georgianne Fick, MD  Care Team: Patient Care Team: None as PCP - General   MRN: [id number] DOB: April 11, 1956   Subjective   Chief Complaint: Breast Cancer   History of Present Illness: Tracie Atkins is a 66 y.o. female who is seen today as an office consultation at the request of Dr. Dorian Pod for evaluation of Breast Cancer  Patient has a new diagnosis of left breast cancer 11/2021. She presented with a palpable left breast mass and right nipple discharge. She subsequently underwent diagnostic mammography. She was seen to have a 2.2 to 2.6 cm mass at 3:00 on the left quite lateral. She is also seen to have 2 abnormal left axillary lymph nodes. She had an ectatic duct on the right, but no masses seen. She had biopsies of the node and the mass on the left. She was seen to have grade 2 invasive ductal carcinoma. The breast biopsy was strongly ER positive, HER2 negative, Ki-67 of 10%, and HER2 negative. The axillary lymph node was strongly ER positive moderately positive for PR and negative for HER2 with a similar Ki-67.  Given the lack of findings on imaging, MRI was recommended for work-up of the right nipple discharge. She also saw endocrinology. Her prolactin was elevated at 71. Endocrinology had a brain MRI performed and this showed a possible 4 mm pituitary microadenoma.  Patient is accompanied by 2 children and a daughter-in-law. She denies any family history of cancer.  Diagnostic mammogram/us 09/30/2021 ACR Breast Density Category b: There are scattered areas of fibroglandular density.   FINDINGS: RIGHT breast diagnostic mammogram: There is a solitary tubular-appearing structure within the subareolar RIGHT breast. There are no dominant masses, suspicious calcifications or secondary signs of malignancy elsewhere within the RIGHT breast.   LEFT breast diagnostic mammogram: There is a spiculated mass within the outer LEFT breast,  measuring approximately 2.6 cm, corresponding to the palpable area of concern. There is a probably enlarged lymph node in the LEFT axilla, incompletely imaged at the upper aspects of this exam. There are no dominant masses, suspicious calcifications or secondary signs of malignancy identified elsewhere within the LEFT breast.   RIGHT breast: Targeted ultrasound is performed, showing a focally dilated duct within the subareolar RIGHT breast without evidence of intraductal mass or vascularity. No suspicious solid or cystic mass is identified within the subareolar RIGHT breast.   LEFT breast: Targeted ultrasound is performed, showing an irregular mass at the 3 o'clock axis, 10 cm from the nipple, measuring 2.2 x 2.0 x 2.1 cm, corresponding to the mammographic finding.   There are 2 morphologically abnormal lymph nodes identified in the LEFT axilla.   There are no abnormal skin changes, blistering, peeling or peau d'orange at the RIGHT nipple.   IMPRESSION: 1. Patient describes suspicious RIGHT-sided nipple discharge which is clear in color and spontaneous. There is a focally dilated duct within the subareolar RIGHT breast, but no evidence of intraductal mass or vascularity. Causes of unilateral nipple discharge include: Hormonal changes, fibrocystic changes, benign papilloma, abscess/mastitis, birth control pills, endocrine disorders, injury/trauma to breast, duct ectasia, medications, prolactinoma, and breast cancer. As is evident from this list, nipple discharge often stems from a benign condition, however, breast cancer is a possibility when unilateral spontaneous persistent single duct discharge (especially bloody or clear discharge) is present. As such, breast MRI recommended to exclude malignancy. 2. Highly suspicious mass within the LEFT breast at the 3 o'clock axis, 10 cm from the nipple,  measuring 2.2 cm. Ultrasound-guided biopsy is recommended. 3. Two morphologically  abnormal lymph nodes in the LEFT axilla, suspicious for metastatic disease. Ultrasound-guided biopsy is recommended for the largest of these lymph nodes.   RECOMMENDATION: 1. Breast MRI with contrast for further characterization of patient's suspicious nipple discharge. Patient will be scheduled for breast MRI as soon as possible.   Recommendation for breast MRI based on data provided by the [employer] of Radiology Appropriateness Criteria, produced by an expert panel on breast imaging, showing breast MRI with contrast to have a high sensitivity and NPV for identifying the cause of pathologic nipple discharge.   2. Ultrasound-guided biopsies for the LEFT breast mass at the 3 o'clock axis and an enlarged lymph node in the LEFT axilla. Ultrasound-guided biopsies will be scheduled in approximately 3 weeks to allow time for the breast MRI to be obtained before the biopsies.   Ultrasound-guided biopsies are scheduled on April 10th.   I have discussed the findings and recommendations with the patient. If applicable, a reminder letter will be sent to the patient regarding the next appointment.   BI-RADS CATEGORY 5: Highly suggestive of malignancy.  Pathology core needle biopsy: 11/05/2021 1. Breast, left, needle core biopsy, 3 o'clock - INVASIVE MAMMARY CARCINOMA 2. Lymph node, needle/core biopsy, left axilla - INVASIVE DUCTAL CARCINOMA - NO NODAL TISSUE IDENTIFIED 1. The biopsy material shows an infiltrative proliferation of cells arranged linearly and in small clusters. Based on the biopsy, the carcinoma appears Nottingham grade 2 of 3 and measures 2.0 cm in greatest linear extent. 1. E-cadherin is POSITIVE supporting a ductal origin.  Receptors/breast Estrogen Receptor: 100%, POSITIVE, STRONG STAINING INTENSITY Progesterone Receptor: 0%, NEGATIVE Proliferation Marker Ki67: 10% GROUP 5: HER2 **NEGATIVE**  Axilla similar:  Estrogen Receptor: 100%, POSITIVE, STRONG STAINING  INTENSITY Progesterone Receptor: 20%, POSITIVE, MODERATE STAINING INTENSITY Proliferation Marker Ki67: 10% GROUP 5: HER2 **NEGATIVE**  Brain MR 11/04/2021 IMPRESSION: 1. Possible 4 mm lesion in the far left lateral aspect of the pituitary gland which may reflect a microadenoma. 2. Severely age advanced cerebral white matter T2 signal changes, nonspecific though most often seen with chronic small vessel ischemia.    Review of Systems: A complete review of systems was obtained from the patient. I have reviewed this information and discussed as appropriate with the patient. See HPI as well for other ROS.  Review of Systems  Gastrointestinal: Positive for diarrhea.  Neurological: Positive for headaches.  Psychiatric/Behavioral: Positive for depression and memory loss.  All other systems reviewed and are negative.   Medical History: History reviewed. No pertinent past medical history.  Patient Active Problem List  Diagnosis   Malignant neoplasm of upper-outer quadrant of left breast in female, estrogen receptor positive (CMS-HCC)   Breast cancer metastasized to axillary lymph node, left (CMS-HCC)   Discharge from right nipple   History reviewed. No pertinent surgical history.   No Known Allergies  No current outpatient medications on file prior to visit.   No current facility-administered medications on file prior to visit.   Family History  Problem Relation Age of Onset   Stroke Mother   Coronary Artery Disease (Blocked arteries around heart) Mother    Social History   Tobacco Use  Smoking Status Never  Smokeless Tobacco Never    Social History   Socioeconomic History   Marital status: Single  Tobacco Use   Smoking status: Never   Smokeless tobacco: Never  Substance and Sexual Activity   Alcohol use: Never   Drug use:  Never   Objective:   Vitals:  BP: (!) 160/90  Pulse: 108  Temp: (!) 37.9 C (100.3 F)  SpO2: 98%  Weight: 90.4 kg (199 lb 3.2 oz)   Height: 162.6 cm (5' 4" )   Body mass index is 34.19 kg/m.  Gen: No acute distress. Well nourished and well groomed.  Neurological: Alert and oriented to person, place, and time. Coordination normal.  Head: Normocephalic and atraumatic.  Eyes: Conjunctivae are normal. Pupils are equal, round, and reactive to light. No scleral icterus.  Neck: Normal range of motion. Neck supple. No tracheal deviation or thyromegaly present.  Cardiovascular: Normal rate, regular rhythm, normal heart sounds and intact distal pulses. Exam reveals no gallop and no friction rub. No murmur heard. Breast: indistinct palpable mass 3 o'clock left breast. No palpable nodes on that side. No left nipple retraction or discharge. No skin dimpling. Right side with spontaneous clear nipple discharge. No masses, LAD, nipple discharge on right.  Respiratory: Effort normal. No respiratory distress. No chest wall tenderness. Breath sounds normal. No wheezes, rales or rhonchi.  GI: Soft. Bowel sounds are normal. The abdomen is soft and nontender. There is no rebound and no guarding.  Musculoskeletal: Normal range of motion. Extremities are nontender.  Lymphadenopathy: No cervical, preauricular, postauricular or axillary adenopathy is present Skin: Skin is warm and dry. No rash noted. No diaphoresis. No erythema. No pallor. No clubbing, cyanosis, or edema.  Psychiatric: Normal mood and affect. Behavior is normal. Judgment and thought content normal.   Labs n/a  Assessment and Plan:   Malignant neoplasm of upper-outer quadrant of left breast in female, estrogen receptor positive (CMS-HCC) (primary encounter diagnosis) Plan: Ambulatory Referral to Oncology-Medical,  Ambulatory Referral to Radiation Oncology  Breast cancer metastasized to axillary lymph node, left (CMS-HCC) Plan: Ambulatory Referral to Oncology-Medical,  Ambulatory Referral to Radiation Oncology  Discharge from right nipple  I discussed with the patient  that we could delay the work-up of the clear nipple discharge and just take care of the left sided cancer. However, it would be most prudent to go ahead and get the breast MRI prior to surgery. I discussed that many people require additional biopsies after the MRI. I wanted to check as long as the MRI does not show anything doing okay surprising, but we would plan to do a lumpectomy on the left and associated with a targeted lymph node biopsy with a sentinel lymph node biopsy. Should we need a seed localized biopsy on the right we would do that at the same time.  She had the MRI ordered a bit ago, but it has not been scheduled. The attempted to facilitate the scheduling today in clinic. Additional phone numbers were placed on her HiPPA but in order to facilitate getting a date and time for this.  I will refer her to oncology and radiation. She will likely get an oncotype or mammaprint post op then +/- chemo, radiation, and antihormonal tx.   The surgical procedure was described to the patient. I discussed the incision type and location and that we would need radiology involved on with a wire or seed marker and/or sentinel node.   The risks and benefits of the procedure were described to the patient and she wishes to proceed.   We discussed the risks bleeding, infection, damage to other structures, need for further procedures/surgeries. We discussed the risk of seroma. The patient was advised if the area in the breast in cancer, we may need to go back to  surgery for additional tissue to obtain negative margins or for a lymph node biopsy. The patient was advised that these are the most common complications, but that others can occur as well. They were advised against taking aspirin or other anti-inflammatory agents/blood thinners the week before surgery.   No follow-ups on file.  Milus Height, MD FACS Surgical Oncology, General Surgery, Trauma and [facility]

## 2022-01-15 NOTE — Interval H&P Note (Signed)
History and Physical Interval Note:  01/15/2022 9:22 AM  Tracie Atkins  has presented today for surgery, with the diagnosis of LEFT BREAST CANCER.  The various methods of treatment have been discussed with the patient and family. After consideration of risks, benefits and other options for treatment, the patient has consented to  Procedure(s): LEFT BREAST SEED LOCALIZED LUMPECTOMY, LEFT SEED TARGETED AXILLARY LYMPH NODE BIOPSY, LEFT SENTINEL LYMPH NODE BIOPSY (Left) RIGHT BREAST SEED LOCALIZED EXCISIONAL BIOPSY (Right) as a surgical intervention.  The patient's history has been reviewed, patient examined, no change in status, stable for surgery.  I have reviewed the patient's chart and labs.  Questions were answered to the patient's satisfaction.     Stark Klein

## 2022-01-15 NOTE — Anesthesia Procedure Notes (Signed)
Anesthesia Regional Block: Pectoralis block   Pre-Anesthetic Checklist: , timeout performed,  Correct Patient, Correct Site, Correct Laterality,  Correct Procedure, Correct Position, site marked,  Risks and benefits discussed,  Surgical consent,  Pre-op evaluation,  At surgeon's request and post-op pain management  Laterality: Left  Prep: chloraprep       Needles:  Injection technique: Single-shot  Needle Type: Echogenic Needle     Needle Length: 9cm  Needle Gauge: 21     Additional Needles:   Procedures:,,,, ultrasound used (permanent image in chart),,    Narrative:  Start time: 01/15/2022 9:04 AM End time: 01/15/2022 9:10 AM Injection made incrementally with aspirations every 5 mL.  Performed by: Personally  Anesthesiologist: Annye Asa, MD  Additional Notes: Pt identified in Holding room.  Monitors applied. Working IV access confirmed. Sterile prep L clavicle and pec.  #21ga ECHOgenic Arrow block needle between pec major and pec minor, between ribs 4,5 with US guidance.  30cc 0.5% Bupivacaine 1:200k epi injected incrementally after negative test dose.  Patient asymptomatic, VSS, no heme aspirated, tolerated well.   Jenita Seashore, MD

## 2022-01-15 NOTE — Op Note (Signed)
[facility] Breast Radioactive seed localized lumpectomy, left axillary seed localized lymph node biopsy, left sentinel lymph node biopsy, right breast seed localized excisional biopsy  Indications: This patient presents with history of left breast cancer, LOQ, cT2N1, +/+/- grade 2 invasive ductal carcinoma, Ki 67 10%.  Also, pt had right nipple discharge and an intraductal papilloma  Pre-operative Diagnosis: see above  Post-operative Diagnosis: Same  Surgeon: Stark Klein   Anesthesia: General endotracheal anesthesia  ASA Class: 3  Procedure Details  The patient was seen in the Holding Room. The risks, benefits, complications, treatment options, and expected outcomes were discussed with the patient. The possibilities of bleeding, infection, the need for additional procedures, failure to diagnose a condition, and creating a complication requiring transfusion or operation were discussed with the patient. The patient concurred with the proposed plan, giving informed consent.  The site of surgery properly noted/marked. The patient was taken to Operating Room # 6, identified, and the procedure verified as Left Breast Seed localized Lumpectomy, left axillary seed localized lymph node biopsy, left sentinel lymph node biopsy, right breast seed localized excisional biopsy. A Time Out was held and the above information confirmed.The MagTrace was injected into the subareolar location.   The left arm, bilateral breast, and chest were prepped and draped in standard fashion. The right side was addressed first.    The excisional biopsy was performed by creating a inferomedial circumareolar incision near the previously placed radioactive seed.  Dissection was carried down to around the point of maximum signal intensity. The cautery was used to perform the dissection.  Hemostasis was achieved with cautery. The edges of the cavity were marked with two clips.   The specimen was inked with the margin marker paint kit.     Specimen radiography confirmed inclusion of the mammographic lesion, the clip, and the seed.  The background signal in the breast was zero.  The wound was irrigated and hemostasis achieved.  The deeper tissues were reapproximated with interrupted 2-0 vicryl.  The skin was closed with 3-0 vicryl in layers and 4-0 monocryl subcuticular suture.    The left side was then addressed.  The mass and seed were far laterally and inferiorly located.  A lateral inframammary incision was made.  The cautery was used to perform the dissection around the mass and seed.  Once it was removed, it was marked with the margin marker paint kit.  A specimen mammogram confirmed the seed and the clip.  Six orange clips were placed in the surrounding tissues.  The wound was irrigated and closed with interrupted 3-0 vicryl deep dermal sutures and running 4-0 monocryl subcuticular suture.    Using a hand-held gamma probe, left axillary sentinel nodes were identified transcutaneously.  An oblique incision was created below the axillary hairline.  Dissection was carried through the clavipectoral fascia.  The seed containing node was very firm and enlarged.  It also contained MagTrace signal.  It was removed and clips placed on lymphovascular channels as needed.  One additional deep level two axillary sentinel node was removed.  Counts per second were >9999 and around 5000.    The background count was 0 cps.  The wound was irrigated.  Hemostasis was achieved with cautery.  The axillary incision was closed with a 3-0 vicryl deep dermal interrupted sutures and a 4-0 monocryl subcuticular closure.    Sterile dressings were applied. At the end of the operation, all sponge, instrument, and needle counts were correct.  Findings: grossly clear surgical margins. Left side  firm 1.5-2 cm dominant lymph node, right side- anterior margin is skin.  Left side, posterior margin is pectoralis.    Estimated Blood Loss:  min         Specimens: right  breast tissue with seed, left breast tissue with seed, left axillary lymph node with seed and one additional left axillary sentinel lymph node         Complications:  None; patient tolerated the procedure well.         Disposition: PACU - hemodynamically stable.         Condition: stable

## 2022-01-15 NOTE — Anesthesia Postprocedure Evaluation (Signed)
Anesthesia Post Note  Patient: Tracie Atkins  Procedure(s) Performed: LEFT BREAST SEED LOCALIZED LUMPECTOMY, LEFT SEED TARGETED AXILLARY LYMPH NODE BIOPSY, LEFT SENTINEL LYMPH NODE BIOPSY (Left: Breast) RIGHT BREAST SEED LOCALIZED EXCISIONAL BIOPSY (Right: Breast)     Patient location during evaluation: PACU Anesthesia Type: General Level of consciousness: awake and alert, patient cooperative and oriented Pain management: pain level controlled (pain improving) Vital Signs Assessment: post-procedure vital signs reviewed and stable Respiratory status: spontaneous breathing, nonlabored ventilation and respiratory function stable Cardiovascular status: blood pressure returned to baseline and stable Postop Assessment: no apparent nausea or vomiting and able to ambulate Anesthetic complications: no   No notable events documented.  Last Vitals:  Vitals:   01/15/22 1250 01/15/22 1305  BP: 112/77 114/70  Pulse: 88 91  Resp: 13 16  Temp:  36.6 C  SpO2: 96% 95%    Last Pain:  Vitals:   01/15/22 1305  TempSrc:   PainSc: 10-Worst pain ever                 Onia Shiflett,E. Giovanni Biby

## 2022-01-15 NOTE — Transfer of Care (Signed)
Immediate Anesthesia Transfer of Care Note  Patient: Tracie Atkins  Procedure(s) Performed: LEFT BREAST SEED LOCALIZED LUMPECTOMY, LEFT SEED TARGETED AXILLARY LYMPH NODE BIOPSY, LEFT SENTINEL LYMPH NODE BIOPSY (Left: Breast) RIGHT BREAST SEED LOCALIZED EXCISIONAL BIOPSY (Right: Breast)  Patient Location: PACU  Anesthesia Type:GA combined with regional for post-op pain  Level of Consciousness: awake and alert   Airway & Oxygen Therapy: Patient Spontanous Breathing  Post-op Assessment: Report given to RN and Post -op Vital signs reviewed and stable  Post vital signs: Reviewed and stable  Last Vitals:  Vitals Value Taken Time  BP 136/113 01/15/22 1205  Temp    Pulse 89 01/15/22 1208  Resp 21 01/15/22 1208  SpO2 96 % 01/15/22 1208  Vitals shown include unvalidated device data.  Last Pain:  Vitals:   01/15/22 0728  TempSrc:   PainSc: 0-No pain         Complications: No notable events documented.

## 2022-01-16 ENCOUNTER — Encounter (HOSPITAL_COMMUNITY): Payer: Self-pay | Admitting: General Surgery

## 2022-01-17 LAB — SURGICAL PATHOLOGY

## 2022-01-21 ENCOUNTER — Encounter (HOSPITAL_COMMUNITY): Payer: Self-pay

## 2022-01-22 ENCOUNTER — Other Ambulatory Visit: Payer: Self-pay

## 2022-01-22 ENCOUNTER — Encounter (HOSPITAL_COMMUNITY): Payer: Self-pay

## 2022-01-22 ENCOUNTER — Emergency Department (HOSPITAL_COMMUNITY)
Admission: EM | Admit: 2022-01-22 | Discharge: 2022-01-22 | Disposition: A | Discharge status: Home / Self Care | Payer: Medicare Other | Attending: Emergency Medicine | Admitting: Emergency Medicine

## 2022-01-22 DIAGNOSIS — F32A Depression, unspecified: Secondary | ICD-10-CM | POA: Diagnosis present

## 2022-01-22 DIAGNOSIS — R45851 Suicidal ideations: Secondary | ICD-10-CM | POA: Insufficient documentation

## 2022-01-22 DIAGNOSIS — Z20822 Contact with and (suspected) exposure to covid-19: Secondary | ICD-10-CM | POA: Diagnosis not present

## 2022-01-22 DIAGNOSIS — Z853 Personal history of malignant neoplasm of breast: Secondary | ICD-10-CM | POA: Insufficient documentation

## 2022-01-22 LAB — COMPREHENSIVE METABOLIC PANEL
ALT: 18 U/L (ref 0–44)
AST: 21 U/L (ref 15–41)
Albumin: 3.3 g/dL — ABNORMAL LOW (ref 3.5–5.0)
Alkaline Phosphatase: 154 U/L — ABNORMAL HIGH (ref 38–126)
Anion gap: 7 (ref 5–15)
BUN: 13 mg/dL (ref 8–23)
CO2: 24 mmol/L (ref 22–32)
Calcium: 8.9 mg/dL (ref 8.9–10.3)
Chloride: 109 mmol/L (ref 98–111)
Creatinine, Ser: 0.63 mg/dL (ref 0.44–1.00)
GFR, Estimated: >60 mL/min (ref >60)
Glucose, Bld: 119 mg/dL — ABNORMAL HIGH (ref 70–99)
Potassium: 3.8 mmol/L (ref 3.5–5.1)
Sodium: 140 mmol/L (ref 135–145)
Total Bilirubin: 0.2 mg/dL — ABNORMAL LOW (ref 0.3–1.2)
Total Protein: 6.6 g/dL (ref 6.5–8.1)

## 2022-01-22 LAB — CBC WITH DIFFERENTIAL/PLATELET
Abs Immature Granulocytes: 0.02 10*3/uL (ref 0.00–0.07)
Basophils Absolute: 0 10*3/uL (ref 0.0–0.1)
Basophils Relative: 1 %
Eosinophils Absolute: 0.2 10*3/uL (ref 0.0–0.5)
Eosinophils Relative: 2 %
HCT: 36.8 % (ref 36.0–46.0)
Hemoglobin: 11.4 g/dL — ABNORMAL LOW (ref 12.0–15.0)
Immature Granulocytes: 0 %
Lymphocytes Relative: 16 %
Lymphs Abs: 1.3 10*3/uL (ref 0.7–4.0)
MCH: 24.7 pg — ABNORMAL LOW (ref 26.0–34.0)
MCHC: 31 g/dL (ref 30.0–36.0)
MCV: 79.8 fL — ABNORMAL LOW (ref 80.0–100.0)
Monocytes Absolute: 0.8 10*3/uL (ref 0.1–1.0)
Monocytes Relative: 10 %
Neutro Abs: 5.6 10*3/uL (ref 1.7–7.7)
Neutrophils Relative %: 71 %
Platelets: 298 10*3/uL (ref 150–400)
RBC: 4.61 MIL/uL (ref 3.87–5.11)
RDW: 18.1 % — ABNORMAL HIGH (ref 11.5–15.5)
WBC: 7.9 10*3/uL (ref 4.0–10.5)
nRBC: 0 % (ref 0.0–0.2)

## 2022-01-22 LAB — RESP PANEL BY RT-PCR (FLU A&B, COVID) ARPGX2
Influenza A by PCR: NEGATIVE
Influenza B by PCR: NEGATIVE
SARS Coronavirus 2 by RT PCR: NEGATIVE

## 2022-01-22 LAB — ETHANOL: Alcohol, Ethyl (B): <10 mg/dL (ref <10)

## 2022-01-22 NOTE — ED Triage Notes (Addendum)
Pt states she was diagnosed with cancer in May. Since then she has been feeling depressed. Pt reports she took 4 tablets of her prescribed oxycodone yesterday in attempt to kill herself. Pt denies HI. AxOx4. Pt is tearful in triage but cooperative and calm.

## 2022-01-22 NOTE — ED Provider Notes (Signed)
Tracie Atkins DEPT Provider Note   CSN: [id number] Arrival date & time: 01/22/22  1204     History  Chief Complaint  Patient presents with   Suicidal    Tracie Atkins is a 66 y.o. female.  66 year old female with a past medical history of mental health presents to the ED with a chief complaint of suicidal ideations.  Patient states has been feeling very depressed after being diagnosed with breast cancer in May, recently had surgery.  Was placed on antidepressants also being placed on trazodone which is not working for her.  Patient states that she is originally from [place] and would like to return there.  She does have a prior history of mental health but has not been on any medication.  No prior suicidal attempts.  She did try to hurt herself by taking 4 hydrocodone's yesterday.  He is tearful but cooperative.  No chest pain, shortness of breath, fevers.  The history is provided by the patient and medical records.       Home Medications Prior to Admission medications   Medication Sig Start Date End Date Taking? Authorizing Provider  FLUoxetine (PROZAC) 20 MG capsule Take 1 capsule (20 mg total) by mouth daily. Patient not taking: Reported on 01/02/2022 08/24/21   Parks Ranger, DO  oxyCODONE (OXY IR/ROXICODONE) 5 MG immediate release tablet Take 1 tablet (5 mg total) by mouth every 6 (six) hours as needed for severe pain. 01/15/22   Stark Klein, MD  QUEtiapine (SEROQUEL) 200 MG tablet Take 200 mg by mouth at bedtime. 12/16/21   [provider]  risperiDONE (RISPERDAL) 0.5 MG tablet Take 0.5 mg by mouth 2 (two) times daily. 12/16/21   [provider]  topiramate (TOPAMAX) 50 MG tablet Take 1 tablet (50 mg total) by mouth 2 (two) times daily. 08/23/21 08/23/22  Parks Ranger, DO  traZODone (DESYREL) 100 MG tablet Take 100 mg by mouth at bedtime as needed for sleep. 12/16/21   [provider]  zolpidem (AMBIEN) 5 MG  tablet Take 5 mg by mouth at bedtime as needed for sleep. 11/11/21   [provider]      Allergies    Patient has no known allergies.    Review of Systems   Review of Systems  Constitutional:  Negative for chills and fever.  HENT:  Negative for sore throat.   Respiratory:  Negative for shortness of breath.   Cardiovascular:  Negative for chest pain.  Gastrointestinal:  Negative for abdominal pain and nausea.  Genitourinary:  Negative for flank pain.  Neurological:  Negative for light-headedness and headaches.  All other systems reviewed and are negative.   Physical Exam Updated Vital Signs BP 116/83 (BP Location: Left Arm)   Pulse 82   Temp 98.3 F (36.8 C) (Oral)   Resp 16   Ht '5\' 4"'$  (1.626 m)   Wt 90.7 kg   SpO2 95%   BMI 34.33 kg/m  Physical Exam Vitals and nursing note reviewed.  Constitutional:      Appearance: Normal appearance.  HENT:     Head: Normocephalic and atraumatic.     Nose: Nose normal.     Mouth/Throat:     Mouth: Mucous membranes are moist.  Eyes:     Pupils: Pupils are equal, round, and reactive to light.  Cardiovascular:     Rate and Rhythm: Normal rate.  Pulmonary:     Effort: Pulmonary effort is normal.  Abdominal:  General: Abdomen is flat.  Musculoskeletal:     Cervical back: Normal range of motion and neck supple.  Skin:    General: Skin is warm and dry.  Neurological:     Mental Status: She is alert and oriented to person, place, and time.  Psychiatric:        Mood and Affect: Mood normal. Affect is labile and tearful.        Speech: Speech normal.        Behavior: Behavior is cooperative.     ED Results / Procedures / Treatments   Labs (all labs ordered are listed, but only abnormal results are displayed) Labs Reviewed  COMPREHENSIVE METABOLIC PANEL - Abnormal; Notable for the following components:      Result Value   Glucose, Bld 119 (*)    Albumin 3.3 (*)    Alkaline Phosphatase 154 (*)    Total Bilirubin  0.2 (*)    All other components within normal limits  CBC WITH DIFFERENTIAL/PLATELET - Abnormal; Notable for the following components:   Hemoglobin 11.4 (*)    MCV 79.8 (*)    MCH 24.7 (*)    RDW 18.1 (*)    All other components within normal limits  RESP PANEL BY RT-PCR (FLU A&B, COVID) ARPGX2  ETHANOL  RAPID URINE DRUG SCREEN, HOSP PERFORMED    EKG None  Radiology No results found.  Procedures Procedures    Medications Ordered in ED Medications - No data to display  ED Course/ Medical Decision Making/ A&P                           Medical Decision Making Amount and/or Complexity of Data Reviewed Labs: ordered.    She here with a chief complaint of depression.  Reports after her diagnoses of breast cancer and she has been more depressed than usual.  Patient did have surgery in order to have this taking care of.  She does report worsening depression.  She is currently from [place], states that she wants to get back there.  Her suicidal attempt was taking 4 Norco's yesterday.  She is without any medical complaint such as headache, chest pain, shortness of breath.  No prior suicidal attempts.  Interpretation of labs by me with a CMP with no electrolyte derangement, creatinine levels within normal limits.  LFTs unremarkable.  CBC is within normal limits.  COVID-19 is negative.  After being medically clear patient was waiting for TTS consultation, however patient reports she would like to get back to [place] sooner than later therefore is requesting discharge.  She reports she would feel better if she got to [place] via [employer] bus.  She is overall in stable condition, I do not feel the patient is a threat to herself at this time.  I did provide her with outpatient resources for further management.  Patient stable for discharge.    Portions of this note were generated with L[employer]. Dictation errors may occur despite best attempts at proofreading.    Final Clinical Impression(s) / ED Diagnoses Final diagnoses:  Suicidal ideation  Depression, unspecified depression type    Rx / DC Orders ED Discharge Orders     None         Janeece Fitting, Hershal Coria 01/22/22 1604    Carmin Muskrat, MD 01/22/22 1614

## 2022-01-22 NOTE — ED Provider Triage Note (Signed)
Emergency Medicine Provider Triage Evaluation Note  Tracie Atkins , a 66 y.o. female  was evaluated in triage.  Pt complains of SI.  Patient was diagnosed with breast cancer, currently had surgery for a lumpectomy.  Reports "I have a lot on my mind ", she was evaluated at [facility], had a prescription for trazodone which is not working.  Prior history of mental health however has not been taking medications..  Review of Systems  Positive: Depression, SI Negative: Chest pain, shortness of breath  Physical Exam  BP 133/80   Pulse 93   Temp 98.3 F (36.8 C) (Oral)   Resp 18   Ht '5\' 4"'$  (1.626 m)   Wt 90.7 kg   SpO2 100%   BMI 34.33 kg/m  Gen:   Awake, no distress   Resp:  Normal effort  MSK:   Moves extremities without difficulty  Other:  Careful in triage.  Medical Decision Making  Medically screening exam initiated at 12:32 PM.  Appropriate orders placed.  Myangel Summons was informed that the remainder of the evaluation will be completed by another provider, this initial triage assessment does not replace that evaluation, and the importance of remaining in the ED until their evaluation is complete.     Janeece Fitting, PA-C 01/22/22 1235

## 2022-01-22 NOTE — ED Notes (Signed)
Pt stated she is no longer suicidal, denied SI/HI. Denied AVH. She stated she just "wants someone to talk to". Pt given a cup of water per request. She had no complaints at this time was cooperative and non combative.

## 2022-01-22 NOTE — Discharge Instructions (Addendum)
Your laboratories also are within normal limits.  Attached are resources for help with your mental health.  You experience any worsening symptoms you may return to the emergency department.

## 2022-01-24 ENCOUNTER — Encounter: Payer: Self-pay | Admitting: *Deleted

## 2022-02-10 ENCOUNTER — Telehealth: Payer: Self-pay | Admitting: *Deleted

## 2022-02-10 ENCOUNTER — Encounter: Payer: Self-pay | Admitting: *Deleted

## 2022-02-10 NOTE — Telephone Encounter (Signed)
Received order for oncotype testing. Requisition faxed to pathology and [facility]

## 2022-02-19 ENCOUNTER — Other Ambulatory Visit: Payer: Self-pay | Admitting: *Deleted

## 2022-02-19 ENCOUNTER — Encounter: Payer: Self-pay | Admitting: *Deleted

## 2022-02-19 ENCOUNTER — Telehealth: Payer: Self-pay | Admitting: *Deleted

## 2022-02-19 DIAGNOSIS — C50412 Malignant neoplasm of upper-outer quadrant of left female breast: Secondary | ICD-10-CM

## 2022-02-19 NOTE — Telephone Encounter (Signed)
Received oncotype results of 11/13% ordered by Dr. Barry Dienes. Results sent to [facility].

## 2022-02-20 ENCOUNTER — Telehealth: Payer: Self-pay | Admitting: Radiation Oncology

## 2022-02-20 NOTE — Telephone Encounter (Signed)
Called patient to scheduled a consultation w. Dr. Isidore Moos. Patient stated she would give [place] a call back this afternoon.

## 2022-02-24 ENCOUNTER — Telehealth: Payer: Self-pay | Admitting: Radiation Oncology

## 2022-02-24 ENCOUNTER — Encounter: Payer: Self-pay | Admitting: *Deleted

## 2022-02-24 NOTE — Telephone Encounter (Signed)
Called patient to schedule a consultation w. Dr. Isidore Moos. Patient stated she would give [place] a call this Thursday 8/24 once she returns to [facility].

## 2022-02-26 ENCOUNTER — Encounter (HOSPITAL_COMMUNITY): Payer: Self-pay

## 2022-02-27 ENCOUNTER — Telehealth: Payer: Self-pay | Admitting: Hematology and Oncology

## 2022-02-27 NOTE — Telephone Encounter (Signed)
Pt called in to r/s her missed new pt appt. R/s appt. Pt is aware of new appt date/time.

## 2022-03-06 NOTE — Progress Notes (Signed)
Radiation Oncology         [phone] ________________________________  Name: Tracie Atkins        MRN: [id number]  Date of Service: 03/11/2022 DOB: 1956-04-10  WE:[id number], No Pcp Per  Stark Klein, MD     REFERRING PHYSICIAN: Stark Klein, MD   DIAGNOSIS: The encounter diagnosis was Malignant neoplasm of upper-outer quadrant of left breast in female, estrogen receptor positive ([place]).   HISTORY OF PRESENT ILLNESS: Tracie Atkins is a 66 y.o. female seen at the request of Dr. Barry Dienes for a diagnosis of left breast cancer. The patient noted a palpable lump in the left breast with pain in the right sided nipple discharge that was spontaneous and was seen for diagnostic mammogram in March 2023.  At that time a solitary tubular appearing structure in the subareolar breast was noted on the right side, and in the left breast a spiculated mass within the left outer breast measuring 2.6 cm by mammography and a probable enlarged left axillary lymph node was identified.  Targeted ultrasound showed a dilated duct in the right breast without evidence of intraductal mass or vascularity.  The left breast showed the mass in the 10:00 axis and by ultrasound measured 2.2 cm corresponding to the mammographic finding.  There were 2 abnormal appearing lymph nodes in the left axilla.  She underwent an MRI of the brain on 11/04/2021 showing a possible 4 mm lesion in the left lateral aspect of the pituitary gland.  She return for biopsy on 11/05/2021.  Her left breast showed grade 2 invasive ductal carcinoma that was ER/PR positive HER2 negative with a Ki-67 of 10%.  The specimen from the left axilla showed invasive ductal carcinoma though no nodal tissue was appreciated in the specimen.  She went on to proceed with bilateral lumpectomies and left sentinel lymph node biopsy on 01/15/2022.  The right breast specimen showed fibrous scar with hemosiderin and no evidence of carcinoma.  The left breast however showed invasive ductal  carcinoma measuring 3.1 cm in greatest dimension, grade 2 and margins were clear.  2 lymph nodes were sampled.  1 was negative for metastatic disease the other was consistent with metastatic carcinoma with extracapsular extension.  Her specimen was sent for Oncotype DX score and this resulted as a score of 11.  She is seen to discuss adjuvant therapy with radiation and has an appointment to meet with Dr. Chryl Heck tomorrow.   PREVIOUS RADIATION THERAPY: No   PAST MEDICAL HISTORY:  Past Medical History:  Diagnosis Date   Breast cancer ([place]) 2023   left   Chronic dental infection 8/189   multiple teeth removed and Post -op infection.   Depression    ETOH abuse    Fatty liver    Headache        PAST SURGICAL HISTORY: Past Surgical History:  Procedure Laterality Date   BREAST LUMPECTOMY WITH RADIOACTIVE SEED AND SENTINEL LYMPH NODE BIOPSY Left 01/15/2022   Procedure: LEFT BREAST SEED LOCALIZED LUMPECTOMY, LEFT SEED TARGETED AXILLARY LYMPH NODE BIOPSY, LEFT SENTINEL LYMPH NODE BIOPSY;  Surgeon: Stark Klein, MD;  Location: [facility];  Service: General;  Laterality: Left;   DENTAL SURGERY     RADIOACTIVE SEED GUIDED EXCISIONAL BREAST BIOPSY Right 01/15/2022   Procedure: RIGHT BREAST SEED LOCALIZED EXCISIONAL BIOPSY;  Surgeon: Stark Klein, MD;  Location: [facility];  Service: General;  Laterality: Right;   TUBAL LIGATION       FAMILY HISTORY: No family history on file.   SOCIAL  HISTORY:  reports that she has never smoked. She has never been exposed to tobacco smoke. She has never used smokeless tobacco. She reports current alcohol use of about 1.0 standard drink of alcohol per week. She reports that she does not use drugs. The patient is single and lives in [place]. She's originally from [place]. She's accompanied by her son.    ALLERGIES: Patient has no known allergies.   MEDICATIONS:  Current Outpatient Medications  Medication Sig Dispense Refill   FLUoxetine (PROZAC) 20 MG  capsule Take 1 capsule (20 mg total) by mouth daily. (Patient not taking: Reported on 01/02/2022) 39 capsule 2   oxyCODONE (OXY IR/ROXICODONE) 5 MG immediate release tablet Take 1 tablet (5 mg total) by mouth every 6 (six) hours as needed for severe pain. 15 tablet 0   QUEtiapine (SEROQUEL) 200 MG tablet Take 200 mg by mouth at bedtime.     risperiDONE (RISPERDAL) 0.5 MG tablet Take 0.5 mg by mouth 2 (two) times daily.     topiramate (TOPAMAX) 50 MG tablet Take 1 tablet (50 mg total) by mouth 2 (two) times daily. 60 tablet 2   traZODone (DESYREL) 100 MG tablet Take 100 mg by mouth at bedtime as needed for sleep.     zolpidem (AMBIEN) 5 MG tablet Take 5 mg by mouth at bedtime as needed for sleep.     No current facility-administered medications for this visit.     REVIEW OF SYSTEMS: On review of systems, the patient reports that she is doing pretty well since surgery. She denies any breast concerns at this time with healing or with her right breast. No other complaints are verbalized.      PHYSICAL EXAM:  Wt Readings from Last 3 Encounters:  01/22/22 200 lb (90.7 kg)  01/15/22 200 lb (90.7 kg)  01/08/22 202 lb 3.2 oz (91.7 kg)   Temp Readings from Last 3 Encounters:  01/22/22 98.3 F (36.8 C) (Oral)  01/15/22 97.8 F (36.6 C)  01/08/22 97.7 F (36.5 C)   BP Readings from Last 3 Encounters:  01/22/22 116/83  01/15/22 114/70  01/08/22 (!) 158/91   Pulse Readings from Last 3 Encounters:  01/22/22 82  01/15/22 91  01/08/22 93    In general this is a well appearing caucasian female in no acute distress. She's alert and oriented x4 and appropriate throughout the examination. Cardiopulmonary assessment is negative for acute distress and she exhibits normal effort. Bilateral breast exam is deferred.    ECOG = 0  0 - Asymptomatic (Fully active, able to carry on all predisease activities without restriction)  1 - Symptomatic but completely ambulatory (Restricted in physically  strenuous activity but ambulatory and able to carry out work of a light or sedentary nature. For example, light housework, office work)  2 - Symptomatic, <50% in bed during the day (Ambulatory and capable of all self care but unable to carry out any work activities. Up and about more than 50% of waking hours)  3 - Symptomatic, >50% in bed, but not bedbound (Capable of only limited self-care, confined to bed or chair 50% or more of waking hours)  4 - Bedbound (Completely disabled. Cannot carry on any self-care. Totally confined to bed or chair)  5 - Death   Eustace Pen MM, Creech RH, Tormey DC, et al. [phone]). "Toxicity and response criteria of the [facility]". [place] Oncol. 5 (6): 649-55    LABORATORY DATA:  Lab Results  Component Value  Date   WBC 7.9 01/22/2022   HGB 11.4 (L) 01/22/2022   HCT 36.8 01/22/2022   MCV 79.8 (L) 01/22/2022   PLT 298 01/22/2022   Lab Results  Component Value Date   NA 140 01/22/2022   K 3.8 01/22/2022   CL 109 01/22/2022   CO2 24 01/22/2022   Lab Results  Component Value Date   ALT 18 01/22/2022   AST 21 01/22/2022   ALKPHOS 154 (H) 01/22/2022   BILITOT 0.2 (L) 01/22/2022      RADIOGRAPHY: No results found.     IMPRESSION/PLAN: 1. 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 Dr. Lisbeth Renshaw discusses the pathology findings and reviews the nature of left node positive breast disease. She has done well since surgery. She meets with Dr. Chryl Heck tomorrow, but based on her Oncotype Dx score, we don't believe she will be offered chemotherapy. The patient states she would not be willing to receive this even if it was recommended. Dr. Lisbeth Renshaw discusses the rationale for external radiotherapy to the breast  to reduce risks of local recurrence followed by antiestrogen therapy. We discussed the risks, benefits, short, and long term effects of radiotherapy, as well as the curative intent, and the patient is  interested in proceeding. Dr. Lisbeth Renshaw discusses the delivery and logistics of radiotherapy and anticipates a course of 6 1/2 weeks of radiotherapy to the left breast and regional nodes. Written consent is obtained and placed in the chart, a copy was provided to the patient. The patient will be contacted to coordinate treatment planning by our simulation department.  2. 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 We will refer her for neurosurgical assessment to determine next steps.   In a visit lasting 60 minutes, greater than 50% of the time was spent face to face reviewing her case, as well as in preparation of, discussing, and coordinating the patient's care.  The above documentation reflects my direct findings during this shared patient visit. Please see the separate note by Dr. Lisbeth Renshaw on this date for the remainder of the patient's plan of care.    Carola Rhine, [facility]    **Disclaimer: This note was dictated with voice recognition software. Similar sounding words can inadvertently be transcribed and this note may contain transcription errors which may not have been corrected upon publication of note.**

## 2022-03-11 ENCOUNTER — Ambulatory Visit
Admission: RE | Admit: 2022-03-11 | Discharge: 2022-03-11 | Disposition: A | Discharge status: Home / Self Care | Payer: Medicare Other | Source: Ambulatory Visit | Attending: Radiation Oncology | Admitting: Radiation Oncology

## 2022-03-11 ENCOUNTER — Encounter: Payer: Self-pay | Admitting: Radiation Oncology

## 2022-03-11 ENCOUNTER — Other Ambulatory Visit: Payer: Self-pay

## 2022-03-11 VITALS — BP 148/79 | HR 80 | Temp 98.4°F | Resp 17 | Ht 62.0 in | Wt 200.2 lb

## 2022-03-11 VITALS — BP 148/79 | HR 80 | Temp 98.4°F | Resp 17 | Ht 62.0 in | Wt 200.4 lb

## 2022-03-11 DIAGNOSIS — K760 Fatty (change of) liver, not elsewhere classified: Secondary | ICD-10-CM | POA: Insufficient documentation

## 2022-03-11 DIAGNOSIS — F1010 Alcohol abuse, uncomplicated: Secondary | ICD-10-CM | POA: Diagnosis not present

## 2022-03-11 DIAGNOSIS — Z79899 Other long term (current) drug therapy: Secondary | ICD-10-CM | POA: Insufficient documentation

## 2022-03-11 DIAGNOSIS — Z170 Estrogen receptor positive status [ER+]: Secondary | ICD-10-CM

## 2022-03-11 DIAGNOSIS — E237 Disorder of pituitary gland, unspecified: Secondary | ICD-10-CM | POA: Diagnosis not present

## 2022-03-11 DIAGNOSIS — C779 Secondary and unspecified malignant neoplasm of lymph node, unspecified: Secondary | ICD-10-CM | POA: Diagnosis not present

## 2022-03-11 DIAGNOSIS — C50412 Malignant neoplasm of upper-outer quadrant of left female breast: Secondary | ICD-10-CM | POA: Diagnosis not present

## 2022-03-11 NOTE — Progress Notes (Signed)
New Breast Cancer Diagnosis: Left Breast Outer Quadrant  Did patient present with symptoms (if so, please note symptoms) or screening mammography?:Palpable mass in the left breast with pain, and right sided nipple discharge.    MRI Brain 11/04/2021: Possible 4 mm lesion in the left lateral aspect of the pituitary gland.      Location and Extent of disease :left breast. Located at 10 o'clock position, measured 2.2 cm in greatest dimension. Adenopathy yes 2 abnormal appearing lymph nodes in the left axilla.  Histology per Pathology Report: grade 2, Invasive Ductal Carcinoma  Receptor Status: ER(positive), PR (positive), Her2-neu (negative), Ki-(10%)   Surgeon and surgical plan, if any:  Dr. Barry Dienes -Bilateral Lumpectomies, left SLN biopsy 01/15/2022   Medical oncologist, treatment if any:   Dr. Chryl Heck 03/12/2022 -Oncotype Dx: 70   Family History of Breast/Ovarian/Prostate Cancer: None   Lymphedema issues, if any: No     Pain issues, if any: No    SAFETY ISSUES: Prior radiation? No Pacemaker/ICD? No Possible current pregnancy? Postmenopausal Is the patient on methotrexate? No  Current Complaints / other details:

## 2022-03-12 ENCOUNTER — Other Ambulatory Visit: Payer: Self-pay | Admitting: *Deleted

## 2022-03-12 ENCOUNTER — Inpatient Hospital Stay: Payer: Medicare Other | Attending: Hematology and Oncology | Admitting: Hematology and Oncology

## 2022-03-12 ENCOUNTER — Inpatient Hospital Stay: Payer: Medicare Other

## 2022-03-12 DIAGNOSIS — C50412 Malignant neoplasm of upper-outer quadrant of left female breast: Secondary | ICD-10-CM

## 2022-03-12 DIAGNOSIS — F419 Anxiety disorder, unspecified: Secondary | ICD-10-CM | POA: Diagnosis not present

## 2022-03-12 DIAGNOSIS — Z170 Estrogen receptor positive status [ER+]: Secondary | ICD-10-CM | POA: Insufficient documentation

## 2022-03-12 DIAGNOSIS — C773 Secondary and unspecified malignant neoplasm of axilla and upper limb lymph nodes: Secondary | ICD-10-CM | POA: Insufficient documentation

## 2022-03-12 DIAGNOSIS — F32A Depression, unspecified: Secondary | ICD-10-CM | POA: Diagnosis not present

## 2022-03-12 DIAGNOSIS — R45851 Suicidal ideations: Secondary | ICD-10-CM

## 2022-03-12 DIAGNOSIS — F1014 Alcohol abuse with alcohol-induced mood disorder: Secondary | ICD-10-CM

## 2022-03-12 DIAGNOSIS — F332 Major depressive disorder, recurrent severe without psychotic features: Secondary | ICD-10-CM

## 2022-03-12 NOTE — Progress Notes (Signed)
Time CONSULT NOTE  Patient Care Team: Patient, No Pcp Per as PCP - General (General Practice) Mauro Kaufmann, RN as Oncology Nurse Navigator Rockwell Germany, RN as Oncology Nurse Navigator  CHIEF COMPLAINTS/PURPOSE OF CONSULTATION:  Newly diagnosed breast cancer  HISTORY OF PRESENTING ILLNESS:  Tracie Atkins 66 y.o. female is here because of recent diagnosis of left breast IDC  I reviewed her records extensively and collaborated the history with the patient.  SUMMARY OF ONCOLOGIC HISTORY: Oncology History  Malignant neoplasm of upper-outer quadrant of left breast in female, estrogen receptor positive ([place])  11/05/2021 Initial Diagnosis   Palpable lump in the left breast with the pain along with right-sided nipple discharge.  Mammogram reveals highly suspicious mass left breast 3 o'clock position 2.2 cm, 2 abnormal left axillary lymph nodes: Biopsy: Grade 2 IDC ER 100%, PR 0%, Ki-67 10%, HER2 2+ by IHC, FISH negative ratio 1.4, axillary lymph node biopsy: IDC, ER 100%, PR 20%, Ki-67 10%, HER2 negative ratio 1.31   11/28/2021 Breast MRI   Left breast cancer 2.7 cm and 0.7 cm linear enhancement UOQ left breast needs biopsy, 0.5 cm mass posterior to the right nipple needs biopsy, 2-lymph nodes left axilla, 1.5 cm sternal lesion   12/05/2021 Cancer Staging   Staging form: Breast, AJCC 8th Edition - Clinical: Stage IIA (cT2, cN1, cM0, G2, ER+, PR+, HER2-) - Signed by Nicholas Lose, MD on 12/05/2021 Stage prefix: Initial diagnosis Histologic grading system: 3 grade system   01/15/2022 Pathology Results   SURGICAL PATHOLOGY  CASE: [id number]  PATIENT: Tracie Atkins  Surgical Pathology Report      Clinical History: left breast cancer (cm)   FINAL MICROSCOPIC DIAGNOSIS:   A. BREAST, LEFT, LUMPECTOMY:  Invasive ductal carcinoma with clear margins of resection.  Please see the synoptic report after specimen D.   B. BREAST, RIGHT, LUMPECTOMY:  Fibrous scar with  hemosiderin deposits.  Carcinoma is not identified in the resected specimen.   C. LYMPH NODE, LEFT AXILLARY #2, SENTINEL, EXCISION:  A lymph node negative for metastatic carcinoma.   D. 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   pTNM classification ( AJCC 8th Edition): pT2, pN1a  Results of prognostic markers performed on prior biopsy from 11/05/2021  ([id number])           ER: Positive in 100% of tumor cells.           PR: Negative.           Ki-67: Positive in 10% of tumor cells.           HER-2/neu: 2+ by IHC/ Negative by FISH.    02/17/2022 Oncotype testing   Oncotype of 11, no benefit from chemotherapy.    Interval History  She arrived to the appointment today by herself. She mentioned that she used to live in [place], moved to [facility] to be close to her sons. At baseline, she has history of depression and anxiety, she mentions history of suicidal ideation but denies any active ideation. She after leaving from the appointment, said she cant remember much so brought her son back to the clinic for discussion  MEDICAL HISTORY:  Past Medical History:  Diagnosis Date   Breast cancer ([place]) 2023   left   Chronic dental infection 8/189   multiple teeth removed and Post -op infection.   Depression    ETOH abuse    Fatty liver  Headache     SURGICAL HISTORY: Past Surgical History:  Procedure Laterality Date   BREAST LUMPECTOMY WITH RADIOACTIVE SEED AND SENTINEL LYMPH NODE BIOPSY Left 01/15/2022   Procedure: LEFT BREAST SEED LOCALIZED LUMPECTOMY, LEFT SEED TARGETED AXILLARY LYMPH NODE BIOPSY, LEFT SENTINEL LYMPH NODE BIOPSY;  Surgeon: Stark Klein, MD;  Location: [facility];  Service: General;  Laterality: Left;   DENTAL SURGERY     RADIOACTIVE SEED GUIDED EXCISIONAL BREAST BIOPSY Right 01/15/2022   Procedure: RIGHT BREAST SEED LOCALIZED EXCISIONAL BIOPSY;  Surgeon: Stark Klein, MD;  Location: [facility];  Service:  General;  Laterality: Right;   TUBAL LIGATION      SOCIAL HISTORY: Social History   Socioeconomic History   Marital status: Single    Spouse name: Not on file   Number of children: 3   Years of education: Not on file   Highest education level: Not on file  Occupational History   Not on file  Tobacco Use   Smoking status: Never    Passive exposure: Never   Smokeless tobacco: Never  Vaping Use   Vaping Use: Never used  Substance and Sexual Activity   Alcohol use: Yes    Alcohol/week: 1.0 standard drink of alcohol    Types: 1 Standard drinks or equivalent per week    Comment: 1 mixed (liquor) drink per week   Drug use: No   Sexual activity: Never  Other Topics Concern   Not on file  Social History Narrative   Not on file   Social Determinants of Health   Financial Resource Strain: Not on file  Food Insecurity: Not on file  Transportation Needs: Unmet Transportation Needs (03/13/2022)   PRAPARE - Hydrologist (Medical): Yes    Lack of Transportation (Non-Medical): No  Physical Activity: Not on file  Stress: Not on file  Social Connections: Not on file  Intimate Partner Violence: Not on file    FAMILY HISTORY: No family history on file.  ALLERGIES:  has No Known Allergies.  MEDICATIONS:  Current Outpatient Medications  Medication Sig Dispense Refill   FLUoxetine (PROZAC) 20 MG capsule Take 1 capsule (20 mg total) by mouth daily. 39 capsule 2   oxyCODONE (OXY IR/ROXICODONE) 5 MG immediate release tablet Take 1 tablet (5 mg total) by mouth every 6 (six) hours as needed for severe pain. 15 tablet 0   QUEtiapine (SEROQUEL) 200 MG tablet Take 200 mg by mouth at bedtime.     risperiDONE (RISPERDAL) 0.5 MG tablet Take 0.5 mg by mouth 2 (two) times daily.     topiramate (TOPAMAX) 50 MG tablet Take 1 tablet (50 mg total) by mouth 2 (two) times daily. 60 tablet 2   traZODone (DESYREL) 100 MG tablet Take 100 mg by mouth at bedtime as needed for  sleep.     zolpidem (AMBIEN) 5 MG tablet Take 5 mg by mouth at bedtime as needed for sleep. (Patient not taking: Reported on 03/11/2022)     No current facility-administered medications for this visit.    REVIEW OF SYSTEMS:   Constitutional: Denies fevers, chills or abnormal night sweats Eyes: Denies blurriness of vision, double vision or watery eyes Ears, nose, mouth, throat, and face: Denies mucositis or sore throat Respiratory: Denies cough, dyspnea or wheezes Cardiovascular: Denies palpitation, chest discomfort or lower extremity swelling Gastrointestinal:  Denies nausea, heartburn or change in bowel habits Skin: Denies abnormal skin rashes Lymphatics: Denies new lymphadenopathy or easy bruising Neurological:Denies numbness, tingling or new  weaknesses Behavioral/Psych: Mood is stable, no new changes  Breast:  Denies any palpable lumps or discharge All other systems were reviewed with the patient and are negative.  PHYSICAL EXAMINATION: ECOG PERFORMANCE STATUS: 0 - Asymptomatic  Vitals:   03/12/22 1007  BP: (!) 159/88  Pulse: 73  Resp: 16  Temp: 97.7 F (36.5 C)  SpO2: 98%   Filed Weights   03/12/22 1007  Weight: 203 lb 8 oz (92.3 kg)    PE deferred in lieu of counseling   LABORATORY DATA:  I have reviewed the data as listed Lab Results  Component Value Date   WBC 7.9 01/22/2022   HGB 11.4 (L) 01/22/2022   HCT 36.8 01/22/2022   MCV 79.8 (L) 01/22/2022   PLT 298 01/22/2022   Lab Results  Component Value Date   NA 140 01/22/2022   K 3.8 01/22/2022   CL 109 01/22/2022   CO2 24 01/22/2022    RADIOGRAPHIC STUDIES: I have personally reviewed the radiological reports and agreed with the findings in the report.  ASSESSMENT AND PLAN:  Malignant neoplasm of upper-outer quadrant of left breast in female, estrogen receptor positive ([place]) This is a pleasant 66 yr old female patient with newly diagnosed left breast IDC, T2N1/Stage IIA, ER positive, PR negative, Her 2  negative, Ki 67 of 10%, oncotype of 11 referred to medical oncology for adjuvant recommendations.  Given oncotype results, we focused our discussion on antiestrogent therapy. We discussed following details about anti estrogen therapy.  We have discussed options for antiestrogen therapy today  With regards to Tamoxifen, we discussed that this is a SERM, selective estrogen receptor modulator. We discussed mechanism of action of Tamoxifen, adverse effects on Tamoxifen including but not limited to post menopausal symptoms, increased risk of DVT/PE, increased risk of endometrial cancer, questionable cataracts with long term use and increased risk of cardiovascular events in the study which was not statistically significant. A benefit from Tamoxifen would be improvement in bone density. With regards to aromatase inhibitors, we discussed mechanism of action, adverse effects including but not limited to post menopausal symptoms, arthralgias, myalgias, increased risk of cardiovascular events and bone loss.   Given underlying anxiety and depression, she will benefit from psychology/pscyhiatry intervention. We discussed about considering aromatase inhibitors after radiation. I conveyed this information to son as well.  RTC after radiation     All questions were answered. The patient knows to call the clinic with any problems, questions or concerns.    Benay Pike, MD 03/14/22

## 2022-03-13 ENCOUNTER — Inpatient Hospital Stay: Payer: Medicare Other | Admitting: Licensed Clinical Social Worker

## 2022-03-13 ENCOUNTER — Telehealth: Payer: Self-pay | Admitting: *Deleted

## 2022-03-13 ENCOUNTER — Encounter: Payer: Self-pay | Admitting: *Deleted

## 2022-03-13 NOTE — Telephone Encounter (Signed)
-----   Message from Christeen Douglas, [place] sent at 03/13/2022 11:23 AM EDT ----- Regarding: RE: need for counseling follow up Just FYI, I spoke with her and asked about the SI and connecting to services. She was very much "oh I'm over that right now" and not wanting any support currently. I tried to find in roads in different ways with offering to refer to have an appt set after she's done with treatment, offering to see her during her appt next week, etc. She declined everything but did take my number.   I have a feeling that is her pattern (falling into bad depression, going to the ED, feeling better, not seeking help until it is critical again)   ----- Message ----- From: Laureen Abrahams, RN Sent: 03/12/2022  10:46 AM EDT To: Christeen Douglas, LCSW Subject: need for counseling follow up                  Pt transferred care from [place] to here- first visit today for breast cancer needing further bx and start of antiestrogen therapy - pt has history of suicidal ideation with last episode approximately 4 weeks ago.  We are hoping to get her in with counseling ASAP- do you have any resources and or can support services follow up with her until she is under a counselor ?   Thank you Val

## 2022-03-13 NOTE — Progress Notes (Signed)
[facility] Work  Clinical Social Work was referred by nurse for assessment of psychosocial needs.  Clinical Social Worker contacted patient by phone  to offer support and assess for needs.    Patient reports that she is "doing okay". She is living with her son at his house and has support. Pt does have concerns with transportation since son works and she has upcoming radiation. She has tried contacting her Medicare about transportation and was told she does not have that benefit. She is also having trouble reaching DSS re: Medicaid transportation. CSW made referral to [facility]  CSW asked pt about coping and professional support due to history of depression and SI with multiple ED visits. Per pt, she is "over that" now. CSW offered support for pt through counseling at [facility] during treatment or connection in the community. Pt declined options at this time. She did take this CSW's contact information and said that she will call when ready to schedule.      [place], [place] Worker [employer]

## 2022-03-14 ENCOUNTER — Encounter: Payer: Self-pay | Admitting: Hematology and Oncology

## 2022-03-14 NOTE — Assessment & Plan Note (Signed)
This is a pleasant 67 yr old female patient with newly diagnosed left breast IDC, T2N1/Stage IIA, ER positive, PR negative, Her 2 negative, Ki 67 of 10%, oncotype of 11 referred to medical oncology for adjuvant recommendations.  Given oncotype results, we focused our discussion on antiestrogent therapy. We discussed following details about anti estrogen therapy.  We have discussed options for antiestrogen therapy today  With regards to Tamoxifen, we discussed that this is a SERM, selective estrogen receptor modulator. We discussed mechanism of action of Tamoxifen, adverse effects on Tamoxifen including but not limited to post menopausal symptoms, increased risk of DVT/PE, increased risk of endometrial cancer, questionable cataracts with long term use and increased risk of cardiovascular events in the study which was not statistically significant. A benefit from Tamoxifen would be improvement in bone density. With regards to aromatase inhibitors, we discussed mechanism of action, adverse effects including but not limited to post menopausal symptoms, arthralgias, myalgias, increased risk of cardiovascular events and bone loss.   Given underlying anxiety and depression, she will benefit from psychology/pscyhiatry intervention. We discussed about considering aromatase inhibitors after radiation. I conveyed this information to son as well.  RTC after radiation

## 2022-03-19 ENCOUNTER — Other Ambulatory Visit: Payer: Self-pay

## 2022-03-19 ENCOUNTER — Ambulatory Visit
Admission: RE | Admit: 2022-03-19 | Discharge: 2022-03-19 | Disposition: A | Discharge status: Home / Self Care | Payer: Medicare Other | Source: Ambulatory Visit | Attending: Radiation Oncology | Admitting: Radiation Oncology

## 2022-03-19 DIAGNOSIS — C50412 Malignant neoplasm of upper-outer quadrant of left female breast: Secondary | ICD-10-CM | POA: Insufficient documentation

## 2022-03-19 DIAGNOSIS — Z510 Encounter for antineoplastic radiation therapy: Secondary | ICD-10-CM | POA: Insufficient documentation

## 2022-03-20 ENCOUNTER — Telehealth: Payer: Self-pay | Admitting: General Practice

## 2022-03-20 NOTE — Telephone Encounter (Signed)
   Hermine Feria DOB: 06-Apr-1956 MRN: [id number]   RIDER WAIVER AND RELEASE OF LIABILITY  For purposes of improving physical access to our facilities, [facility] is pleased to partner with third parties to provide [facility] patients or other authorized individuals the option of convenient, on-demand ground transportation services (the [place]") through use of the technology service that enables users to request on-demand ground transportation from independent third-party providers.  By opting to use and accept these [employer], I, the undersigned, hereby agree on behalf of myself, and on behalf of any minor child using the Government social research officer for whom I am the parent or legal guardian, as follows:  Government social research officer provided to me are provided by independent third-party transportation providers who are not [employer] or employees and who are unaffiliated with [employer]. [facility] is neither a transportation carrier nor a common or public carrier. [facility] has no control over the quality or safety of the transportation that occurs as a result of the [employer]. [facility] cannot guarantee that any third-party transportation provider will complete any arranged transportation service. [facility] makes no representation, warranty, or guarantee regarding the reliability, timeliness, quality, safety, suitability, or availability of any of the Transport Services or that they will be error free. I fully understand that traveling by vehicle involves risks and dangers of serious bodily injury, including permanent disability, paralysis, and death. I agree, on behalf of myself and on behalf of any minor child using the Transport Services for whom I am the parent or legal guardian, that the entire risk arising out of my use of the [employer] remains solely with me, to the maximum extent permitted under applicable law. The [employer] are provided "as is"  and "as available." [facility] disclaims all representations and warranties, express, implied or statutory, not expressly set out in these terms, including the implied warranties of merchantability and fitness for a particular purpose. I hereby waive and release [facility], its agents, employees, officers, directors, representatives, insurers, attorneys, assigns, successors, subsidiaries, and affiliates from any and all past, present, or future claims, demands, liabilities, actions, causes of action, or suits of any kind directly or indirectly arising from acceptance and use of the [employer]. I further waive and release [facility] and its affiliates from all present and future liability and responsibility for any injury or death to persons or damages to property caused by or related to the use of the [employer]. I have read this Waiver and Release of Liability, and I understand the terms used in it and their legal significance. This Waiver is freely and voluntarily given with the understanding that my right (as well as the right of any minor child for whom I am the parent or legal guardian using the [employer]) to legal recourse against [facility] in connection with the [employer] is knowingly surrendered in return for use of these services.   I attest that I read the consent document to [name], gave Ms. Landry the opportunity to ask questions and answered the questions asked (if any). I affirm that Shimeka Bacot then provided consent for she's participation in this program.     Darrick Meigs [place]

## 2022-03-24 ENCOUNTER — Encounter: Payer: Self-pay | Admitting: *Deleted

## 2022-03-25 DIAGNOSIS — Z510 Encounter for antineoplastic radiation therapy: Secondary | ICD-10-CM | POA: Diagnosis not present

## 2022-03-26 ENCOUNTER — Ambulatory Visit
Admission: RE | Admit: 2022-03-26 | Discharge: 2022-03-26 | Disposition: A | Discharge status: Home / Self Care | Payer: Medicare Other | Source: Ambulatory Visit | Attending: Radiation Oncology | Admitting: Radiation Oncology

## 2022-03-26 ENCOUNTER — Other Ambulatory Visit: Payer: Self-pay

## 2022-03-26 DIAGNOSIS — Z510 Encounter for antineoplastic radiation therapy: Secondary | ICD-10-CM | POA: Diagnosis not present

## 2022-03-26 LAB — RAD ONC ARIA SESSION SUMMARY

## 2022-03-27 ENCOUNTER — Ambulatory Visit: Payer: Medicare Other | Admitting: Radiation Oncology

## 2022-03-27 ENCOUNTER — Ambulatory Visit: Payer: Medicare Other

## 2022-03-27 NOTE — Progress Notes (Signed)
Pt here for patient teaching.  Pt given Radiation and You booklet, skin care instructions, alra deodorant and Radiaplex.    Reviewed areas of pertinence such as fatigue, hair loss, skin changes, breast tenderness, and breast swelling. Pt able to give teach back of to pat skin and use unscented/gentle soap,apply Radiaplex bid, avoid applying anything to skin within 4 hours of treatment, avoid wearing an under wire bra, and to use an electric razor if they must shave. Pt verbalizes understanding of information given and will contact nursing with any questions or concerns.  Her son was present for education.  Gloriajean Dell. Leonie Green, BSN

## 2022-03-28 ENCOUNTER — Ambulatory Visit
Admission: RE | Admit: 2022-03-28 | Discharge: 2022-03-28 | Disposition: A | Discharge status: Home / Self Care | Payer: Medicare Other | Source: Ambulatory Visit | Attending: Radiation Oncology | Admitting: Radiation Oncology

## 2022-03-28 ENCOUNTER — Other Ambulatory Visit: Payer: Self-pay

## 2022-03-28 DIAGNOSIS — Z170 Estrogen receptor positive status [ER+]: Secondary | ICD-10-CM

## 2022-03-28 DIAGNOSIS — Z510 Encounter for antineoplastic radiation therapy: Secondary | ICD-10-CM | POA: Diagnosis not present

## 2022-03-28 LAB — RAD ONC ARIA SESSION SUMMARY
Course Elapsed Days: 2
Plan Fractions Treated to Date: 2
Plan Fractions Treated to Date: 2
Plan Prescribed Dose Per Fraction: 1.8 Gy
Plan Prescribed Dose Per Fraction: 1.8 Gy
Plan Total Fractions Prescribed: 28
Plan Total Fractions Prescribed: 28
Plan Total Prescribed Dose: 50.4 Gy
Plan Total Prescribed Dose: 50.4 Gy
Reference Point Dosage Given to Date: 3.6 Gy
Reference Point Dosage Given to Date: 3.6 Gy
Reference Point Session Dosage Given: 1.8 Gy
Reference Point Session Dosage Given: 1.8 Gy
Session Number: 2

## 2022-03-28 MED ORDER — ALRA NON-METALLIC DEODORANT (RAD-ONC)
1.0000 | Freq: Once | TOPICAL | Status: AC
  Administered 2022-03-28: 1 via TOPICAL

## 2022-03-28 MED ORDER — RADIAPLEXRX EX GEL: Freq: Once | CUTANEOUS | Status: AC

## 2022-03-31 ENCOUNTER — Ambulatory Visit
Admission: RE | Admit: 2022-03-31 | Discharge: 2022-03-31 | Disposition: A | Discharge status: Home / Self Care | Payer: Medicare Other | Source: Ambulatory Visit | Attending: Radiation Oncology | Admitting: Radiation Oncology

## 2022-03-31 ENCOUNTER — Other Ambulatory Visit: Payer: Self-pay

## 2022-03-31 DIAGNOSIS — Z510 Encounter for antineoplastic radiation therapy: Secondary | ICD-10-CM | POA: Diagnosis not present

## 2022-03-31 LAB — RAD ONC ARIA SESSION SUMMARY
Course Elapsed Days: 5
Plan Fractions Treated to Date: 3
Plan Fractions Treated to Date: 3
Plan Prescribed Dose Per Fraction: 1.8 Gy
Plan Prescribed Dose Per Fraction: 1.8 Gy
Plan Total Fractions Prescribed: 28
Plan Total Fractions Prescribed: 28
Plan Total Prescribed Dose: 50.4 Gy
Plan Total Prescribed Dose: 50.4 Gy
Reference Point Dosage Given to Date: 5.4 Gy
Reference Point Dosage Given to Date: 5.4 Gy
Reference Point Session Dosage Given: 1.8 Gy
Reference Point Session Dosage Given: 1.8 Gy
Session Number: 3

## 2022-04-01 ENCOUNTER — Ambulatory Visit
Admission: RE | Admit: 2022-04-01 | Discharge: 2022-04-01 | Disposition: A | Discharge status: Home / Self Care | Payer: Medicare Other | Source: Ambulatory Visit | Attending: Radiation Oncology | Admitting: Radiation Oncology

## 2022-04-01 ENCOUNTER — Other Ambulatory Visit: Payer: Self-pay

## 2022-04-01 DIAGNOSIS — Z510 Encounter for antineoplastic radiation therapy: Secondary | ICD-10-CM | POA: Diagnosis not present

## 2022-04-01 LAB — RAD ONC ARIA SESSION SUMMARY
Course Elapsed Days: 6
Plan Fractions Treated to Date: 4
Plan Fractions Treated to Date: 4
Plan Prescribed Dose Per Fraction: 1.8 Gy
Plan Prescribed Dose Per Fraction: 1.8 Gy
Plan Total Fractions Prescribed: 28
Plan Total Fractions Prescribed: 28
Plan Total Prescribed Dose: 50.4 Gy
Plan Total Prescribed Dose: 50.4 Gy
Reference Point Dosage Given to Date: 7.2 Gy
Reference Point Dosage Given to Date: 7.2 Gy
Reference Point Session Dosage Given: 1.8 Gy
Reference Point Session Dosage Given: 1.8 Gy
Session Number: 4

## 2022-04-02 ENCOUNTER — Other Ambulatory Visit: Payer: Self-pay

## 2022-04-02 ENCOUNTER — Ambulatory Visit
Admission: RE | Admit: 2022-04-02 | Discharge: 2022-04-02 | Disposition: A | Discharge status: Home / Self Care | Payer: Medicare Other | Source: Ambulatory Visit | Attending: Radiation Oncology | Admitting: Radiation Oncology

## 2022-04-02 DIAGNOSIS — Z510 Encounter for antineoplastic radiation therapy: Secondary | ICD-10-CM | POA: Diagnosis not present

## 2022-04-02 LAB — RAD ONC ARIA SESSION SUMMARY
Course Elapsed Days: 7
Plan Fractions Treated to Date: 5
Plan Fractions Treated to Date: 5
Plan Prescribed Dose Per Fraction: 1.8 Gy
Plan Prescribed Dose Per Fraction: 1.8 Gy
Plan Total Fractions Prescribed: 28
Plan Total Fractions Prescribed: 28
Plan Total Prescribed Dose: 50.4 Gy
Plan Total Prescribed Dose: 50.4 Gy
Reference Point Dosage Given to Date: 9 Gy
Reference Point Dosage Given to Date: 9 Gy
Reference Point Session Dosage Given: 1.8 Gy
Reference Point Session Dosage Given: 1.8 Gy
Session Number: 5

## 2022-04-03 ENCOUNTER — Ambulatory Visit
Admission: RE | Admit: 2022-04-03 | Discharge: 2022-04-03 | Disposition: A | Discharge status: Home / Self Care | Payer: Medicare Other | Source: Ambulatory Visit | Attending: Radiation Oncology | Admitting: Radiation Oncology

## 2022-04-03 ENCOUNTER — Other Ambulatory Visit: Payer: Self-pay

## 2022-04-03 DIAGNOSIS — Z510 Encounter for antineoplastic radiation therapy: Secondary | ICD-10-CM | POA: Diagnosis not present

## 2022-04-03 LAB — RAD ONC ARIA SESSION SUMMARY

## 2022-04-04 ENCOUNTER — Ambulatory Visit: Payer: Medicare Other

## 2022-04-07 ENCOUNTER — Ambulatory Visit: Payer: Medicare Other

## 2022-04-08 ENCOUNTER — Ambulatory Visit
Admission: RE | Admit: 2022-04-08 | Discharge: 2022-04-08 | Disposition: A | Discharge status: Home / Self Care | Payer: Medicare Other | Source: Ambulatory Visit | Attending: Radiation Oncology | Admitting: Radiation Oncology

## 2022-04-08 ENCOUNTER — Ambulatory Visit: Payer: Medicare Other

## 2022-04-08 ENCOUNTER — Other Ambulatory Visit: Payer: Self-pay

## 2022-04-08 DIAGNOSIS — C50412 Malignant neoplasm of upper-outer quadrant of left female breast: Secondary | ICD-10-CM | POA: Diagnosis present

## 2022-04-08 DIAGNOSIS — Z510 Encounter for antineoplastic radiation therapy: Secondary | ICD-10-CM | POA: Diagnosis present

## 2022-04-08 LAB — RAD ONC ARIA SESSION SUMMARY
Course Elapsed Days: 13
Plan Fractions Treated to Date: 7
Plan Fractions Treated to Date: 7
Plan Prescribed Dose Per Fraction: 1.8 Gy
Plan Prescribed Dose Per Fraction: 1.8 Gy
Plan Total Fractions Prescribed: 28
Plan Total Fractions Prescribed: 28
Plan Total Prescribed Dose: 50.4 Gy
Plan Total Prescribed Dose: 50.4 Gy
Reference Point Dosage Given to Date: 12.6 Gy
Reference Point Dosage Given to Date: 12.6 Gy
Reference Point Session Dosage Given: 1.8 Gy
Reference Point Session Dosage Given: 1.8 Gy
Session Number: 7

## 2022-04-09 ENCOUNTER — Ambulatory Visit: Payer: Medicare Other

## 2022-04-10 ENCOUNTER — Other Ambulatory Visit: Payer: Self-pay

## 2022-04-10 ENCOUNTER — Ambulatory Visit
Admission: RE | Admit: 2022-04-10 | Discharge: 2022-04-10 | Disposition: A | Discharge status: Home / Self Care | Payer: Medicare Other | Source: Ambulatory Visit | Attending: Radiation Oncology | Admitting: Radiation Oncology

## 2022-04-10 DIAGNOSIS — Z510 Encounter for antineoplastic radiation therapy: Secondary | ICD-10-CM | POA: Diagnosis not present

## 2022-04-10 LAB — RAD ONC ARIA SESSION SUMMARY
Course Elapsed Days: 15
Plan Fractions Treated to Date: 8
Plan Fractions Treated to Date: 8
Plan Prescribed Dose Per Fraction: 1.8 Gy
Plan Prescribed Dose Per Fraction: 1.8 Gy
Plan Total Fractions Prescribed: 28
Plan Total Fractions Prescribed: 28
Plan Total Prescribed Dose: 50.4 Gy
Plan Total Prescribed Dose: 50.4 Gy
Reference Point Dosage Given to Date: 14.4 Gy
Reference Point Dosage Given to Date: 14.4 Gy
Reference Point Session Dosage Given: 1.8 Gy
Reference Point Session Dosage Given: 1.8 Gy
Session Number: 8

## 2022-04-11 ENCOUNTER — Ambulatory Visit
Admission: RE | Admit: 2022-04-11 | Discharge: 2022-04-11 | Disposition: A | Discharge status: Home / Self Care | Payer: Medicare Other | Source: Ambulatory Visit | Attending: Radiation Oncology | Admitting: Radiation Oncology

## 2022-04-11 ENCOUNTER — Encounter: Payer: Self-pay | Admitting: Radiation Oncology

## 2022-04-11 ENCOUNTER — Other Ambulatory Visit: Payer: Self-pay

## 2022-04-11 DIAGNOSIS — Z510 Encounter for antineoplastic radiation therapy: Secondary | ICD-10-CM | POA: Diagnosis not present

## 2022-04-11 LAB — RAD ONC ARIA SESSION SUMMARY
Course Elapsed Days: 16
Plan Fractions Treated to Date: 9
Plan Fractions Treated to Date: 9
Plan Prescribed Dose Per Fraction: 1.8 Gy
Plan Prescribed Dose Per Fraction: 1.8 Gy
Plan Total Fractions Prescribed: 28
Plan Total Fractions Prescribed: 28
Plan Total Prescribed Dose: 50.4 Gy
Plan Total Prescribed Dose: 50.4 Gy
Reference Point Dosage Given to Date: 16.2 Gy
Reference Point Dosage Given to Date: 16.2 Gy
Reference Point Session Dosage Given: 1.8 Gy
Reference Point Session Dosage Given: 1.8 Gy
Session Number: 9

## 2022-04-14 ENCOUNTER — Ambulatory Visit: Payer: Medicare Other

## 2022-04-14 ENCOUNTER — Telehealth: Payer: Self-pay | Admitting: Hematology and Oncology

## 2022-04-14 NOTE — Telephone Encounter (Signed)
Contacted patient to scheduled appointments. Left message with appointment details and a call back number if patient had any questions or could not accommodate the time we provided.   

## 2022-04-15 ENCOUNTER — Ambulatory Visit: Payer: Medicare Other

## 2022-04-16 ENCOUNTER — Ambulatory Visit: Payer: Medicare Other

## 2022-04-17 ENCOUNTER — Ambulatory Visit: Payer: Medicare Other

## 2022-04-18 ENCOUNTER — Ambulatory Visit: Payer: Medicare Other

## 2022-04-21 ENCOUNTER — Ambulatory Visit: Payer: Medicare Other

## 2022-04-22 ENCOUNTER — Ambulatory Visit: Payer: Medicare Other

## 2022-04-23 ENCOUNTER — Encounter: Payer: Self-pay | Admitting: Radiation Oncology

## 2022-04-23 ENCOUNTER — Ambulatory Visit: Payer: Medicare Other

## 2022-04-24 ENCOUNTER — Ambulatory Visit: Payer: Medicare Other | Admitting: Hematology and Oncology

## 2022-04-24 ENCOUNTER — Ambulatory Visit: Payer: Medicare Other

## 2022-04-25 ENCOUNTER — Ambulatory Visit: Payer: Medicare Other

## 2022-04-28 ENCOUNTER — Ambulatory Visit: Payer: Medicare Other

## 2022-04-29 ENCOUNTER — Inpatient Hospital Stay: Payer: Medicare Other | Attending: Hematology and Oncology | Admitting: Hematology and Oncology

## 2022-04-29 ENCOUNTER — Ambulatory Visit: Payer: Medicare Other

## 2022-04-30 ENCOUNTER — Ambulatory Visit: Payer: Medicare Other

## 2022-05-01 ENCOUNTER — Ambulatory Visit: Payer: Medicare Other

## 2022-05-02 ENCOUNTER — Ambulatory Visit: Payer: Medicare Other

## 2022-05-05 ENCOUNTER — Ambulatory Visit: Payer: Medicare Other

## 2022-05-06 ENCOUNTER — Ambulatory Visit: Payer: Medicare Other

## 2022-05-07 ENCOUNTER — Ambulatory Visit: Payer: Medicare Other

## 2022-05-08 ENCOUNTER — Ambulatory Visit: Payer: Medicare Other

## 2022-05-09 ENCOUNTER — Ambulatory Visit: Payer: Medicare Other

## 2022-05-09 ENCOUNTER — Encounter: Payer: Self-pay | Admitting: *Deleted

## 2022-05-12 ENCOUNTER — Ambulatory Visit: Payer: Medicare Other

## 2022-05-12 ENCOUNTER — Encounter: Payer: Self-pay | Admitting: *Deleted

## 2022-05-12 DIAGNOSIS — C50412 Malignant neoplasm of upper-outer quadrant of left female breast: Secondary | ICD-10-CM

## 2022-05-13 ENCOUNTER — Ambulatory Visit: Payer: Medicare Other

## 2022-05-13 NOTE — Progress Notes (Signed)
  Radiation Oncology         [phone]) [phone] ________________________________  Name: Tracie Atkins MRN: [id number]  Date: 04/23/2022  DOB: Jun 29, 1956  End of Treatment Note  Diagnosis:    Stage IIA, pT2N1M0, grade 2, ER/PR invasive ductal carcinoma of the left breast    Indication for treatment:  Curative       Radiation treatment dates:   03/26/22-04/11/22  Site/dose:   The patient initially was to receive a dose of 50.4 Gy in 28 fractions followed by a 10 Gy boost in 5 fractions to the left breast and regional nodes. She relocated to [place] in the midst of her therapy, and only received 9 fractions totaling 16.2 Gy. This was delivered using a 3-D conformal technique.   Narrative: The patient tolerated radiation treatment relatively well but relocated to [place] in the midst of her radiation treatment which was not known to our team in advance. She will continue her care there. We would be happy to see her back if her plans were to change, and share dosimetry records if needed with her medical team.  ________________________________     Carola Rhine, PAC

## 2022-05-14 ENCOUNTER — Ambulatory Visit: Payer: Medicare Other

## 2022-05-15 ENCOUNTER — Ambulatory Visit: Payer: Medicare Other

## 2022-05-16 ENCOUNTER — Ambulatory Visit: Payer: Medicare Other

## 2022-05-19 ENCOUNTER — Ambulatory Visit: Payer: Medicare Other

## 2022-05-20 ENCOUNTER — Ambulatory Visit: Payer: Medicare Other

## 2022-05-21 ENCOUNTER — Inpatient Hospital Stay: Payer: Medicare Other | Attending: Hematology and Oncology | Admitting: Hematology and Oncology

## 2022-06-16 ENCOUNTER — Encounter: Payer: Self-pay | Admitting: *Deleted

## 2022-06-16 ENCOUNTER — Telehealth: Payer: Self-pay | Admitting: Adult Health

## 2022-06-16 NOTE — Telephone Encounter (Signed)
Left voicemail for new appointment.

## 2022-06-17 ENCOUNTER — Encounter: Payer: Self-pay | Admitting: *Deleted

## 2022-06-18 ENCOUNTER — Ambulatory Visit: Payer: Medicare Other | Admitting: Adult Health

## 2022-07-11 ENCOUNTER — Telehealth: Payer: Self-pay | Admitting: Radiation Oncology

## 2022-07-11 NOTE — Telephone Encounter (Signed)
1/5 @ 12:30 pm Left voicemail for patient to call our office to be schedule for consult.

## 2022-07-14 ENCOUNTER — Other Ambulatory Visit (HOSPITAL_COMMUNITY): Payer: Self-pay | Admitting: Family Medicine

## 2022-07-14 DIAGNOSIS — R109 Unspecified abdominal pain: Secondary | ICD-10-CM

## 2022-07-14 NOTE — Progress Notes (Incomplete)
Radiation Oncology         [phone] ________________________________  Name: Tracie Atkins        MRN: [id number]  Date of Service: 07/17/2022 DOB: 24-Sep-1955  RJ:[id number], No Pcp Per  Stark Klein, MD     REFERRING PHYSICIAN: Stark Klein, MD   DIAGNOSIS: The encounter diagnosis was Malignant neoplasm of upper-outer quadrant of left breast in female, estrogen receptor positive ([place]).   HISTORY OF PRESENT ILLNESS: Tracie Atkins is a 67 y.o. female with a  diagnosis of left breast cancer. The patient noted a palpable lump in the left breast with pain in the right sided nipple discharge that was spontaneous and was seen for diagnostic mammogram in March 2023.  At that time a solitary tubular appearing structure in the subareolar breast was noted on the right side, and in the left breast a spiculated mass within the left outer breast measuring 2.6 cm by mammography and a probable enlarged left axillary lymph node was identified.  Targeted ultrasound showed a dilated duct in the right breast without evidence of intraductal mass or vascularity.  The left breast showed the mass in the 10:00 axis and by ultrasound measured 2.2 cm corresponding to the mammographic finding.  There were 2 abnormal appearing lymph nodes in the left axilla.  She underwent an MRI of the brain on 11/04/2021 showing a possible 4 mm lesion in the left lateral aspect of the pituitary gland.  She return for biopsy on 11/05/2021.  Her left breast showed grade 2 invasive ductal carcinoma that was ER/PR positive HER2 negative with a Ki-67 of 10%.  The specimen from the left axilla showed invasive ductal carcinoma though no nodal tissue was appreciated in the specimen.  She went on to proceed with bilateral lumpectomies and left sentinel lymph node biopsy on 01/15/2022.  The right breast specimen showed fibrous scar with hemosiderin and no evidence of carcinoma.  The left breast however showed invasive ductal carcinoma measuring 3.1 cm in  greatest dimension, grade 2 and margins were clear.  2 lymph nodes were sampled.  1 was negative for metastatic disease the other was consistent with metastatic carcinoma with extracapsular extension.  Her specimen was sent for Oncotype DX score and this resulted as a score of 11.    She went on to receive adjuvant radiotherapy to the left breast and regional lymph nodes which she started in September 2023 but abruptly discontinued it on April 11, 2022 after relocating to [place].***  PREVIOUS RADIATION THERAPY:   03/26/22-04/11/22  The patient initially was to receive a dose of 50.4 Gy in 28 fractions followed by a 10 Gy boost in 5 fractions to the left breast and regional nodes. She relocated to [place] in the midst of her therapy, and only received 9 fractions totaling 16.2 Gy. This was delivered using a 3-D conformal technique.    PAST MEDICAL HISTORY:  Past Medical History:  Diagnosis Date   Breast cancer ([place]) 2023   left   Chronic dental infection 8/189   multiple teeth removed and Post -op infection.   Depression    ETOH abuse    Fatty liver    Headache        PAST SURGICAL HISTORY: Past Surgical History:  Procedure Laterality Date   BREAST LUMPECTOMY WITH RADIOACTIVE SEED AND SENTINEL LYMPH NODE BIOPSY Left 01/15/2022   Procedure: LEFT BREAST SEED LOCALIZED LUMPECTOMY, LEFT SEED TARGETED AXILLARY LYMPH NODE BIOPSY, LEFT SENTINEL LYMPH NODE BIOPSY;  Surgeon: Stark Klein,  MD;  Location: [place];  Service: General;  Laterality: Left;   DENTAL SURGERY     RADIOACTIVE SEED GUIDED EXCISIONAL BREAST BIOPSY Right 01/15/2022   Procedure: RIGHT BREAST SEED LOCALIZED EXCISIONAL BIOPSY;  Surgeon: Stark Klein, MD;  Location: [place];  Service: General;  Laterality: Right;   TUBAL LIGATION       FAMILY HISTORY: No family history on file.   SOCIAL HISTORY:  reports that she has never smoked. She has never been exposed to tobacco smoke. She has never used smokeless tobacco. She reports  current alcohol use of about 1.0 standard drink of alcohol per week. She reports that she does not use drugs. The patient is single and lives in [place]. She's originally from [place]. She's accompanied by her son.    ALLERGIES: Patient has no known allergies.   MEDICATIONS:  Current Outpatient Medications  Medication Sig Dispense Refill   FLUoxetine (PROZAC) 20 MG capsule Take 1 capsule (20 mg total) by mouth daily. 39 capsule 2   oxyCODONE (OXY IR/ROXICODONE) 5 MG immediate release tablet Take 1 tablet (5 mg total) by mouth every 6 (six) hours as needed for severe pain. 15 tablet 0   QUEtiapine (SEROQUEL) 200 MG tablet Take 200 mg by mouth at bedtime.     risperiDONE (RISPERDAL) 0.5 MG tablet Take 0.5 mg by mouth 2 (two) times daily.     topiramate (TOPAMAX) 50 MG tablet Take 1 tablet (50 mg total) by mouth 2 (two) times daily. 60 tablet 2   traZODone (DESYREL) 100 MG tablet Take 100 mg by mouth at bedtime as needed for sleep.     zolpidem (AMBIEN) 5 MG tablet Take 5 mg by mouth at bedtime as needed for sleep. (Patient not taking: Reported on 03/11/2022)     No current facility-administered medications for this visit.     REVIEW OF SYSTEMS: On review of systems, the patient reports that she is doing pretty well since surgery. She denies any breast concerns at this time with healing or with her right breast. No other complaints are verbalized.      PHYSICAL EXAM:  Wt Readings from Last 3 Encounters:  03/12/22 203 lb 8 oz (92.3 kg)  03/11/22 200 lb 6.4 oz (90.9 kg)  03/11/22 200 lb 4 oz (90.8 kg)   Temp Readings from Last 3 Encounters:  03/12/22 97.7 F (36.5 C) (Temporal)  03/11/22 98.4 F (36.9 C)  03/11/22 98.4 F (36.9 C) (Oral)   BP Readings from Last 3 Encounters:  03/12/22 (!) 159/88  03/11/22 (!) 148/79  03/11/22 (!) 148/79   Pulse Readings from Last 3 Encounters:  03/12/22 73  03/11/22 80  03/11/22 80    In general this is a well appearing  caucasian female in no acute distress. She's alert and oriented x4 and appropriate throughout the examination. Cardiopulmonary assessment is negative for acute distress and she exhibits normal effort. Bilateral breast exam is deferred.    ECOG = 0  0 - Asymptomatic (Fully active, able to carry on all predisease activities without restriction)  1 - Symptomatic but completely ambulatory (Restricted in physically strenuous activity but ambulatory and able to carry out work of a light or sedentary nature. For example, light housework, office work)  2 - Symptomatic, <50% in bed during the day (Ambulatory and capable of all self care but unable to carry out any work activities. Up and about more than 50% of waking hours)  3 - Symptomatic, >50% in bed, but  not bedbound (Capable of only limited self-care, confined to bed or chair 50% or more of waking hours)  4 - Bedbound (Completely disabled. Cannot carry on any self-care. Totally confined to bed or chair)  5 - Death   Eustace Pen MM, Creech RH, Tormey DC, et al. [phone]). "Toxicity and response criteria of the [facility]". [place] Oncol. 5 (6): 649-55    LABORATORY DATA:  Lab Results  Component Value Date   WBC 7.9 01/22/2022   HGB 11.4 (L) 01/22/2022   HCT 36.8 01/22/2022   MCV 79.8 (L) 01/22/2022   PLT 298 01/22/2022   Lab Results  Component Value Date   NA 140 01/22/2022   K 3.8 01/22/2022   CL 109 01/22/2022   CO2 24 01/22/2022   Lab Results  Component Value Date   ALT 18 01/22/2022   AST 21 01/22/2022   ALKPHOS 154 (H) 01/22/2022   BILITOT 0.2 (L) 01/22/2022      RADIOGRAPHY: No results found.     IMPRESSION/PLAN: 1. 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   2. 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 We will refer her for neurosurgical assessment to determine next steps.   In a visit lasting *** minutes, greater than 50% of the time was spent face to face  reviewing her case, as well as in preparation of, discussing, and coordinating the patient's care.  The above documentation reflects my direct findings during this shared patient visit. Please see the separate note by Dr. Lisbeth Renshaw on this date for the remainder of the patient's plan of care.    Carola Rhine, [facility]    **Disclaimer: This note was dictated with voice recognition software. Similar sounding words can inadvertently be transcribed and this note may contain transcription errors which may not have been corrected upon publication of note.**

## 2022-07-17 ENCOUNTER — Ambulatory Visit: Payer: 59 | Admitting: Radiation Oncology

## 2022-07-17 ENCOUNTER — Telehealth: Payer: Self-pay | Admitting: Radiation Oncology

## 2022-07-17 ENCOUNTER — Ambulatory Visit: Admission: RE | Admit: 2022-07-17 | Payer: 59 | Source: Ambulatory Visit | Admitting: Radiation Oncology

## 2022-07-17 ENCOUNTER — Ambulatory Visit: Payer: 59

## 2022-07-17 DIAGNOSIS — Z170 Estrogen receptor positive status [ER+]: Secondary | ICD-10-CM

## 2022-07-17 NOTE — Telephone Encounter (Signed)
1/11 @ 4:08 pm Left voicemail for patient to call our office to be reschedule for her missed appointment on today.

## 2022-07-21 NOTE — Progress Notes (Signed)
Radiation Oncology         [phone] ________________________________   Outpatient Re-Consultation - Conducted via telephone at patient request.  I spoke with the patient to conduct this consult visit via telephone. The patient was notified in advance and was offered an in person or telemedicine meeting to allow for face to face communication but instead preferred to proceed with a telephone re-consult.    ________________________________  Name: Tracie Atkins        MRN: [id number]  Date of Service: 07/23/2022 DOB: Mar 24, 1956  WU:[id number], No Pcp Per  Loura Pardon, MD     REFERRING PHYSICIAN: Loura Pardon, MD   DIAGNOSIS: The encounter diagnosis was Malignant neoplasm of upper-outer quadrant of left breast in female, estrogen receptor positive ([place]).   HISTORY OF PRESENT ILLNESS: Tracie Atkins is a 67 y.o. female with a  diagnosis of left breast cancer. The patient noted a palpable lump in the left breast with pain in the right sided nipple discharge that was spontaneous and was seen for diagnostic mammogram in March 2023.  At that time a solitary tubular appearing structure in the subareolar breast was noted on the right side, and in the left breast a spiculated mass within the left outer breast measuring 2.6 cm by mammography and a probable enlarged left axillary lymph node was identified.  Targeted ultrasound showed a dilated duct in the right breast without evidence of intraductal mass or vascularity.  The left breast showed the mass in the 10:00 axis and by ultrasound measured 2.2 cm corresponding to the mammographic finding.  There were 2 abnormal appearing lymph nodes in the left axilla.  She underwent an MRI of the brain on 11/04/2021 showing a possible 4 mm lesion in the left lateral aspect of the pituitary gland.  She return for biopsy on 11/05/2021.  Her left breast showed grade 2 invasive ductal carcinoma that was ER/PR positive HER2 negative with a Ki-67 of 10%.  The specimen  from the left axilla showed invasive ductal carcinoma though no nodal tissue was appreciated in the specimen.  She went on to proceed with bilateral lumpectomies and left sentinel lymph node biopsy on 01/15/2022.  The right breast specimen showed fibrous scar with hemosiderin and no evidence of carcinoma.  The left breast however showed invasive ductal carcinoma measuring 3.1 cm in greatest dimension, grade 2 and margins were clear.  2 lymph nodes were sampled.  1 was negative for metastatic disease the other was consistent with metastatic carcinoma with extracapsular extension.  Her specimen was sent for Oncotype DX score and this resulted as a score of 11.    She went on to begin adjuvant radiotherapy to the left breast and regional lymph nodes which she started in September 2023 but abruptly discontinued it on April 11, 2022 after relocating to [place]. She reports she did not seek additional care in [place] and has not taken antiestrogen therapy. When she relocated back to [facility], her PCP referred her back to revisit radiation. She's contacted by phone today to review this.   PREVIOUS RADIATION THERAPY:   03/26/22-04/11/22  The patient initially was to receive a dose of 50.4 Gy in 28 fractions followed by a 10 Gy boost in 5 fractions to the left breast and regional nodes. She relocated to [place] in the midst of her therapy, and only received 9 fractions totaling 16.2 Gy. This was delivered using a 3-D conformal technique.    PAST MEDICAL HISTORY:  Past Medical History:  Diagnosis Date   Breast cancer ([place]) 2023   left   Chronic dental infection 8/189   multiple teeth removed and Post -op infection.   Depression    ETOH abuse    Fatty liver    Headache        PAST SURGICAL HISTORY: Past Surgical History:  Procedure Laterality Date   BREAST LUMPECTOMY WITH RADIOACTIVE SEED AND SENTINEL LYMPH NODE BIOPSY Left 01/15/2022   Procedure: LEFT BREAST SEED LOCALIZED LUMPECTOMY, LEFT SEED TARGETED  AXILLARY LYMPH NODE BIOPSY, LEFT SENTINEL LYMPH NODE BIOPSY;  Surgeon: Stark Klein, MD;  Location: [place];  Service: General;  Laterality: Left;   DENTAL SURGERY     RADIOACTIVE SEED GUIDED EXCISIONAL BREAST BIOPSY Right 01/15/2022   Procedure: RIGHT BREAST SEED LOCALIZED EXCISIONAL BIOPSY;  Surgeon: Stark Klein, MD;  Location: [place];  Service: General;  Laterality: Right;   TUBAL LIGATION       FAMILY HISTORY: No family history on file.   SOCIAL HISTORY:  reports that she has never smoked. She has never been exposed to tobacco smoke. She has never used smokeless tobacco. She reports current alcohol use of about 1.0 standard drink of alcohol per week. She reports that she does not use drugs. The patient is single and lives in [place]. She's originally from [place].     ALLERGIES: Patient has no known allergies.   MEDICATIONS:  Current Outpatient Medications  Medication Sig Dispense Refill   FLUoxetine (PROZAC) 20 MG capsule Take 1 capsule (20 mg total) by mouth daily. 39 capsule 2   oxyCODONE (OXY IR/ROXICODONE) 5 MG immediate release tablet Take 1 tablet (5 mg total) by mouth every 6 (six) hours as needed for severe pain. 15 tablet 0   QUEtiapine (SEROQUEL) 200 MG tablet Take 200 mg by mouth at bedtime.     risperiDONE (RISPERDAL) 0.5 MG tablet Take 0.5 mg by mouth 2 (two) times daily.     topiramate (TOPAMAX) 50 MG tablet Take 1 tablet (50 mg total) by mouth 2 (two) times daily. 60 tablet 2   traZODone (DESYREL) 100 MG tablet Take 100 mg by mouth at bedtime as needed for sleep.     zolpidem (AMBIEN) 5 MG tablet Take 5 mg by mouth at bedtime as needed for sleep. (Patient not taking: Reported on 03/11/2022)     No current facility-administered medications for this visit.     REVIEW OF SYSTEMS: On review of systems, the patient has left breast pain when laying on her side at night. She has not been on any antiestrogen therapy or had any additional imaging of the breast  since her radiation. She's not seen either of her other breast doctors.  She does have some headaches managed by tylenol but denies changes in vision, speech, or movement. No other complaints are verbalized.   PHYSICAL EXAM:  Unable to assess due to encounter type.    ECOG = 1  0 - Asymptomatic (Fully active, able to carry on all predisease activities without restriction)  1 - Symptomatic but completely ambulatory (Restricted in physically strenuous activity but ambulatory and able to carry out work of a light or sedentary nature. For example, light housework, office work)  2 - Symptomatic, <50% in bed during the day (Ambulatory and capable of all self care but unable to carry out any work activities. Up and about more than 50% of waking hours)  3 - Symptomatic, >50% in bed, but not bedbound (Capable of only limited self-care, confined  to bed or chair 50% or more of waking hours)  4 - Bedbound (Completely disabled. Cannot carry on any self-care. Totally confined to bed or chair)  5 - Death   Eustace Pen MM, Creech RH, Tormey DC, et al. [phone]). "Toxicity and response criteria of the [facility]". [place] Oncol. 5 (6): 649-55    LABORATORY DATA:  Lab Results  Component Value Date   WBC 7.9 01/22/2022   HGB 11.4 (L) 01/22/2022   HCT 36.8 01/22/2022   MCV 79.8 (L) 01/22/2022   PLT 298 01/22/2022   Lab Results  Component Value Date   NA 140 01/22/2022   K 3.8 01/22/2022   CL 109 01/22/2022   CO2 24 01/22/2022   Lab Results  Component Value Date   ALT 18 01/22/2022   AST 21 01/22/2022   ALKPHOS 154 (H) 01/22/2022   BILITOT 0.2 (L) 01/22/2022      RADIOGRAPHY: No results found.     IMPRESSION/PLAN: 1. 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  Dr. Lisbeth Renshaw is aware of her case and has reviewed her prior treatment course which was disrupted. The patient is now back in [place] and plans to stay here  permanently. She is interested in trying to resume her prior radiation course. She is as aware of the role of external radiotherapy to the breast, which is  to reduce risks of local recurrence. She has also been counseled previously on adjuvant antiestrogen therapy. She has not been on any therapy since discontinuing radiation, and with her symptoms, we will ask her to go back for re-evaluation at the breast center prior to proceeding as  Dr. Lisbeth Renshaw is willing to try to reconstruct her prior treatment plan but she would need re-simulation. We discussed the risks, benefits, short, and long term effects of radiotherapy, as well as the curative intent, and the patient is interested in proceeding. We reviewed the delivery and logistics of radiotherapy and anticipates a course of 5- 51/2 weeks of radiotherapy, with plans to forgo the boost due to her prior therapy. The patient will be contacted to coordinate treatment planning by our simulation department after evaluation in the breast center. She may not need to sign another consent but I will check with our staff and would sign this if needed at the time of simulation. She is in agreement with this plan. I will also contact the navigators and her breast oncology team so they're aware of her and so that she can be followed after radiation. 2. 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 We will refer her for Dr. Mickeal Skinner for assessment to determine next steps.   This encounter was conducted via telephone.  The patient has provided two factor identification and has given verbal consent for this type of encounter and has been advised to only accept a meeting of this type in a secure network environment. The time spent during this encounter was 45 minutes including preparation, discussion, and coordination of the patient's care. The attendants for this meeting include  Hayden [name]  and [name].  During the encounter,   Hayden [name] was located at [facility] Radiation Oncology Department.  Tracie Atkins was located at home.     Carola Rhine, [facility]    **Disclaimer: This note was dictated with voice recognition software. Similar sounding words can inadvertently be transcribed and this note may contain transcription errors which may not have been corrected upon  publication of note.**

## 2022-07-23 ENCOUNTER — Ambulatory Visit
Admission: RE | Admit: 2022-07-23 | Discharge: 2022-07-23 | Disposition: A | Discharge status: Home / Self Care | Payer: 59 | Source: Ambulatory Visit | Attending: Radiation Oncology | Admitting: Radiation Oncology

## 2022-07-23 ENCOUNTER — Encounter: Payer: Self-pay | Admitting: Radiation Oncology

## 2022-07-23 ENCOUNTER — Other Ambulatory Visit: Payer: Self-pay | Admitting: Radiation Oncology

## 2022-07-23 ENCOUNTER — Other Ambulatory Visit: Payer: Self-pay | Admitting: *Deleted

## 2022-07-23 VITALS — Ht 62.0 in | Wt 190.0 lb

## 2022-07-23 DIAGNOSIS — C50412 Malignant neoplasm of upper-outer quadrant of left female breast: Secondary | ICD-10-CM

## 2022-07-23 DIAGNOSIS — Z170 Estrogen receptor positive status [ER+]: Secondary | ICD-10-CM

## 2022-07-23 DIAGNOSIS — D497 Neoplasm of unspecified behavior of endocrine glands and other parts of nervous system: Secondary | ICD-10-CM

## 2022-07-23 NOTE — Progress Notes (Signed)
Telephone Follow-Up-New nursing interview for a 67 year old female w/ Malignant neoplasm of upper-outer quadrant of left breast in female, estrogen receptor positive ([place]).  I verified patient's identity and began nursing interview. Patient reports occasional, sharp LT breast pain 9/10, that worsens w/ pressure. No other issues conveyed at this time.  Meaningful use complete.  Patient aware of her 9:30am-07/23/2022 telephone appointment w/ Shona Simpson PA-C. I left my extension [phone] in case patient needs anything. Patient verbalized understanding.  This concludes the interview.   Leandra Kern, LPN

## 2022-07-27 ENCOUNTER — Emergency Department (HOSPITAL_COMMUNITY)
Admission: EM | Admit: 2022-07-27 | Discharge: 2022-07-27 | Disposition: A | Discharge status: Home / Self Care | Payer: 59 | Attending: Emergency Medicine | Admitting: Emergency Medicine

## 2022-07-27 ENCOUNTER — Encounter (HOSPITAL_COMMUNITY): Payer: Self-pay

## 2022-07-27 ENCOUNTER — Emergency Department (HOSPITAL_COMMUNITY): Payer: 59

## 2022-07-27 DIAGNOSIS — J069 Acute upper respiratory infection, unspecified: Secondary | ICD-10-CM | POA: Insufficient documentation

## 2022-07-27 DIAGNOSIS — R197 Diarrhea, unspecified: Secondary | ICD-10-CM | POA: Diagnosis not present

## 2022-07-27 DIAGNOSIS — Z20822 Contact with and (suspected) exposure to covid-19: Secondary | ICD-10-CM | POA: Diagnosis not present

## 2022-07-27 DIAGNOSIS — R5381 Other malaise: Secondary | ICD-10-CM

## 2022-07-27 DIAGNOSIS — R059 Cough, unspecified: Secondary | ICD-10-CM | POA: Diagnosis present

## 2022-07-27 LAB — CBC WITH DIFFERENTIAL/PLATELET
Abs Immature Granulocytes: 0.01 10*3/uL (ref 0.00–0.07)
Basophils Absolute: 0 10*3/uL (ref 0.0–0.1)
Basophils Relative: 0 %
Eosinophils Absolute: 0.1 10*3/uL (ref 0.0–0.5)
Eosinophils Relative: 1 %
HCT: 44.3 % (ref 36.0–46.0)
Hemoglobin: 13.7 g/dL (ref 12.0–15.0)
Immature Granulocytes: 0 %
Lymphocytes Relative: 13 %
Lymphs Abs: 0.9 10*3/uL (ref 0.7–4.0)
MCH: 25.6 pg — ABNORMAL LOW (ref 26.0–34.0)
MCHC: 30.9 g/dL (ref 30.0–36.0)
MCV: 82.6 fL (ref 80.0–100.0)
Monocytes Absolute: 0.6 10*3/uL (ref 0.1–1.0)
Monocytes Relative: 9 %
Neutro Abs: 5.3 10*3/uL (ref 1.7–7.7)
Neutrophils Relative %: 77 %
Platelets: 266 10*3/uL (ref 150–400)
RBC: 5.36 MIL/uL — ABNORMAL HIGH (ref 3.87–5.11)
RDW: 15.7 % — ABNORMAL HIGH (ref 11.5–15.5)
WBC: 6.9 10*3/uL (ref 4.0–10.5)
nRBC: 0 % (ref 0.0–0.2)

## 2022-07-27 LAB — URINALYSIS, ROUTINE W REFLEX MICROSCOPIC
Bilirubin Urine: NEGATIVE
Glucose, UA: NEGATIVE mg/dL
Hgb urine dipstick: NEGATIVE
Ketones, ur: NEGATIVE mg/dL
Leukocytes,Ua: NEGATIVE
Nitrite: NEGATIVE
Protein, ur: NEGATIVE mg/dL
Specific Gravity, Urine: 1.008 (ref 1.005–1.030)
pH: 6 (ref 5.0–8.0)

## 2022-07-27 LAB — COMPREHENSIVE METABOLIC PANEL
ALT: 19 U/L (ref 0–44)
AST: 22 U/L (ref 15–41)
Albumin: 3.5 g/dL (ref 3.5–5.0)
Alkaline Phosphatase: 149 U/L — ABNORMAL HIGH (ref 38–126)
Anion gap: 13 (ref 5–15)
BUN: 17 mg/dL (ref 8–23)
CO2: 26 mmol/L (ref 22–32)
Calcium: 9.6 mg/dL (ref 8.9–10.3)
Chloride: 100 mmol/L (ref 98–111)
Creatinine, Ser: 0.68 mg/dL (ref 0.44–1.00)
GFR, Estimated: >60 mL/min (ref >60)
Glucose, Bld: 141 mg/dL — ABNORMAL HIGH (ref 70–99)
Potassium: 3.6 mmol/L (ref 3.5–5.1)
Sodium: 139 mmol/L (ref 135–145)
Total Bilirubin: 0.5 mg/dL (ref 0.3–1.2)
Total Protein: 6.4 g/dL — ABNORMAL LOW (ref 6.5–8.1)

## 2022-07-27 LAB — LIPASE, BLOOD: Lipase: 40 U/L (ref 11–51)

## 2022-07-27 LAB — RESP PANEL BY RT-PCR (RSV, FLU A&B, COVID)  RVPGX2
Influenza A by PCR: NEGATIVE
Influenza B by PCR: NEGATIVE
Resp Syncytial Virus by PCR: NEGATIVE
SARS Coronavirus 2 by RT PCR: NEGATIVE

## 2022-07-27 MED ORDER — FAMOTIDINE 20 MG PO TABS
20.0000 mg | ORAL_TABLET | Freq: Once | ORAL | Status: AC
  Administered 2022-07-27: 20 mg via ORAL
  Filled 2022-07-27: qty 1

## 2022-07-27 MED ORDER — KETOROLAC TROMETHAMINE 15 MG/ML IJ SOLN
15.0000 mg | Freq: Once | INTRAMUSCULAR | Status: AC
  Administered 2022-07-27: 15 mg via INTRAVENOUS
  Filled 2022-07-27: qty 1

## 2022-07-27 MED ORDER — LACTATED RINGERS IV BOLUS
1000.0000 mL | Freq: Once | INTRAVENOUS | Status: AC
  Administered 2022-07-27: 1000 mL via INTRAVENOUS

## 2022-07-27 MED ORDER — ACETAMINOPHEN 500 MG PO TABS
1000.0000 mg | ORAL_TABLET | Freq: Once | ORAL | Status: AC
  Administered 2022-07-27: 1000 mg via ORAL
  Filled 2022-07-27: qty 2

## 2022-07-27 NOTE — ED Provider Triage Note (Signed)
Emergency Medicine Provider Triage Evaluation Note  Tracie Atkins , a 67 y.o. female  was evaluated in triage.  Pt presents emergency department with a myriad of complaints.  States she had 2-week history of dizziness, diarrhea, nausea, abdominal pain.  Describes dizziness as lightheadedness and feeling as if she is going to pass out when changing positions.  Reports persistent diarrhea for the past 2 weeks with 4-5 bowel movements a day.  Denies any recent extensive travel, antibiotic usage, camping/hiking.  Describes pain in the epigastric/right upper quadrant region.  Has taken no medications for this.  States she feels generally weak.  She also states she has had cough chronic in nature for the past few years as well as shortness of breath that has been unchanged for the past few years.  Denies fever, chest pain, urinary symptoms, hematochezia/melena, vomiting, visual disturbance, gait abnormality, difficulty speaking/swallowing. Review of Systems  Positive: See above Negative:   Physical Exam  BP (!) 154/94 (BP Location: Left Arm)   Pulse 100   Temp 98.3 F (36.8 C) (Oral)   Resp 16   Ht '5\' 4"'$  (1.626 m)   Wt 63.5 kg   SpO2 98%   BMI 24.03 kg/m  Gen:   Awake, no distress   Resp:  Normal effort  MSK:   Moves extremities without difficulty  Other:  Mild epigastric tenderness.  Cranial nerves III through VII grossly intact.  Patient able to ambulate without difficulty in triage room.    Medical Decision Making  Medically screening exam initiated at 1:51 PM.  Appropriate orders placed.  Tracie Atkins was informed that the remainder of the evaluation will be completed by another provider, this initial triage assessment does not replace that evaluation, and the importance of remaining in the ED until their evaluation is complete.     Wilnette Kales, [place] 07/27/22 1354

## 2022-07-27 NOTE — ED Provider Notes (Signed)
[place] EMERGENCY DEPARTMENT AT [facility] Provider Note   CSN: [id number] Arrival date & time: 07/27/22  1337     History Chief Complaint  Patient presents with   Abdominal Pain    HPI Tracie Atkins is a 67 y.o. female presenting for multiple complaints.  States she has had fever cough congestion over the last 3 weeks intermittently.  Seen by her PCP and given antibiotics and now has diarrhea. Is also being followed for malignant cancer.  Has an appointment tomorrow for reassessment.  Her main concern is diarrhea, stomach upset, fever cough and congestion..   Patient's recorded medical, surgical, social, medication list and allergies were reviewed in the Snapshot window as part of the initial history.   Review of Systems   Review of Systems  Constitutional:  Positive for fever. Negative for chills.  HENT:  Positive for congestion. Negative for ear pain and sore throat.   Eyes:  Negative for pain and visual disturbance.  Respiratory:  Positive for cough. Negative for shortness of breath.   Cardiovascular:  Negative for chest pain and palpitations.  Gastrointestinal:  Positive for diarrhea. Negative for abdominal pain and vomiting.  Genitourinary:  Negative for dysuria and hematuria.  Musculoskeletal:  Negative for arthralgias and back pain.  Skin:  Negative for color change and rash.  Neurological:  Negative for seizures and syncope.  All other systems reviewed and are negative.   Physical Exam Updated Vital Signs BP 129/82   Pulse 92   Temp 98.3 F (36.8 C) (Oral)   Resp 16   Ht '5\' 4"'$  (1.626 m)   Wt 63.5 kg   SpO2 94%   BMI 24.03 kg/m  Physical Exam Vitals and nursing note reviewed.  Constitutional:      General: She is not in acute distress.    Appearance: She is well-developed.  HENT:     Head: Normocephalic and atraumatic.  Eyes:     Conjunctiva/sclera: Conjunctivae normal.  Cardiovascular:     Rate and Rhythm: Normal rate and regular  rhythm.     Heart sounds: No murmur heard. Pulmonary:     Effort: Pulmonary effort is normal. No respiratory distress.     Breath sounds: Normal breath sounds.  Abdominal:     Palpations: Abdomen is soft.     Tenderness: There is no abdominal tenderness.  Musculoskeletal:        General: No swelling.     Cervical back: Neck supple.  Skin:    General: Skin is warm and dry.     Capillary Refill: Capillary refill takes less than 2 seconds.  Neurological:     Mental Status: She is alert.  Psychiatric:        Mood and Affect: Mood normal.      ED Course/ Medical Decision Making/ A&P Clinical Course as of 07/27/22 2201  Sun Jul 27, 2022  1538 Negative workup [CC]    Clinical Course User Index [CC] Tretha Sciara, MD    Procedures Procedures   Medications Ordered in ED Medications  lactated ringers bolus 1,000 mL (0 mLs Intravenous Stopped 07/27/22 1604)  acetaminophen (TYLENOL) tablet 1,000 mg (1,000 mg Oral Given 07/27/22 1458)  ketorolac (TORADOL) 15 MG/ML injection 15 mg (15 mg Intravenous Given 07/27/22 1458)  famotidine (PEPCID) tablet 20 mg (20 mg Oral Given 07/27/22 1458)   Medical Decision Making:   Tracie Atkins is a 67 y.o. female who presented to the ED today with subjective fever, cough, congestion detailed  above.    Patient's presentation is complicated by their history of advanced age.  Patient placed on continuous vitals and telemetry monitoring while in ED which was reviewed periodically.   Complete initial physical exam performed, notably the patient  was hemodynamically stable in no acute distress.  Posterior oropharynx illuminated and without obvious swelling or deformity.  Patient is without neck stiffness.    Reviewed and confirmed nursing documentation for past medical history, family history, social history.    Initial Assessment:   With the patient's presentation of fever cough congestion, most likely diagnosis is developing viral upper respiratory  infection. Other diagnoses were considered including (but not limited to) peritonsillar abscess, retropharyngeal abscess, pneumonia. These are considered less likely due to history of present illness and physical exam findings.   This is most consistent with an acute life/limb threatening illness complicated by underlying chronic conditions. Considered meningitis, however patient's symptoms, vital signs, physical exam findings including lack of meningismus seem grossly less consistent at this time. Initial Plan:  Screening labs including CBC and Metabolic panel to evaluate for infectious or metabolic etiology of disease.  Viral screening including COVID/flu testing to evaluate for common viral etiologies that need to be tracked CXR to evaluate for structural/infectious intrathoracic pathology.  Empiric treatment with antipyretics including acetaminophen in ambulatory setting Will augment with dose of ketorolac in ED  Objective evaluation as below reviewed   Initial Study Results:   Laboratory  All laboratory results reviewed without evidence of clinically relevant pathology.   Radiology:  All images reviewed independently. Agree with radiology report at this time.   DG Chest Portable 1 View  Final Result         Final Assessment and Plan:   On reassessment, patient is ambulatory tolerating p.o. intake in no acute distress.   Patient's COVID test is negative. Ultimately no acute pathology identified today.  Vital signs stabilized, patient ambulatory tolerating p.o. intake in no acute distress. Given clinical stability, recommended close follow-up with PCP for reassessment.  Symptoms are early in their development and patient was instructed to return if symptoms continue to worsen.  Symptoms grossly resolved at this time.  Patient is currently stable for outpatient care and management with no indication for hospitalization or transfer at this time.  Discussed all findings with patient  expressed understanding.  Disposition:  Based on the above findings, I believe patient is stable for discharge.    Patient/family educated about specific return precautions for given chief complaint and symptoms.  Patient/family educated about follow-up with PCP.     Patient/family expressed understanding of return precautions and need for follow-up. Patient spoken to regarding all imaging and laboratory results and appropriate follow up for these results. All education provided in verbal form with additional information in written form. Time was allowed for answering of patient questions. Patient discharged.    Emergency Department Medication Summary:   Medications  lactated ringers bolus 1,000 mL (0 mLs Intravenous Stopped 07/27/22 1604)  acetaminophen (TYLENOL) tablet 1,000 mg (1,000 mg Oral Given 07/27/22 1458)  ketorolac (TORADOL) 15 MG/ML injection 15 mg (15 mg Intravenous Given 07/27/22 1458)  famotidine (PEPCID) tablet 20 mg (20 mg Oral Given 07/27/22 1458)       Clinical Impression:  1. Malaise   2. Upper respiratory tract infection, unspecified type   3. Diarrhea, unspecified type      Discharge   Final Clinical Impression(s) / ED Diagnoses Final diagnoses:  Malaise  Upper respiratory tract infection, unspecified type  Diarrhea, unspecified type    Rx / DC Orders ED Discharge Orders     None         Tretha Sciara, MD 07/27/22 2201

## 2022-07-27 NOTE — ED Triage Notes (Signed)
Pt c/o two weeks of URI symptoms and abd pain with diarrhea.

## 2022-07-28 ENCOUNTER — Telehealth (HOSPITAL_COMMUNITY): Payer: Self-pay

## 2022-07-28 ENCOUNTER — Ambulatory Visit
Admission: RE | Admit: 2022-07-28 | Discharge: 2022-07-28 | Disposition: A | Discharge status: Home / Self Care | Payer: 59 | Source: Ambulatory Visit | Attending: Radiation Oncology | Admitting: Radiation Oncology

## 2022-07-28 DIAGNOSIS — C50412 Malignant neoplasm of upper-outer quadrant of left female breast: Secondary | ICD-10-CM

## 2022-07-29 ENCOUNTER — Ambulatory Visit: Payer: 59 | Admitting: Internal Medicine

## 2022-07-29 ENCOUNTER — Encounter: Payer: Self-pay | Admitting: *Deleted

## 2022-07-29 ENCOUNTER — Telehealth: Payer: Self-pay | Admitting: Radiation Oncology

## 2022-07-29 NOTE — Telephone Encounter (Signed)
I spoke with the patient to give her the reassuring results from her mammogram and ultrasound. Dr. Lisbeth Renshaw would like to proceed with simulation and our staff will call her to coordinate this. She is in agreement with this plan.

## 2022-07-31 ENCOUNTER — Inpatient Hospital Stay: Payer: 59

## 2022-07-31 ENCOUNTER — Inpatient Hospital Stay: Payer: 59 | Attending: Hematology and Oncology | Admitting: Internal Medicine

## 2022-07-31 ENCOUNTER — Encounter (HOSPITAL_COMMUNITY): Payer: Self-pay

## 2022-07-31 ENCOUNTER — Other Ambulatory Visit: Payer: Self-pay

## 2022-07-31 ENCOUNTER — Ambulatory Visit (HOSPITAL_COMMUNITY): Payer: 59

## 2022-07-31 VITALS — BP 132/86 | HR 86 | Temp 97.9°F | Resp 18 | Wt 192.7 lb

## 2022-07-31 DIAGNOSIS — E237 Disorder of pituitary gland, unspecified: Secondary | ICD-10-CM | POA: Diagnosis present

## 2022-07-31 DIAGNOSIS — C50412 Malignant neoplasm of upper-outer quadrant of left female breast: Secondary | ICD-10-CM | POA: Insufficient documentation

## 2022-07-31 DIAGNOSIS — Z79899 Other long term (current) drug therapy: Secondary | ICD-10-CM | POA: Insufficient documentation

## 2022-07-31 DIAGNOSIS — Z170 Estrogen receptor positive status [ER+]: Secondary | ICD-10-CM | POA: Diagnosis not present

## 2022-07-31 DIAGNOSIS — E236 Other disorders of pituitary gland: Secondary | ICD-10-CM

## 2022-07-31 LAB — CORTISOL: Cortisol, Plasma: 7.3 ug/dL

## 2022-07-31 NOTE — Progress Notes (Signed)
[place] at [place], [place] [address] [phone]   New Patient Evaluation  Date of Service: 07/31/22 Patient Name: Tracie Atkins Patient MRN: [id number] Patient DOB: 01/10/1956 Provider: Ventura Sellers, MD  Identifying Statement:  Tracie Atkins is a 67 y.o. female with Pituitary mass [facility]) who presents for initial consultation and evaluation regarding cancer associated neurologic deficits.    Referring Provider: Hayden [name], PA-C [place],  [place] [address]  Primary Cancer:  Oncologic History: Oncology History  Malignant neoplasm of upper-outer quadrant of left breast in female, estrogen receptor positive ([place])  11/05/2021 Initial Diagnosis   Palpable lump in the left breast with the pain along with right-sided nipple discharge.  Mammogram reveals highly suspicious mass left breast 3 o'clock position 2.2 cm, 2 abnormal left axillary lymph nodes: Biopsy: Grade 2 IDC ER 100%, PR 0%, Ki-67 10%, HER2 2+ by IHC, FISH negative ratio 1.4, axillary lymph node biopsy: IDC, ER 100%, PR 20%, Ki-67 10%, HER2 negative ratio 1.31   11/28/2021 Breast MRI   Left breast cancer 2.7 cm and 0.7 cm linear enhancement UOQ left breast needs biopsy, 0.5 cm mass posterior to the right nipple needs biopsy, 2-lymph nodes left axilla, 1.5 cm sternal lesion   12/05/2021 Cancer Staging   Staging form: Breast, AJCC 8th Edition - Clinical: Stage IIA (cT2, cN1, cM0, G2, ER+, PR+, HER2-) - Signed by Nicholas Lose, MD on 12/05/2021 Stage prefix: Initial diagnosis Histologic grading system: 3 grade system   01/15/2022 Pathology Results   SURGICAL PATHOLOGY  CASE: [id number]  PATIENT: Tracie Atkins  Surgical Pathology Report      Clinical History: left breast cancer (cm)   FINAL MICROSCOPIC DIAGNOSIS:   A. BREAST, LEFT, LUMPECTOMY:  Invasive ductal carcinoma with clear margins of resection.  Please see the synoptic report after  specimen D.   B. BREAST, RIGHT, LUMPECTOMY:  Fibrous scar with hemosiderin deposits.  Carcinoma is not identified in the resected specimen.   C. LYMPH NODE, LEFT AXILLARY #2, SENTINEL, EXCISION:  A lymph node negative for metastatic carcinoma.   D. 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   pTNM classification ( AJCC 8th Edition): pT2, pN1a  Results of prognostic markers performed on prior biopsy from 11/05/2021  ([id number])           ER: Positive in 100% of tumor cells.           PR: Negative.           Ki-67: Positive in 10% of tumor cells.           HER-2/neu: 2+ by IHC/ Negative by FISH.    02/17/2022 Oncotype testing   Oncotype of 11, no benefit from chemotherapy.   03/26/2022 - 04/11/2022 Radiation Therapy   9 fractions totaling 16.2 Gy prior to relocation to [place]     History of Present Illness: The patient's records from the referring physician were obtained and reviewed and the patient interviewed to confirm this HPI.  Tracie Atkins presents today for evaluation given abnormal brain MRI.  Imaging was performed given elevated prolactin level and clear nipple discharge noted last spring.  MRI demonstrated small lesion c/w likely pituitary adenoma.  No follow up on this since May 2023.  She does have headaches in recent weeks, but attributes this to ongoing issues with dental infection.  Planning radiation for  breast cancer with Dr. Ida Rogue team, upcoming.  Medications: Current Outpatient Medications on File Prior to Visit  Medication Sig Dispense Refill   FLUoxetine (PROZAC) 20 MG capsule Take 1 capsule (20 mg total) by mouth daily. (Patient not taking: Reported on 07/23/2022) 39 capsule 2   oxyCODONE (OXY IR/ROXICODONE) 5 MG immediate release tablet Take 1 tablet (5 mg total) by mouth every 6 (six) hours as needed for severe pain. (Patient not taking: Reported on 07/23/2022) 15 tablet 0   QUEtiapine  (SEROQUEL) 200 MG tablet Take 200 mg by mouth at bedtime. (Patient not taking: Reported on 07/23/2022)     risperiDONE (RISPERDAL) 0.5 MG tablet Take 0.5 mg by mouth 2 (two) times daily. (Patient not taking: Reported on 07/23/2022)     topiramate (TOPAMAX) 50 MG tablet Take 1 tablet (50 mg total) by mouth 2 (two) times daily. (Patient not taking: Reported on 07/23/2022) 60 tablet 2   traZODone (DESYREL) 100 MG tablet Take 100 mg by mouth at bedtime as needed for sleep. (Patient not taking: Reported on 07/23/2022)     zolpidem (AMBIEN) 5 MG tablet Take 5 mg by mouth at bedtime as needed for sleep. (Patient not taking: Reported on 03/11/2022)     No current facility-administered medications on file prior to visit.    Allergies: No Known Allergies Past Medical History:  Past Medical History:  Diagnosis Date   Breast cancer ([place]) 2023   left   Chronic dental infection 8/189   multiple teeth removed and Post -op infection.   Depression    ETOH abuse    Fatty liver    Headache    Past Surgical History:  Past Surgical History:  Procedure Laterality Date   BREAST LUMPECTOMY WITH RADIOACTIVE SEED AND SENTINEL LYMPH NODE BIOPSY Left 01/15/2022   Procedure: LEFT BREAST SEED LOCALIZED LUMPECTOMY, LEFT SEED TARGETED AXILLARY LYMPH NODE BIOPSY, LEFT SENTINEL LYMPH NODE BIOPSY;  Surgeon: Stark Klein, MD;  Location: [facility];  Service: General;  Laterality: Left;   DENTAL SURGERY     RADIOACTIVE SEED GUIDED EXCISIONAL BREAST BIOPSY Right 01/15/2022   Procedure: RIGHT BREAST SEED LOCALIZED EXCISIONAL BIOPSY;  Surgeon: Stark Klein, MD;  Location: [facility];  Service: General;  Laterality: Right;   TUBAL LIGATION     Social History:  Social History   Socioeconomic History   Marital status: Single    Spouse name: Not on file   Number of children: 3   Years of education: Not on file   Highest education level: Not on file  Occupational History   Not on file  Tobacco Use   Smoking status: Never    Passive  exposure: Never   Smokeless tobacco: Never  Vaping Use   Vaping Use: Never used  Substance and Sexual Activity   Alcohol use: Yes    Alcohol/week: 1.0 standard drink of alcohol    Types: 1 Standard drinks or equivalent per week    Comment: 1 mixed (liquor) drink per week   Drug use: No   Sexual activity: Never  Other Topics Concern   Not on file  Social History Narrative   Not on file   Social Determinants of Health   Financial Resource Strain: Not on file  Food Insecurity: Not on file  Transportation Needs: Unmet Transportation Needs (07/30/2022)   PRAPARE - Hydrologist (Medical): Yes    Lack of Transportation (Non-Medical): No  Physical Activity: Not on file  Stress: Not on file  Social  Connections: Not on file  Intimate Partner Violence: Not on file   Family History: No family history on file.  Review of Systems: Constitutional: Doesn't report fevers, chills or abnormal weight loss Eyes: Doesn't report blurriness of vision Ears, nose, mouth, throat, and face: Doesn't report sore throat Respiratory: Doesn't report cough, dyspnea or wheezes Cardiovascular: Doesn't report palpitation, chest discomfort  Gastrointestinal:  Doesn't report nausea, constipation, diarrhea GU: Doesn't report incontinence Skin: Doesn't report skin rashes Neurological: Per HPI Musculoskeletal: Doesn't report joint pain Behavioral/Psych: Doesn't report anxiety  Physical Exam: Vitals:   07/31/22 1421  BP: 132/86  Pulse: 86  Resp: 18  Temp: 97.9 F (36.6 C)  SpO2: 98%   KPS: 80. General: Alert, cooperative, pleasant, in no acute distress Head: Normal EENT: No conjunctival injection or scleral icterus.  Lungs: Resp effort normal Cardiac: Regular rate Abdomen: Non-distended abdomen Skin: No rashes cyanosis or petechiae. Extremities: No clubbing or edema  Neurologic Exam: Mental Status: Awake, alert, attentive to examiner. Oriented to self and environment.  Language is fluent with intact comprehension.  Cranial Nerves: Visual acuity is grossly normal. Visual fields are full. Extra-ocular movements intact. No ptosis. Face is symmetric Motor: Tone and bulk are normal. Power is full in both arms and legs. Reflexes are symmetric, no pathologic reflexes present.  Sensory: Intact to light touch Gait: Normal.   Labs: I have reviewed the data as listed    Component Value Date/Time   NA 139 07/27/2022 1351   K 3.6 07/27/2022 1351   CL 100 07/27/2022 1351   CO2 26 07/27/2022 1351   GLUCOSE 141 (H) 07/27/2022 1351   BUN 17 07/27/2022 1351   CREATININE 0.68 07/27/2022 1351   CALCIUM 9.6 07/27/2022 1351   PROT 6.4 (L) 07/27/2022 1351   ALBUMIN 3.5 07/27/2022 1351   AST 22 07/27/2022 1351   ALT 19 07/27/2022 1351   ALKPHOS 149 (H) 07/27/2022 1351   BILITOT 0.5 07/27/2022 1351   GFRNONAA >60 07/27/2022 1351   GFRAA >60 06/12/2018 1323   Lab Results  Component Value Date   WBC 6.9 07/27/2022   NEUTROABS 5.3 07/27/2022   HGB 13.7 07/27/2022   HCT 44.3 07/27/2022   MCV 82.6 07/27/2022   PLT 266 07/27/2022    Imaging:  MM DIAG BREAST TOMO UNI LEFT  Result Date: 07/28/2022 CLINICAL DATA:  Status post left lumpectomy for breast cancer in July 2023. The patient started radiation therapy and then discontinued when she moved to [place]. She has subsequently moved back and is scheduled to begin receiving radiation therapy again. She had the clear pathological margins. Pretreatment diagnostic mammogram. Diffuse left breast tenderness. EXAM: DIGITAL DIAGNOSTIC UNILATERAL LEFT MAMMOGRAM WITH TOMOSYNTHESIS; ULTRASOUND LEFT BREAST LIMITED TECHNIQUE: Left digital diagnostic mammography and breast tomosynthesis was performed.; Targeted ultrasound examination of the left breast was performed. COMPARISON:  Previous exam(s). ACR Breast Density Category b: There are scattered areas of fibroglandular density. FINDINGS: Interval post lumpectomy changes at the  location of the previously demonstrated irregular, spiculated mass in the posterior outer left breast. There is a postoperative seroma and surgical clips at that location currently. There is irregular increased density and distortion at the anterior aspect of the postoperative seroma with obscuration of the anterior margins of the seroma. The previously demonstrated enlarged left axillary lymph node is absent with surgical clips. Targeted ultrasound is performed, showing a rind of hypoechoic scar tissue surrounding the patient's seroma in the 3 o'clock position of the left breast with anterior extension is corresponding  to the irregular density and distortion seen in that area mammographically. No mass or other findings suspicious for malignancy are seen. IMPRESSION: 1. Post lumpectomy changes in the 3 o'clock position of the left breast with an associated seroma and postoperative scar tissue. 2. No evidence of malignancy. RECOMMENDATION: Bilateral diagnostic mammogram in late March when due. That will be 1 year since mammographic evaluation of the right breast. I have discussed the findings and recommendations with the patient. If applicable, a reminder letter will be sent to the patient regarding the next appointment. BI-RADS CATEGORY  2: Benign. Electronically Signed   By: Claudie Revering M.D.   On: 07/28/2022 14:50  US BREAST LTD UNI LEFT INC AXILLA  Result Date: 07/28/2022 CLINICAL DATA:  Status post left lumpectomy for breast cancer in July 2023. The patient started radiation therapy and then discontinued when she moved to [place]. She has subsequently moved back and is scheduled to begin receiving radiation therapy again. She had the clear pathological margins. Pretreatment diagnostic mammogram. Diffuse left breast tenderness. EXAM: DIGITAL DIAGNOSTIC UNILATERAL LEFT MAMMOGRAM WITH TOMOSYNTHESIS; ULTRASOUND LEFT BREAST LIMITED TECHNIQUE: Left digital diagnostic mammography and breast tomosynthesis was  performed.; Targeted ultrasound examination of the left breast was performed. COMPARISON:  Previous exam(s). ACR Breast Density Category b: There are scattered areas of fibroglandular density. FINDINGS: Interval post lumpectomy changes at the location of the previously demonstrated irregular, spiculated mass in the posterior outer left breast. There is a postoperative seroma and surgical clips at that location currently. There is irregular increased density and distortion at the anterior aspect of the postoperative seroma with obscuration of the anterior margins of the seroma. The previously demonstrated enlarged left axillary lymph node is absent with surgical clips. Targeted ultrasound is performed, showing a rind of hypoechoic scar tissue surrounding the patient's seroma in the 3 o'clock position of the left breast with anterior extension is corresponding to the irregular density and distortion seen in that area mammographically. No mass or other findings suspicious for malignancy are seen. IMPRESSION: 1. Post lumpectomy changes in the 3 o'clock position of the left breast with an associated seroma and postoperative scar tissue. 2. No evidence of malignancy. RECOMMENDATION: Bilateral diagnostic mammogram in late March when due. That will be 1 year since mammographic evaluation of the right breast. I have discussed the findings and recommendations with the patient. If applicable, a reminder letter will be sent to the patient regarding the next appointment. BI-RADS CATEGORY  2: Benign. Electronically Signed   By: Claudie Revering M.D.   On: 07/28/2022 14:50  DG Chest Portable 1 View  Result Date: 07/27/2022 CLINICAL DATA:  Cough. EXAM: PORTABLE CHEST 1 VIEW COMPARISON:  Chest x-ray 01/02/2022 FINDINGS: The heart size and mediastinal contours are within normal limits. Both lungs are clear. The visualized skeletal structures are unremarkable. Left axillary surgical clips are present. Hiatal hernia is again noted.  IMPRESSION: 1. No active disease. 2. Hiatal hernia. Electronically Signed   By: Ronney Asters M.D.   On: 07/27/2022 15:10      Assessment/Plan Pituitary mass ([place])  Alvira Philips presents with clinical and radiographic syndrome most c/w pituitary microadenoma.  Though prolactin level was reported as elevated (71) earlier this year, the level is below what is typically seen for frank secreting adenoma.  In addition, nipple discharge was clear rather than milky, and resolved with surgical intervention on breast cancer.  We recommended repeating MRI brain in 3-4 months, with pituitary serum panel.  She is agreeable  with this.      We spent twenty additional minutes teaching regarding the natural history, biology, and historical experience in the treatment of neurologic complications of cancer.   We appreciate the opportunity to participate in the care of Tracie Atkins.   We ask that Tracie Atkins return to clinic in 4 months following next brain MRI, or sooner as needed.  All questions were answered. The patient knows to call the clinic with any problems, questions or concerns. No barriers to learning were detected.  The total time spent in the encounter was 40 minutes and more than 50% was on counseling and review of test results   Ventura Sellers, MD Medical Director of Neuro-Oncology [facility] 07/31/22 2:24 PM

## 2022-08-01 ENCOUNTER — Telehealth: Payer: Self-pay | Admitting: Internal Medicine

## 2022-08-01 LAB — INSULIN-LIKE GROWTH FACTOR: Somatomedin C: 246 ng/mL — ABNORMAL HIGH (ref 52–196)

## 2022-08-01 LAB — TSH: TSH: 1.108 u[IU]/mL (ref 0.350–4.500)

## 2022-08-01 NOTE — Telephone Encounter (Signed)
Called patient to schedule f/u. Left voicemail with new appointment details and contact information to set up lab work.

## 2022-08-01 NOTE — Telephone Encounter (Signed)
Called patient to schedule f/u appointments. Patient scheduled and notified. Forwarded patient information to transportation liaison to see if we can aid patient.

## 2022-08-02 LAB — PROLACTIN: Prolactin: 8.8 ng/mL (ref 3.6–25.2)

## 2022-08-02 LAB — TESTOSTERONE: Testosterone: 15 ng/dL (ref 3–67)

## 2022-08-05 ENCOUNTER — Other Ambulatory Visit: Payer: Self-pay | Admitting: Radiation Therapy

## 2022-08-13 ENCOUNTER — Telehealth: Payer: Self-pay

## 2022-08-13 NOTE — Telephone Encounter (Signed)
This nurse received a call from this patient stating that she is scheduled for an appointment on 2/15 at [facility] and she will not be able to attend due to her having another doctors appointment on 2/15.  This nurse informed patient that her appointment is scheduled for Tuesday 2/13 at 9 am.  Patient stated that she was pretty sure that she was told 2/15.  This nurse verified with Dr. Ida Rogue office that patient is scheduled for 2/13 at 9 am.  Patient is in agreement with that date and time and states that she needs to have transportation set up.  This nurse verified with Rad Onc nurse that a request for transportation has been sent for this patient.  Patient then asked this nurse what the appointment is for.  Nurse informed patient that she will be seeing Dr. Lisbeth Renshaw to get setup for her radiation treatments.  Patient acknowledged understanding and has no further questions or concerns at this time.

## 2022-08-15 ENCOUNTER — Encounter: Payer: Self-pay | Admitting: *Deleted

## 2022-08-19 ENCOUNTER — Ambulatory Visit
Admission: RE | Admit: 2022-08-19 | Discharge: 2022-08-19 | Disposition: A | Discharge status: Home / Self Care | Payer: 59 | Source: Ambulatory Visit | Attending: Radiation Oncology | Admitting: Radiation Oncology

## 2022-08-19 DIAGNOSIS — C50412 Malignant neoplasm of upper-outer quadrant of left female breast: Secondary | ICD-10-CM | POA: Insufficient documentation

## 2022-08-25 DIAGNOSIS — C50412 Malignant neoplasm of upper-outer quadrant of left female breast: Secondary | ICD-10-CM | POA: Diagnosis not present

## 2022-08-27 ENCOUNTER — Ambulatory Visit: Payer: 59 | Admitting: Radiation Oncology

## 2022-08-28 ENCOUNTER — Ambulatory Visit: Payer: 59

## 2022-08-29 ENCOUNTER — Ambulatory Visit: Payer: 59 | Admitting: Radiation Oncology

## 2022-08-29 ENCOUNTER — Ambulatory Visit: Payer: 59

## 2022-09-01 ENCOUNTER — Telehealth: Payer: Self-pay | Admitting: Radiation Oncology

## 2022-09-01 ENCOUNTER — Ambulatory Visit: Payer: 59

## 2022-09-01 ENCOUNTER — Ambulatory Visit: Payer: 59 | Admitting: Radiation Oncology

## 2022-09-01 NOTE — Telephone Encounter (Signed)
The patient has not shown for her treatments on multiple occasions. Our staff has tried to reach her about coming in for treatment. I was not able to leave a message for her, but left a message for her family member Janie Gilkerson asking to encourage her to call us back about her appointments. I also was able to reach her son Pressley Valenzano in her contacts, and he will try to help get in contact with her about her appointments.

## 2022-09-02 ENCOUNTER — Encounter: Payer: Self-pay | Admitting: Radiation Oncology

## 2022-09-02 ENCOUNTER — Inpatient Hospital Stay: Payer: 59 | Attending: Hematology and Oncology

## 2022-09-02 ENCOUNTER — Ambulatory Visit: Payer: 59 | Admitting: Radiation Oncology

## 2022-09-02 NOTE — Progress Notes (Signed)
  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      Indication for treatment: curative   Radiation treatment dates:   n/a  Site/planned dose:   The patient was to proceed with a dose of 50.4 Gy in 28 fractions to the breast and SCLV region using a 4-field approach with a 3-D conformal technique.    Narrative: The patient no showed multiple appointments and would not return calls about her treatment. Messages were also left with her family members.   Plan: The patient will follow up with medical oncology. We will see her as needed.       Carola Rhine, PAC

## 2022-09-03 ENCOUNTER — Inpatient Hospital Stay: Payer: 59

## 2022-09-03 ENCOUNTER — Ambulatory Visit: Payer: 59

## 2022-09-04 ENCOUNTER — Telehealth: Payer: Self-pay | Admitting: *Deleted

## 2022-09-04 ENCOUNTER — Ambulatory Visit: Payer: 59

## 2022-09-04 ENCOUNTER — Telehealth: Payer: Self-pay | Admitting: Hematology and Oncology

## 2022-09-04 ENCOUNTER — Encounter: Payer: Self-pay | Admitting: *Deleted

## 2022-09-04 ENCOUNTER — Inpatient Hospital Stay: Payer: 59

## 2022-09-04 NOTE — Telephone Encounter (Signed)
Noted pt no show for several xrt appts and EOT established by Dr. Lisbeth Renshaw. Scheduling msg sent to get pt in with Dr. Chryl Heck. Scheduling team reached out to pt with no answer and unable to leave vm d/t being full. Requested scheduling team to send a letter for appt reminder.

## 2022-09-04 NOTE — Telephone Encounter (Signed)
Per 2/29 IB reached out to patient, mailbox is full.

## 2022-09-05 ENCOUNTER — Ambulatory Visit: Payer: 59

## 2022-09-05 ENCOUNTER — Inpatient Hospital Stay: Payer: 59 | Attending: Hematology and Oncology

## 2022-09-08 ENCOUNTER — Ambulatory Visit: Payer: 59

## 2022-09-08 ENCOUNTER — Inpatient Hospital Stay: Payer: 59

## 2022-09-09 ENCOUNTER — Inpatient Hospital Stay: Payer: 59

## 2022-09-09 ENCOUNTER — Ambulatory Visit: Payer: 59

## 2022-09-10 ENCOUNTER — Inpatient Hospital Stay: Payer: 59

## 2022-09-10 ENCOUNTER — Ambulatory Visit: Payer: 59

## 2022-09-11 ENCOUNTER — Inpatient Hospital Stay: Payer: 59

## 2022-09-11 ENCOUNTER — Ambulatory Visit: Payer: 59

## 2022-09-12 ENCOUNTER — Inpatient Hospital Stay: Payer: 59

## 2022-09-12 ENCOUNTER — Ambulatory Visit: Payer: 59

## 2022-09-15 ENCOUNTER — Ambulatory Visit: Payer: 59

## 2022-09-16 ENCOUNTER — Ambulatory Visit: Payer: 59

## 2022-09-17 ENCOUNTER — Ambulatory Visit: Payer: 59

## 2022-09-18 ENCOUNTER — Ambulatory Visit: Payer: 59

## 2022-09-18 ENCOUNTER — Telehealth: Payer: Self-pay

## 2022-09-18 ENCOUNTER — Inpatient Hospital Stay: Payer: 59

## 2022-09-18 NOTE — Telephone Encounter (Signed)
Called and left a message with Arbutus Ped that Glenola does not need to come in for her scheduled lab appointment today per Dr. Mickeal Skinner.  Lativia's phone number was not working when attempted to call. Gardiner Rhyme, RN

## 2022-09-19 ENCOUNTER — Ambulatory Visit: Payer: 59

## 2022-09-22 ENCOUNTER — Ambulatory Visit: Payer: 59

## 2022-09-23 ENCOUNTER — Ambulatory Visit: Payer: 59

## 2022-09-24 ENCOUNTER — Ambulatory Visit: Payer: 59

## 2022-09-25 ENCOUNTER — Ambulatory Visit: Payer: 59

## 2022-09-26 ENCOUNTER — Ambulatory Visit: Payer: 59

## 2022-09-29 ENCOUNTER — Ambulatory Visit: Payer: 59

## 2022-09-30 ENCOUNTER — Ambulatory Visit: Payer: 59

## 2022-10-01 ENCOUNTER — Ambulatory Visit: Payer: 59

## 2022-10-02 ENCOUNTER — Ambulatory Visit: Payer: 59

## 2022-10-03 ENCOUNTER — Ambulatory Visit: Payer: 59

## 2022-10-06 ENCOUNTER — Ambulatory Visit: Payer: 59

## 2022-10-07 ENCOUNTER — Ambulatory Visit: Payer: 59

## 2022-10-08 ENCOUNTER — Ambulatory Visit: Payer: 59

## 2022-10-09 ENCOUNTER — Ambulatory Visit: Payer: 59

## 2022-10-11 ENCOUNTER — Emergency Department (HOSPITAL_COMMUNITY): Payer: 59

## 2022-10-11 ENCOUNTER — Other Ambulatory Visit: Payer: Self-pay

## 2022-10-11 ENCOUNTER — Emergency Department (HOSPITAL_COMMUNITY)
Admission: EM | Admit: 2022-10-11 | Discharge: 2022-10-11 | Disposition: A | Discharge status: Home / Self Care | Payer: 59 | Attending: Emergency Medicine | Admitting: Emergency Medicine

## 2022-10-11 DIAGNOSIS — R079 Chest pain, unspecified: Secondary | ICD-10-CM | POA: Insufficient documentation

## 2022-10-11 LAB — CBC WITH DIFFERENTIAL/PLATELET
Abs Immature Granulocytes: 0.02 10*3/uL (ref 0.00–0.07)
Basophils Absolute: 0 10*3/uL (ref 0.0–0.1)
Basophils Relative: 0 %
Eosinophils Absolute: 0 10*3/uL (ref 0.0–0.5)
Eosinophils Relative: 0 %
HCT: 38.5 % (ref 36.0–46.0)
Hemoglobin: 12.1 g/dL (ref 12.0–15.0)
Immature Granulocytes: 0 %
Lymphocytes Relative: 9 %
Lymphs Abs: 0.8 10*3/uL (ref 0.7–4.0)
MCH: 26.8 pg (ref 26.0–34.0)
MCHC: 31.4 g/dL (ref 30.0–36.0)
MCV: 85.4 fL (ref 80.0–100.0)
Monocytes Absolute: 0.7 10*3/uL (ref 0.1–1.0)
Monocytes Relative: 9 %
Neutro Abs: 6.8 10*3/uL (ref 1.7–7.7)
Neutrophils Relative %: 82 %
Platelets: 223 10*3/uL (ref 150–400)
RBC: 4.51 MIL/uL (ref 3.87–5.11)
RDW: 17 % — ABNORMAL HIGH (ref 11.5–15.5)
WBC: 8.4 10*3/uL (ref 4.0–10.5)
nRBC: 0 % (ref 0.0–0.2)

## 2022-10-11 LAB — BASIC METABOLIC PANEL
Anion gap: 9 (ref 5–15)
BUN: 13 mg/dL (ref 8–23)
CO2: 20 mmol/L — ABNORMAL LOW (ref 22–32)
Calcium: 8.5 mg/dL — ABNORMAL LOW (ref 8.9–10.3)
Chloride: 109 mmol/L (ref 98–111)
Creatinine, Ser: 0.67 mg/dL (ref 0.44–1.00)
GFR, Estimated: >60 mL/min (ref >60)
Glucose, Bld: 101 mg/dL — ABNORMAL HIGH (ref 70–99)
Potassium: 3.8 mmol/L (ref 3.5–5.1)
Sodium: 138 mmol/L (ref 135–145)

## 2022-10-11 LAB — D-DIMER, QUANTITATIVE: D-Dimer, Quant: 1.58 ug/mL-FEU — ABNORMAL HIGH (ref 0.00–0.50)

## 2022-10-11 LAB — TROPONIN I (HIGH SENSITIVITY)
Troponin I (High Sensitivity): 8 ng/L (ref <18)
Troponin I (High Sensitivity): 4 ng/L (ref <18)

## 2022-10-11 MED ORDER — MORPHINE SULFATE (PF) 4 MG/ML IV SOLN
4.0000 mg | Freq: Once | INTRAVENOUS | Status: AC
  Administered 2022-10-11: 4 mg via INTRAVENOUS
  Filled 2022-10-11: qty 1

## 2022-10-11 MED ORDER — ONDANSETRON HCL 4 MG/2ML IJ SOLN
4.0000 mg | Freq: Once | INTRAMUSCULAR | Status: AC
  Administered 2022-10-11: 4 mg via INTRAVENOUS
  Filled 2022-10-11: qty 2

## 2022-10-11 MED ORDER — ASPIRIN 81 MG PO CHEW
324.0000 mg | CHEWABLE_TABLET | Freq: Once | ORAL | Status: AC
  Administered 2022-10-11: 324 mg via ORAL
  Filled 2022-10-11: qty 4

## 2022-10-11 MED ORDER — IOHEXOL 350 MG/ML SOLN
75.0000 mL | Freq: Once | INTRAVENOUS | Status: AC | PRN
  Administered 2022-10-11: 75 mL via INTRAVENOUS

## 2022-10-11 NOTE — ED Notes (Signed)
Pt ambulated to the bathroom. Pt's gait is steady and is able to walk independently.  

## 2022-10-11 NOTE — ED Provider Notes (Signed)
Tracie Atkins EMERGENCY DEPARTMENT AT [facility] Provider Note   CSN: [id number] Arrival date & time: 10/11/22  1132     History  Chief Complaint  Patient presents with   Chest Pain    Tracie Atkins is a 67 y.o. female.  67 yo F with a chief complaints of chest pain.  This been going on for a few days.  Left-sided sharp worse with movement palpation twisting.  Denies trauma to the area.  Denies significant cough or congestion.  Denies history of PE or DVT denies hemoptysis unilateral lower extremity edema immobilization or hospitalization.  She does have a remote history of breast cancer.  Also has some brain mass though it is undergoing further workup.   Chest Pain      Home Medications Prior to Admission medications   Medication Sig Start Date End Date Taking? Authorizing Provider  FLUoxetine (PROZAC) 20 MG capsule Take 1 capsule (20 mg total) by mouth daily. Patient not taking: Reported on 07/31/2022 08/24/21   [name], DO  oxyCODONE (OXY IR/ROXICODONE) 5 MG immediate release tablet Take 1 tablet (5 mg total) by mouth every 6 (six) hours as needed for severe pain. Patient not taking: Reported on 07/23/2022 01/15/22   [name], MD  QUEtiapine (SEROQUEL) 200 MG tablet Take 200 mg by mouth at bedtime. Patient not taking: Reported on 07/23/2022 12/16/21   [provider]  risperiDONE (RISPERDAL) 0.5 MG tablet Take 0.5 mg by mouth 2 (two) times daily. Patient not taking: Reported on 07/23/2022 12/16/21   [provider]  topiramate (TOPAMAX) 50 MG tablet Take 1 tablet (50 mg total) by mouth 2 (two) times daily. Patient not taking: Reported on 07/23/2022 08/23/21 08/23/22  [name], DO  traZODone (DESYREL) 100 MG tablet Take 100 mg by mouth at bedtime as needed for sleep. Patient not taking: Reported on 07/23/2022 12/16/21   [provider]  zolpidem (AMBIEN) 5 MG tablet Take 5 mg by mouth at bedtime as needed for  sleep. Patient not taking: Reported on 03/11/2022 11/11/21   [provider]      Allergies    Patient has no known allergies.    Review of Systems   Review of Systems  Cardiovascular:  Positive for chest pain.    Physical Exam Updated Vital Signs BP 111/77   Pulse 93   Temp 97.9 F (36.6 C) (Oral)   Resp 20   SpO2 94%  Physical Exam Vitals and nursing note reviewed.  Constitutional:      General: She is not in acute distress.    Appearance: She is well-developed. She is not diaphoretic.  HENT:     Head: Normocephalic and atraumatic.  Eyes:     Pupils: Pupils are equal, round, and reactive to light.  Cardiovascular:     Rate and Rhythm: Normal rate and regular rhythm.     Heart sounds: No murmur heard.    No friction rub. No gallop.  Pulmonary:     Effort: Pulmonary effort is normal.     Breath sounds: No wheezing or rales.  Chest:     Chest wall: Tenderness present.     Comments: Pain with palpation of the left chest wall about The medial clavicular line about ribs 4 through 6 reproduce her discomfort Abdominal:     General: There is no distension.     Palpations: Abdomen is soft.     Tenderness: There is no abdominal tenderness.  Musculoskeletal:  General: No tenderness.     Cervical back: Normal range of motion and neck supple.  Skin:    General: Skin is warm and dry.  Neurological:     Mental Status: She is alert and oriented to person, place, and time.  Psychiatric:        Behavior: Behavior normal.     ED Results / Procedures / Treatments   Labs (all labs ordered are listed, but only abnormal results are displayed) Labs Reviewed  BASIC METABOLIC PANEL - Abnormal; Notable for the following components:      Result Value   CO2 20 (*)    Glucose, Bld 101 (*)    Calcium 8.5 (*)    All other components within normal limits  CBC WITH DIFFERENTIAL/PLATELET - Abnormal; Notable for the following components:   RDW 17.0 (*)    All other  components within normal limits  D-DIMER, QUANTITATIVE - Abnormal; Notable for the following components:   D-Dimer, Quant 1.58 (*)    All other components within normal limits  CBG MONITORING, ED  TROPONIN I (HIGH SENSITIVITY)  TROPONIN I (HIGH SENSITIVITY)    EKG EKG Interpretation  Date/Time:  Saturday October 11 2022 11:37:48 EDT Ventricular Rate:  96 PR Interval:  131 QRS Duration: 78 QT Interval:  377 QTC Calculation: 477 R Axis:   27 Text Interpretation: Sinus rhythm No significant change since last tracing Confirmed by Melene PlanFloyd, Tregan Read [phone]([id number]) on 10/11/2022 11:44:43 AM  Radiology DG Chest Portable 1 View  Result Date: 10/11/2022 CLINICAL DATA:  Chest pain EXAM: PORTABLE CHEST 1 VIEW COMPARISON:  July 27, 2022. FINDINGS: The cardiomediastinal silhouette is unchanged in contour.LEFT axillary clips. No pleural effusion. No pneumothorax. LEFT retrocardiac opacity with predominately platelike bibasilar opacities. This appears similar comparison to prior. IMPRESSION: LEFT retrocardiac opacity with predominately platelike bibasilar opacities, favored atelectasis. Differential considerations include aspiration or infection. Electronically Signed   By: Meda KlinefelterStephanie  Peacock M.D.   On: 10/11/2022 12:51    Procedures Procedures    Medications Ordered in ED Medications  aspirin chewable tablet 324 mg (324 mg Oral Given 10/11/22 1229)  morphine (PF) 4 MG/ML injection 4 mg (4 mg Intravenous Given 10/11/22 1230)  ondansetron (ZOFRAN) injection 4 mg (4 mg Intravenous Given 10/11/22 1229)    ED Course/ Medical Decision Making/ A&P                             Medical Decision Making Amount and/or Complexity of Data Reviewed Labs: ordered. Radiology: ordered.  Risk OTC drugs. Prescription drug management.   67 yo F with a chief complaint of left-sided chest discomfort.  This has been going on for couple days.  Atypical in nature and reproduced on exam.  Most likely musculoskeletal by history  and physical exam.  She does have a history of cancer and sees neuro-oncology.  Will obtain a D-dimer as I suspect she likely had a little bit higher risk though she is otherwise low risk by [place].  Pain going on greater than 48 hours without significant change in the last 6 feels a single troponin will be sufficient.  Ddimer +, trop negative x 2.  No significant anemia no significant electrolyte abnormality.  CT angiogram ordered.  Signed out to Dr. Jeraldine LootsLockwood, please see their note for further details of care in the ED.  The patients results and plan were reviewed and discussed.   Any x-rays performed were independently reviewed by myself.  Differential diagnosis were considered with the presenting HPI.  Medications  aspirin chewable tablet 324 mg (324 mg Oral Given 10/11/22 1229)  morphine (PF) 4 MG/ML injection 4 mg (4 mg Intravenous Given 10/11/22 1230)  ondansetron (ZOFRAN) injection 4 mg (4 mg Intravenous Given 10/11/22 1229)    Vitals:   10/11/22 1137 10/11/22 1300 10/11/22 1330  BP: (!) 91/56 107/74 111/77  Pulse: 94 88 93  Resp: 20 18 20   Temp: 97.9 F (36.6 C)    TempSrc: Oral    SpO2: 96% 95% 94%    Final diagnoses:  Nonspecific chest pain    Admission/ observation were discussed with the admitting physician, patient and/or family and they are comfortable with the plan.          Final Clinical Impression(s) / ED Diagnoses Final diagnoses:  Nonspecific chest pain    Rx / DC Orders ED Discharge Orders     None         Melene Plan, DO 10/11/22 1530

## 2022-10-11 NOTE — Discharge Instructions (Signed)
As discussed, today's evaluation did not show evidence for a heart attack, nor for a blood clot.  However, there is some evidence that your cancer may be involved in your back, and the area where the nerves can mimic chest pain.  It is importantly follow-up with your oncology team and/or primary care physician.  Please call on Monday for an appointment.  Return here for concerning changes in your condition.

## 2022-10-11 NOTE — ED Triage Notes (Signed)
[facility] reports pt was shopping and had central chest pain, has been going on for a few days.324mg  of ASA and 3 0.4mg  Nitro was given, 8/10 after meds.

## 2022-10-11 NOTE — ED Provider Notes (Signed)
5:00 PM Patient awake and alert, speaking clearly, hemodynamically unremarkable.  CT angiography negative for PE, but with some suspicion for T5 lytic lesion.  I reviewed her chart, sent a message to her oncology nurse, and discussed the importance of following up with the patient.  Absent hemodynamic instability, substantial ongoing discomfort, patient comfortable with outpatient follow-up and appropriate to do so.   Gerhard Munch, MD 10/11/22 [phone]

## 2022-10-12 ENCOUNTER — Emergency Department (HOSPITAL_COMMUNITY)
Admission: EM | Admit: 2022-10-12 | Discharge: 2022-10-12 | Disposition: A | Discharge status: Home / Self Care | Payer: 59 | Attending: Emergency Medicine | Admitting: Emergency Medicine

## 2022-10-12 ENCOUNTER — Emergency Department (HOSPITAL_COMMUNITY): Payer: 59

## 2022-10-12 DIAGNOSIS — R0789 Other chest pain: Secondary | ICD-10-CM | POA: Diagnosis not present

## 2022-10-12 DIAGNOSIS — R062 Wheezing: Secondary | ICD-10-CM | POA: Insufficient documentation

## 2022-10-12 DIAGNOSIS — R079 Chest pain, unspecified: Secondary | ICD-10-CM | POA: Diagnosis present

## 2022-10-12 LAB — CBC WITH DIFFERENTIAL/PLATELET
Abs Immature Granulocytes: 0.01 10*3/uL (ref 0.00–0.07)
Basophils Absolute: 0 10*3/uL (ref 0.0–0.1)
Basophils Relative: 1 %
Eosinophils Absolute: 0.1 10*3/uL (ref 0.0–0.5)
Eosinophils Relative: 1 %
HCT: 41.9 % (ref 36.0–46.0)
Hemoglobin: 13.1 g/dL (ref 12.0–15.0)
Immature Granulocytes: 0 %
Lymphocytes Relative: 18 %
Lymphs Abs: 1.1 10*3/uL (ref 0.7–4.0)
MCH: 26.6 pg (ref 26.0–34.0)
MCHC: 31.3 g/dL (ref 30.0–36.0)
MCV: 85.2 fL (ref 80.0–100.0)
Monocytes Absolute: 0.6 10*3/uL (ref 0.1–1.0)
Monocytes Relative: 10 %
Neutro Abs: 4.4 10*3/uL (ref 1.7–7.7)
Neutrophils Relative %: 70 %
Platelets: 209 10*3/uL (ref 150–400)
RBC: 4.92 MIL/uL (ref 3.87–5.11)
RDW: 17 % — ABNORMAL HIGH (ref 11.5–15.5)
WBC: 6.1 10*3/uL (ref 4.0–10.5)
nRBC: 0 % (ref 0.0–0.2)

## 2022-10-12 LAB — COMPREHENSIVE METABOLIC PANEL
ALT: 14 U/L (ref 0–44)
AST: 15 U/L (ref 15–41)
Albumin: 3.8 g/dL (ref 3.5–5.0)
Alkaline Phosphatase: 146 U/L — ABNORMAL HIGH (ref 38–126)
Anion gap: 7 (ref 5–15)
BUN: 13 mg/dL (ref 8–23)
CO2: 25 mmol/L (ref 22–32)
Calcium: 8.6 mg/dL — ABNORMAL LOW (ref 8.9–10.3)
Chloride: 109 mmol/L (ref 98–111)
Creatinine, Ser: 0.54 mg/dL (ref 0.44–1.00)
GFR, Estimated: >60 mL/min (ref >60)
Glucose, Bld: 100 mg/dL — ABNORMAL HIGH (ref 70–99)
Potassium: 3.7 mmol/L (ref 3.5–5.1)
Sodium: 141 mmol/L (ref 135–145)
Total Bilirubin: 0.5 mg/dL (ref 0.3–1.2)
Total Protein: 6.8 g/dL (ref 6.5–8.1)

## 2022-10-12 LAB — BRAIN NATRIURETIC PEPTIDE: B Natriuretic Peptide: 37 pg/mL (ref 0.0–100.0)

## 2022-10-12 LAB — TROPONIN I (HIGH SENSITIVITY): Troponin I (High Sensitivity): 5 ng/L (ref <18)

## 2022-10-12 MED ORDER — HYDROCODONE-ACETAMINOPHEN 5-325 MG PO TABS
1.0000 | ORAL_TABLET | Freq: Once | ORAL | Status: AC
  Administered 2022-10-12: 1 via ORAL
  Filled 2022-10-12: qty 1

## 2022-10-12 MED ORDER — OXYCODONE HCL 5 MG PO TABS: 5.0000 mg | ORAL_TABLET | Freq: Four times a day (QID) | ORAL | 0 refills | Status: DC | PRN

## 2022-10-12 NOTE — ED Notes (Signed)
Pt discharged home with son. A&Ox4. Ambulated without any complications. Discussed discharge information. No s/s of distress observed during discharge.

## 2022-10-12 NOTE — ED Triage Notes (Signed)
Pt arrived via POV. C/o chest pain for 2x days. Was seen at [facility] yesterday for same. Oncology follow up was recommended.  AOx4

## 2022-10-12 NOTE — Discharge Instructions (Addendum)
Note the visit to the emergency department today was overall reassuring.  As discussed, we will send in medication to take as needed for pain.  Continue to take Tylenol for baseline pain and use this medication for breakthrough pain.  Continue use incentive spirometry as directed by respiratory therapy. Please do not hesitate to return to emergency department if there is acute worsening of your symptoms.  Keep upcoming appoint with oncology for reassessment.

## 2022-10-12 NOTE — ED Provider Notes (Signed)
[place] EMERGENCY DEPARTMENT AT [facility] Provider Note   CSN: [id number] Arrival date & time: 10/12/22  0518     History  Chief Complaint  Patient presents with   Chest Pain    Tracie Atkins is a 67 y.o. female.   Chest Pain   67 year old female presents emergency department with complaints of chest pain.  Patient states the pain has been constant since Friday.  Was seen at [facility] emergency department yesterday with reassuring workup from acute cardiac standpoint but was found with metastatic disease to T5.  Patient states that she was sent home without any medication and pain persisted prompting visit to the emergency department.  Denies any acute change in symptoms.  Patient states that she has had cough for the past 2 to 3 years that remains unchanged.  States that cough, taking a deep breath worsens symptoms as well as laying on her chest.  Denies fever, chills, night sweats, abdominal pain, nausea, vomiting, urinary symptoms, change in bowel habits.  Past medical history significant for metastatic breast cancer, depression, pituitary mass, major depressive disorder, alcohol abuse  Home Medications Prior to Admission medications   Medication Sig Start Date End Date Taking? Authorizing Provider  oxyCODONE (ROXICODONE) 5 MG immediate release tablet Take 1 tablet (5 mg total) by mouth every 6 (six) hours as needed for severe pain or breakthrough pain. 10/12/22  Yes Sherian Maroon A, PA  topiramate (TOPAMAX) 50 MG tablet Take 1 tablet (50 mg total) by mouth 2 (two) times daily. Patient not taking: Reported on 07/23/2022 08/23/21 08/23/22  [name], DO      Allergies    Patient has no known allergies.    Review of Systems   Review of Systems  Cardiovascular:  Positive for chest pain.  All other systems reviewed and are negative.   Physical Exam Updated Vital Signs BP (!) 133/91 (BP Location: Left Arm)   Pulse 92   Temp 97.9 F (36.6 C)  (Oral)   Resp 19   SpO2 96%  Physical Exam Vitals and nursing note reviewed.  Constitutional:      General: She is not in acute distress.    Appearance: She is well-developed.  HENT:     Head: Normocephalic and atraumatic.  Eyes:     Conjunctiva/sclera: Conjunctivae normal.  Cardiovascular:     Rate and Rhythm: Normal rate and regular rhythm.  Pulmonary:     Effort: Pulmonary effort is normal. No respiratory distress.     Breath sounds: Wheezing and rhonchi present.     Comments: Mild wheezes rhonchi auscultated bilateral lower lung fields. Chest:     Chest wall: Tenderness present.     Comments: Left anterior chest wall tenderness. Abdominal:     Palpations: Abdomen is soft.     Tenderness: There is no abdominal tenderness.  Musculoskeletal:        General: No swelling.     Cervical back: Neck supple.     Right lower leg: No edema.     Left lower leg: No edema.  Skin:    General: Skin is warm and dry.     Capillary Refill: Capillary refill takes less than 2 seconds.  Neurological:     Mental Status: She is alert.  Psychiatric:        Mood and Affect: Mood normal.     ED Results / Procedures / Treatments   Labs (all labs ordered are listed, but only abnormal results are displayed)  Labs Reviewed  COMPREHENSIVE METABOLIC PANEL - Abnormal; Notable for the following components:      Result Value   Glucose, Bld 100 (*)    Calcium 8.6 (*)    Alkaline Phosphatase 146 (*)    All other components within normal limits  CBC WITH DIFFERENTIAL/PLATELET - Abnormal; Notable for the following components:   RDW 17.0 (*)    All other components within normal limits  BRAIN NATRIURETIC PEPTIDE  TROPONIN I (HIGH SENSITIVITY)  TROPONIN I (HIGH SENSITIVITY)    EKG None  Radiology DG Chest Port 1 View  Result Date: 10/12/2022 CLINICAL DATA:  Chest pain and tightness and shortness of breath for the past 2 days. EXAM: PORTABLE CHEST 1 VIEW COMPARISON:  10/11/2022 FINDINGS:  Enlarged cardiac silhouette with an interval increase in size. Interval mild prominence of the pulmonary vasculature. Stable chronic interstitial prominence. No Kerley lines or definite pleural fluid. Bilateral axillary surgical clips. IMPRESSION: 1. Interval cardiomegaly and mild pulmonary vascular congestion. 2. Stable chronic interstitial lung disease. Electronically Signed   By: Beckie SaltsSteven  Reid M.D.   On: 10/12/2022 12:57   CT Angio Chest PE W and/or Wo Contrast  Result Date: 10/11/2022 CLINICAL DATA:  Pulmonary embolus suspected EXAM: CT ANGIOGRAPHY CHEST WITH CONTRAST TECHNIQUE: Multidetector CT imaging of the chest was performed using the standard protocol during bolus administration of intravenous contrast. Multiplanar CT image reconstructions and MIPs were obtained to evaluate the vascular anatomy. RADIATION DOSE REDUCTION: This exam was performed according to the departmental dose-optimization program which includes automated exposure control, adjustment of the mA and/or kV according to patient size and/or use of iterative reconstruction technique. CONTRAST:  75mL OMNIPAQUE IOHEXOL 350 MG/ML SOLN COMPARISON:  None Available. FINDINGS: Cardiovascular: No evidence of pulmonary embolus. Normal heart size. No pericardial effusion. Ascending thoracic aorta is upper limits of normal in size, measuring up to 3.9 cm. Mild atherosclerotic disease of the ascending thoracic aorta. Mediastinum/Nodes: Moderate hiatal hernia. Thyroid is unremarkable. No enlarged lymph nodes seen in the chest. Lungs/Pleura: Central airways are patent. Elevation of the right hemidiaphragm. Right-greater-than-left lower lung predominant linear opacities which are likely due to scarring or atelectasis. No pleural effusion or pneumothorax. Upper Abdomen: No acute abnormality. Musculoskeletal: Postsurgical changes of the right breast. Lytic and sclerotic lesion of the T5 vertebral body. Review of the MIP images confirms the above findings.  IMPRESSION: 1. No evidence of pulmonary embolus. 2. Elevation of the right hemidiaphragm. Right-greater-than-left lower lung predominant linear opacities which are likely due to scarring or atelectasis. 3. Lytic and sclerotic lesion of the T5 vertebral body, highly concerning for osseous metastatic disease. 4. Moderate hiatal hernia. 5. Aortic Atherosclerosis (ICD10-I70.0). Electronically Signed   By: Allegra LaiLeah  Strickland M.D.   On: 10/11/2022 16:10   DG Chest Portable 1 View  Result Date: 10/11/2022 CLINICAL DATA:  Chest pain EXAM: PORTABLE CHEST 1 VIEW COMPARISON:  July 27, 2022. FINDINGS: The cardiomediastinal silhouette is unchanged in contour.LEFT axillary clips. No pleural effusion. No pneumothorax. LEFT retrocardiac opacity with predominately platelike bibasilar opacities. This appears similar comparison to prior. IMPRESSION: LEFT retrocardiac opacity with predominately platelike bibasilar opacities, favored atelectasis. Differential considerations include aspiration or infection. Electronically Signed   By: Meda KlinefelterStephanie  Peacock M.D.   On: 10/11/2022 12:51    Procedures Procedures    Medications Ordered in ED Medications  HYDROcodone-acetaminophen (NORCO/VICODIN) 5-325 MG per tablet 1 tablet (1 tablet Oral Given 10/12/22 1305)    ED Course/ Medical Decision Making/ A&P  Medical Decision Making Amount and/or Complexity of Data Reviewed Labs: ordered. Radiology: ordered.  Risk Prescription drug management.   This patient presents to the ED for concern of chest pain, this involves an extensive number of treatment options, and is a complaint that carries with it a high risk of complications and morbidity.  The differential diagnosis includes ACS, PE, pneumothorax, malignancy, fracture, dislocation, strain/sprain, aortic dissection, dissection   Co morbidities that complicate the patient evaluation  See HPI   Additional history obtained:  Additional  history obtained from EMR External records from outside source obtained and reviewed including hospital records   Lab Tests:  I Ordered, and personally interpreted labs.  The pertinent results include: No leukocytosis noted.  No evidence anemia.  Platelets within normal range.  Mild hypocalcemia of 8.6 but otherwise, electrolytes within normal range.  Alkaline phosphatase elevated at 146 most likely secondary to patient's metastatic cancer.  No transaminitis.  No renal dysfunction.  BNP within normal limits.  Troponin of 5; no acute ischemic changes appreciated on EKG compared to prior EKG performed.   Imaging Studies ordered:  I ordered imaging studies including chest x-ray I independently visualized and interpreted imaging which showed only with mild pulmonary vascular congestion.  Chronic interstitial lung changes. I agree with the radiologist interpretation   Cardiac Monitoring: / EKG:  The patient was maintained on a cardiac monitor.  I personally viewed and interpreted the cardiac monitored which showed an underlying rhythm of: Sinus rhythm without acute ischemic changes or prior EKG performed.   Consultations Obtained:  N/a   Problem List / ED Course / Critical interventions / Medication management  Atypical chest pain I ordered medication including hydrocodone   Reevaluation of the patient after these medicines showed that the patient improved I have reviewed the patients home medicines and have made adjustments as needed   Social Determinants of Health:  Denies tobacco, illicit drug use   Test / Admission - Considered:  Atypical chest pain Vitals signs significant for initial tachypnea with respiratory rate of 22 oh which decreased to within normal range with administration of medicines and time elapsed on the emergency department. Otherwise within normal range and stable throughout visit. Laboratory/imaging studies significant for: See above 67 year old female  presents emergency department with complaints of continued chest pain.  Patient states that she had chest pain after being discharged from the hospital yesterday after overall negative workup.  Patient with delta negative troponin yesterday with initial troponin negative today; no acute ischemic changes so doubt ACS.  Patient with negative CT PE study yesterday for PE or obvious aortic dissection.  Patient's pain seems more muscle skeletal given reproducibility on exam with new metastatic disease appreciated T5 vertebrae from CT PE study yesterday.  Patient without volume overload, BNP within normal limits but with mild pulmonary vascular congestion appreciated on chest x-ray; patient declined administration of Lasix at home; will recommend follow-up with cardiology regarding pulmonary vascular congestion.  Patient without fever, cough, shortness of breath, hypoxia.  Will prescribe pain medication and recommend close follow-up with oncology for further assessment/therapy regarding patient's new metastatic lesion.  Patient also given incentive spirometer while in the emergency department due to pain experience with taking a deep breath and initial appearance of shallow breathing.  Further workup deemed unnecessary at this time while in the emergency department.  Treatment plan discussed at length with patient and she acknowledged understanding was agreeable to said plan.  Patient overall well-appearing, not hypoxic, in no acute respiratory distress,  not tachypneic, afebrile, without shob.  Worrisome signs and symptoms were discussed with the patient, and the patient acknowledged understanding to return to the ED if noticed. Patient was stable upon discharge.          Final Clinical Impression(s) / ED Diagnoses Final diagnoses:  Chest wall pain    Rx / DC Orders ED Discharge Orders          Ordered    oxyCODONE (ROXICODONE) 5 MG immediate release tablet  Every 6 hours PRN        10/12/22 1437               Peter Garter, [place] 10/12/22 1739    Bethann Berkshire, MD 10/14/22 1547

## 2022-10-14 ENCOUNTER — Other Ambulatory Visit: Payer: Self-pay | Admitting: Radiation Oncology

## 2022-10-14 DIAGNOSIS — C7951 Secondary malignant neoplasm of bone: Secondary | ICD-10-CM

## 2022-10-14 DIAGNOSIS — Z170 Estrogen receptor positive status [ER+]: Secondary | ICD-10-CM

## 2022-10-14 NOTE — Progress Notes (Signed)
Tracie Atkins is a 67 y.o. patient with a history of  Stage IIA, pT2N1M0, grade 2, ER/PR positive, invasive ductal carcinoma of the left breast. She was treated with left lumpectomy and sentinel node biopsy on 01/16/23. She did not receive chemotherapy due to low oncotype dx score, but did not begin her antiestrogen therapy or complete the adjuvant radiation we recommended though she did receive 9 of the planned 33 fractions in October 2023. She contacted [place] about restarting treatment in January of this year, she underwent simulation which was delayed on multiple occasions despite the fact that we had coordinated transportation for her.  She did come back for simulation but no showed all of the appointments we had scheduled for treatment.  She presented over the weekend to the emergency department at [facility], complaining of chest pain and a CT scan with angiography was performed on 10/11/2022 which showed no evidence of embolism, right greater than left lower lung linear opacities concerning for scarring or atelectasis and what appeared to be a lytic and sclerotic lesion of the T5 vertebral body concerning for metastatic disease.  She was discharged home and will be contacted by our office to follow-up in the clinic.  We will be ordering a PET scan for further clarification of her extent of disease and have messaged medical oncology about her case as well.    Osker Mason, PAC

## 2022-10-15 ENCOUNTER — Telehealth: Payer: Self-pay | Admitting: *Deleted

## 2022-10-15 NOTE — Telephone Encounter (Signed)
Called patient to inform of Pet Scan 10/30/22, spoke with patient and she is aware of this scan

## 2022-10-16 ENCOUNTER — Ambulatory Visit
Admission: RE | Admit: 2022-10-16 | Discharge: 2022-10-16 | Disposition: A | Discharge status: Home / Self Care | Payer: 59 | Source: Ambulatory Visit | Attending: Radiation Oncology | Admitting: Radiation Oncology

## 2022-10-16 ENCOUNTER — Encounter: Payer: Self-pay | Admitting: Radiation Oncology

## 2022-10-16 VITALS — BP 116/73 | HR 98 | Temp 97.9°F | Resp 20 | Ht 64.0 in | Wt 191.2 lb

## 2022-10-16 DIAGNOSIS — D497 Neoplasm of unspecified behavior of endocrine glands and other parts of nervous system: Secondary | ICD-10-CM

## 2022-10-16 DIAGNOSIS — Z170 Estrogen receptor positive status [ER+]: Secondary | ICD-10-CM

## 2022-10-16 DIAGNOSIS — C7951 Secondary malignant neoplasm of bone: Secondary | ICD-10-CM

## 2022-10-16 MED ORDER — POLYETHYLENE GLYCOL 3350 17 G PO PACK: 17.0000 g | PACK | Freq: Every day | ORAL | 1 refills | Status: DC

## 2022-10-16 MED ORDER — OXYCODONE HCL 5 MG PO TABS: 5.0000 mg | ORAL_TABLET | ORAL | 0 refills | Status: DC | PRN

## 2022-10-16 NOTE — Progress Notes (Signed)
Radiation Oncology         ([phone]) [phone] ________________________________   Outpatient Re-Consultation   ________________________________  Name: Tracie Atkins        MRN: [id number]  Date of Service: 10/16/2022 DOB: 09/04/1955  JY:[id number]:Patient, No Pcp Per  Almond LintByerly, Faera, MD     REFERRING PHYSICIAN: Almond LintByerly, Faera, MD   DIAGNOSIS: The encounter diagnosis was Malignant neoplasm of upper-outer quadrant of left breast in female, estrogen receptor positive.   HISTORY OF PRESENT ILLNESS: Tracie Atkins is a 67 y.o. female with a history of  Stage IIA, pT2N1M0, grade 2, ER/PR positive, invasive ductal carcinoma of the left breast. She was treated with left lumpectomy and sentinel node biopsy on 01/16/23. She did not receive chemotherapy due to low oncotype dx score, but did not begin her antiestrogen therapy or complete the adjuvant radiation we recommended though she did receive 9 of the planned 33 fractions in October 2023. She contacted us about restarting treatment in January of this year, she underwent simulation which was delayed on multiple occasions despite the fact that we had coordinated transportation for her.  She did come back for simulation but no showed all of the appointments we had scheduled for treatment.  She presented over the weekend to the emergency department at [facility], complaining of chest pain and a CT scan with angiography was performed on 10/11/2022 which showed no evidence of embolism, right greater than left lower lung linear opacities concerning for scarring or atelectasis and what appeared to be a lytic and sclerotic lesion of the T5 vertebral body concerning for metastatic disease.   She is seen today to discuss treatment recommendations and has a PET scan scheduled for 10/20/22.   PREVIOUS RADIATION THERAPY:  February 2024: while we offered re-planning to finish out the remaining therapy she did not complete in 2023, she no showed for her therapy appointments. Our intention  was to give  50.4 Gy in 28 fractions to the breast and SCLV region using a 4-field approach with a 3-D conformal technique.     03/26/22-04/11/22  The patient initially was to receive a dose of 50.4 Gy in 28 fractions followed by a 10 Gy boost in 5 fractions to the left breast and regional nodes. She relocated to [place] in the midst of her therapy, and only received 9 fractions totaling 16.2 Gy. This was delivered using a 3-D conformal technique.    PAST MEDICAL HISTORY:  Past Medical History:  Diagnosis Date   Breast cancer 2023   left   Chronic dental infection 8/189   multiple teeth removed and Post -op infection.   Depression    ETOH abuse    Fatty liver    Headache        PAST SURGICAL HISTORY: Past Surgical History:  Procedure Laterality Date   BREAST LUMPECTOMY WITH RADIOACTIVE SEED AND SENTINEL LYMPH NODE BIOPSY Left 01/15/2022   Procedure: LEFT BREAST SEED LOCALIZED LUMPECTOMY, LEFT SEED TARGETED AXILLARY LYMPH NODE BIOPSY, LEFT SENTINEL LYMPH NODE BIOPSY;  Surgeon: Almond LintByerly, Faera, MD;  Location: [facility] OR;  Service: General;  Laterality: Left;   DENTAL SURGERY     RADIOACTIVE SEED GUIDED EXCISIONAL BREAST BIOPSY Right 01/15/2022   Procedure: RIGHT BREAST SEED LOCALIZED EXCISIONAL BIOPSY;  Surgeon: Almond LintByerly, Faera, MD;  Location: [facility] OR;  Service: General;  Laterality: Right;   TUBAL LIGATION       FAMILY HISTORY: History reviewed. No pertinent family history.   SOCIAL HISTORY:  reports that she  has never smoked. She has never been exposed to tobacco smoke. She has never used smokeless tobacco. She reports current alcohol use of about 1.0 standard drink of alcohol per week. She reports that she does not use drugs. The patient is single and lives in [place]. She's originally from [place].  She's accompanied by her son.   ALLERGIES: Patient has no known allergies.   MEDICATIONS:  Current Outpatient Medications  Medication Sig Dispense Refill   polyethylene glycol  (MIRALAX) 17 g packet Take 17 g by mouth daily. 30 each 1   topiramate (TOPAMAX) 50 MG tablet Take 50 mg by mouth 2 (two) times daily.     oxyCODONE (ROXICODONE) 5 MG immediate release tablet Take 1 tablet (5 mg total) by mouth every 4 (four) hours as needed for severe pain or breakthrough pain. 60 tablet 0   No current facility-administered medications for this encounter.     REVIEW OF SYSTEMS: Review of systems the patient reports that she is having terrible discomfort in her upper back and this radiates into the back of her chest wall bilaterally.  She denies any loss of sensation, strength or control of bowel or bladder activity.  She is not having much pain medication as she was only given 3 tablets of oxycodone when she left the hospital.  She states that this did seem to like very mild improvement in her discomfort but did not relieve it completely.  She is concerned about constipation that she is sensitive to medication.  No other complaints  PHYSICAL EXAM:  Wt Readings from Last 3 Encounters:  10/16/22 191 lb 3.2 oz (86.7 kg)  07/31/22 192 lb 11.2 oz (87.4 kg)  07/27/22 140 lb (63.5 kg)   Temp Readings from Last 3 Encounters:  10/16/22 97.9 F (36.6 C) (Temporal)  10/12/22 97.9 F (36.6 C) (Oral)  10/11/22 97.7 F (36.5 C) (Oral)   BP Readings from Last 3 Encounters:  10/16/22 116/73  10/12/22 (!) 133/91  10/11/22 113/83   Pulse Readings from Last 3 Encounters:  10/16/22 98  10/12/22 92  10/11/22 91   In general this is a chronically ill appearing Caucasian female in no acute distress. She's alert and oriented x4 and appropriate throughout the examination. Cardiopulmonary assessment is negative for acute distress and she exhibits normal effort.      ECOG = 1  0 - Asymptomatic (Fully active, able to carry on all predisease activities without restriction)  1 - Symptomatic but completely ambulatory (Restricted in physically strenuous activity but ambulatory and able  to carry out work of a light or sedentary nature. For example, light housework, office work)  2 - Symptomatic, <50% in bed during the day (Ambulatory and capable of all self care but unable to carry out any work activities. Up and about more than 50% of waking hours)  3 - Symptomatic, >50% in bed, but not bedbound (Capable of only limited self-care, confined to bed or chair 50% or more of waking hours)  4 - Bedbound (Completely disabled. Cannot carry on any self-care. Totally confined to bed or chair)  5 - Death   Santiago Glad MM, Creech RH, Tormey DC, et al. [phone]). "Toxicity and response criteria of the [facility] Group". Am. Evlyn Clines. Oncol. 5 (6): 649-55    LABORATORY DATA:  Lab Results  Component Value Date   WBC 6.1 10/12/2022   HGB 13.1 10/12/2022   HCT 41.9 10/12/2022   MCV 85.2 10/12/2022   PLT 209 10/12/2022  Lab Results  Component Value Date   NA 141 10/12/2022   K 3.7 10/12/2022   CL 109 10/12/2022   CO2 25 10/12/2022   Lab Results  Component Value Date   ALT 14 10/12/2022   AST 15 10/12/2022   ALKPHOS 146 (H) 10/12/2022   BILITOT 0.5 10/12/2022      RADIOGRAPHY: DG Chest Port 1 View  Result Date: 10/12/2022 CLINICAL DATA:  Chest pain and tightness and shortness of breath for the past 2 days. EXAM: PORTABLE CHEST 1 VIEW COMPARISON:  10/11/2022 FINDINGS: Enlarged cardiac silhouette with an interval increase in size. Interval mild prominence of the pulmonary vasculature. Stable chronic interstitial prominence. No Kerley lines or definite pleural fluid. Bilateral axillary surgical clips. IMPRESSION: 1. Interval cardiomegaly and mild pulmonary vascular congestion. 2. Stable chronic interstitial lung disease. Electronically Signed   By: Beckie Salts M.D.   On: 10/12/2022 12:57   CT Angio Chest PE W and/or Wo Contrast  Result Date: 10/11/2022 CLINICAL DATA:  Pulmonary embolus suspected EXAM: CT ANGIOGRAPHY CHEST WITH CONTRAST TECHNIQUE: Multidetector CT  imaging of the chest was performed using the standard protocol during bolus administration of intravenous contrast. Multiplanar CT image reconstructions and MIPs were obtained to evaluate the vascular anatomy. RADIATION DOSE REDUCTION: This exam was performed according to the departmental dose-optimization program which includes automated exposure control, adjustment of the mA and/or kV according to patient size and/or use of iterative reconstruction technique. CONTRAST:  75mL OMNIPAQUE IOHEXOL 350 MG/ML SOLN COMPARISON:  None Available. FINDINGS: Cardiovascular: No evidence of pulmonary embolus. Normal heart size. No pericardial effusion. Ascending thoracic aorta is upper limits of normal in size, measuring up to 3.9 cm. Mild atherosclerotic disease of the ascending thoracic aorta. Mediastinum/Nodes: Moderate hiatal hernia. Thyroid is unremarkable. No enlarged lymph nodes seen in the chest. Lungs/Pleura: Central airways are patent. Elevation of the right hemidiaphragm. Right-greater-than-left lower lung predominant linear opacities which are likely due to scarring or atelectasis. No pleural effusion or pneumothorax. Upper Abdomen: No acute abnormality. Musculoskeletal: Postsurgical changes of the right breast. Lytic and sclerotic lesion of the T5 vertebral body. Review of the MIP images confirms the above findings. IMPRESSION: 1. No evidence of pulmonary embolus. 2. Elevation of the right hemidiaphragm. Right-greater-than-left lower lung predominant linear opacities which are likely due to scarring or atelectasis. 3. Lytic and sclerotic lesion of the T5 vertebral body, highly concerning for osseous metastatic disease. 4. Moderate hiatal hernia. 5. Aortic Atherosclerosis (ICD10-I70.0). Electronically Signed   By: Allegra Lai M.D.   On: 10/11/2022 16:10   DG Chest Portable 1 View  Result Date: 10/11/2022 CLINICAL DATA:  Chest pain EXAM: PORTABLE CHEST 1 VIEW COMPARISON:  July 27, 2022. FINDINGS: The  cardiomediastinal silhouette is unchanged in contour.LEFT axillary clips. No pleural effusion. No pneumothorax. LEFT retrocardiac opacity with predominately platelike bibasilar opacities. This appears similar comparison to prior. IMPRESSION: LEFT retrocardiac opacity with predominately platelike bibasilar opacities, favored atelectasis. Differential considerations include aspiration or infection. Electronically Signed   By: Meda Klinefelter M.D.   On: 10/11/2022 12:51       IMPRESSION/PLAN: 1. Progressive Metastatic Stage IIA, pT2N1M0, grade 2, ER/PR invasive ductal carcinoma of the left breast now involving the T5 vertebral body. Dr. Mitzi Hansen discusses the patient's course to date and concerns regarding the fact that she did not complete prior therapy, and appears to have metastatic disease in her spine.  He recommends proceeding with a PET scan, and this has been able to be pushed up sooner than initially  planned.  We will follow-up with these results and wants to have clarity on her burden of disease, will offer palliative radiotherapy at minimum to the T5 vertebral body.  We discussed the risks, benefits, short, and long term effects of radiotherapy, as well as the palliative intent, and the patient is interested in proceeding. Dr. Mitzi Hansen discusses the delivery and logistics of radiotherapy and anticipates a course of up to 2 weeks of radiotherapy.  We will follow-up with her PET imaging, and have her set up for simulation today after.  Dr. Al Pimple will plan to meet back with her after her PET scan as well. 2. Pain secondary to #1.  The prescription was sent into her pharmacy for oxycodone to be taken 1 tablet every 4 hours to see if this changes also sent in a prescription for MiraLAX for her to use as well. 3. 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  She will continue to follow-up with Dr. Barbaraann Cao 22 for her next MRI in May 2024.  In a visit lasting 45 minutes, greater than 50% of the time was spent face to face  discussing the patient's condition, in preparation for the discussion, and coordinating the patient's care.    The above documentation reflects my direct findings during this shared patient visit. Please see the separate note by Dr. Mitzi Hansen on this date for the remainder of the patient's plan of care.     Osker Mason, [facility]     **Disclaimer: This note was dictated with voice recognition software. Similar sounding words can inadvertently be transcribed and this note may contain transcription errors which may not have been corrected upon publication of note.**

## 2022-10-16 NOTE — Progress Notes (Signed)
Nursing interview for Malignant neoplasm of upper-outer quadrant of left breast in female, estrogen receptor positive. Patient identity verified.   Patient reports LT sided chest/ breast pain 10/10 and fatigue. No other issues conveyed at this time.  Meaningful use complete. Postmenopausal  Vitals- BP 116/73 (BP Location: Right Arm, Patient Position: Sitting, Cuff Size: Large)   Pulse 98   Temp 97.9 F (36.6 C) (Temporal)   Resp 20   Ht 5\' 4"  (1.626 m)   Wt 191 lb 3.2 oz (86.7 kg)   SpO2 95%   BMI 32.82 kg/m    This concludes the interview.   Ruel Favors, LPN

## 2022-10-16 NOTE — Addendum Note (Signed)
Encounter addended by: Ronny Bacon, PA-C on: 10/16/2022 3:37 PM  Actions taken: Pend clinical note

## 2022-10-17 ENCOUNTER — Telehealth: Payer: Self-pay | Admitting: Hematology and Oncology

## 2022-10-17 NOTE — Telephone Encounter (Signed)
Patient called to verify time and date of appointments coming up.

## 2022-10-17 NOTE — Telephone Encounter (Signed)
Spoke with patient confirming upcoming appointments  

## 2022-10-17 NOTE — Addendum Note (Signed)
Encounter addended by: Ronny Bacon, PA-C on: 10/17/2022 9:13 AM  Actions taken: In Basket message sent, Level of Service modified, Clinical Note Signed

## 2022-10-20 ENCOUNTER — Encounter (HOSPITAL_COMMUNITY)
Admission: RE | Admit: 2022-10-20 | Discharge: 2022-10-20 | Disposition: A | Discharge status: Home / Self Care | Payer: 59 | Source: Ambulatory Visit | Attending: Radiation Oncology | Admitting: Radiation Oncology

## 2022-10-20 DIAGNOSIS — Z170 Estrogen receptor positive status [ER+]: Secondary | ICD-10-CM | POA: Insufficient documentation

## 2022-10-20 DIAGNOSIS — C50412 Malignant neoplasm of upper-outer quadrant of left female breast: Secondary | ICD-10-CM | POA: Diagnosis present

## 2022-10-20 DIAGNOSIS — C7951 Secondary malignant neoplasm of bone: Secondary | ICD-10-CM | POA: Diagnosis present

## 2022-10-20 LAB — GLUCOSE, CAPILLARY: Glucose-Capillary: 100 mg/dL — ABNORMAL HIGH (ref 70–99)

## 2022-10-20 MED ORDER — FLUDEOXYGLUCOSE F - 18 (FDG) INJECTION
10.3300 | Freq: Once | INTRAVENOUS | Status: AC | PRN
  Administered 2022-10-20: 10.33 via INTRAVENOUS

## 2022-10-21 ENCOUNTER — Ambulatory Visit
Admission: RE | Admit: 2022-10-21 | Discharge: 2022-10-21 | Disposition: A | Discharge status: Home / Self Care | Payer: 59 | Source: Ambulatory Visit | Attending: Radiation Oncology | Admitting: Radiation Oncology

## 2022-10-21 DIAGNOSIS — Z510 Encounter for antineoplastic radiation therapy: Secondary | ICD-10-CM | POA: Diagnosis not present

## 2022-10-21 DIAGNOSIS — C50412 Malignant neoplasm of upper-outer quadrant of left female breast: Secondary | ICD-10-CM | POA: Diagnosis present

## 2022-10-21 NOTE — Progress Notes (Signed)
Pt was counseled on PET imaging results and Dr. Joellen Jersey recommendation for palliative XRT to T5-7 in addition to her sternum. We will get her in to see Dr. Al Pimple as well to discuss systemic treatment.

## 2022-10-22 DIAGNOSIS — Z510 Encounter for antineoplastic radiation therapy: Secondary | ICD-10-CM | POA: Diagnosis not present

## 2022-10-23 ENCOUNTER — Ambulatory Visit
Admission: RE | Admit: 2022-10-23 | Discharge: 2022-10-23 | Disposition: A | Discharge status: Home / Self Care | Payer: 59 | Source: Ambulatory Visit | Attending: Radiation Oncology | Admitting: Radiation Oncology

## 2022-10-23 ENCOUNTER — Other Ambulatory Visit: Payer: Self-pay

## 2022-10-23 ENCOUNTER — Ambulatory Visit: Payer: 59

## 2022-10-23 ENCOUNTER — Ambulatory Visit: Payer: 59 | Admitting: Radiation Oncology

## 2022-10-23 DIAGNOSIS — Z510 Encounter for antineoplastic radiation therapy: Secondary | ICD-10-CM | POA: Diagnosis not present

## 2022-10-23 LAB — RAD ONC ARIA SESSION SUMMARY
Course Elapsed Days: 0
Plan Fractions Treated to Date: 1
Plan Prescribed Dose Per Fraction: 3 Gy
Plan Total Fractions Prescribed: 10
Plan Total Prescribed Dose: 30 Gy
Reference Point Dosage Given to Date: 3 Gy
Reference Point Session Dosage Given: 3 Gy
Session Number: 1

## 2022-10-24 ENCOUNTER — Ambulatory Visit
Admission: RE | Admit: 2022-10-24 | Discharge: 2022-10-24 | Disposition: A | Discharge status: Home / Self Care | Payer: 59 | Source: Ambulatory Visit | Attending: Radiation Oncology | Admitting: Radiation Oncology

## 2022-10-24 ENCOUNTER — Other Ambulatory Visit: Payer: Self-pay

## 2022-10-24 ENCOUNTER — Ambulatory Visit: Payer: 59

## 2022-10-24 DIAGNOSIS — Z510 Encounter for antineoplastic radiation therapy: Secondary | ICD-10-CM | POA: Diagnosis not present

## 2022-10-24 LAB — RAD ONC ARIA SESSION SUMMARY
Course Elapsed Days: 1
Plan Fractions Treated to Date: 2
Plan Prescribed Dose Per Fraction: 3 Gy
Plan Total Fractions Prescribed: 10
Plan Total Prescribed Dose: 30 Gy
Reference Point Dosage Given to Date: 6 Gy
Reference Point Session Dosage Given: 3 Gy
Session Number: 2

## 2022-10-27 ENCOUNTER — Other Ambulatory Visit: Payer: Self-pay

## 2022-10-27 ENCOUNTER — Ambulatory Visit
Admission: RE | Admit: 2022-10-27 | Discharge: 2022-10-27 | Disposition: A | Discharge status: Home / Self Care | Payer: 59 | Source: Ambulatory Visit | Attending: Radiation Oncology | Admitting: Radiation Oncology

## 2022-10-27 ENCOUNTER — Ambulatory Visit: Payer: 59

## 2022-10-27 DIAGNOSIS — Z510 Encounter for antineoplastic radiation therapy: Secondary | ICD-10-CM | POA: Diagnosis not present

## 2022-10-27 LAB — RAD ONC ARIA SESSION SUMMARY
Course Elapsed Days: 4
Plan Fractions Treated to Date: 3
Plan Prescribed Dose Per Fraction: 3 Gy
Plan Total Fractions Prescribed: 10
Plan Total Prescribed Dose: 30 Gy
Reference Point Dosage Given to Date: 9 Gy
Reference Point Session Dosage Given: 3 Gy
Session Number: 3

## 2022-10-28 ENCOUNTER — Ambulatory Visit: Payer: 59

## 2022-10-28 ENCOUNTER — Ambulatory Visit
Admission: RE | Admit: 2022-10-28 | Discharge: 2022-10-28 | Disposition: A | Discharge status: Home / Self Care | Payer: 59 | Source: Ambulatory Visit | Attending: Radiation Oncology | Admitting: Radiation Oncology

## 2022-10-28 ENCOUNTER — Other Ambulatory Visit: Payer: Self-pay

## 2022-10-28 DIAGNOSIS — Z510 Encounter for antineoplastic radiation therapy: Secondary | ICD-10-CM | POA: Diagnosis not present

## 2022-10-28 LAB — RAD ONC ARIA SESSION SUMMARY
Course Elapsed Days: 5
Plan Fractions Treated to Date: 4
Plan Prescribed Dose Per Fraction: 3 Gy
Plan Total Fractions Prescribed: 10
Plan Total Prescribed Dose: 30 Gy
Reference Point Dosage Given to Date: 12 Gy
Reference Point Session Dosage Given: 3 Gy
Session Number: 4

## 2022-10-29 ENCOUNTER — Ambulatory Visit: Payer: 59

## 2022-10-29 ENCOUNTER — Other Ambulatory Visit: Payer: Self-pay

## 2022-10-29 ENCOUNTER — Ambulatory Visit
Admission: RE | Admit: 2022-10-29 | Discharge: 2022-10-29 | Disposition: A | Discharge status: Home / Self Care | Payer: 59 | Source: Ambulatory Visit | Attending: Radiation Oncology | Admitting: Radiation Oncology

## 2022-10-29 DIAGNOSIS — Z510 Encounter for antineoplastic radiation therapy: Secondary | ICD-10-CM | POA: Diagnosis not present

## 2022-10-29 LAB — RAD ONC ARIA SESSION SUMMARY
Course Elapsed Days: 6
Plan Fractions Treated to Date: 5
Plan Prescribed Dose Per Fraction: 3 Gy
Plan Total Fractions Prescribed: 10
Plan Total Prescribed Dose: 30 Gy
Reference Point Dosage Given to Date: 15 Gy
Reference Point Session Dosage Given: 3 Gy
Session Number: 5

## 2022-10-30 ENCOUNTER — Ambulatory Visit (HOSPITAL_COMMUNITY): Payer: 59

## 2022-10-30 ENCOUNTER — Other Ambulatory Visit: Payer: Self-pay

## 2022-10-30 ENCOUNTER — Ambulatory Visit
Admission: RE | Admit: 2022-10-30 | Discharge: 2022-10-30 | Disposition: A | Discharge status: Home / Self Care | Payer: 59 | Source: Ambulatory Visit | Attending: Radiation Oncology | Admitting: Radiation Oncology

## 2022-10-30 ENCOUNTER — Ambulatory Visit: Payer: 59

## 2022-10-30 DIAGNOSIS — Z510 Encounter for antineoplastic radiation therapy: Secondary | ICD-10-CM | POA: Diagnosis not present

## 2022-10-30 LAB — RAD ONC ARIA SESSION SUMMARY
Course Elapsed Days: 7
Plan Fractions Treated to Date: 6
Plan Prescribed Dose Per Fraction: 3 Gy
Plan Total Fractions Prescribed: 10
Plan Total Prescribed Dose: 30 Gy
Reference Point Dosage Given to Date: 18 Gy
Reference Point Session Dosage Given: 3 Gy
Session Number: 6

## 2022-10-31 ENCOUNTER — Other Ambulatory Visit: Payer: Self-pay

## 2022-10-31 ENCOUNTER — Ambulatory Visit
Admission: RE | Admit: 2022-10-31 | Discharge: 2022-10-31 | Disposition: A | Discharge status: Home / Self Care | Payer: 59 | Source: Ambulatory Visit | Attending: Radiation Oncology | Admitting: Radiation Oncology

## 2022-10-31 ENCOUNTER — Ambulatory Visit: Payer: 59

## 2022-10-31 DIAGNOSIS — Z510 Encounter for antineoplastic radiation therapy: Secondary | ICD-10-CM | POA: Diagnosis not present

## 2022-10-31 LAB — RAD ONC ARIA SESSION SUMMARY
Course Elapsed Days: 8
Plan Fractions Treated to Date: 7
Plan Prescribed Dose Per Fraction: 3 Gy
Plan Total Fractions Prescribed: 10
Plan Total Prescribed Dose: 30 Gy
Reference Point Dosage Given to Date: 21 Gy
Reference Point Session Dosage Given: 3 Gy
Session Number: 7

## 2022-11-03 ENCOUNTER — Ambulatory Visit: Payer: 59

## 2022-11-03 ENCOUNTER — Other Ambulatory Visit: Payer: Self-pay

## 2022-11-03 ENCOUNTER — Ambulatory Visit
Admission: RE | Admit: 2022-11-03 | Discharge: 2022-11-03 | Disposition: A | Discharge status: Home / Self Care | Payer: 59 | Source: Ambulatory Visit | Attending: Radiation Oncology | Admitting: Radiation Oncology

## 2022-11-03 DIAGNOSIS — Z510 Encounter for antineoplastic radiation therapy: Secondary | ICD-10-CM | POA: Diagnosis not present

## 2022-11-03 LAB — RAD ONC ARIA SESSION SUMMARY
Course Elapsed Days: 11
Plan Fractions Treated to Date: 8
Plan Prescribed Dose Per Fraction: 3 Gy
Plan Total Fractions Prescribed: 10
Plan Total Prescribed Dose: 30 Gy
Reference Point Dosage Given to Date: 24 Gy
Reference Point Session Dosage Given: 3 Gy
Session Number: 8

## 2022-11-04 ENCOUNTER — Ambulatory Visit
Admission: RE | Admit: 2022-11-04 | Discharge: 2022-11-04 | Disposition: A | Discharge status: Home / Self Care | Payer: 59 | Source: Ambulatory Visit | Attending: Radiation Oncology | Admitting: Radiation Oncology

## 2022-11-04 ENCOUNTER — Other Ambulatory Visit: Payer: Self-pay

## 2022-11-04 ENCOUNTER — Ambulatory Visit: Payer: 59

## 2022-11-04 DIAGNOSIS — Z510 Encounter for antineoplastic radiation therapy: Secondary | ICD-10-CM | POA: Diagnosis not present

## 2022-11-04 LAB — RAD ONC ARIA SESSION SUMMARY
Course Elapsed Days: 12
Plan Fractions Treated to Date: 9
Plan Prescribed Dose Per Fraction: 3 Gy
Plan Total Fractions Prescribed: 10
Plan Total Prescribed Dose: 30 Gy
Reference Point Dosage Given to Date: 27 Gy
Reference Point Session Dosage Given: 3 Gy
Session Number: 9

## 2022-11-05 ENCOUNTER — Ambulatory Visit
Admission: RE | Admit: 2022-11-05 | Discharge: 2022-11-05 | Disposition: A | Discharge status: Home / Self Care | Payer: 59 | Source: Ambulatory Visit | Attending: Radiation Oncology | Admitting: Radiation Oncology

## 2022-11-05 ENCOUNTER — Ambulatory Visit: Payer: 59

## 2022-11-05 ENCOUNTER — Other Ambulatory Visit: Payer: Self-pay

## 2022-11-05 DIAGNOSIS — C50412 Malignant neoplasm of upper-outer quadrant of left female breast: Secondary | ICD-10-CM | POA: Insufficient documentation

## 2022-11-05 DIAGNOSIS — Z510 Encounter for antineoplastic radiation therapy: Secondary | ICD-10-CM | POA: Diagnosis not present

## 2022-11-05 LAB — RAD ONC ARIA SESSION SUMMARY
Course Elapsed Days: 13
Plan Fractions Treated to Date: 10
Plan Prescribed Dose Per Fraction: 3 Gy
Plan Total Fractions Prescribed: 10
Plan Total Prescribed Dose: 30 Gy
Reference Point Dosage Given to Date: 30 Gy
Reference Point Session Dosage Given: 3 Gy
Session Number: 10

## 2022-11-06 ENCOUNTER — Inpatient Hospital Stay: Payer: 59 | Attending: Hematology and Oncology | Admitting: Hematology and Oncology

## 2022-11-06 ENCOUNTER — Encounter: Payer: Self-pay | Admitting: Hematology and Oncology

## 2022-11-06 ENCOUNTER — Ambulatory Visit (HOSPITAL_COMMUNITY)
Admission: EM | Admit: 2022-11-06 | Discharge: 2022-11-07 | Disposition: A | Discharge status: Home / Self Care | Payer: 59 | Attending: Psychiatry | Admitting: Psychiatry

## 2022-11-06 VITALS — BP 136/76 | HR 99 | Temp 97.7°F | Resp 16 | Wt 192.1 lb

## 2022-11-06 DIAGNOSIS — F10129 Alcohol abuse with intoxication, unspecified: Secondary | ICD-10-CM | POA: Diagnosis not present

## 2022-11-06 DIAGNOSIS — R4589 Other symptoms and signs involving emotional state: Secondary | ICD-10-CM | POA: Diagnosis not present

## 2022-11-06 DIAGNOSIS — C419 Malignant neoplasm of bone and articular cartilage, unspecified: Secondary | ICD-10-CM | POA: Insufficient documentation

## 2022-11-06 DIAGNOSIS — F4323 Adjustment disorder with mixed anxiety and depressed mood: Secondary | ICD-10-CM | POA: Diagnosis not present

## 2022-11-06 DIAGNOSIS — Z170 Estrogen receptor positive status [ER+]: Secondary | ICD-10-CM | POA: Diagnosis not present

## 2022-11-06 DIAGNOSIS — R079 Chest pain, unspecified: Secondary | ICD-10-CM | POA: Diagnosis not present

## 2022-11-06 DIAGNOSIS — C50412 Malignant neoplasm of upper-outer quadrant of left female breast: Secondary | ICD-10-CM

## 2022-11-06 DIAGNOSIS — R45851 Suicidal ideations: Secondary | ICD-10-CM | POA: Diagnosis present

## 2022-11-06 DIAGNOSIS — Z79811 Long term (current) use of aromatase inhibitors: Secondary | ICD-10-CM | POA: Insufficient documentation

## 2022-11-06 DIAGNOSIS — Z79899 Other long term (current) drug therapy: Secondary | ICD-10-CM | POA: Insufficient documentation

## 2022-11-06 DIAGNOSIS — F332 Major depressive disorder, recurrent severe without psychotic features: Secondary | ICD-10-CM | POA: Diagnosis present

## 2022-11-06 DIAGNOSIS — F1020 Alcohol dependence, uncomplicated: Secondary | ICD-10-CM

## 2022-11-06 MED ORDER — ACETAMINOPHEN 325 MG PO TABS: 650.0000 mg | ORAL_TABLET | Freq: Four times a day (QID) | ORAL | Status: DC | PRN

## 2022-11-06 MED ORDER — OLANZAPINE 5 MG PO TBDP: 5.0000 mg | ORAL_TABLET | Freq: Three times a day (TID) | ORAL | Status: DC | PRN

## 2022-11-06 MED ORDER — ALUM & MAG HYDROXIDE-SIMETH 200-200-20 MG/5ML PO SUSP: 30.0000 mL | ORAL | Status: DC | PRN

## 2022-11-06 MED ORDER — MAGNESIUM HYDROXIDE 400 MG/5ML PO SUSP: 30.0000 mL | Freq: Every day | ORAL | Status: DC | PRN

## 2022-11-06 MED ORDER — ZIPRASIDONE MESYLATE 20 MG IM SOLR: 20.0000 mg | INTRAMUSCULAR | Status: DC | PRN

## 2022-11-06 MED ORDER — LORAZEPAM 1 MG PO TABS: 1.0000 mg | ORAL_TABLET | ORAL | Status: DC | PRN

## 2022-11-06 NOTE — ED Provider Notes (Signed)
[facility] Continuous Assessment Admission H&P  Date: 11/07/22 Patient Name: Tracie Atkins MRN: [id number] Chief Complaint: I have cancer in my back  Diagnoses:  Final diagnoses:  Suicidal ideation  Difficulty coping  Adjustment disorder with mixed anxiety and depressed mood    HPI: Tracie Atkins, 67 y/o female with a history of major depressive disorder, alcohol intoxication, suicidal ideation, presented to [facility] via [facility].  Per the patient I have bone cancer and today is my birthday and I am tired of life and started drinking 1/2 pint of liquor tonight and my son they do not care about me.  According to patient she is suicidal with a plan to end her life I just do not want to live anymore when asked what the plan patient stated I might OD or hang myself which is much better.  According to patient she lives with one of her son here in the [place] area according to the patient she went to the doctor today and got some bad news.  Patient is supposed to start medication for cancer treatment.  According to the patient she does experience chest pain but is not related to cardiovascular issues however it is related to her cancer.  According to patient she sleeps most of the time.  Patient denies seeing a psychiatrist at this time patient also denies seeing a therapist at this time.   Face-to-face observation of patient, patient is alert and oriented x 4, speech is clear, however patient can be tearful at times when talking about her sons.  According to the patient her sons do not care about her.  Patient endorsed suicidal ideation stating she might OD or better yet she might just hang herself.  Patient reports she drank half pint of alcohol tonight.  Patient denies illicit drug use.  Patient is very tearful and emotional state is unbalanced at this time.  Patient does not seem to be influenced by external or internal stimuli.  Recommend inpatient observation  Total Time spent with patient: 30  minutes  Musculoskeletal  Strength & Muscle Tone: within normal limits Gait & Station: normal Patient leans: N/A  Psychiatric Specialty Exam  Presentation General Appearance:  Casual  Eye Contact: Good  Speech: Clear and Coherent  Speech Volume: Normal  Handedness: Right   Mood and Affect  Mood: Anxious; Depressed  Affect: Depressed   Thought Process  Thought Processes: Coherent  Descriptions of Associations:Circumstantial  Orientation:Full (Time, Place and Person)  Thought Content:WDL  Diagnosis of Schizophrenia or Schizoaffective disorder in past: No data recorded  Hallucinations:Hallucinations: None  Ideas of Reference:None  Suicidal Thoughts:Suicidal Thoughts: Yes, Active SI Active Intent and/or Plan: With Intent; With Plan  Homicidal Thoughts:Homicidal Thoughts: No   Sensorium  Memory: Immediate Good  Judgment: Poor  Insight: Fair   Art therapist  Concentration: Fair  Attention Span: Good  Recall: Dud[place] Major of Knowledge: Fair  Language: Fair   Psychomotor Activity  Psychomotor Activity: Psychomotor Activity: Normal   Assets  Assets: Desire for Improvement; Resilience; Social Support   Sleep  Sleep: Sleep: Good Number of Hours of Sleep: 8   Nutritional Assessment (For OBS and FBC admissions only) Has the patient had a weight loss or gain of 10 pounds or more in the last 3 months?: No Has the patient had a decrease in food intake/or appetite?: Yes Does the patient have dental problems?: No Does the patient have eating habits or behaviors that may be indicators of an eating disorder including binging  or inducing vomiting?: No Has the patient recently lost weight without trying?: 0 Has the patient been eating poorly because of a decreased appetite?: 0 Malnutrition Screening Tool Score: 0    Physical Exam HENT:     Head: Normocephalic.     Nose: Nose normal.  Cardiovascular:     Rate and Rhythm:  Tachycardia present.  Pulmonary:     Effort: Pulmonary effort is normal.  Musculoskeletal:        General: Normal range of motion.     Cervical back: Normal range of motion.  Neurological:     General: No focal deficit present.     Mental Status: She is alert.  Psychiatric:        Mood and Affect: Mood normal.        Behavior: Behavior normal.        Thought Content: Thought content normal.        Judgment: Judgment normal.    Review of Systems  Constitutional: Negative.   HENT: Negative.    Eyes: Negative.   Respiratory: Negative.    Cardiovascular: Negative.   Gastrointestinal: Negative.   Genitourinary: Negative.   Musculoskeletal: Negative.   Skin: Negative.   Neurological: Negative.   Psychiatric/Behavioral:  Positive for depression and suicidal ideas. The patient is nervous/anxious.     Blood pressure 104/67, pulse (!) 101, temperature 98.6 F (37 C), temperature source Oral, resp. rate 18, SpO2 96 %. There is no height or weight on file to calculate BMI.  Past Psychiatric History: Depression, alcohol abuse, suicidal ideation  Is the patient at risk to self? Yes  Has the patient been a risk to self in the past 6 months? Yes .    Has the patient been a risk to self within the distant past? Yes   Is the patient a risk to others? No   Has the patient been a risk to others in the past 6 months? No   Has the patient been a risk to others within the distant past? No   Past Medical History: See chart  Family History: Unknown  Social History: Alcohol  Last Labs:  Admission on 11/06/2022  Component Date Value Ref Range Status   WBC 11/07/2022 4.1  4.0 - 10.5 K/uL Final   RBC 11/07/2022 4.94  3.87 - 5.11 MIL/uL Final   Hemoglobin 11/07/2022 13.3  12.0 - 15.0 g/dL Final   HCT 03/47/4259 41.2  36.0 - 46.0 % Final   MCV 11/07/2022 83.4  80.0 - 100.0 fL Final   MCH 11/07/2022 26.9  26.0 - 34.0 pg Final   MCHC 11/07/2022 32.3  30.0 - 36.0 g/dL Final   RDW 56/38/7564  16.7 (H)  11.5 - 15.5 % Final   Platelets 11/07/2022 191  150 - 400 K/uL Final   nRBC 11/07/2022 0.0  0.0 - 0.2 % Final   Neutrophils Relative % 11/07/2022 72  % Final   Neutro Abs 11/07/2022 3.0  1.7 - 7.7 K/uL Final   Lymphocytes Relative 11/07/2022 14  % Final   Lymphs Abs 11/07/2022 0.6 (L)  0.7 - 4.0 K/uL Final   Monocytes Relative 11/07/2022 11  % Final   Monocytes Absolute 11/07/2022 0.4  0.1 - 1.0 K/uL Final   Eosinophils Relative 11/07/2022 2  % Final   Eosinophils Absolute 11/07/2022 0.1  0.0 - 0.5 K/uL Final   Basophils Relative 11/07/2022 1  % Final   Basophils Absolute 11/07/2022 0.0  0.0 - 0.1 K/uL Final   Immature  Granulocytes 11/07/2022 0  % Final   Abs Immature Granulocytes 11/07/2022 0.00  0.00 - 0.07 K/uL Final   Performed at [facility], [address] [place], [place] [address]   Sodium 11/07/2022 142  135 - 145 mmol/L Final   Potassium 11/07/2022 3.3 (L)  3.5 - 5.1 mmol/L Final   Chloride 11/07/2022 110  98 - 111 mmol/L Final   CO2 11/07/2022 24  22 - 32 mmol/L Final   Glucose, Bld 11/07/2022 100 (H)  70 - 99 mg/dL Final   Glucose reference range applies only to samples taken after fasting for at least 8 hours.   BUN 11/07/2022 11  8 - 23 mg/dL Final   Creatinine, Ser 11/07/2022 0.58  0.44 - 1.00 mg/dL Final   Calcium 91/47/8295 8.7 (L)  8.9 - 10.3 mg/dL Final   Total Protein 62/13/0865 5.9 (L)  6.5 - 8.1 g/dL Final   Albumin 78/46/9629 3.6  3.5 - 5.0 g/dL Final   AST 52/84/1324 16  15 - 41 U/L Final   ALT 11/07/2022 17  0 - 44 U/L Final   Alkaline Phosphatase 11/07/2022 125  38 - 126 U/L Final   Total Bilirubin 11/07/2022 0.3  0.3 - 1.2 mg/dL Final   GFR, Estimated 11/07/2022 >60  >60 mL/min Final   Comment: (NOTE) Calculated using the CKD-EPI Creatinine Equation (2021)    Anion gap 11/07/2022 8  5 - 15 Final   Performed at [facility], [address], [place], [place] [address]   Hgb A1c MFr Bld 11/07/2022 5.6  4.8 - 5.6 % Final   Comment:  (NOTE) Pre diabetes:          5.7%-6.4%  Diabetes:              >6.4%  Glycemic control for   <7.0% adults with diabetes    Mean Plasma Glucose 11/07/2022 114.02  mg/dL Final   Performed at [facility], [address], [place], [place] [address]   Alcohol, Ethyl (B) 11/07/2022 140 (H)  <10 mg/dL Final   Comment: (NOTE) Lowest detectable limit for serum alcohol is 10 mg/dL.  For medical purposes only. Performed at [facility], [address] [place], [place] [address]    TSH 11/07/2022 0.496  0.350 - 4.500 uIU/mL Final   Comment: Performed by a 3rd Generation assay with a functional sensitivity of <=0.01 uIU/mL. Performed at [facility], [address], [place], [place] [address]   Orders Only on 11/05/2022  Component Date Value Ref Range Status   Course ID 11/05/2022 [email or web address]   Final   Course Start Date 11/05/2022 10/21/2022   Final   Session Number 11/05/2022 10   Final   Course First Treatment Date 11/05/2022 10/23/2022  2:15 PM   Final   Course Last Treatment Date 11/05/2022 11/05/2022 11:28 AM   Final   Course Elapsed Days 11/05/2022 13   Final   Reference Point ID 11/05/2022 Sternum dp   Final   Reference Point Dosage Given to Da* 11/05/2022 30  Gy Final   Reference Point Session Dosage Giv* 11/05/2022 3  Gy Final   Plan ID 11/05/2022 Chest   Final   Plan Name 11/05/2022 simsternum   Final   Plan Fractions Treated to Date 11/05/2022 10   Final   Plan Total Fractions Prescribed 11/05/2022 10   Final   Plan Prescribed Dose Per Fraction 11/05/2022 3  Gy Final   Plan Total  Prescribed Dose 11/05/2022 30.000000  Gy Final   Plan Primary Reference Point 11/05/2022 Sternum dp   Final  Orders Only on 11/04/2022  Component Date Value Ref Range Status   Course ID 11/04/2022 [email or web address]   Final   Course Start Date 11/04/2022 10/21/2022   Final   Session Number 11/04/2022 9   Final   Course First Treatment Date 11/04/2022 10/23/2022  2:15 PM   Final   Course Last  Treatment Date 11/04/2022 11/04/2022  3:40 PM   Final   Course Elapsed Days 11/04/2022 12   Final   Reference Point ID 11/04/2022 Sternum dp   Final   Reference Point Dosage Given to Da* 11/04/2022 27  Gy Final   Reference Point Session Dosage Giv* 11/04/2022 3  Gy Final   Plan ID 11/04/2022 Chest   Final   Plan Name 11/04/2022 simsternum   Final   Plan Fractions Treated to Date 11/04/2022 9   Final   Plan Total Fractions Prescribed 11/04/2022 10   Final   Plan Prescribed Dose Per Fraction 11/04/2022 3  Gy Final   Plan Total Prescribed Dose 11/04/2022 30.000000  Gy Final   Plan Primary Reference Point 11/04/2022 Sternum dp   Final  Orders Only on 11/03/2022  Component Date Value Ref Range Status   Course ID 11/03/2022 [email or web address]   Final   Course Start Date 11/03/2022 10/21/2022   Final   Session Number 11/03/2022 8   Final   Course First Treatment Date 11/03/2022 10/23/2022  2:15 PM   Final   Course Last Treatment Date 11/03/2022 11/03/2022 12:44 PM   Final   Course Elapsed Days 11/03/2022 11   Final   Reference Point ID 11/03/2022 Sternum dp   Final   Reference Point Dosage Given to Da* 11/03/2022 24  Gy Final   Reference Point Session Dosage Giv* 11/03/2022 3  Gy Final   Plan ID 11/03/2022 Chest   Final   Plan Name 11/03/2022 simsternum   Final   Plan Fractions Treated to Date 11/03/2022 8   Final   Plan Total Fractions Prescribed 11/03/2022 10   Final   Plan Prescribed Dose Per Fraction 11/03/2022 3  Gy Final   Plan Total Prescribed Dose 11/03/2022 30.000000  Gy Final   Plan Primary Reference Point 11/03/2022 Sternum dp   Final  Orders Only on 10/31/2022  Component Date Value Ref Range Status   Course ID 10/31/2022 C3_Multi   Final   Course Start Date 10/31/2022 10/21/2022   Final   Session Number 10/31/2022 7   Final   Course First Treatment Date 10/31/2022 10/23/2022  2:15 PM   Final   Course Last Treatment Date 10/31/2022 10/31/2022 12:37 PM   Final   Course Elapsed Days 10/31/2022  8   Final   Reference Point ID 10/31/2022 Sternum dp   Final   Reference Point Dosage Given to Da* 10/31/2022 21  Gy Final   Reference Point Session Dosage Giv* 10/31/2022 3  Gy Final   Plan ID 10/31/2022 Chest   Final   Plan Name 10/31/2022 simsternum   Final   Plan Fractions Treated to Date 10/31/2022 7   Final   Plan Total Fractions Prescribed 10/31/2022 10   Final   Plan Prescribed Dose Per Fraction 10/31/2022 3  Gy Final   Plan Total Prescribed Dose 10/31/2022 30.000000  Gy Final   Plan Primary Reference Point 10/31/2022 Sternum dp   Final  Orders Only on 10/30/2022  Component Date Value Ref Range  Status   Course ID 10/30/2022 C3_Multi   Final   Course Start Date 10/30/2022 10/21/2022   Final   Session Number 10/30/2022 6   Final   Course First Treatment Date 10/30/2022 10/23/2022  2:15 PM   Final   Course Last Treatment Date 10/30/2022 10/30/2022 12:25 PM   Final   Course Elapsed Days 10/30/2022 7   Final   Reference Point ID 10/30/2022 Sternum dp   Final   Reference Point Dosage Given to Da* 10/30/2022 18  Gy Final   Reference Point Session Dosage Giv* 10/30/2022 3  Gy Final   Plan ID 10/30/2022 Chest   Final   Plan Name 10/30/2022 simsternum   Final   Plan Fractions Treated to Date 10/30/2022 6   Final   Plan Total Fractions Prescribed 10/30/2022 10   Final   Plan Prescribed Dose Per Fraction 10/30/2022 3  Gy Final   Plan Total Prescribed Dose 10/30/2022 30.000000  Gy Final   Plan Primary Reference Point 10/30/2022 Sternum dp   Final  Orders Only on 10/29/2022  Component Date Value Ref Range Status   Course ID 10/29/2022 C3_Multi   Final   Course Start Date 10/29/2022 10/21/2022   Final   Session Number 10/29/2022 5   Final   Course First Treatment Date 10/29/2022 10/23/2022  2:15 PM   Final   Course Last Treatment Date 10/29/2022 10/29/2022  7:40 AM   Final   Course Elapsed Days 10/29/2022 6   Final   Reference Point ID 10/29/2022 Sternum dp   Final   Reference Point Dosage  Given to Da* 10/29/2022 15  Gy Final   Reference Point Session Dosage Giv* 10/29/2022 3  Gy Final   Plan ID 10/29/2022 Chest   Final   Plan Name 10/29/2022 simsternum   Final   Plan Fractions Treated to Date 10/29/2022 5   Final   Plan Total Fractions Prescribed 10/29/2022 10   Final   Plan Prescribed Dose Per Fraction 10/29/2022 3  Gy Final   Plan Total Prescribed Dose 10/29/2022 30.000000  Gy Final   Plan Primary Reference Point 10/29/2022 Sternum dp   Final  Orders Only on 10/28/2022  Component Date Value Ref Range Status   Course ID 10/28/2022 C3_Multi   Final   Course Start Date 10/28/2022 10/21/2022   Final   Session Number 10/28/2022 4   Final   Course First Treatment Date 10/28/2022 10/23/2022  2:15 PM   Final   Course Last Treatment Date 10/28/2022 10/28/2022 12:05 PM   Final   Course Elapsed Days 10/28/2022 5   Final   Reference Point ID 10/28/2022 Sternum dp   Final   Reference Point Dosage Given to Da* 10/28/2022 12  Gy Final   Reference Point Session Dosage Giv* 10/28/2022 3  Gy Final   Plan ID 10/28/2022 Chest   Final   Plan Name 10/28/2022 simsternum   Final   Plan Fractions Treated to Date 10/28/2022 4   Final   Plan Total Fractions Prescribed 10/28/2022 10   Final   Plan Prescribed Dose Per Fraction 10/28/2022 3  Gy Final   Plan Total Prescribed Dose 10/28/2022 30.000000  Gy Final   Plan Primary Reference Point 10/28/2022 Sternum dp   Final  Orders Only on 10/27/2022  Component Date Value Ref Range Status   Course ID 10/27/2022 C3_Multi   Final   Course Start Date 10/27/2022 10/21/2022   Final   Session Number 10/27/2022 3   Final  Course First Treatment Date 10/27/2022 10/23/2022  2:15 PM   Final   Course Last Treatment Date 10/27/2022 10/27/2022 12:19 PM   Final   Course Elapsed Days 10/27/2022 4   Final   Reference Point ID 10/27/2022 Sternum dp   Final   Reference Point Dosage Given to Da* 10/27/2022 9  Gy Final   Reference Point Session Dosage Giv* 10/27/2022 3   Gy Final   Plan ID 10/27/2022 Chest   Final   Plan Name 10/27/2022 simsternum   Final   Plan Fractions Treated to Date 10/27/2022 3   Final   Plan Total Fractions Prescribed 10/27/2022 10   Final   Plan Prescribed Dose Per Fraction 10/27/2022 3  Gy Final   Plan Total Prescribed Dose 10/27/2022 30.000000  Gy Final   Plan Primary Reference Point 10/27/2022 Sternum dp   Final  Orders Only on 10/24/2022  Component Date Value Ref Range Status   Course ID 10/24/2022 C3_Multi   Final   Course Start Date 10/24/2022 10/21/2022   Final   Session Number 10/24/2022 2   Final   Course First Treatment Date 10/24/2022 10/23/2022  2:15 PM   Final   Course Last Treatment Date 10/24/2022 10/24/2022  2:31 PM   Final   Course Elapsed Days 10/24/2022 1   Final   Reference Point ID 10/24/2022 Sternum dp   Final   Reference Point Dosage Given to Da* 10/24/2022 6  Gy Final   Reference Point Session Dosage Giv* 10/24/2022 3  Gy Final   Plan ID 10/24/2022 Chest   Final   Plan Name 10/24/2022 simsternum   Final   Plan Fractions Treated to Date 10/24/2022 2   Final   Plan Total Fractions Prescribed 10/24/2022 10   Final   Plan Prescribed Dose Per Fraction 10/24/2022 3  Gy Final   Plan Total Prescribed Dose 10/24/2022 30.000000  Gy Final   Plan Primary Reference Point 10/24/2022 Sternum dp   Final  There may be more visits with results that are not included.    Allergies: Patient has no known allergies.  Medications:  Facility Ordered Medications  Medication   acetaminophen (TYLENOL) tablet 650 mg   alum & mag hydroxide-simeth (MAALOX/MYLANTA) 200-200-20 MG/5ML suspension 30 mL   magnesium hydroxide (MILK OF MAGNESIA) suspension 30 mL   OLANZapine zydis (ZYPREXA) disintegrating tablet 5 mg   And   LORazepam (ATIVAN) tablet 1 mg   And   ziprasidone (GEODON) injection 20 mg   PTA Medications  Medication Sig   topiramate (TOPAMAX) 50 MG tablet Take 50 mg by mouth 2 (two) times daily.   oxyCODONE  (ROXICODONE) 5 MG immediate release tablet Take 1 tablet (5 mg total) by mouth every 4 (four) hours as needed for severe pain or breakthrough pain.   polyethylene glycol (MIRALAX) 17 g packet Take 17 g by mouth daily.      Medical Decision Making  Inpatient observation   Meds ordered this encounter  Medications   acetaminophen (TYLENOL) tablet 650 mg   alum & mag hydroxide-simeth (MAALOX/MYLANTA) 200-200-20 MG/5ML suspension 30 mL   magnesium hydroxide (MILK OF MAGNESIA) suspension 30 mL   AND Linked Order Group    OLANZapine zydis (ZYPREXA) disintegrating tablet 5 mg    LORazepam (ATIVAN) tablet 1 mg    ziprasidone (GEODON) injection 20 mg    Lab Orders         CBC with Differential/Platelet         Comprehensive metabolic panel  Hemoglobin A1c         Ethanol         TSH         POCT Urine Drug Screen - (I-Screen)         POC urine preg, ED      Recommendations  Based on my evaluation the patient appears to have an emergency medical condition for which I recommend the patient be transferred to the emergency department for further evaluation.  Sindy Guadeloupe, NP 11/07/22  6:01 AM

## 2022-11-06 NOTE — Progress Notes (Signed)
   11/06/22 2308  [facility] Triage Screening (Walk-ins at [facility] only)  How Did You Hear About [place]? Legal System  What Is the Reason for Your Visit/Call Today? Pt presents to [facility] voluntarily, accompanied by [employer] with complaint of suicidal ideation with a plan to hang herself. Pt reports an argument that took place between her and her son and she called the police because she wanted to leave. Pt reports having issues with her family often and that she is trying to undergo cancer treatment and does not feel like she has support. Pt is very tearful and says she gets overwhelmed with her living arrangements. Pt states " I'm just tired, I don't want to do this anymore". Pt has hx of MDD, anxiety and alcohol abuse. Pt currently denies HI, AVH.  How Long Has This Been Causing You Problems? 1 wk - 1 month  Have You Recently Had Any Thoughts About Hurting Yourself? Yes  How long ago did you have thoughts about hurting yourself? currently  Are You Planning to Commit Suicide/Harm Yourself At This time? Yes  Have you Recently Had Thoughts About Hurting Someone [name]? No  Are You Planning To Harm Someone At This Time? No  Are you currently experiencing any auditory, visual or other hallucinations? No  Have You Used Any Alcohol or Drugs in the Past 24 Hours? Yes  How long ago did you use Drugs or Alcohol? tonight  What Did You Use and How Much? pint of bacardi  Do you have any current medical co-morbidities that require immediate attention? No  Clinician description of patient physical appearance/behavior: tearful  What Do You Feel Would Help You the Most Today? Treatment for Depression or other mood problem  If access to [facility] was not available, would you have sought care in the Emergency Department? No  Determination of Need Emergent (2 hours)  Options For Referral Other: Comment;BH Urgent Care;Outpatient Therapy;Medication Management

## 2022-11-06 NOTE — BH Assessment (Signed)
Comprehensive Clinical Assessment (CCA) Note  11/06/2022 Tracie Atkins [id number]  Disposition:  Per Sindy Guadeloupe, NP, Inpatient treatment is recommended  The patient demonstrates the following risk factors for suicide: Chronic risk factors for suicide include: psychiatric disorder of depression, substance use disorder, and medical illness cancer . Acute risk factors for suicide include: family or marital conflict and social withdrawal/isolation. Protective factors for this patient include: responsibility to others (children, family). Considering these factors, the overall suicide risk at this point appears to be moderate. Patient is not appropriate for outpatient follow up.   Per triage Note: Pt presents to [facility] voluntarily, accompanied by [employer] with complaint of suicidal ideation with a plan to hang herself. Pt reports an argument that took place between her and her son and she called the police because she wanted to leave. Pt reports having issues with her family often and that she is trying to undergo cancer treatment and does not feel like she has support. Pt is very tearful and says she gets overwhelmed with her living arrangements. Pt states " I'm just tired, I don't want to do this anymore". Pt has hx of MDD, anxiety and alcohol abuse. Pt currently denies HI, AVH.   TTS Note: Patient presents to the [facility] with the police voluntarily.  She states that today is her birthday and states that for the second year in a row on her birthday that she got bad news.  Patient states that she was diagnosed with cancer.  Patient states that she was feeling depressed and states that she decided to drink with her son.  Patient states that she drank a pint of [employer].  Patient states that: "my son treats me like shit, he is against me. My life is messed up and I don't want to live like this anymore.  I thought about overdosing on my pills or handing myself. I called the police tonight because I wanted to get away  from everything and I knew I needed help."  Patient states that she has a history of depression, but states that she is not receiving any current treatment and states that she is not on any medication.  She states that she needs to see a psychiatrist and that her oncologist is working on making a referral for her, but it has not come through yet.  Patient states that she has experienced suicidal thoughts in the past, but states that she has never acted on these thoughts.  Patient states that she was hospitalized at [facility] approximately one year ago for her depression. Patient denies HI/Psychosis.  Patient denies any history of abuse or trauma, but identifies a history of self-mutilating by cutting.  Patient denies having access to weapons.  Patient states that she sleeps all the time and states that she has not eaten in several days.   Patient states that she has a history of cocaine addiction, but states that she has not used any cocaine since November 2022.  Patient admits to occasional use of alcohol, but denies any regular use.  Patient is currently intoxicated and denies any current withdrawal symptoms.  Patient states that she is single, but has three sons with their ages being from 60 to 60.  She states that she currently resides with one of her sons.  Patient states that she has a twelfth grade education.  She is currently receiving SSDI. Patient denies any current legal involvement.  Patient is alert and oriented, but intoxicated.  She is depressed and crying.  Her insight, impulse control and judgment are impaired.  Her thoughts are organized, but repetitive.  Her memory appears to be mostly intact.  She does not appear to be responding to any internal stimuli.  Her speech is normal in tone and rate and her eye contact is good.   AUDIT    Flowsheet Row Admission (Discharged) from 08/19/2021 in [facility] BE[facility]IORAL MEDICINE  Alcohol Use Disorder Identification Test Final Score (AUDIT) 5       PHQ2-9    Flowsheet Row ED from 11/06/2022 in [facility] ED from 09/28/2021 in [facility]  PHQ-2 Total Score 5 5  PHQ-9 Total Score 22 14      Flowsheet Row ED from 11/06/2022 in [facility] ED from 10/12/2022 in [facility] Emergency Department at [facility] ED from 10/11/2022 in [facility] Emergency Department at [facility]  C-SSRS RISK CATEGORY High Risk No Risk No Risk        Chief Complaint: No chief complaint on file.  Visit Diagnosis: F33.2 MDD Recurrent Severe without psychotic features    CCA Screening, Triage and Referral (STR)  Patient Reported Information How did you hear about [place]? Legal System  What Is the Reason for Your Visit/Call Today? Patient presents withthe police voluntarily  How Long Has This Been Causing You Problems? <Week  What Do You Feel Would Help You the Most Today? Treatment for Depression or other mood problem   Have You Recently Had Any Thoughts About Hurting Yourself? Yes  Are You Planning to Commit Suicide/Harm Yourself At This time? No   Flowsheet Row ED from 11/06/2022 in [facility] ED from 10/12/2022 in [facility] Emergency Department at [facility] ED from 10/11/2022 in [facility] Emergency Department at [facility]  C-SSRS RISK CATEGORY High Risk No Risk No Risk       Have you Recently Had Thoughts About Hurting Someone [name]? No  Are You Planning to Harm Someone at This Time? No  Explanation: Patient has been drinkingtonight.  Had thoughts of overdosing on pills, but no identified intent   Have You Used Any Alcohol or Drugs in the Past 24 Hours? Yes  What Did You Use and How Much? states that she drank pint of Bicardi tonight   Do You Currently Have a Therapist/Psychiatrist? No  Name of Therapist/Psychiatrist: Name of Therapist/Psychiatrist: none reported   Have You Been  Recently Discharged From Any Office Practice or Programs? No  Explanation of Discharge From Practice/Program: Last hospitalization at [facility] 08/2021     CCA Screening Triage Referral Assessment Type of Contact: Face-to-Face  Telemedicine Service Delivery:   Is this Initial or Reassessment?   Date Telepsych consult ordered in [facility]:    Time Telepsych consult ordered in [facility]:    Location of Assessment: [facility] Assessment Services  Provider Location: No data recorded  Collateral Involvement: None available   Does Patient Have a Court Appointed Legal Guardian? No  Legal Guardian Contact Information: NA  Copy of Legal Guardianship Form: -- (NA)  Legal Guardian Notified of Arrival: -- (NA)  Legal Guardian Notified of Pending Discharge: -- (NA)  If Minor and Not Living with Parent(s), Who has Custody? NA  Is CPS involved or ever been involved? Never  Is APS involved or ever been involved? Never   Patient Determined To Be At Risk for Harm To Self or Others Based on Review of Patient Reported  Information or Presenting Complaint? Yes, for Self-Harm  Method: Plan without intent  Availability of Means: Has close by  Intent: Vague intent or NA  Notification Required: No need or identified person  Additional Information for Danger to Others Potential: -- (none reported)  Additional Comments for Danger to Others Potential: no previous attempts  Are There Guns or Other Weapons in Your Home? No  Types of Guns/Weapons: NA  Are These Weapons Safely Secured?                            -- (NA)  Who Could Verify You Are Able To Have These Secured: no reported weapons  Do You Have any Outstanding Charges, Pending Court Dates, Parole/Probation? none reported  Contacted To Inform of Risk of Harm To Self or Others: Other: Comment (not needed)    Does Patient Present under Involuntary Commitment? No    [place] of Residence: [place]   Patient Currently Receiving the Following  Services: Not Receiving Services   Determination of Need: Urgent (48 hours)   Options For Referral: Inpatient Hospitalization; [facility] Urgent Care     CCA Biopsychosocial Patient Reported Schizophrenia/Schizoaffective Diagnosis in Past: No data recorded  Strengths: Patient states that she needs help with her depression.  Patient has a desire to get better   Mental Health Symptoms Depression:   Change in energy/activity; Hopelessness; Increase/decrease in appetite; Sleep (too much or little); Tearfulness   Duration of Depressive symptoms:  Duration of Depressive Symptoms: Less than two weeks   Mania:   N/A   Anxiety:    Worrying; Tension   Psychosis:   None   Duration of Psychotic symptoms:    Trauma:   None   Obsessions:   None   Compulsions:   None   Inattention:   None   Hyperactivity/Impulsivity:   None   Oppositional/Defiant Behaviors:   None   Emotional Irregularity:   Chronic feelings of emptiness; Frantic efforts to avoid abandonment; Intense/unstable relationships; Mood lability; Potentially harmful impulsivity   Other Mood/Personality Symptoms:   None noted    Mental Status Exam Appearance and self-care  Stature:   Average   Weight:   Average weight   Clothing:   Age-appropriate   Grooming:   Neglected   Cosmetic use:   None   Posture/gait:   Normal   Motor activity:   Repetitive   Sensorium  Attention:  No data recorded  Concentration:   Normal   Orientation:   X5   Recall/memory:   Defective in Short-term   Affect and Mood  Affect:   Depressed   Mood:   Depressed   Relating  Eye contact:   Fleeting   Facial expression:   Depressed   Attitude toward examiner:   Cooperative   Thought and Language  Speech flow:  Clear and Coherent   Thought content:   Appropriate to Mood and Circumstances   Preoccupation:   None   Hallucinations:   None   Organization:   Audiological scientist of Knowledge:   Average   Intelligence:   Above Average   Abstraction:   Normal   Judgement:   Impaired   Reality Testing:   Adequate   Insight:   Fair   Decision Making:   Impulsive   Social Functioning  Social Maturity:   Impulsive   Social Judgement:   Normal   Stress  Stressors:   Family conflict;  Transitions; Housing; Illness   Coping Ability:   Overwhelmed; Exhausted   Skill Deficits:   Self-control   Supports:   Family; Support needed     Religion: Religion/Spirituality Are You A Religious Person?:  (not assessed) How Might This Affect Treatment?: Not assessed  Leisure/Recreation:    Exercise/Diet: Exercise/Diet Do You Exercise?: No Do You Follow a Special Diet?: No Do You Have Any Trouble Sleeping?: No (states that she sleeps excessively)   CCA Employment/Education Employment/Work Situation: Employment / Work Situation Employment Situation: On disability Why is Patient on Disability: Pt shares she does not read or write well How Long has Patient Been on Disability: Since 1999 Patient's Job has Been Impacted by Current Illness: No Has Patient ever Been in the [employer]?: No  Education: Education Is Patient Currently Attending School?: No Last Grade Completed: 12 Did You Attend College?: No Did You Have An Individualized Education Program (IIEP): No Did You Have Any Difficulty At School?: No Patient's Education Has Been Impacted by Current Illness: No   CCA Family/Childhood History Family and Relationship History: Family history Marital status: Single Does patient have children?: Yes How many children?: 3 How is patient's relationship with their children?: Pt states that she is fairly close with 2 of her sons but feels that they are somewhat estranged currently  Childhood History:  Childhood History By whom was/is the patient raised?: Mother Did patient suffer any verbal/emotional/physical/sexual abuse as a child?:  No Did patient suffer from severe childhood neglect?: No Has patient ever been sexually abused/assaulted/raped as an adolescent or adult?: No Was the patient ever a victim of a crime or a disaster?: No Witnessed domestic violence?: No Has patient been affected by domestic violence as an adult?: No       CCA Substance Use Alcohol/Drug Use: Alcohol / Drug Use Pain Medications: See MAR Prescriptions: See MAR Over the Counter: See MAR History of alcohol / drug use?: Yes Longest period of sobriety (when/how long): states that she drinks occasionally.  Patient has a histoy of crack cocaine use, but states that she has not used cocaine since November 2022 Negative Consequences of Use: Personal relationships Withdrawal Symptoms: None Substance #1 Name of Substance 1: alcohol 1 - Age of First Use: 16 1 - Amount (size/oz): 1 pint liquor 1 - Frequency: occasionally 1 - Duration: unknown 1 - Last Use / Amount: 1 pint Bicardi 1 - Method of Aquiring: store 1- Route of Use: oral                       ASAM's:  Six Dimensions of Multidimensional Assessment  Dimension 1:  Acute Intoxication and/or Withdrawal Potential:   Dimension 1:  Description of individual's past and current experiences of substance use and withdrawal: Patient is currently intoxicated and has no current withdrawal symptoms  Dimension 2:  Biomedical Conditions and Complications:   Dimension 2:  Description of patient's biomedical conditions and  complications: Patient states that she was recently diagnosed with cancer  Dimension 3:  Emotional, Behavioral, or Cognitive Conditions and Complications:  Dimension 3:  Description of emotional, behavioral, or cognitive conditions and complications: Patient states that she is very depressed and having suicidal thoughts  Dimension 4:  Readiness to Change:  Dimension 4:  Description of Readiness to Change criteria: pATIENT STATES THAT SHE WANTS HELP FOR HER DEPRESSION   Dimension 5:  Relapse, Continued use, or Continued Problem Potential:  Dimension 5:  Relapse, continued use, or continued problem potential critiera  description: patient has been able to discontinue her use of cocaine, but still drinks occasionally  Dimension 6:  Recovery/Living Environment:  Dimension 6:  Recovery/Iiving environment criteria description: Patient states that she lives with her son, but their is conflict in her relationships  ASAM Severity Score: ASAM's Severity Rating Score: 9  ASAM Recommended Level of Treatment: ASAM Recommended Level of Treatment: Level II Intensive Outpatient Treatment   Substance use Disorder (SUD) Substance Use Disorder (SUD)  Checklist Symptoms of Substance Use: Continued use despite having a persistent/recurrent physical/psychological problem caused/exacerbated by use, Continued use despite persistent or recurrent social, interpersonal problems, caused or exacerbated by use, Persistent desire or unsuccessful efforts to cut down or control use, Substance(s) often taken in larger amounts or over longer times than was intended  Recommendations for Services/Supports/Treatments: Recommendations for Services/Supports/Treatments Recommendations For Services/Supports/Treatments: IOP (Intensive Outpatient Program), Peer Support, Individual Therapy, Medication Management, Other (Comment)  Discharge Disposition:    DSM5 Diagnoses: Patient Active Problem List   Diagnosis Date Noted   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   Malignant neoplasm of upper-outer quadrant of left breast in female, estrogen receptor positive (HCC) 12/05/2021   MDD (major depressive disorder), recurrent severe, without psychosis (HCC) 08/19/2021   Suicidal ideations    Alcohol abuse with alcohol-induced mood disorder (HCC) 11/20/2016     Referrals to Alternative Service(s): Referred to Alternative Service(s):   Place:   Date:   Time:    Referred to Alternative Service(s):   Place:    Date:   Time:    Referred to Alternative Service(s):   Place:   Date:   Time:    Referred to Alternative Service(s):   Place:   Date:   Time:     Loredana Medellin J Rylan Bernard, LCAS

## 2022-11-06 NOTE — Progress Notes (Signed)
[facility] CONSULT NOTE  Patient Care Team: Patient, No Pcp Per as PCP - General (General Practice) Pershing Proud, RN as Oncology Nurse Navigator Donnelly Angelica, RN as Oncology Nurse Navigator Rachel Moulds, MD as Consulting Physician (Hematology and Oncology) Dorothy Puffer, MD as Consulting Physician (Radiation Oncology) [name], MD as Consulting Physician (General Surgery)  CHIEF COMPLAINTS/PURPOSE OF CONSULTATION:  Newly diagnosed breast cancer  HISTORY OF PRESENTING ILLNESS:  Tracie Atkins 67 y.o. female is here because of recent diagnosis of left breast IDC  I reviewed her records extensively and collaborated the history with the patient.  SUMMARY OF ONCOLOGIC HISTORY: Oncology History  Malignant neoplasm of upper-outer quadrant of left breast in female, estrogen receptor positive (HCC)  11/05/2021 Initial Diagnosis   Palpable lump in the left breast with the pain along with right-sided nipple discharge.  Mammogram reveals highly suspicious mass left breast 3 o'clock position 2.2 cm, 2 abnormal left axillary lymph nodes: Biopsy: Grade 2 IDC ER 100%, PR 0%, Ki-67 10%, HER2 2+ by IHC, FISH negative ratio 1.4, axillary lymph node biopsy: IDC, ER 100%, PR 20%, Ki-67 10%, HER2 negative ratio 1.31   11/28/2021 Breast MRI   Left breast cancer 2.7 cm and 0.7 cm linear enhancement UOQ left breast needs biopsy, 0.5 cm mass posterior to the right nipple needs biopsy, 2-lymph nodes left axilla, 1.5 cm sternal lesion   12/05/2021 Cancer Staging   Staging form: Breast, AJCC 8th Edition - Clinical: Stage IIA (cT2, cN1, cM0, G2, ER+, PR+, HER2-) - Signed by Serena Croissant, MD on 12/05/2021 Stage prefix: Initial diagnosis Histologic grading system: 3 grade system   01/15/2022 Pathology Results   SURGICAL PATHOLOGY  CASE: [id number]  PATIENT: Tracie Atkins  Surgical Pathology Report      Clinical History: left breast cancer (cm)   FINAL MICROSCOPIC DIAGNOSIS:   A.  BREAST, LEFT, LUMPECTOMY:  Invasive ductal carcinoma with clear margins of resection.  Please see the synoptic report after specimen D.   B. BREAST, RIGHT, LUMPECTOMY:  Fibrous scar with hemosiderin deposits.  Carcinoma is not identified in the resected specimen.   C. LYMPH NODE, LEFT AXILLARY #2, SENTINEL, EXCISION:  A lymph node negative for metastatic carcinoma.   D. 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   pTNM classification ( AJCC 8th Edition): pT2, pN1a  Results of prognostic markers performed on prior biopsy from 11/05/2021  ([id number])           ER: Positive in 100% of tumor cells.           PR: Negative.           Ki-67: Positive in 10% of tumor cells.           HER-2/neu: 2+ by IHC/ Negative by FISH.    02/17/2022 Oncotype testing   Oncotype of 11, no benefit from chemotherapy.   03/26/2022 - 04/11/2022 Radiation Therapy   9 fractions totaling 16.2 Gy prior to relocation to [place]    Interval History Tracie Atkins is here with her 3 sons and daughter. She was lost to follow-up after her last visit here.  She did not get adjuvant radiation or antiestrogen therapy.  She went back to [place] apparently has some family there.  Her son explains that she does not have a good living situation.  In [place] or weight here.  She has a lot of social issues as well  as underlying psychiatric issues with history of depression and anxiety and alcohol use.  She is not taking her depression medications as recommended.  She was recently seen in the ED with severe back pain and was found to have metastatic disease based on imaging, completed palliative radiation and is here to discuss about systemic therapy options.  She is taking pain medication as needed for management of pain.  She denies any active suicidal ideation to me but appears to be feeling low about current situation which is understandable.  Other than the  back pain, she denies any complaints today for me.  Rest of the pertinent 10 point ROS reviewed and negative   MEDICAL HISTORY:  Past Medical History:  Diagnosis Date   Breast cancer (HCC) 2023   left   Chronic dental infection 8/189   multiple teeth removed and Post -op infection.   Depression    ETOH abuse    Fatty liver    Headache     SURGICAL HISTORY: Past Surgical History:  Procedure Laterality Date   BREAST LUMPECTOMY WITH RADIOACTIVE SEED AND SENTINEL LYMPH NODE BIOPSY Left 01/15/2022   Procedure: LEFT BREAST SEED LOCALIZED LUMPECTOMY, LEFT SEED TARGETED AXILLARY LYMPH NODE BIOPSY, LEFT SENTINEL LYMPH NODE BIOPSY;  Surgeon: [name], MD;  Location: [facility] OR;  Service: General;  Laterality: Left;   DENTAL SURGERY     RADIOACTIVE SEED GUIDED EXCISIONAL BREAST BIOPSY Right 01/15/2022   Procedure: RIGHT BREAST SEED LOCALIZED EXCISIONAL BIOPSY;  Surgeon: [name], MD;  Location: [facility] OR;  Service: General;  Laterality: Right;   TUBAL LIGATION      SOCIAL HISTORY: Social History   Socioeconomic History   Marital status: Single    Spouse name: Not on file   Number of children: 3   Years of education: Not on file   Highest education level: Not on file  Occupational History   Not on file  Tobacco Use   Smoking status: Never    Passive exposure: Never   Smokeless tobacco: Never  Vaping Use   Vaping Use: Never used  Substance and Sexual Activity   Alcohol use: Yes    Alcohol/week: 1.0 standard drink of alcohol    Types: 1 Standard drinks or equivalent per week    Comment: 1 mixed (liquor) drink per week   Drug use: No   Sexual activity: Never  Other Topics Concern   Not on file  Social History Narrative   Not on file   Social Determinants of Health   Financial Resource Strain: Not on file  Food Insecurity: No Food Insecurity (10/16/2022)   Hunger Vital Sign    Worried About Running Out of Food in the Last Year: Never true    Ran Out of Food in the Last  Year: Never true  Transportation Needs: Unmet Transportation Needs (07/30/2022)   PRAPARE - Administrator, Civil Service (Medical): Yes    Lack of Transportation (Non-Medical): No  Physical Activity: Not on file  Stress: Not on file  Social Connections: Not on file  Intimate Partner Violence: Not At Risk (10/16/2022)   Humiliation, Afraid, Rape, and Kick questionnaire    Fear of Current or Ex-Partner: No    Emotionally Abused: No    Physically Abused: No    Sexually Abused: No    FAMILY HISTORY: No family history on file.  ALLERGIES:  has No Known Allergies.  MEDICATIONS:  No current facility-administered medications for this visit.   Current Outpatient Medications  Medication Sig Dispense Refill   anastrozole (ARIMIDEX) 1 MG tablet Take 1 tablet (1 mg total) by mouth daily. 90 tablet 3   escitalopram (LEXAPRO) 10 MG tablet Take 10 mg by mouth daily.     fluticasone (FLONASE) 50 MCG/ACT nasal spray Place 2 sprays into both nostrils daily.     oxyCODONE (ROXICODONE) 5 MG immediate release tablet Take 1 tablet (5 mg total) by mouth every 4 (four) hours as needed for severe pain or breakthrough pain. 60 tablet 0   pantoprazole (PROTONIX) 40 MG tablet Take 40 mg by mouth every morning.     topiramate (TOPAMAX) 50 MG tablet Take 50 mg by mouth 2 (two) times daily.     Facility-Administered Medications Ordered in Other Visits  Medication Dose Route Frequency Provider Last Rate Last Admin   acetaminophen (TYLENOL) tablet 650 mg  650 mg Oral Q6H PRN Sindy Guadeloupe, NP       alum & mag hydroxide-simeth (MAALOX/MYLANTA) 200-200-20 MG/5ML suspension 30 mL  30 mL Oral Q4H PRN Sindy Guadeloupe, NP       OLANZapine zydis (ZYPREXA) disintegrating tablet 5 mg  5 mg Oral Q8H PRN Sindy Guadeloupe, NP       And   LORazepam (ATIVAN) tablet 1 mg  1 mg Oral PRN Sindy Guadeloupe, NP       And   ziprasidone (GEODON) injection 20 mg  20 mg Intramuscular PRN Sindy Guadeloupe, NP       magnesium  hydroxide (MILK OF MAGNESIA) suspension 30 mL  30 mL Oral Daily PRN Sindy Guadeloupe, NP        REVIEW OF SYSTEMS:   Constitutional: Denies fevers, chills or abnormal night sweats Eyes: Denies blurriness of vision, double vision or watery eyes Ears, nose, mouth, throat, and face: Denies mucositis or sore throat Respiratory: Denies cough, dyspnea or wheezes Cardiovascular: Denies palpitation, chest discomfort or lower extremity swelling Gastrointestinal:  Denies nausea, heartburn or change in bowel habits Skin: Denies abnormal skin rashes Lymphatics: Denies new lymphadenopathy or easy bruising Neurological:Denies numbness, tingling or new weaknesses Behavioral/Psych: Mood is stable, no new changes  Breast:  Denies any palpable lumps or discharge All other systems were reviewed with the patient and are negative.  PHYSICAL EXAMINATION: ECOG PERFORMANCE STATUS: 0 - Asymptomatic  Vitals:   11/06/22 1358  BP: 136/76  Pulse: 99  Resp: 16  Temp: 97.7 F (36.5 C)  SpO2: 95%    Filed Weights   11/06/22 1358  Weight: 192 lb 1.6 oz (87.1 kg)     PE deferred in lieu of counseling   LABORATORY DATA:  I have reviewed the data as listed Lab Results  Component Value Date   WBC 4.1 11/07/2022   HGB 13.3 11/07/2022   HCT 41.2 11/07/2022   MCV 83.4 11/07/2022   PLT 191 11/07/2022   Lab Results  Component Value Date   NA 142 11/07/2022   K 3.3 (L) 11/07/2022   CL 110 11/07/2022   CO2 24 11/07/2022    RADIOGRAPHIC STUDIES: I have personally reviewed the radiological reports and agreed with the findings in the report.  ASSESSMENT AND PLAN:  Malignant neoplasm of upper-outer quadrant of left breast in female, estrogen receptor positive (HCC) This is a pleasant 67 yr old female patient with newly diagnosed left breast IDC, T2N1/Stage IIA, ER positive, PR negative, Her 2 negative, Ki 67 of 10%, oncotype of 11 referred to medical oncology for adjuvant recommendations.  Given  oncotype results, we recommended adjuvant radiation followed  by antiestrogen therapy.  She however was lost to follow-up, did not answer any of her phone calls, apparently went back to [place].  She decided to come back to [place], her 3 sons and her daughter-in-law are present with her.  Most recently she went to the ED with severe back pain and was found to have metastatic disease in the thoracic spine, underwent palliative radiation and is now reestablishing with [place] to discuss systemic therapy options.  We have discussed that this is advanced breast cancer and the intent of treatment is palliative.  We cannot cure it however we may be able to treat it.  I have discussed once again about initiating antiestrogen therapy at the soonest.  She has major underlying psychiatric issues but denied any active suicidal ideation with me although her kids said that she is depressed because of what has been happening in the past few weeks.  I have discussed about mechanism of action of aromatase inhibitors, adverse effects including but not limited to postmenopausal symptoms such as hot flashes, vaginal dryness, arthralgias, mood swings, bone density loss.  I have strongly encouraged her to reestablish with psychiatry, restart her medications, we have engage social work for assistance regarding her social living and mental health situation.  I encouraged her sons to call us back with the doses of her medications so we can represcribe them in the meantime. Although repeat biopsy would have been ideal in the setting of metastatic disease, since she is postradiation, it is likely not going to give a good yield.  If she starts anastrozole and tolerates it well, we will plan to add Ibrance for first-line metastatic disease.  Will also try to use Guardant360 from the blood to find any targetable mutations for future lines of treatment.  Total time spent: 45 min  All questions were answered. The patient knows to call the  clinic with any problems, questions or concerns.    Rachel Moulds, MD 11/07/22

## 2022-11-06 NOTE — Assessment & Plan Note (Addendum)
This is a pleasant 67 yr old female patient with newly diagnosed left breast IDC, T2N1/Stage IIA, ER positive, PR negative, Her 2 negative, Ki 67 of 10%, oncotype of 11 referred to medical oncology for adjuvant recommendations.  Given oncotype results, we recommended adjuvant radiation followed by antiestrogen therapy.  She however was lost to follow-up, did not answer any of her phone calls, apparently went back to [place].  She decided to come back to [place], her 3 sons and her daughter-in-law are present with her.  Most recently she went to the ED with severe back pain and was found to have metastatic disease in the thoracic spine, underwent palliative radiation and is now reestablishing with [place] to discuss systemic therapy options.  We have discussed that this is advanced breast cancer and the intent of treatment is palliative.  We cannot cure it however we may be able to treat it.  I have discussed once again about initiating antiestrogen therapy at the soonest.  She has major underlying psychiatric issues but denied any active suicidal ideation with me although her kids said that she is depressed because of what has been happening in the past few weeks.  I have discussed about mechanism of action of aromatase inhibitors, adverse effects including but not limited to postmenopausal symptoms such as hot flashes, vaginal dryness, arthralgias, mood swings, bone density loss.  I have strongly encouraged her to reestablish with psychiatry, restart her medications, we have engage social work for assistance regarding her social living and mental health situation.  I encouraged her sons to call us back with the doses of her medications so we can represcribe them in the meantime. Although repeat biopsy would have been ideal in the setting of metastatic disease, since she is postradiation, it is likely not going to give a good yield.  If she starts anastrozole and tolerates it well, we will plan to add Ibrance for  first-line metastatic disease.  Will also try to use Guardant360 from the blood to find any targetable mutations for future lines of treatment.

## 2022-11-07 ENCOUNTER — Telehealth: Payer: Self-pay | Admitting: Hematology and Oncology

## 2022-11-07 DIAGNOSIS — F4323 Adjustment disorder with mixed anxiety and depressed mood: Secondary | ICD-10-CM | POA: Diagnosis not present

## 2022-11-07 DIAGNOSIS — R45851 Suicidal ideations: Secondary | ICD-10-CM | POA: Diagnosis not present

## 2022-11-07 DIAGNOSIS — R4589 Other symptoms and signs involving emotional state: Secondary | ICD-10-CM

## 2022-11-07 DIAGNOSIS — F1020 Alcohol dependence, uncomplicated: Secondary | ICD-10-CM

## 2022-11-07 LAB — POCT URINE DRUG SCREEN - MANUAL ENTRY (I-SCREEN)
POC Amphetamine UR: NOT DETECTED
POC Buprenorphine (BUP): NOT DETECTED
POC Cocaine UR: NOT DETECTED
POC Marijuana UR: NOT DETECTED
POC Methadone UR: NOT DETECTED
POC Methamphetamine UR: NOT DETECTED
POC Morphine: NOT DETECTED
POC Oxazepam (BZO): NOT DETECTED
POC Oxycodone UR: NOT DETECTED
POC Secobarbital (BAR): NOT DETECTED

## 2022-11-07 LAB — COMPREHENSIVE METABOLIC PANEL
ALT: 17 U/L (ref 0–44)
AST: 16 U/L (ref 15–41)
Albumin: 3.6 g/dL (ref 3.5–5.0)
Alkaline Phosphatase: 125 U/L (ref 38–126)
Anion gap: 8 (ref 5–15)
BUN: 11 mg/dL (ref 8–23)
CO2: 24 mmol/L (ref 22–32)
Calcium: 8.7 mg/dL — ABNORMAL LOW (ref 8.9–10.3)
Chloride: 110 mmol/L (ref 98–111)
Creatinine, Ser: 0.58 mg/dL (ref 0.44–1.00)
GFR, Estimated: >60 mL/min (ref >60)
Glucose, Bld: 100 mg/dL — ABNORMAL HIGH (ref 70–99)
Potassium: 3.3 mmol/L — ABNORMAL LOW (ref 3.5–5.1)
Sodium: 142 mmol/L (ref 135–145)
Total Bilirubin: 0.3 mg/dL (ref 0.3–1.2)
Total Protein: 5.9 g/dL — ABNORMAL LOW (ref 6.5–8.1)

## 2022-11-07 LAB — CBC WITH DIFFERENTIAL/PLATELET
Abs Immature Granulocytes: 0 10*3/uL (ref 0.00–0.07)
Basophils Absolute: 0 10*3/uL (ref 0.0–0.1)
Basophils Relative: 1 %
Eosinophils Absolute: 0.1 10*3/uL (ref 0.0–0.5)
Eosinophils Relative: 2 %
HCT: 41.2 % (ref 36.0–46.0)
Hemoglobin: 13.3 g/dL (ref 12.0–15.0)
Immature Granulocytes: 0 %
Lymphocytes Relative: 14 %
Lymphs Abs: 0.6 10*3/uL — ABNORMAL LOW (ref 0.7–4.0)
MCH: 26.9 pg (ref 26.0–34.0)
MCHC: 32.3 g/dL (ref 30.0–36.0)
MCV: 83.4 fL (ref 80.0–100.0)
Monocytes Absolute: 0.4 10*3/uL (ref 0.1–1.0)
Monocytes Relative: 11 %
Neutro Abs: 3 10*3/uL (ref 1.7–7.7)
Neutrophils Relative %: 72 %
Platelets: 191 10*3/uL (ref 150–400)
RBC: 4.94 MIL/uL (ref 3.87–5.11)
RDW: 16.7 % — ABNORMAL HIGH (ref 11.5–15.5)
WBC: 4.1 10*3/uL (ref 4.0–10.5)
nRBC: 0 % (ref 0.0–0.2)

## 2022-11-07 LAB — TSH: TSH: 0.496 u[IU]/mL (ref 0.350–4.500)

## 2022-11-07 LAB — ETHANOL: Alcohol, Ethyl (B): 140 mg/dL — ABNORMAL HIGH (ref <10)

## 2022-11-07 LAB — HEMOGLOBIN A1C
Hgb A1c MFr Bld: 5.6 % (ref 4.8–5.6)
Mean Plasma Glucose: 114.02 mg/dL

## 2022-11-07 LAB — POC URINE PREG, ED: Preg Test, Ur: NEGATIVE

## 2022-11-07 MED ORDER — ANASTROZOLE 1 MG PO TABS: 1.0000 mg | ORAL_TABLET | Freq: Every day | ORAL | 3 refills | Status: DC

## 2022-11-07 NOTE — Discharge Instructions (Addendum)
Discharge recommendations:   Medications: Patient is to take medications as prescribed. No medication changes were made during your visit today. The patient or patient's guardian is to contact a medical professional and/or outpatient provider to address any new side effects that develop. The patient or the patient's guardian should update outpatient providers of any new medications and/or medication changes.   Outpatient Follow up: Please review list of outpatient resources for psychiatry and counseling. Please follow up with your primary care provider for all medical related needs.   Therapy: We recommend that patient participate in individual therapy to address mental health concerns.  Safety:   The following safety precautions should be taken:   No sharp objects. This includes scissors, razors, scrapers, and putty knives.   Chemicals should be removed and locked up.   Medications should be removed and locked up.   Weapons should be removed and locked up. This includes firearms, knives and instruments that can be used to cause injury.   The patient should abstain from use of illicit substances/drugs and abuse of any medications.  If symptoms worsen or do not continue to improve or if the patient becomes actively suicidal or homicidal then it is recommended that the patient return to the closest hospital emergency department, the [facility], or call 911 for further evaluation and treatment. National Suicide Prevention Lifeline 1-800-SUICIDE or 440 376 9617.  About 988 988 offers 24/7 access to trained crisis counselors who can help people experiencing mental health-related distress. People can call or text 988 or chat 988lifeline.org for themselves or if they are worried about a loved one who may need crisis support.   Please contact one of the following facilities to start medication management and therapy services:   [facility] [address] [place] [address]  [place], [place] [address] [phone]   [facility]  [address] [place], [place] [address] [phone]  [facility] [place]  [address] Vella Raring [place], [place] [address] [phone]  [facility]  [address]  [place], [place] [address] [phone]  [facility]  [address] [place], [place] [address] [phone]  [facility]  [address],  [place], [place] [address] [phone]

## 2022-11-07 NOTE — ED Notes (Signed)
Pt is a 67 y.o. female who presented to [facility] voluntarily, accompanied by [employer] with complaint of suicidal ideation with a plan to hang herself. Pt reports an argument that took place between her and her son and she called the police because she wanted to leave. Pt reports having issues with her family often and that she is trying to undergo cancer treatment and does not feel like she has support. Pt is very tearful and says she gets overwhelmed with her living arrangements. Pt states " I'm just tired, I don't want to do this anymore". Pt has hx of MDD, anxiety and alcohol abuse. Pt currently denies HI, AVH. Patient resting quietly in bed with eyes closed, Respirations equal and unlabored, skin warm and dry, NAD. Routine safety checks conducted according to facility protocol. Will continue to monitor for safety.

## 2022-11-07 NOTE — ED Notes (Signed)
Patient observed/assessed in bed/chair resting quietly appearing in no distress and verbalizing no complaints at this time. Will continue to monitor.  

## 2022-11-07 NOTE — ED Provider Notes (Signed)
FBC/OBS ASAP Discharge Summary  Date and Time: 11/07/2022 12:19 PM  Name: Tracie Atkins  MRN:  [id number]   Discharge Diagnoses:  Final diagnoses:  Suicidal ideation  Difficulty coping  Adjustment disorder with mixed anxiety and depressed mood    Subjective: Patient seen and evaluated face-to-face by this provider, chart reviewed and case discussed with Dr. Lucianne Muss. On evaluation, patient is alert and oriented x 4. Her thought process is linear and goal oriented. She denies SI/HI/AVH. No psychosis observed on exam. Her speech is clear and coherent. Her mood is dysphoric and affect is congruent. She has fair eye contact. She is calm and cooperative and does not appear to be in acute distress. Patient states that yesterday she was drinking because it was her birthday and was intoxicated when she made a statement that she wanted to kill herself. Patient's BAL on arrival was 140. She states that she does not want to kill herself and only made those statements after she had an argument with her son yesterday and was feeling sad. She reports feeling sad because she wants to go back to [place] where she is from but she needs to finish her treatments here in [place] for her cancer. She states that her children wanted her to move to [place] 5 years ago so they could help care for her. She states that she would like to go home today because she is supposed to start a new medication for her bone cancer in her back. She identifies her 3 sons, 2 brothers and sister as her support system. She states that her siblings call her every day to check in. She identifies protective factors as: sense of responsibility to family, religion (catholic), lives with grandson who is 20 y.o. who is supportive, and no access to weapons in the home. She states that she enjoys exercising and walking outdoors. She states that prior to yesterday she was sober from alcohol for 3 months. She denies using illicit drugs. She resides with her 53 y.o. grandson.  She denies outpatient therapy. She is agreeable to following up with outpatient therapy to address current stressors and mood. Patient gives verbal consent for this provider to speak to her son Tracie Atkins [phone] to safety plan. Tracie Atkins, denies safety concerns with the patient returning home. He was advised that the patient is recommended to follow-up with outpatient therapy.  He was advised that as safety precautions the patient should not have access to any weapons in the home including firearms and sharp objects. If the patient's symptoms worsen or if she expresses active suicidal ideations to take the patient to the [facility] behavioral health urgent care, nearest emergency department or call 911 for evaluation. Tracie Atkins verbalizes understanding and agrees to the stated plan of care.  Stay Summary: Per chart review, Tracie Atkins, 67 y/o female with a history of major depressive disorder, alcohol intoxication, suicidal ideation, presented to [facility] via [facility]. Per the patient I have bone cancer and today is my birthday and I am tired of life and started drinking 1/2 pint of liquor tonight and my son they do not care about me. According to patient she is suicidal with a plan to end her life I just do not want to live anymore when asked what the plan patient stated I might OD or hang myself which is much better. According to patient she lives with one of her son here in the [place] area according to the patient she went to the doctor today  and got some bad news.  Patient is supposed to start medication for cancer treatment. According to the patient she does experience chest pain but is not related to cardiovascular issues however it is related to her cancer. According to patient she sleeps most of the time. Patient denies seeing a psychiatrist at this time patient also denies seeing a therapist at this time.   Total Time spent with patient: 30 minutes  Past Psychiatric History:  Past Medical  History:  Family History:  Family Psychiatric History:  Social History:  Tobacco Cessation:  N/A, patient does not currently use tobacco products  Current Medications:  Current Facility-Administered Medications  Medication Dose Route Frequency Provider Last Rate Last Admin   acetaminophen (TYLENOL) tablet 650 mg  650 mg Oral Q6H PRN Sindy Guadeloupe, NP       alum & mag hydroxide-simeth (MAALOX/MYLANTA) 200-200-20 MG/5ML suspension 30 mL  30 mL Oral Q4H PRN Sindy Guadeloupe, NP       OLANZapine zydis (ZYPREXA) disintegrating tablet 5 mg  5 mg Oral Q8H PRN Sindy Guadeloupe, NP       And   LORazepam (ATIVAN) tablet 1 mg  1 mg Oral PRN Sindy Guadeloupe, NP       And   ziprasidone (GEODON) injection 20 mg  20 mg Intramuscular PRN Sindy Guadeloupe, NP       magnesium hydroxide (MILK OF MAGNESIA) suspension 30 mL  30 mL Oral Daily PRN Sindy Guadeloupe, NP       Current Outpatient Medications  Medication Sig Dispense Refill   escitalopram (LEXAPRO) 10 MG tablet Take 10 mg by mouth daily.     fluticasone (FLONASE) 50 MCG/ACT nasal spray Place 2 sprays into both nostrils daily.     oxyCODONE (ROXICODONE) 5 MG immediate release tablet Take 1 tablet (5 mg total) by mouth every 4 (four) hours as needed for severe pain or breakthrough pain. 60 tablet 0   pantoprazole (PROTONIX) 40 MG tablet Take 40 mg by mouth every morning.     anastrozole (ARIMIDEX) 1 MG tablet Take 1 tablet (1 mg total) by mouth daily. 90 tablet 3   topiramate (TOPAMAX) 50 MG tablet Take 50 mg by mouth 2 (two) times daily.      PTA Medications:  Facility Ordered Medications  Medication   acetaminophen (TYLENOL) tablet 650 mg   alum & mag hydroxide-simeth (MAALOX/MYLANTA) 200-200-20 MG/5ML suspension 30 mL   magnesium hydroxide (MILK OF MAGNESIA) suspension 30 mL   OLANZapine zydis (ZYPREXA) disintegrating tablet 5 mg   And   LORazepam (ATIVAN) tablet 1 mg   And   ziprasidone (GEODON) injection 20 mg   PTA Medications  Medication Sig    oxyCODONE (ROXICODONE) 5 MG immediate release tablet Take 1 tablet (5 mg total) by mouth every 4 (four) hours as needed for severe pain or breakthrough pain.   escitalopram (LEXAPRO) 10 MG tablet Take 10 mg by mouth daily.   pantoprazole (PROTONIX) 40 MG tablet Take 40 mg by mouth every morning.   fluticasone (FLONASE) 50 MCG/ACT nasal spray Place 2 sprays into both nostrils daily.   topiramate (TOPAMAX) 50 MG tablet Take 50 mg by mouth 2 (two) times daily.   anastrozole (ARIMIDEX) 1 MG tablet Take 1 tablet (1 mg total) by mouth daily.       11/06/2022   11:37 PM 09/28/2021    9:45 PM  Depression screen PHQ 2/9  Decreased Interest 2 2  Down, Depressed, Hopeless 3 3  PHQ - 2 Score  5 5  Altered sleeping 3 1  Tired, decreased energy 3 1  Change in appetite 3 1  Feeling bad or failure about yourself  3 3  Trouble concentrating 2 1  Moving slowly or fidgety/restless 2 0  Suicidal thoughts 1 2  PHQ-9 Score 22 14  Difficult doing work/chores Very difficult Extremely dIfficult    Flowsheet Row ED from 11/06/2022 in [facility] ED from 10/12/2022 in [facility] at [facility] ED from 10/11/2022 in [facility] Department at [facility]  C-SSRS RISK CATEGORY Error: Question 2 not populated No Risk No Risk       Musculoskeletal  Strength & Muscle Tone: within normal limits Gait & Station: normal Patient leans: N/A  Psychiatric Specialty Exam  Presentation  General Appearance:  Casual  Eye Contact: Good  Speech: Clear and Coherent  Speech Volume: Normal  Handedness: Right   Mood and Affect  Mood: Anxious; Depressed  Affect: Depressed   Thought Process  Thought Processes: Coherent  Descriptions of Associations:Circumstantial  Orientation:Full (Time, Place and Person)  Thought Content:WDL  Diagnosis of Schizophrenia or Schizoaffective disorder in past: No data recorded    Hallucinations:Hallucinations: None  Ideas of Reference:None  Suicidal Thoughts:Suicidal Thoughts: Yes, Active SI Active Intent and/or Plan: With Intent; With Plan  Homicidal Thoughts:Homicidal Thoughts: No   Sensorium  Memory: Immediate Good  Judgment: Poor  Insight: Fair   Art therapist  Concentration: Fair  Attention Span: Good  Recall: Dud[place] Major of Knowledge: Fair  Language: Fair   Psychomotor Activity  Psychomotor Activity: Psychomotor Activity: Normal   Assets  Assets: Desire for Improvement; Resilience; Social Support   Sleep  Sleep: Sleep: Good Number of Hours of Sleep: 8   Nutritional Assessment (For OBS and FBC admissions only) Has the patient had a weight loss or gain of 10 pounds or more in the last 3 months?: No Has the patient had a decrease in food intake/or appetite?: Yes Does the patient have dental problems?: No Does the patient have eating habits or behaviors that may be indicators of an eating disorder including binging or inducing vomiting?: No Has the patient recently lost weight without trying?: 0 Has the patient been eating poorly because of a decreased appetite?: 0 Malnutrition Screening Tool Score: 0    Physical Exam  Physical Exam HENT:     Nose: Nose normal.  Cardiovascular:     Rate and Rhythm: Normal rate.  Pulmonary:     Effort: Pulmonary effort is normal.  Musculoskeletal:        General: Normal range of motion.     Cervical back: Normal range of motion.  Neurological:     Mental Status: She is alert and oriented to person, place, and time.    Review of Systems  Constitutional: Negative.   HENT: Negative.    Eyes: Negative.   Respiratory:  Positive for cough.   Cardiovascular: Negative.   Gastrointestinal: Negative.   Genitourinary: Negative.   Musculoskeletal: Negative.   Neurological: Negative.   Endo/Heme/Allergies: Negative.    Blood pressure 122/79, pulse (!) 107, temperature 98.3  F (36.8 C), temperature source Oral, resp. rate 18, SpO2 96 %. There is no height or weight on file to calculate BMI.  Demographic Factors:  Age 31 or older, Caucasian, and Unemployed  Loss Factors: Decline in physical health  Historical Factors: Prior suicide attempts  Risk Reduction Factors:   Sense of responsibility to family, Living with another  person, especially a relative, and Positive social support  Continued Clinical Symptoms:  Previous Psychiatric Diagnoses and Treatments  Cognitive Features That Contribute To Risk:  None    Suicide Risk:  Minimal: No identifiable suicidal ideation.  Patients presenting with no risk factors but with morbid ruminations; may be classified as minimal risk based on the severity of the depressive symptoms  Plan Of Care/Follow-up recommendations:  Discharge recommendations:   Medications: Patient is to take medications as prescribed. No medication changes were made during your visit today. The patient or patient's guardian is to contact a medical professional and/or outpatient provider to address any new side effects that develop. The patient or the patient's guardian should update outpatient providers of any new medications and/or medication changes.   Outpatient Follow up: Please review list of outpatient resources for psychiatry and counseling. Please follow up with your primary care provider for all medical related needs.   Therapy: We recommend that patient participate in individual therapy to address mental health concerns.  Safety:   The following safety precautions should be taken:   No sharp objects. This includes scissors, razors, scrapers, and putty knives.   Chemicals should be removed and locked up.   Medications should be removed and locked up.   Weapons should be removed and locked up. This includes firearms, knives and instruments that can be used to cause injury.   The patient should abstain from use of illicit  substances/drugs and abuse of any medications.  If symptoms worsen or do not continue to improve or if the patient becomes actively suicidal or homicidal then it is recommended that the patient return to the closest hospital emergency department, the [facility], or call 911 for further evaluation and treatment. National Suicide Prevention Lifeline 1-800-SUICIDE or 508-387-0725.  About 988 988 offers 24/7 access to trained crisis counselors who can help people experiencing mental health-related distress. People can call or text 988 or chat 988lifeline.org for themselves or if they are worried about a loved one who may need crisis support.   Please contact one of the following facilities to start medication management and therapy services:   [facility] [address]  [place], [place] [address] [phone]   [facility]  [address] [place], [place] [address] [phone]  [facility]  [address] [place], [place] [address] [phone]  [facility]  [address]  [place], [place] [address] [phone]  [facility]  [address] [place], [place] [address] [phone]  [facility]  [address],  [place], [place] [address] [phone]  Disposition: Discharge. Patient's son Tracie Atkins will pick patient up from the [facility] today.   Layla Barter, NP 11/07/2022, 12:19 PM

## 2022-11-07 NOTE — Telephone Encounter (Signed)
Called patient tree times unable to leave patient a vm received no answer.

## 2022-11-07 NOTE — Progress Notes (Signed)
Received Tracie Atkins this AM asleep in her chair bed, she woke up coughing. She denied all of the psychiatric symptoms this AM. She is concerned about the medication she  was prescribed for her back cancer. She stated her son needs to pick her medication it up from the pharmacy.

## 2022-11-10 NOTE — Radiation Completion Notes (Addendum)
  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 The patient tolerated radiation. She developed fatigue during therapy.  The patient will receive a call in about one month from the radiation oncology department. She will continue follow up with Dr. Al Pimple as well.      Osker Mason, PAC

## 2022-11-13 ENCOUNTER — Ambulatory Visit (HOSPITAL_COMMUNITY)
Admission: RE | Admit: 2022-11-13 | Discharge: 2022-11-13 | Disposition: A | Discharge status: Home / Self Care | Payer: 59 | Source: Ambulatory Visit | Attending: Internal Medicine | Admitting: Internal Medicine

## 2022-11-13 DIAGNOSIS — E236 Other disorders of pituitary gland: Secondary | ICD-10-CM | POA: Insufficient documentation

## 2022-11-13 MED ORDER — GADOBUTROL 1 MMOL/ML IV SOLN
9.0000 mL | Freq: Once | INTRAVENOUS | Status: AC | PRN
  Administered 2022-11-13: 9 mL via INTRAVENOUS

## 2022-11-14 ENCOUNTER — Telehealth: Payer: Self-pay | Admitting: Internal Medicine

## 2022-11-17 ENCOUNTER — Inpatient Hospital Stay: Payer: 59

## 2022-11-17 ENCOUNTER — Other Ambulatory Visit: Payer: Self-pay

## 2022-11-17 ENCOUNTER — Inpatient Hospital Stay (HOSPITAL_BASED_OUTPATIENT_CLINIC_OR_DEPARTMENT_OTHER): Payer: 59 | Admitting: Internal Medicine

## 2022-11-17 ENCOUNTER — Ambulatory Visit: Payer: 59 | Admitting: Internal Medicine

## 2022-11-17 VITALS — BP 144/93 | HR 85 | Temp 98.1°F | Resp 16 | Wt 189.2 lb

## 2022-11-17 DIAGNOSIS — C50412 Malignant neoplasm of upper-outer quadrant of left female breast: Secondary | ICD-10-CM | POA: Diagnosis present

## 2022-11-17 DIAGNOSIS — Z170 Estrogen receptor positive status [ER+]: Secondary | ICD-10-CM | POA: Insufficient documentation

## 2022-11-17 DIAGNOSIS — E236 Other disorders of pituitary gland: Secondary | ICD-10-CM | POA: Diagnosis not present

## 2022-11-17 DIAGNOSIS — C773 Secondary and unspecified malignant neoplasm of axilla and upper limb lymph nodes: Secondary | ICD-10-CM | POA: Diagnosis not present

## 2022-11-17 DIAGNOSIS — C7951 Secondary malignant neoplasm of bone: Secondary | ICD-10-CM | POA: Diagnosis not present

## 2022-11-17 NOTE — Progress Notes (Signed)
[facility] [address]  [place], [place] [address] [phone]   Interval Evaluation  Date of Service: 11/17/22 Patient Name: Tracie Atkins Patient MRN: [id number] Patient DOB: [birth year] Provider: Henreitta Leber, MD  Identifying Statement:  Tracie Atkins is a 67 y.o. female with Pituitary mass [facility])   Primary Cancer:  Oncologic History: Oncology History  Malignant neoplasm of upper-outer quadrant of left breast in female, estrogen receptor positive (HCC)  11/05/2021 Initial Diagnosis   Palpable lump in the left breast with the pain along with right-sided nipple discharge.  Mammogram reveals highly suspicious mass left breast 3 o'clock position 2.2 cm, 2 abnormal left axillary lymph nodes: Biopsy: Grade 2 IDC ER 100%, PR 0%, Ki-67 10%, HER2 2+ by IHC, FISH negative ratio 1.4, axillary lymph node biopsy: IDC, ER 100%, PR 20%, Ki-67 10%, HER2 negative ratio 1.31   11/28/2021 Breast MRI   Left breast cancer 2.7 cm and 0.7 cm linear enhancement UOQ left breast needs biopsy, 0.5 cm mass posterior to the right nipple needs biopsy, 2-lymph nodes left axilla, 1.5 cm sternal lesion   12/05/2021 Cancer Staging   Staging form: Breast, AJCC 8th Edition - Clinical: Stage IIA (cT2, cN1, cM0, G2, ER+, PR+, HER2-) - Signed by Serena Croissant, MD on 12/05/2021 Stage prefix: Initial diagnosis Histologic grading system: 3 grade system   01/15/2022 Pathology Results   SURGICAL PATHOLOGY  CASE: [id number]  PATIENT: Tracie Atkins  Surgical Pathology Report      Clinical History: left breast cancer (cm)   FINAL MICROSCOPIC DIAGNOSIS:   A. BREAST, LEFT, LUMPECTOMY:  Invasive ductal carcinoma with clear margins of resection.  Please see the synoptic report after specimen D.   B. BREAST, RIGHT, LUMPECTOMY:  Fibrous scar with hemosiderin deposits.  Carcinoma is not identified in the resected specimen.   C. LYMPH NODE, LEFT AXILLARY #2, SENTINEL,  EXCISION:  A lymph node negative for metastatic carcinoma.   D. 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   pTNM classification ( AJCC 8th Edition): pT2, pN1a  Results of prognostic markers performed on prior biopsy from 11/05/2021  ([id number])           ER: Positive in 100% of tumor cells.           PR: Negative.           Ki-67: Positive in 10% of tumor cells.           HER-2/neu: 2+ by IHC/ Negative by FISH.    02/17/2022 Oncotype testing   Oncotype of 11, no benefit from chemotherapy.   03/26/2022 - 04/11/2022 Radiation Therapy   9 fractions totaling 16.2 Gy prior to relocation to [place]     Interval History: Tracie Atkins presents today for follow up after recent MRI brain.  She recently completed radiation to her breast and spine with Dr. Mitzi Hansen.  Denies new or progressive neurologic deficits.  Continues to experience some headaches.  Back pain is improved following thoracic spine RT.  Now on targeted hormone therapy with Dr. Al Pimple, tolerating that well.    H+P (07/31/22) Patient presents today for evaluation given abnormal brain MRI.  Imaging was performed given elevated prolactin level and clear nipple discharge noted last spring.  MRI demonstrated small lesion c/w likely pituitary adenoma.  No follow up on this since May 2023.  She does have headaches in recent weeks, but attributes  this to ongoing issues with dental infection.  Planning radiation for breast cancer with Dr. Joellen Jersey team, upcoming.  Medications: Current Outpatient Medications on File Prior to Visit  Medication Sig Dispense Refill   anastrozole (ARIMIDEX) 1 MG tablet Take 1 tablet (1 mg total) by mouth daily. 90 tablet 3   escitalopram (LEXAPRO) 10 MG tablet Take 10 mg by mouth daily.     fluticasone (FLONASE) 50 MCG/ACT nasal spray Place 2 sprays into both nostrils daily.     oxyCODONE (ROXICODONE) 5 MG immediate release tablet Take 1  tablet (5 mg total) by mouth every 4 (four) hours as needed for severe pain or breakthrough pain. 60 tablet 0   pantoprazole (PROTONIX) 40 MG tablet Take 40 mg by mouth every morning.     topiramate (TOPAMAX) 50 MG tablet Take 50 mg by mouth 2 (two) times daily.     No current facility-administered medications on file prior to visit.    Allergies: No Known Allergies Past Medical History:  Past Medical History:  Diagnosis Date   Breast cancer (HCC) 2023   left   Chronic dental infection 8/189   multiple teeth removed and Post -op infection.   Depression    ETOH abuse    Fatty liver    Headache    Past Surgical History:  Past Surgical History:  Procedure Laterality Date   BREAST LUMPECTOMY WITH RADIOACTIVE SEED AND SENTINEL LYMPH NODE BIOPSY Left 01/15/2022   Procedure: LEFT BREAST SEED LOCALIZED LUMPECTOMY, LEFT SEED TARGETED AXILLARY LYMPH NODE BIOPSY, LEFT SENTINEL LYMPH NODE BIOPSY;  Surgeon: [name], MD;  Location: [facility] OR;  Service: General;  Laterality: Left;   DENTAL SURGERY     RADIOACTIVE SEED GUIDED EXCISIONAL BREAST BIOPSY Right 01/15/2022   Procedure: RIGHT BREAST SEED LOCALIZED EXCISIONAL BIOPSY;  Surgeon: [name], MD;  Location: [facility] OR;  Service: General;  Laterality: Right;   TUBAL LIGATION     Social History:  Social History   Socioeconomic History   Marital status: Single    Spouse name: Not on file   Number of children: 3   Years of education: Not on file   Highest education level: Not on file  Occupational History   Not on file  Tobacco Use   Smoking status: Never    Passive exposure: Never   Smokeless tobacco: Never  Vaping Use   Vaping Use: Never used  Substance and Sexual Activity   Alcohol use: Yes    Alcohol/week: 1.0 standard drink of alcohol    Types: 1 Standard drinks or equivalent per week    Comment: 1 mixed (liquor) drink per week   Drug use: No   Sexual activity: Never  Other Topics Concern   Not on file  Social History  Narrative   Not on file   Social Determinants of Health   Financial Resource Strain: Not on file  Food Insecurity: No Food Insecurity (10/16/2022)   Hunger Vital Sign    Worried About Running Out of Food in the Last Year: Never true    Ran Out of Food in the Last Year: Never true  Transportation Needs: Unmet Transportation Needs (07/30/2022)   PRAPARE - Administrator, Civil Service (Medical): Yes    Lack of Transportation (Non-Medical): No  Physical Activity: Not on file  Stress: Not on file  Social Connections: Not on file  Intimate Partner Violence: Not At Risk (10/16/2022)   Humiliation, Afraid, Rape, and Kick questionnaire    Fear of  Current or Ex-Partner: No    Emotionally Abused: No    Physically Abused: No    Sexually Abused: No   Family History: No family history on file.  Review of Systems: Constitutional: Doesn't report fevers, chills or abnormal weight loss Eyes: Doesn't report blurriness of vision Ears, nose, mouth, throat, and face: Doesn't report sore throat Respiratory: Doesn't report cough, dyspnea or wheezes Cardiovascular: Doesn't report palpitation, chest discomfort  Gastrointestinal:  Doesn't report nausea, constipation, diarrhea GU: Doesn't report incontinence Skin: Doesn't report skin rashes Neurological: Per HPI Musculoskeletal: Doesn't report joint pain Behavioral/Psych: Doesn't report anxiety  Physical Exam: Vitals:   11/17/22 1218  BP: (!) 144/93  Pulse: 85  Resp: 16  Temp: 98.1 F (36.7 C)  SpO2: 99%    KPS: 80. General: Alert, cooperative, pleasant, in no acute distress Head: Normal EENT: No conjunctival injection or scleral icterus.  Lungs: Resp effort normal Cardiac: Regular rate Abdomen: Non-distended abdomen Skin: No rashes cyanosis or petechiae. Extremities: No clubbing or edema  Neurologic Exam: Mental Status: Awake, alert, attentive to examiner. Oriented to self and environment. Language is fluent with intact  comprehension.  Cranial Nerves: Visual acuity is grossly normal. Visual fields are full. Extra-ocular movements intact. No ptosis. Face is symmetric Motor: Tone and bulk are normal. Power is full in both arms and legs. Reflexes are symmetric, no pathologic reflexes present.  Sensory: Intact to light touch Gait: Normal.   Labs: I have reviewed the data as listed    Component Value Date/Time   NA 142 11/07/2022 0031   K 3.3 (L) 11/07/2022 0031   CL 110 11/07/2022 0031   CO2 24 11/07/2022 0031   GLUCOSE 100 (H) 11/07/2022 0031   BUN 11 11/07/2022 0031   CREATININE 0.58 11/07/2022 0031   CALCIUM 8.7 (L) 11/07/2022 0031   PROT 5.9 (L) 11/07/2022 0031   ALBUMIN 3.6 11/07/2022 0031   AST 16 11/07/2022 0031   ALT 17 11/07/2022 0031   ALKPHOS 125 11/07/2022 0031   BILITOT 0.3 11/07/2022 0031   GFRNONAA >60 11/07/2022 0031   GFRAA >60 06/12/2018 1323   Lab Results  Component Value Date   WBC 4.1 11/07/2022   NEUTROABS 3.0 11/07/2022   HGB 13.3 11/07/2022   HCT 41.2 11/07/2022   MCV 83.4 11/07/2022   PLT 191 11/07/2022   Imaging:  [facility] Clinician Interpretation: I have personally reviewed the CNS images as listed.  My interpretation, in the context of the patient's clinical presentation, is stable disease  NM PET Image Initial (PI) Skull Base To Thigh  Result Date: 10/20/2022 CLINICAL DATA:  Initial treatment strategy for stage IV breast carcinoma. EXAM: NUCLEAR MEDICINE PET SKULL BASE TO THIGH TECHNIQUE: 10.3 mCi F-18 FDG was injected intravenously. Full-ring PET imaging was performed from the skull base to thigh after the radiotracer. CT data was obtained and used for attenuation correction and anatomic localization. Fasting blood glucose: 100 mL mg/dl COMPARISON:  None Available. FINDINGS: Mediastinal blood pool activity: SUV max 2.6 Liver activity: SUV max NA NECK: No hypermetabolic lymph nodes in the neck. Incidental CT findings: None. CHEST: Postsurgical change in the RIGHT  breast with biopsy clips and mild metabolic activity. No hypermetabolic RIGHT axial lymph nodes. Postsurgical change in the LEFT breast with a 3.2 cm seroma with mild metabolic activity. No hypermetabolic LEFT axillary lymph nodes. Metabolic LEFT internal mammary lymph node with SUV max equal 4.5 on image 77. Node is subtle measuring 6 mm. No hypermetabolic mediastinal nodes. No suspicious pulmonary nodules.  Incidental CT findings: Mild basilar atelectasis. Large hiatal hernia. ABDOMEN/PELVIS: No abnormal hypermetabolic activity within the liver, pancreas, adrenal glands, or spleen. No hypermetabolic lymph nodes in the abdomen or pelvis. Incidental CT findings: Multiple diverticula of the descending colon and sigmoid colon without acute inflammation. Uterus and adnexa unremarkable. SKELETON: Several hypermetabolic skeletal metastasis. Intense metabolic activity within a mid sternal lesion with SUV max equal 10.9 Intense activity in the midthoracic spine involving the anterior and posterior columns of the T6 vertebral body. SUV max equal 8.7 on image 67. Extensive involvement of the vertebral body and neural arch. Associated sclerosis on the CT portion exam. Hypermetabolic activity T5 and T7 posterior elements additionally Metabolic lesion in the inferior RIGHT scapula with SUV max equal 7.6. Additional hypermetabolic metastatic lesion in the LEFT iliac bone. Incidental CT findings: None. IMPRESSION: 1. Multifocal hypermetabolic skeletal metastasis involving the sternum, T6 vertebral body, T5 and T7 posterior elements, and inferior RIGHT scapula. 2. Hypermetabolic LEFT internal mammary lymph node most consistent with metastatic adenopathy. 3. Postsurgical change in the RIGHT and LEFT breast. No residual carcinoma identified in the breasts or axilla by FDG PET imaging. Electronically Signed   By: Genevive Bi M.D.   On: 10/20/2022 17:18     Assessment/Plan Pituitary mass (HCC)  Tracie Atkins presents with  clinical and radiographic syndrome most c/w pituitary microadenoma.  Prolactinoma is unlikely because of normal serum level in January.  Per my read MRI demonstrates stable findings, though official read is pending.  We recommended repeating MRI brain in 12 months for continued surveillance.    She will continue to follow with Dr. Al Pimple for breast cancer.  We appreciate the opportunity to participate in the care of Tracie Atkins.   We ask that Tracie Atkins return to clinic in 12 months following next brain MRI, or sooner as needed.  All questions were answered. The patient knows to call the clinic with any problems, questions or concerns. No barriers to learning were detected.  The total time spent in the encounter was 30 minutes and more than 50% was on counseling and review of test results   Henreitta Leber, MD Medical Director of Neuro-Oncology [facility] 11/17/22 12:15 PM

## 2022-11-18 ENCOUNTER — Encounter (HOSPITAL_COMMUNITY): Payer: Self-pay

## 2022-11-18 ENCOUNTER — Emergency Department (HOSPITAL_COMMUNITY): Payer: 59

## 2022-11-18 ENCOUNTER — Other Ambulatory Visit: Payer: Self-pay

## 2022-11-18 ENCOUNTER — Emergency Department (HOSPITAL_COMMUNITY)
Admission: EM | Admit: 2022-11-18 | Discharge: 2022-11-18 | Disposition: A | Discharge status: Home / Self Care | Payer: 59 | Attending: Emergency Medicine | Admitting: Emergency Medicine

## 2022-11-18 DIAGNOSIS — R0781 Pleurodynia: Secondary | ICD-10-CM

## 2022-11-18 DIAGNOSIS — R052 Subacute cough: Secondary | ICD-10-CM | POA: Diagnosis not present

## 2022-11-18 DIAGNOSIS — R059 Cough, unspecified: Secondary | ICD-10-CM | POA: Diagnosis present

## 2022-11-18 LAB — BASIC METABOLIC PANEL
Anion gap: 8 (ref 5–15)
BUN: 11 mg/dL (ref 8–23)
CO2: 24 mmol/L (ref 22–32)
Calcium: 8.8 mg/dL — ABNORMAL LOW (ref 8.9–10.3)
Chloride: 107 mmol/L (ref 98–111)
Creatinine, Ser: 0.69 mg/dL (ref 0.44–1.00)
GFR, Estimated: >60 mL/min (ref >60)
Glucose, Bld: 107 mg/dL — ABNORMAL HIGH (ref 70–99)
Potassium: 3.7 mmol/L (ref 3.5–5.1)
Sodium: 139 mmol/L (ref 135–145)

## 2022-11-18 LAB — CBC WITH DIFFERENTIAL/PLATELET
Abs Immature Granulocytes: 0.01 10*3/uL (ref 0.00–0.07)
Basophils Absolute: 0 10*3/uL (ref 0.0–0.1)
Basophils Relative: 1 %
Eosinophils Absolute: 0 10*3/uL (ref 0.0–0.5)
Eosinophils Relative: 1 %
HCT: 42 % (ref 36.0–46.0)
Hemoglobin: 13.3 g/dL (ref 12.0–15.0)
Immature Granulocytes: 0 %
Lymphocytes Relative: 9 %
Lymphs Abs: 0.4 10*3/uL — ABNORMAL LOW (ref 0.7–4.0)
MCH: 27.1 pg (ref 26.0–34.0)
MCHC: 31.7 g/dL (ref 30.0–36.0)
MCV: 85.7 fL (ref 80.0–100.0)
Monocytes Absolute: 0.6 10*3/uL (ref 0.1–1.0)
Monocytes Relative: 16 %
Neutro Abs: 2.8 10*3/uL (ref 1.7–7.7)
Neutrophils Relative %: 73 %
Platelets: 185 10*3/uL (ref 150–400)
RBC: 4.9 MIL/uL (ref 3.87–5.11)
RDW: 16.4 % — ABNORMAL HIGH (ref 11.5–15.5)
WBC: 3.8 10*3/uL — ABNORMAL LOW (ref 4.0–10.5)
nRBC: 0 % (ref 0.0–0.2)

## 2022-11-18 MED ORDER — ACETAMINOPHEN 500 MG PO TABS
1000.0000 mg | ORAL_TABLET | Freq: Once | ORAL | Status: AC
  Administered 2022-11-18: 1000 mg via ORAL
  Filled 2022-11-18: qty 2

## 2022-11-18 MED ORDER — AMOXICILLIN-POT CLAVULANATE 875-125 MG PO TABS: 1.0000 | ORAL_TABLET | Freq: Two times a day (BID) | ORAL | 0 refills | Status: DC

## 2022-11-18 MED ORDER — DOXYCYCLINE HYCLATE 100 MG PO CAPS: 100.0000 mg | ORAL_CAPSULE | Freq: Two times a day (BID) | ORAL | 0 refills | Status: AC

## 2022-11-18 MED ORDER — IOHEXOL 350 MG/ML SOLN
80.0000 mL | Freq: Once | INTRAVENOUS | Status: AC | PRN
  Administered 2022-11-18: 80 mL via INTRAVENOUS

## 2022-11-18 NOTE — Discharge Instructions (Addendum)
You have been seen today for your complaint of cough, rib pain. Your lab work was overall reassuring. Your imaging was reassuring. Showed a hernia of your esophagus and an enlarged aorta which requires yearly monitoring. Your discharge medications include augmentin and doxycycline. This is an antibiotic. You should take it as prescribed. You should take it for the entire duration of the prescription. This may cause an upset stomach. This is normal. You may take this with food. You may also eat yogurt to prevent diarrhea. Follow up with: your primary care doctor in 3 to 4 days Please seek immediate medical care if you develop any of the following symptoms: You have nausea or vomiting. You feel sweaty or light-headed. You have a cough with mucus from your lungs (sputum) or you cough up blood. You develop shortness of breath. At this time there does not appear to be the presence of an emergent medical condition, however there is always the potential for conditions to change. Please read and follow the below instructions.  Do not take your medicine if  develop an itchy rash, swelling in your mouth or lips, or difficulty breathing; call 911 and seek immediate emergency medical attention if this occurs.  You may review your lab tests and imaging results in their entirety on your MyChart account.  Please discuss all results of fully with your primary care provider and other specialist at your follow-up visit.  Note: Portions of this text may have been transcribed using voice recognition software. Every effort was made to ensure accuracy; however, inadvertent computerized transcription errors may still be present.

## 2022-11-18 NOTE — ED Provider Notes (Signed)
[place] EMERGENCY DEPARTMENT AT [facility] Provider Note   CSN: [id number] Arrival date & time: 11/18/22  1653     History  Chief Complaint  Patient presents with   Cough    Dry cough for months   Flank Pain    Tracie Atkins is a 67 y.o. female.  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  She states she has had the cough for months.  He is following with her primary care provider for this.  Does not know the cause of the cough.  No recent change to the cough.  She reports shortness of breath which she has at baseline.  No recent change to this.  She noticed the anterior rib pain this morning after an episode of coughing.  States the pain is present when she takes deep inspirations and coughs as well as any rotational movement and palpation.  She states she finished radiation therapy for her malignancy on 11/05/2022.  She denies hemoptysis, unilateral leg swelling, history of DVT or PE, recent surgery or long distance travel.  Denies chest pain, abdominal pain, nausea, vomiting.  Denies history of smoking. Also endorses congestion and rhinorrhea.   Cough Flank Pain       Home Medications Prior to Admission medications   Medication Sig Start Date End Date Taking? Authorizing Provider  amoxicillin-clavulanate (AUGMENTIN) 875-125 MG tablet Take 1 tablet by mouth every 12 (twelve) hours. 11/18/22  Yes Dylana Shaw, Edsel Petrin, PA-C  doxycycline (VIBRAMYCIN) 100 MG capsule Take 1 capsule (100 mg total) by mouth 2 (two) times daily for 7 days. 11/18/22 11/25/22 Yes Barrie Sigmund, Edsel Petrin, PA-C  anastrozole (ARIMIDEX) 1 MG tablet Take 1 tablet (1 mg total) by mouth daily. 11/07/22   Rachel Moulds, MD  escitalopram (LEXAPRO) 10 MG tablet Take 10 mg by mouth daily. 09/17/22   [provider]  fluticasone (FLONASE) 50 MCG/ACT nasal spray Place 2 sprays  into both nostrils daily. Patient not taking: Reported on 11/17/2022 09/12/22   [provider]  oxyCODONE (ROXICODONE) 5 MG immediate release tablet Take 1 tablet (5 mg total) by mouth every 4 (four) hours as needed for severe pain or breakthrough pain. 10/16/22   Ronny Bacon, PA-C  pantoprazole (PROTONIX) 40 MG tablet Take 40 mg by mouth every morning. 09/17/22   [provider]  topiramate (TOPAMAX) 50 MG tablet Take 50 mg by mouth 2 (two) times daily.    [provider]      Allergies    Patient has no known allergies.    Review of Systems   Review of Systems  Respiratory:  Positive for cough.   All other systems reviewed and are negative.   Physical Exam Updated Vital Signs BP 119/79   Pulse (!) 120   Temp (!) 101.6 F (38.7 C) (Rectal)   Resp (!) 23   Ht 5\' 4"  (1.626 m)   Wt 85 kg   SpO2 98%   BMI 32.17 kg/m  Physical Exam Vitals and nursing note reviewed.  Constitutional:      General: She is not in acute distress.    Appearance: She is well-developed. She is not ill-appearing, toxic-appearing or diaphoretic.     Comments: Resting comfortably in chair  HENT:     Head: Normocephalic and atraumatic.  Eyes:     Conjunctiva/sclera: Conjunctivae normal.  Cardiovascular:  Rate and Rhythm: Normal rate and regular rhythm.     Heart sounds: No murmur heard. Pulmonary:     Effort: Pulmonary effort is normal. No respiratory distress.     Breath sounds: Normal breath sounds. Decreased air movement present. No wheezing, rhonchi or rales.     Comments: Frequent nonproductive cough Abdominal:     Palpations: Abdomen is soft.     Tenderness: There is no abdominal tenderness.  Musculoskeletal:        General: No swelling.     Cervical back: Neck supple.     Right lower leg: No edema.     Left lower leg: No edema.     Comments: Reproducible TTP to the right anterior lower ribs  Skin:    General: Skin is warm and dry.     Capillary  Refill: Capillary refill takes less than 2 seconds.  Neurological:     Mental Status: She is alert.  Psychiatric:        Mood and Affect: Mood normal.     ED Results / Procedures / Treatments   Labs (all labs ordered are listed, but only abnormal results are displayed) Labs Reviewed  BASIC METABOLIC PANEL - Abnormal; Notable for the following components:      Result Value   Glucose, Bld 107 (*)    Calcium 8.8 (*)    All other components within normal limits  CBC WITH DIFFERENTIAL/PLATELET - Abnormal; Notable for the following components:   WBC 3.8 (*)    RDW 16.4 (*)    Lymphs Abs 0.4 (*)    All other components within normal limits    EKG EKG Interpretation  Date/Time:  Tuesday Nov 18 2022 17:45:13 EDT Ventricular Rate:  112 PR Interval:  130 QRS Duration: 74 QT Interval:  328 QTC Calculation: 448 R Axis:   39 Text Interpretation: Sinus tachycardia Left atrial enlargement Since last tracing rate faster Confirmed by Richardean Canal [phone]) on 11/18/2022 7:27:47 PM  Radiology CT Angio Chest PE W/Cm &/Or Wo Cm  Result Date: 11/18/2022 CLINICAL DATA:  Pulmonary embolism (PE) suspected, high prob. Shortness of breath, cough EXAM: CT ANGIOGRAPHY CHEST WITH CONTRAST TECHNIQUE: Multidetector CT imaging of the chest was performed using the standard protocol during bolus administration of intravenous contrast. Multiplanar CT image reconstructions and MIPs were obtained to evaluate the vascular anatomy. RADIATION DOSE REDUCTION: This exam was performed according to the departmental dose-optimization program which includes automated exposure control, adjustment of the mA and/or kV according to patient size and/or use of iterative reconstruction technique. CONTRAST:  80mL OMNIPAQUE IOHEXOL 350 MG/ML SOLN COMPARISON:  10/11/2022.  PET CT 10/20/2022. FINDINGS: Cardiovascular: No filling defects in the pulmonary arteries to suggest pulmonary emboli. Slight aneurysmal dilatation of the ascending  thoracic aorta measuring 4 cm. Scattered aortic calcifications. No dissection. Heart is normal size. Mediastinum/Nodes: No mediastinal, hilar, or axillary adenopathy. Trachea and thyroid unremarkable. Large hiatal hernia. Lungs/Pleura: Mild elevation of the right hemidiaphragm, stable. Improved aeration in the lung bases with minimal residual bibasilar atelectasis. No effusions. Upper Abdomen: No acute findings Musculoskeletal: Postoperative changes in the left breast where there is a soft tissue mass measuring 3.5 x 3.2 cm. This is unchanged since prior study and may reflect postoperative changes/seroma. Recommend correlation recent mammography. Sclerotic lesions throughout the T6 vertebral body is stable. Sclerosis within the posterior elements of T5, T6 and T7 noted as seen on PET CT. Sclerosis within the right scapula also as seen on prior PET CT. Review of  the MIP images confirms the above findings. IMPRESSION: No evidence of pulmonary embolus. 4 cm ascending thoracic aortic aneurysm. Recommend annual imaging followup by CTA or MRA. This recommendation follows 2010 ACCF/AHA/AATS/ACR/ASA/SCA/SCAI/SIR/STS/SVM Guidelines for the Diagnosis and Management of Patients with Thoracic Aortic Disease. Circulation. 2010; 121: Z610-R604. Aortic aneurysm NOS (ICD10-I71.9) Stable mild elevation of the right hemidiaphragm. Improving bibasilar atelectasis. Large hiatal hernia. Sclerotic osseous metastases as seen on prior PET CT. Postoperative changes in the left breast with 3.5 cm masslike area, possibly postoperative seroma. Recommend correlation with past and upcoming mammography. Electronically Signed   By: Charlett Nose M.D.   On: 11/18/2022 19:18    Procedures Procedures    Medications Ordered in ED Medications  iohexol (OMNIPAQUE) 350 MG/ML injection 80 mL (80 mLs Intravenous Contrast Given 11/18/22 1846)  acetaminophen (TYLENOL) tablet 1,000 mg (1,000 mg Oral Given 11/18/22 1941)    ED Course/ Medical Decision  Making/ A&P                             Medical Decision Making Amount and/or Complexity of Data Reviewed Labs: ordered. Radiology: ordered.  Risk OTC drugs. Prescription drug management.  This patient presents to the ED for concern of rib pain, cough, this involves an extensive number of treatment options, and is a complaint that carries with it a high risk of complications and morbidity.  Differential diagnosis for emergent cause of cough includes but is not limited to upper respiratory infection, lower respiratory infection, allergies, asthma, irritants, foreign body, medications such as ACE inhibitors, reflux, asthma, CHF, lung cancer, interstitial lung disease, psychiatric causes, postnasal drip and postinfectious bronchospasm.   Co morbidities that complicate the patient evaluation  malignant neoplasm of the left breast with bone metastases, depression  My initial workup includes  Additional history obtained from: Nursing notes from this visit. Family daughter-in-law is present and provides a portion of the history  I ordered, reviewed and interpreted labs which include: BMP, CBC.  Labs reassuring.  No leukocytosis  I ordered imaging studies including CT PE study I independently visualized and interpreted imaging which showed hiatal hernia, 4 cm aortic aneurysm without evidence of dissection, breast seroma.  Recommend monitoring of aneurysm yearly and seroma with nonemergent mammogram I agree with the radiologist interpretation  Febrile and borderline tachycardic but otherwise hemodynamically stable.  67 year old female presenting to the ED for evaluation of right anterior lower rib pain.  This began shortly after an episode of coughing this morning.  Cough has been present for at least 3 months.  She is following with her primary care provider regarding this.  She is also endorsing congestion and rhinorrhea.  Unclear etiology of her cough, however this may be exacerbated  currently by a viral illness.  PE study negative for PE but does show findings as described above.  No evidence of pneumonia today.  No adventitious breath sounds as well.  Patient will be treated for pneumonia given her status as a cancer patient undergoing radiation, borderline tachycardia and fever in the ED, however this is likely viral in nature.  Rib pain likely secondary to costochondritis from coughing.  She was strongly encouraged to follow-up with her primary care provider in 3 to 4 days for reevaluation of her symptoms.  She was given return precautions.  Stable at discharge.  At this time there does not appear to be any evidence of an acute emergency medical condition and the patient appears stable for discharge  with appropriate outpatient follow up. Diagnosis was discussed with patient who verbalizes understanding of care plan and is agreeable to discharge. I have discussed return precautions with patient and daughter in law who verbalizes understanding. Patient encouraged to follow-up with their PCP within 3 to 4 days. All questions answered.  Patient's case discussed with Dr. Silverio Lay who agrees with plan to discharge with follow-up.   Note: Portions of this report may have been transcribed using voice recognition software. Every effort was made to ensure accuracy; however, inadvertent computerized transcription errors may still be present.        Final Clinical Impression(s) / ED Diagnoses Final diagnoses:  Subacute cough  Rib pain    Rx / DC Orders ED Discharge Orders          Ordered    doxycycline (VIBRAMYCIN) 100 MG capsule  2 times daily        11/18/22 1941    amoxicillin-clavulanate (AUGMENTIN) 875-125 MG tablet  Every 12 hours        11/18/22 1941              Mora Bellman 11/18/22 1947    Charlynne Pander, MD 11/18/22 [phone]

## 2022-11-18 NOTE — ED Notes (Signed)
Patient transported to CT 

## 2022-12-09 ENCOUNTER — Inpatient Hospital Stay: Payer: 59 | Attending: Hematology and Oncology | Admitting: Hematology and Oncology

## 2022-12-09 ENCOUNTER — Telehealth: Payer: Self-pay | Admitting: *Deleted

## 2022-12-09 NOTE — Telephone Encounter (Signed)
This RN attempted to return call to pt per her VM.  Obtained number identified VM- message to call this RN again.

## 2022-12-11 ENCOUNTER — Encounter: Payer: Self-pay | Admitting: Nurse Practitioner

## 2022-12-16 ENCOUNTER — Emergency Department (HOSPITAL_COMMUNITY): Payer: 59

## 2022-12-16 ENCOUNTER — Encounter (HOSPITAL_COMMUNITY): Payer: Self-pay

## 2022-12-16 ENCOUNTER — Other Ambulatory Visit: Payer: Self-pay

## 2022-12-16 ENCOUNTER — Telehealth: Payer: Self-pay

## 2022-12-16 ENCOUNTER — Emergency Department (HOSPITAL_COMMUNITY)
Admission: EM | Admit: 2022-12-16 | Discharge: 2022-12-17 | Disposition: A | Discharge status: Home / Self Care | Payer: 59 | Attending: Emergency Medicine | Admitting: Emergency Medicine

## 2022-12-16 DIAGNOSIS — M5126 Other intervertebral disc displacement, lumbar region: Secondary | ICD-10-CM | POA: Insufficient documentation

## 2022-12-16 DIAGNOSIS — R200 Anesthesia of skin: Secondary | ICD-10-CM | POA: Diagnosis present

## 2022-12-16 LAB — I-STAT CHEM 8, ED
BUN: 10 mg/dL (ref 8–23)
Calcium, Ion: 1.18 mmol/L (ref 1.15–1.40)
Chloride: 106 mmol/L (ref 98–111)
Creatinine, Ser: 0.6 mg/dL (ref 0.44–1.00)
Glucose, Bld: 141 mg/dL — ABNORMAL HIGH (ref 70–99)
HCT: 39 % (ref 36.0–46.0)
Hemoglobin: 13.3 g/dL (ref 12.0–15.0)
Potassium: 3 mmol/L — ABNORMAL LOW (ref 3.5–5.1)
Sodium: 142 mmol/L (ref 135–145)
TCO2: 22 mmol/L (ref 22–32)

## 2022-12-16 MED ORDER — GADOBUTROL 1 MMOL/ML IV SOLN
8.5000 mL | Freq: Once | INTRAVENOUS | Status: AC | PRN
  Administered 2022-12-16: 8.5 mL via INTRAVENOUS

## 2022-12-16 MED ORDER — POTASSIUM CHLORIDE 20 MEQ PO PACK
40.0000 meq | PACK | Freq: Once | ORAL | Status: AC
  Administered 2022-12-16: 40 meq via ORAL
  Filled 2022-12-16: qty 2

## 2022-12-16 NOTE — ED Notes (Signed)
Patient transported to MRI 

## 2022-12-16 NOTE — ED Triage Notes (Signed)
Patient is here for evaluation of right thigh numbness. Reports calling the cancer center today and they informed her to come to the ER. Pt reports the numbness has been going on for over 2 weeks. Has cancer on her spine.

## 2022-12-16 NOTE — ED Provider Notes (Signed)
EMERGENCY DEPARTMENT AT [facility] Provider Note   CSN: [id number] Arrival date & time: 12/16/22  1704     History {Add pertinent medical, surgical, social history, OB history to HPI:1} Chief Complaint  Patient presents with   Numbness    Tracie Atkins is a 67 y.o. female, history of metastatic breast cancer, who presents to the ED secondary to right thigh numbness has been going on for the last 2 weeks.  She states that she called the cancer center today because it was persistent, and was told to come to the ER.  She denies any loss of bowel, bladder, fever, chills.  Does have known thoracic metastasis.  Is on oral chemo pill per patient.     Home Medications Prior to Admission medications   Medication Sig Start Date End Date Taking? Authorizing Provider  amoxicillin-clavulanate (AUGMENTIN) 875-125 MG tablet Take 1 tablet by mouth every 12 (twelve) hours. 11/18/22   Schutt, Edsel Petrin, PA-C  anastrozole (ARIMIDEX) 1 MG tablet Take 1 tablet (1 mg total) by mouth daily. 11/07/22   Rachel Moulds, MD  escitalopram (LEXAPRO) 10 MG tablet Take 10 mg by mouth daily. 09/17/22   [provider]  fluticasone (FLONASE) 50 MCG/ACT nasal spray Place 2 sprays into both nostrils daily. Patient not taking: Reported on 11/17/2022 09/12/22   [provider]  oxyCODONE (ROXICODONE) 5 MG immediate release tablet Take 1 tablet (5 mg total) by mouth every 4 (four) hours as needed for severe pain or breakthrough pain. 10/16/22   Ronny Bacon, PA-C  pantoprazole (PROTONIX) 40 MG tablet Take 40 mg by mouth every morning. 09/17/22   [provider]  topiramate (TOPAMAX) 50 MG tablet Take 50 mg by mouth 2 (two) times daily.    [provider]      Allergies    Patient has no known allergies.    Review of Systems   Review of Systems  Musculoskeletal:  Negative for back pain and gait problem.  Neurological:  Positive for numbness.     Physical Exam Updated Vital Signs BP 135/87 (BP Location: Left Arm)   Pulse 91   Temp 98.4 F (36.9 C) (Oral)   Resp 16   Ht 5\' 4"  (1.626 m)   Wt 84.4 kg   SpO2 97%   BMI 31.93 kg/m  Physical Exam Vitals and nursing note reviewed.  Constitutional:      General: She is not in acute distress.    Appearance: She is well-developed.  HENT:     Head: Normocephalic and atraumatic.  Eyes:     General:        Right eye: No discharge.        Left eye: No discharge.     Conjunctiva/sclera: Conjunctivae normal.  Cardiovascular:     Rate and Rhythm: Normal rate and regular rhythm.     Heart sounds: No murmur heard. Pulmonary:     Effort: Pulmonary effort is normal. No respiratory distress.     Breath sounds: Normal breath sounds.  Abdominal:     Palpations: Abdomen is soft.     Tenderness: There is no abdominal tenderness.  Musculoskeletal:        General: No swelling.     Cervical back: Neck supple.  Skin:    General: Skin is warm and dry.     Capillary Refill: Capillary refill takes less than 2 seconds.  Neurological:     Mental Status: She is alert.  Comments: Clear speech.  Increased pain to right lateral thigh, as well as numbness.  Psychiatric:        Mood and Affect: Mood normal.        Behavior: Behavior normal.        Thought Content: Thought content normal.     ED Results / Procedures / Treatments   Labs (all labs ordered are listed, but only abnormal results are displayed) Labs Reviewed - No data to display  EKG None  Radiology No results found.  Procedures Procedures  {Document cardiac monitor, telemetry assessment procedure when appropriate:1}  Medications Ordered in ED Medications - No data to display  ED Course/ Medical Decision Making/ A&P   {   Click here for ABCD2, HEART and other calculatorsREFRESH Note before signing :1}                          Medical Decision Making Amount and/or Complexity of Data Reviewed Radiology:  ordered.   ***  {Document critical care time when appropriate:1} {Document review of labs and clinical decision tools ie heart score, Chads2Vasc2 etc:1}  {Document your independent review of radiology images, and any outside records:1} {Document your discussion with family members, caretakers, and with consultants:1} {Document social determinants of health affecting pt's care:1} {Document your decision making why or why not admission, treatments were needed:1} Final Clinical Impression(s) / ED Diagnoses Final diagnoses:  None    Rx / DC Orders ED Discharge Orders     None

## 2022-12-16 NOTE — Telephone Encounter (Signed)
Received call from Fuller Mandril, RN at [facility] stating pt called their office attempting to get in touch with a nurse regarding her breast cancer. Pt has not been seen there and she is a pt of [facility] Dr Al Pimple. Pt c/o RLE numbness, back pain and urinary incontinence. Called pt and spoke with her regarding these sx. She was advised to go to ED and she states, "I dont want to call 911 because the police and the fire department will show up too." Pt gave me permission to call her son regarding this. S/w pt's son, Chrissie Noa and explained pt's sx and risk for cord compression and how her sx could be cord compression. He states he will be off work soon and will go to his mom and take her to ED.

## 2022-12-17 NOTE — Discharge Instructions (Signed)
The numbness of your thigh is likely secondary to a pinched nerve, from some narrowing in your spine.  I have referred you to be referred to a neurosurgeon, you declined please follow-up with the primary care doctor.  If you have any loss of bowel, bladder, weakness in your legs please return to the ER.

## 2022-12-18 ENCOUNTER — Telehealth: Payer: Self-pay | Admitting: Internal Medicine

## 2022-12-18 NOTE — Telephone Encounter (Signed)
Patient needed information on upcoming appointments, also printed a reminder to be mailed out.

## 2022-12-19 ENCOUNTER — Other Ambulatory Visit: Payer: Self-pay

## 2022-12-19 ENCOUNTER — Emergency Department (HOSPITAL_COMMUNITY)
Admission: EM | Admit: 2022-12-19 | Discharge: 2022-12-20 | Disposition: A | Discharge status: Other Acute Inpatient Hospital | Payer: 59 | Attending: Emergency Medicine | Admitting: Emergency Medicine

## 2022-12-19 DIAGNOSIS — F10129 Alcohol abuse with intoxication, unspecified: Secondary | ICD-10-CM | POA: Diagnosis not present

## 2022-12-19 DIAGNOSIS — C50919 Malignant neoplasm of unspecified site of unspecified female breast: Secondary | ICD-10-CM | POA: Insufficient documentation

## 2022-12-19 DIAGNOSIS — Z1152 Encounter for screening for COVID-19: Secondary | ICD-10-CM | POA: Insufficient documentation

## 2022-12-19 DIAGNOSIS — R45851 Suicidal ideations: Secondary | ICD-10-CM | POA: Diagnosis not present

## 2022-12-19 DIAGNOSIS — F332 Major depressive disorder, recurrent severe without psychotic features: Secondary | ICD-10-CM | POA: Diagnosis present

## 2022-12-19 DIAGNOSIS — F1014 Alcohol abuse with alcohol-induced mood disorder: Secondary | ICD-10-CM | POA: Diagnosis not present

## 2022-12-19 DIAGNOSIS — Z853 Personal history of malignant neoplasm of breast: Secondary | ICD-10-CM | POA: Insufficient documentation

## 2022-12-19 DIAGNOSIS — F10920 Alcohol use, unspecified with intoxication, uncomplicated: Secondary | ICD-10-CM

## 2022-12-19 DIAGNOSIS — F32A Depression, unspecified: Secondary | ICD-10-CM | POA: Diagnosis present

## 2022-12-19 DIAGNOSIS — Z79899 Other long term (current) drug therapy: Secondary | ICD-10-CM | POA: Insufficient documentation

## 2022-12-19 LAB — COMPREHENSIVE METABOLIC PANEL
ALT: 17 U/L (ref 0–44)
AST: 17 U/L (ref 15–41)
Albumin: 3.5 g/dL (ref 3.5–5.0)
Alkaline Phosphatase: 136 U/L — ABNORMAL HIGH (ref 38–126)
Anion gap: 11 (ref 5–15)
BUN: 16 mg/dL (ref 8–23)
CO2: 21 mmol/L — ABNORMAL LOW (ref 22–32)
Calcium: 8.9 mg/dL (ref 8.9–10.3)
Chloride: 110 mmol/L (ref 98–111)
Creatinine, Ser: 0.77 mg/dL (ref 0.44–1.00)
GFR, Estimated: >60 mL/min (ref >60)
Glucose, Bld: 100 mg/dL — ABNORMAL HIGH (ref 70–99)
Potassium: 3.4 mmol/L — ABNORMAL LOW (ref 3.5–5.1)
Sodium: 142 mmol/L (ref 135–145)
Total Bilirubin: 0.3 mg/dL (ref 0.3–1.2)
Total Protein: 6.6 g/dL (ref 6.5–8.1)

## 2022-12-19 LAB — CBC
HCT: 41.3 % (ref 36.0–46.0)
Hemoglobin: 13.4 g/dL (ref 12.0–15.0)
MCH: 27.8 pg (ref 26.0–34.0)
MCHC: 32.4 g/dL (ref 30.0–36.0)
MCV: 85.7 fL (ref 80.0–100.0)
Platelets: 231 10*3/uL (ref 150–400)
RBC: 4.82 MIL/uL (ref 3.87–5.11)
RDW: 15.4 % (ref 11.5–15.5)
WBC: 5.7 10*3/uL (ref 4.0–10.5)
nRBC: 0 % (ref 0.0–0.2)

## 2022-12-19 LAB — RAPID URINE DRUG SCREEN, HOSP PERFORMED
Amphetamines: NOT DETECTED
Barbiturates: NOT DETECTED
Benzodiazepines: NOT DETECTED
Cocaine: NOT DETECTED
Opiates: NOT DETECTED
Tetrahydrocannabinol: NOT DETECTED

## 2022-12-19 LAB — ACETAMINOPHEN LEVEL: Acetaminophen (Tylenol), Serum: <10 ug/mL — ABNORMAL LOW (ref 10–30)

## 2022-12-19 LAB — SALICYLATE LEVEL: Salicylate Lvl: <7 mg/dL — ABNORMAL LOW (ref 7.0–30.0)

## 2022-12-19 LAB — ETHANOL: Alcohol, Ethyl (B): 127 mg/dL — ABNORMAL HIGH (ref <10)

## 2022-12-19 MED ORDER — PANTOPRAZOLE SODIUM 40 MG PO TBEC
40.0000 mg | DELAYED_RELEASE_TABLET | Freq: Every morning | ORAL | Status: DC
  Administered 2022-12-20: 40 mg via ORAL
  Filled 2022-12-19: qty 1

## 2022-12-19 MED ORDER — ESCITALOPRAM OXALATE 10 MG PO TABS
10.0000 mg | ORAL_TABLET | Freq: Every day | ORAL | Status: DC
  Administered 2022-12-19 – 2022-12-20 (×2): 10 mg via ORAL
  Filled 2022-12-19 (×2): qty 1

## 2022-12-19 MED ORDER — LORAZEPAM 2 MG/ML IJ SOLN: 0.0000 mg | Freq: Four times a day (QID) | INTRAMUSCULAR | Status: DC

## 2022-12-19 MED ORDER — LORAZEPAM 1 MG PO TABS: 0.0000 mg | ORAL_TABLET | Freq: Two times a day (BID) | ORAL | Status: DC

## 2022-12-19 MED ORDER — HYDROXYZINE HCL 25 MG PO TABS: 25.0000 mg | ORAL_TABLET | Freq: Three times a day (TID) | ORAL | Status: DC | PRN

## 2022-12-19 MED ORDER — OXYCODONE HCL 5 MG PO TABS
5.0000 mg | ORAL_TABLET | ORAL | Status: DC | PRN
  Administered 2022-12-20 (×2): 5 mg via ORAL
  Filled 2022-12-19 (×2): qty 1

## 2022-12-19 MED ORDER — ANASTROZOLE 1 MG PO TABS
1.0000 mg | ORAL_TABLET | Freq: Every day | ORAL | Status: DC
  Administered 2022-12-20: 1 mg via ORAL
  Filled 2022-12-19: qty 1

## 2022-12-19 MED ORDER — LORAZEPAM 1 MG PO TABS
0.0000 mg | ORAL_TABLET | Freq: Four times a day (QID) | ORAL | Status: DC
  Administered 2022-12-19: 1 mg via ORAL
  Filled 2022-12-19: qty 1

## 2022-12-19 MED ORDER — THIAMINE MONONITRATE 100 MG PO TABS
100.0000 mg | ORAL_TABLET | Freq: Every day | ORAL | Status: DC
  Administered 2022-12-19 – 2022-12-20 (×2): 100 mg via ORAL
  Filled 2022-12-19 (×2): qty 1

## 2022-12-19 MED ORDER — LORAZEPAM 2 MG/ML IJ SOLN: 0.0000 mg | Freq: Two times a day (BID) | INTRAMUSCULAR | Status: DC

## 2022-12-19 MED ORDER — TOPIRAMATE 25 MG PO TABS
50.0000 mg | ORAL_TABLET | Freq: Two times a day (BID) | ORAL | Status: DC
  Administered 2022-12-19 – 2022-12-20 (×3): 50 mg via ORAL
  Filled 2022-12-19 (×3): qty 2

## 2022-12-19 MED ORDER — THIAMINE HCL 100 MG/ML IJ SOLN: 100.0000 mg | Freq: Every day | INTRAMUSCULAR | Status: DC

## 2022-12-19 NOTE — Progress Notes (Signed)
Pt was accepted to [facility]  12/20/2022; Bed Assignment [place] PENDING Negative COVID  Pt meets inpatient criteria per Alona Bene, PMHNP  Attending Physician will be Dr. Margarita Rana, MD  Report can be called to: - [phone]  Pt can arrive after: 9:00am  Care Team notified:[facility], LCSWA 12/19/2022 @ 11:24 PM

## 2022-12-19 NOTE — Progress Notes (Signed)
LCSW Progress Note  [id number]   [facility] EMDE  12/19/2022  10:28 PM    Inpatient Behavioral Health Placement  Pt meets inpatient criteria per Alona Bene, PMHNP. There are no available beds within [facility] system per [facility] [employer]. Referral was sent to the following facilities;   Destination  Service Provider Address Phone [facility]  [address] [place] [address] [phone]  [facility]  [address] [place] [address] [phone]  [facility]  [address] [place], [place] [address] [phone]  [facility]  [address], [place] [address] [phone]  [facility]  [address], [place] [address] [phone]  [facility]  [address] [place] [address] [phone]  [facility]  [address], [place] [address] [phone]  [facility]  [address], [place] [address] [phone]  [facility]  [address] [place] [address] [phone]  [facility]  [address], [place] [address] [phone]  [facility]  [address] [place] [address] [phone]  [facility]  [address], [place] [address] [phone]  [facility]  [address] [place] [address] [phone]  [facility]  [address] [place] [address] [phone]  [facility]  [phone] [place] [address] [phone]  [facility]  [address] [place]  [address] [phone]  [facility]  [address], [place] [address] [phone]  [facility]  [address] [place] [address] [phone]  [facility]  [address] [place] [address] [phone]  [facility]  [address] [place], [place] [address] [phone]  [facility]  [address] [place] [address] [phone]  [facility]  [address] [place] [address] [phone]  [facility]  [address] [place] [address] [phone]  [facility] [address]  [address], [place] [address] [phone]  [facility]  [address] [place] [address] [phone]  [facility]  [address], [place] [address] [phone]  [facility]  [address] [place] [address] [phone]  [facility]  [address] [place] [address] [phone]  [facility]  [address] [place], [place] [address] [phone]  [facility]  [address] [place], [place] [address] [phone]    Situation ongoing,  CSW will follow up.    Maryjean Ka, MSW, [facility] 12/19/2022 10:28 PM

## 2022-12-19 NOTE — ED Triage Notes (Signed)
Pt brought in by BHRT with c/o feeling suicidal. Pt states that she does not have support from family, resources, nor does she care about her life anymore because she has cancer. Per pt, drank 1/2 pint of vodka today. Pt states she is "gonna take a rope and hang herself."

## 2022-12-19 NOTE — ED Notes (Signed)
Pt has three belonging bags behind the nurses station of [facility]. Pt changed out in [employer].

## 2022-12-19 NOTE — ED Provider Notes (Signed)
[place] EMERGENCY DEPARTMENT AT [facility] Provider Note   CSN: [id number] Arrival date & time: 12/19/22  1302     History  Chief Complaint  Patient presents with   Suicidal    Tracie Atkins is a 67 y.o. female.  HPI   Patient has a history of depression and alcohol use , breast cancer who presents to the ED for evaluation of suicidal ideation and depression.  Patient states that she is depressed about her cancer.  She is not getting any help from her family.  She does not want to live anymore and wants to hang herself.  Patient apparently went to her primary care doctor's office and they sent her to the ED for evaluation of her suicidal ideation and depression.  Home Medications Prior to Admission medications   Medication Sig Start Date End Date Taking? Authorizing Provider  amoxicillin-clavulanate (AUGMENTIN) 875-125 MG tablet Take 1 tablet by mouth every 12 (twelve) hours. 11/18/22   Schutt, Edsel Petrin, PA-C  anastrozole (ARIMIDEX) 1 MG tablet Take 1 tablet (1 mg total) by mouth daily. 11/07/22   Rachel Moulds, MD  escitalopram (LEXAPRO) 10 MG tablet Take 10 mg by mouth daily. 09/17/22   [provider]  fluticasone (FLONASE) 50 MCG/ACT nasal spray Place 2 sprays into both nostrils daily. Patient not taking: Reported on 11/17/2022 09/12/22   [provider]  oxyCODONE (ROXICODONE) 5 MG immediate release tablet Take 1 tablet (5 mg total) by mouth every 4 (four) hours as needed for severe pain or breakthrough pain. 10/16/22   Ronny Bacon, PA-C  pantoprazole (PROTONIX) 40 MG tablet Take 40 mg by mouth every morning. 09/17/22   [provider]  topiramate (TOPAMAX) 50 MG tablet Take 50 mg by mouth 2 (two) times daily.    [provider]      Allergies    Patient has no known allergies.    Review of Systems   Review of Systems  Physical Exam Updated Vital Signs BP (!) 136/105 (BP Location: Right Arm)   Pulse (!) 115    Temp 99.6 F (37.6 C) (Oral)   Resp 18   SpO2 100%  Physical Exam Vitals and nursing note reviewed.  Constitutional:      Appearance: She is well-developed. She is not diaphoretic.  HENT:     Head: Normocephalic and atraumatic.     Right Ear: External ear normal.     Left Ear: External ear normal.  Eyes:     General: No scleral icterus.       Right eye: No discharge.        Left eye: No discharge.     Conjunctiva/sclera: Conjunctivae normal.  Neck:     Trachea: No tracheal deviation.  Cardiovascular:     Rate and Rhythm: Normal rate and regular rhythm.  Pulmonary:     Effort: Pulmonary effort is normal. No respiratory distress.     Breath sounds: Normal breath sounds. No stridor. No wheezing or rales.  Abdominal:     General: Bowel sounds are normal. There is no distension.     Palpations: Abdomen is soft.     Tenderness: There is no abdominal tenderness. There is no guarding or rebound.  Musculoskeletal:        General: No tenderness or deformity.     Cervical back: Neck supple.  Skin:    General: Skin is warm and dry.     Findings: No rash.  Neurological:  General: No focal deficit present.     Mental Status: She is alert.     Cranial Nerves: No cranial nerve deficit, dysarthria or facial asymmetry.     Sensory: No sensory deficit.     Motor: No abnormal muscle tone or seizure activity.     Coordination: Coordination normal.  Psychiatric:        Mood and Affect: Mood is depressed. Affect is tearful.        Speech: Speech is not rapid and pressured.        Behavior: Behavior is not agitated or aggressive.        Thought Content: Thought content includes suicidal ideation.     ED Results / Procedures / Treatments   Labs (all labs ordered are listed, but only abnormal results are displayed) Labs Reviewed  COMPREHENSIVE METABOLIC PANEL - Abnormal; Notable for the following components:      Result Value   Potassium 3.4 (*)    CO2 21 (*)    Glucose, Bld 100 (*)     Alkaline Phosphatase 136 (*)    All other components within normal limits  ETHANOL - Abnormal; Notable for the following components:   Alcohol, Ethyl (B) 127 (*)    All other components within normal limits  SALICYLATE LEVEL - Abnormal; Notable for the following components:   Salicylate Lvl <7.0 (*)    All other components within normal limits  ACETAMINOPHEN LEVEL - Abnormal; Notable for the following components:   Acetaminophen (Tylenol), Serum <10 (*)    All other components within normal limits  CBC  RAPID URINE DRUG SCREEN, HOSP PERFORMED    EKG None  Radiology No results found.  Procedures Procedures    Medications Ordered in ED Medications  thiamine (VITAMIN B1) tablet 100 mg (has no administration in time range)    Or  thiamine (VITAMIN B1) injection 100 mg (has no administration in time range)  LORazepam (ATIVAN) injection 0-4 mg (has no administration in time range)    Or  LORazepam (ATIVAN) tablet 0-4 mg (has no administration in time range)  LORazepam (ATIVAN) injection 0-4 mg (has no administration in time range)    Or  LORazepam (ATIVAN) tablet 0-4 mg (has no administration in time range)  anastrozole (ARIMIDEX) tablet 1 mg (has no administration in time range)  escitalopram (LEXAPRO) tablet 10 mg (has no administration in time range)  oxyCODONE (Oxy IR/ROXICODONE) immediate release tablet 5 mg (has no administration in time range)  pantoprazole (PROTONIX) EC tablet 40 mg (has no administration in time range)  topiramate (TOPAMAX) tablet 50 mg (has no administration in time range)    ED Course/ Medical Decision Making/ A&P Clinical Course as of 12/19/22 1533  Fri Dec 19, 2022  1508 CBC normal.  Alcohol level elevated. [JK]  1525 Metabolic panel shows slight decrease in potassium. [JK]    Clinical Course User Index [JK] Linwood Dibbles, MD                             Medical Decision Making Amount and/or Complexity of Data Reviewed Labs:  ordered.  Risk OTC drugs. Prescription drug management.  Patient presented to the ED for recurrent suicidal ideation.  Patient also noted to have elevated alcohol level.  No signs of any acute medical abnormalities.  Labs are otherwise unremarkable.  Will monitor closely for signs of withdrawal and started on CIWA protocol.  Patient medically cleared for psychiatric evaluation  The patient has been placed in psychiatric observation due to the need to provide a safe environment for the patient while obtaining psychiatric consultation and evaluation, as well as ongoing medical and medication management to treat the patient's condition.  The patient has been placed under full IVC at this time.         Final Clinical Impression(s) / ED Diagnoses Final diagnoses:  Suicidal ideation  Alcoholic intoxication without complication [facility])    Rx / DC Orders ED Discharge Orders     None         Linwood Dibbles, MD 12/19/22 1533

## 2022-12-19 NOTE — Consult Note (Signed)
[facility] ED ASSESSMENT   Reason for Consult:  Psych Consult Referring Physician: Dr. Lynelle Doctor Patient Identification: Tracie Atkins MRN:  [id number] ED Chief Complaint: Severe episode of recurrent major depressive disorder, without psychotic features (HCC)  Diagnosis:  Principal Problem:   Severe episode of recurrent major depressive disorder, without psychotic features (HCC) Active Problems:   Alcohol abuse with alcohol-induced mood disorder (HCC)   Suicidal ideations   ED Assessment Time Calculation: Start Time: 1530 Stop Time: 1555 Total Time in Minutes (Assessment Completion): 25   HPI:  "Tracie Atkins is a 67 y.o. female. Patient has a history of depression and alcohol use , breast cancer who presents to the ED for evaluation of suicidal ideation and depression.  Patient states that she is depressed about her cancer."    Subjective:  Tracie Atkins, 67 y.o., female patient seen face to face by this provider, consulted with Dr. Clovis Riley; and chart reviewed on 12/19/22.  On evaluation Tracie Atkins reports that she is doing "okay."Patient states she is here due to feeling suicidal, due to cancer diagnosis.  Patient continues to endorse suicide ideations, and states "I have the means to do it."When provider asked her what she meant, what was her means she stated "that nobody's business but mine."  Patient states that no one cares for her, she does not want her children (she has 3 sons) to know any of her business, she states to of her sons are no good, "they do not respect me or appreciate me, they do not care if I live or die.  She states her third son works a lot but helps out with her when he can, buys her groceries, and her medicine to help out.  When asked if I could speak with him she stated no.  She states that she has had a cancer diagnosis since May 2023 which started in her breasts, and has now metastasized to her back.  She states she would rather take her own life then to have cancer.   She says that she helped take care of a boyfriend with cancer and had to watch him slowly passed away.  She says she is sick of living with cancer, says her son that she lives with, lets her stay in the bed all day long does not try and help her get out of the bed, he is no help to her.  Patient states that she has a history of depression and anxiety, and she has been admitted to a psychiatric inpatient facility in [place] many years ago.  Patient endorsed using alcohol, states she has recently increased drinking liquor, BAL 127.  Says that she had a history of alcohol abuse, and has recently started back drinking alcohol due to her cancer diagnosis.  Patient UDS is negative.  Patient refuses to talk anymore to provider, states "nobody cares, and I am sick of my life."  During evaluation Tracie Atkins is laying in bed in no acute distress. She is alert, oriented x 3, calm, and cooperative.  Her mood is depressed at times becomes irritable with congruent affect.  She has normal speech, and behavior.  Objectively there is no evidence of psychosis/mania or delusional thinking.  Patient is able to converse coherently, goal directed thoughts, no distractibility, or pre-occupation. She denies self-harm/homicidal ideation, psychosis, and paranoia.  Patient continues to endorse suicidal thoughts, with a plan Per chart review to hang herself.   Past Psychiatric History: Anxiety and depression  Risk  to Self or Others: Risk to Self:  Yes, to hang self Risk to Others:  No  Prior Inpatient Therapy:  Yes  Prior Outpatient Therapy: No    [place] Scale:  Flowsheet Row ED from 12/19/2022 in [facility] Emergency Department at [facility] ED from 12/16/2022 in [facility] Emergency Department at [facility] ED from 11/18/2022 in [facility] Emergency Department at [facility]  C-SSRS RISK CATEGORY High Risk Low Risk No Risk       AIMS:  , , ,  ,   ASAM:    Substance Abuse:     Past  Medical History:  Past Medical History:  Diagnosis Date   Breast cancer (HCC) 2023   left   Chronic dental infection 8/189   multiple teeth removed and Post -op infection.   Depression    ETOH abuse    Fatty liver    Headache     Past Surgical History:  Procedure Laterality Date   BREAST LUMPECTOMY WITH RADIOACTIVE SEED AND SENTINEL LYMPH NODE BIOPSY Left 01/15/2022   Procedure: LEFT BREAST SEED LOCALIZED LUMPECTOMY, LEFT SEED TARGETED AXILLARY LYMPH NODE BIOPSY, LEFT SENTINEL LYMPH NODE BIOPSY;  Surgeon: [name], MD;  Location: [facility] OR;  Service: General;  Laterality: Left;   DENTAL SURGERY     RADIOACTIVE SEED GUIDED EXCISIONAL BREAST BIOPSY Right 01/15/2022   Procedure: RIGHT BREAST SEED LOCALIZED EXCISIONAL BIOPSY;  Surgeon: [name], MD;  Location: [facility] OR;  Service: General;  Laterality: Right;   TUBAL LIGATION     Family History: No family history on file.  Social History:  Social History   Substance and Sexual Activity  Alcohol Use Yes   Alcohol/week: 1.0 standard drink of alcohol   Types: 1 Standard drinks or equivalent per week   Comment: 1 mixed (liquor) drink per week     Social History   Substance and Sexual Activity  Drug Use No    Social History   Socioeconomic History   Marital status: Single    Spouse name: Not on file   Number of children: 3   Years of education: Not on file   Highest education level: Not on file  Occupational History   Not on file  Tobacco Use   Smoking status: Never    Passive exposure: Never   Smokeless tobacco: Never  Vaping Use   Vaping Use: Never used  Substance and Sexual Activity   Alcohol use: Yes    Alcohol/week: 1.0 standard drink of alcohol    Types: 1 Standard drinks or equivalent per week    Comment: 1 mixed (liquor) drink per week   Drug use: No   Sexual activity: Never  Other Topics Concern   Not on file  Social History Narrative   Not on file   Social Determinants of Health   Financial Resource  Strain: Not on file  Food Insecurity: No Food Insecurity (10/16/2022)   Hunger Vital Sign    Worried About Running Out of Food in the Last Year: Never true    Ran Out of Food in the Last Year: Never true  Transportation Needs: Unmet Transportation Needs (07/30/2022)   PRAPARE - Administrator, Civil Service (Medical): Yes    Lack of Transportation (Non-Medical): No  Physical Activity: Not on file  Stress: Not on file  Social Connections: Not on file      Allergies:  No Known Allergies  Labs:  Results for orders placed or performed  during the hospital encounter of 12/19/22 (from the past 48 hour(s))  Rapid urine drug screen (hospital performed)     Status: None   Collection Time: 12/19/22  1:19 PM  Result Value Ref Range   Opiates NONE DETECTED NONE DETECTED   Cocaine NONE DETECTED NONE DETECTED   Benzodiazepines NONE DETECTED NONE DETECTED   Amphetamines NONE DETECTED NONE DETECTED   Tetrahydrocannabinol NONE DETECTED NONE DETECTED   Barbiturates NONE DETECTED NONE DETECTED    Comment: (NOTE) DRUG SCREEN FOR MEDICAL PURPOSES ONLY.  IF CONFIRMATION IS NEEDED FOR ANY PURPOSE, NOTIFY LAB WITHIN 5 DAYS.  LOWEST DETECTABLE LIMITS FOR URINE DRUG SCREEN Drug Class                     Cutoff (ng/mL) Amphetamine and metabolites    1000 Barbiturate and metabolites    200 Benzodiazepine                 200 Opiates and metabolites        300 Cocaine and metabolites        300 THC                            50 Performed at [facility], [address] [place], [place] [address]   Comprehensive metabolic panel     Status: Abnormal   Collection Time: 12/19/22  1:40 PM  Result Value Ref Range   Sodium 142 135 - 145 mmol/L   Potassium 3.4 (L) 3.5 - 5.1 mmol/L   Chloride 110 98 - 111 mmol/L   CO2 21 (L) 22 - 32 mmol/L   Glucose, Bld 100 (H) 70 - 99 mg/dL    Comment: Glucose reference range applies only to samples taken after fasting for at least 8  hours.   BUN 16 8 - 23 mg/dL   Creatinine, Ser 6.04 0.44 - 1.00 mg/dL   Calcium 8.9 8.9 - 54.0 mg/dL   Total Protein 6.6 6.5 - 8.1 g/dL   Albumin 3.5 3.5 - 5.0 g/dL   AST 17 15 - 41 U/L   ALT 17 0 - 44 U/L   Alkaline Phosphatase 136 (H) 38 - 126 U/L   Total Bilirubin 0.3 0.3 - 1.2 mg/dL   GFR, Estimated >98 >11 mL/min    Comment: (NOTE) Calculated using the CKD-EPI Creatinine Equation (2021)    Anion gap 11 5 - 15    Comment: Performed at [facility], [address], [place], [place] [address]  Ethanol     Status: Abnormal   Collection Time: 12/19/22  1:40 PM  Result Value Ref Range   Alcohol, Ethyl (B) 127 (H) <10 mg/dL    Comment: (NOTE) Lowest detectable limit for serum alcohol is 10 mg/dL.  For medical purposes only. Performed at [facility], [address], [place], [place] [address]   Salicylate level     Status: Abnormal   Collection Time: 12/19/22  1:40 PM  Result Value Ref Range   Salicylate Lvl <7.0 (L) 7.0 - 30.0 mg/dL    Comment: Performed at [facility], [address], [place], [place] [address]  Acetaminophen level     Status: Abnormal   Collection Time: 12/19/22  1:40 PM  Result Value Ref Range   Acetaminophen (Tylenol), Serum <10 (L) 10 - 30 ug/mL    Comment: (NOTE) Therapeutic concentrations vary significantly. A range of 10-30 ug/mL  may be an effective concentration for many patients. However, some  are best treated at concentrations outside of this range. Acetaminophen concentrations >150 ug/mL at 4 hours after ingestion  and >50 ug/mL at 12 hours after ingestion are often associated with  toxic reactions.  Performed at [facility], [address], [place], [place] [address]   cbc     Status: None   Collection Time: 12/19/22  1:40 PM  Result Value Ref Range   WBC 5.7 4.0 - 10.5 K/uL   RBC 4.82 3.87 - 5.11 MIL/uL   Hemoglobin 13.4 12.0 - 15.0 g/dL   HCT 60.4 54.0 -  98.1 %   MCV 85.7 80.0 - 100.0 fL   MCH 27.8 26.0 - 34.0 pg   MCHC 32.4 30.0 - 36.0 g/dL   RDW 19.1 47.8 - 29.5 %   Platelets 231 150 - 400 K/uL   nRBC 0.0 0.0 - 0.2 %    Comment: Performed at [facility], [address] [place], [place] [address]    Current Facility-Administered Medications  Medication Dose Route Frequency Provider Last Rate Last Admin   anastrozole (ARIMIDEX) tablet 1 mg  1 mg Oral Daily Linwood Dibbles, MD       escitalopram (LEXAPRO) tablet 10 mg  10 mg Oral Daily Linwood Dibbles, MD       LORazepam (ATIVAN) injection 0-4 mg  0-4 mg Intravenous Q6H Linwood Dibbles, MD       Or   LORazepam (ATIVAN) tablet 0-4 mg  0-4 mg Oral Q6H Linwood Dibbles, MD       [START ON 12/21/2022] LORazepam (ATIVAN) injection 0-4 mg  0-4 mg Intravenous Penne Lash, MD       Or   Melene Muller ON 12/21/2022] LORazepam (ATIVAN) tablet 0-4 mg  0-4 mg Oral Q12H Linwood Dibbles, MD       oxyCODONE (Oxy IR/ROXICODONE) immediate release tablet 5 mg  5 mg Oral Q4H PRN Linwood Dibbles, MD       Melene Muller ON 12/20/2022] pantoprazole (PROTONIX) EC tablet 40 mg  40 mg Oral q morning Linwood Dibbles, MD       thiamine (VITAMIN B1) tablet 100 mg  100 mg Oral Daily Linwood Dibbles, MD       Or   thiamine (VITAMIN B1) injection 100 mg  100 mg Intravenous Daily Linwood Dibbles, MD       topiramate (TOPAMAX) tablet 50 mg  50 mg Oral BID Linwood Dibbles, MD       Current Outpatient Medications  Medication Sig Dispense Refill   amoxicillin-clavulanate (AUGMENTIN) 875-125 MG tablet Take 1 tablet by mouth every 12 (twelve) hours. 14 tablet 0   anastrozole (ARIMIDEX) 1 MG tablet Take 1 tablet (1 mg total) by mouth daily. 90 tablet 3   escitalopram (LEXAPRO) 10 MG tablet Take 10 mg by mouth daily.     fluticasone (FLONASE) 50 MCG/ACT nasal spray Place 2 sprays into both nostrils daily. (Patient not taking: Reported on 11/17/2022)     oxyCODONE (ROXICODONE) 5 MG immediate release tablet Take 1 tablet (5 mg total) by mouth every 4 (four) hours as  needed for severe pain or breakthrough pain. 60 tablet 0   pantoprazole (PROTONIX) 40 MG tablet Take 40 mg by mouth every morning.     topiramate (TOPAMAX) 50 MG tablet Take 50 mg by mouth 2 (two) times daily.      Musculoskeletal: Strength & Muscle Tone: within normal limits Gait & Station: normal Patient leans:  N/A   Psychiatric Specialty Exam: Presentation  General Appearance:  Disheveled  Eye Contact: Fleeting  Speech: Clear and Coherent  Speech Volume: Decreased  Handedness: Right   Mood and Affect  Mood: Depressed; Hopeless; Irritable; Dysphoric  Affect: Depressed; Constricted; Flat   Thought Process  Thought Processes: Coherent  Descriptions of Associations:Intact  Orientation:Full (Time, Place and Person)  Thought Content:WDL  History of Schizophrenia/Schizoaffective disorder:No data recorded Duration of Psychotic Symptoms:No data recorded Hallucinations:Hallucinations: None  Ideas of Reference:None  Suicidal Thoughts:Suicidal Thoughts: Yes, Passive  Homicidal Thoughts:Homicidal Thoughts: No   Sensorium  Memory: Immediate Good; Recent Good; Remote Fair  Judgment: Fair  Insight: Fair   Art therapist  Concentration: Fair  Attention Span: Fair  Recall: Fair  Fund of Knowledge: Fair  Language: Good   Psychomotor Activity  Psychomotor Activity: Psychomotor Activity: Normal   Assets  Assets: Communication Skills; Social Support; Housing; Desire for Improvement    Sleep  Sleep: Sleep: Fair   Physical Exam: Physical Exam Vitals and nursing note reviewed. Exam conducted with a chaperone present.  Musculoskeletal:        General: Normal range of motion.  Psychiatric:        Attention and Perception: Attention normal.        Mood and Affect: Mood is anxious and depressed. Affect is flat.        Speech: Speech normal.        Behavior: Behavior is agitated.        Thought Content: Thought content includes  suicidal ideation. Thought content includes suicidal plan.        Cognition and Memory: Memory normal.        Judgment: Judgment is impulsive and inappropriate.    Review of Systems  Constitutional: Negative.   Musculoskeletal: Negative.   Psychiatric/Behavioral:  Positive for depression, substance abuse and suicidal ideas.    Blood pressure 100/69, pulse 96, temperature 98.5 F (36.9 C), temperature source Oral, resp. rate 16, SpO2 93 %. There is no height or weight on file to calculate BMI.   Medical Decision Making: Pt case reviewed and discussed with Dr. Clovis Riley. Pt does meet criteria for IVC and inpatient psychiatric treatment. Patient needs inpatient psychiatric admission for stabilization and treatment. [facility] and CSW notified of disposition. Will restart lexapro 10 mg. EDP, RN, and LCSW notified of disposition.    Disposition: Recommend psychiatric Inpatient admission.   Alona Bene, PMHNP 12/19/2022 4:04 PM

## 2022-12-20 ENCOUNTER — Encounter (HOSPITAL_COMMUNITY): Payer: Self-pay | Admitting: Emergency Medicine

## 2022-12-20 ENCOUNTER — Other Ambulatory Visit: Payer: Self-pay

## 2022-12-20 DIAGNOSIS — F332 Major depressive disorder, recurrent severe without psychotic features: Secondary | ICD-10-CM | POA: Diagnosis not present

## 2022-12-20 LAB — SARS CORONAVIRUS 2 BY RT PCR: SARS Coronavirus 2 by RT PCR: NEGATIVE

## 2022-12-20 MED ORDER — ACETAMINOPHEN 500 MG PO TABS
1000.0000 mg | ORAL_TABLET | Freq: Once | ORAL | Status: AC
  Administered 2022-12-20: 1000 mg via ORAL
  Filled 2022-12-20: qty 2

## 2022-12-20 NOTE — ED Notes (Signed)
Patient discharged off unit to facility per provider. Patient alert, calm, cooperative, no s/s of distress at this time. Patient discharge information and belongings given to Sheriff for transport. Patient ambulatory off unit, escorted and transported by Sheriff.  

## 2022-12-20 NOTE — Progress Notes (Signed)
This CSW sent negative COVID results to [facility] at the start of 1st shift via EPIC.   CSW received a phone call from [facility] [employer]  [phone].  At 1202 this CSW communicated with assigned nurse Lum Babe, RN to follow up on transfer.    Pt was accepted to [facility] TODAY 12/20/2022; Bed Assignment [facility] unit  Pt meets inpatient criteria per Alona Bene, PMHNP  Attending Physician will be Dr. Margarita Rana, MD  Report can be called to: - [phone]  Pt can arrive after: BED IS READY  Care Team notified:Beth Eduardo [facility], PMHNP  Maryjean Ka, MSW, [facility] 12/20/2022 12:28 PM

## 2022-12-20 NOTE — ED Provider Notes (Signed)
Emergency Medicine Observation Re-evaluation Note  Tracie Atkins is a 67 y.o. female, seen on rounds today.  Pt initially presented to the ED for complaints of Suicidal Hx of breast cancer, depression. Currently, the patient is awaiting inpatient psychiatric treatment. Sleeping.   Physical Exam  BP (!) 146/92 (BP Location: Left Arm)   Pulse 88   Temp 97.7 F (36.5 C) (Oral)   Resp 20   SpO2 98%  Physical Exam General: NAD Cardiac: RR Lungs: even, unlabored Psych: NAD  ED Course / MDM  EKG:   I have reviewed the labs performed to date as well as medications administered while in observation.  Recent changes in the last 24 hours include none.  Plan  Current plan is for inpatient psychiatric care.    Alvira Monday, MD 12/20/22 2231

## 2022-12-20 NOTE — ED Notes (Signed)
Pt is taking a shower.

## 2022-12-20 NOTE — ED Notes (Signed)
Pt slept throughout the night. Pt alert and cooperative. Pt adhered to medication schedule.

## 2022-12-20 NOTE — ED Provider Notes (Signed)
Dr. Zada Finders accepts the patient to [facility].  Hemodynamically stable throughout my care.   Virgina Norfolk, DO 12/20/22 [phone]

## 2023-01-06 NOTE — Progress Notes (Signed)
  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   Symptoms of fatigue have improved since completing therapy.  Symptoms of skin changes have improved since completing therapy.  Patient's current complain is mid/upper back pain 4/10 x1 wk.  The patient was encouraged to avoid sun exposure in the area of prior treatment for up to one year following radiation with either sunscreen or by the style of clothing worn in the sun.  The patient has scheduled follow up with her medical oncologist Dr. Al Pimple for ongoing surveillance, and was encouraged to call if she develops concerns or questions regarding radiation.   This concludes the interaction.  Ruel Favors, LPN

## 2023-01-08 ENCOUNTER — Emergency Department (HOSPITAL_COMMUNITY)
Admission: EM | Admit: 2023-01-08 | Discharge: 2023-01-10 | Disposition: A | Discharge status: Other Acute Inpatient Hospital | Payer: 59 | Attending: Emergency Medicine | Admitting: Emergency Medicine

## 2023-01-08 ENCOUNTER — Encounter (HOSPITAL_COMMUNITY): Payer: Self-pay | Admitting: Emergency Medicine

## 2023-01-08 ENCOUNTER — Other Ambulatory Visit: Payer: Self-pay

## 2023-01-08 DIAGNOSIS — E876 Hypokalemia: Secondary | ICD-10-CM | POA: Diagnosis not present

## 2023-01-08 DIAGNOSIS — Z853 Personal history of malignant neoplasm of breast: Secondary | ICD-10-CM | POA: Diagnosis not present

## 2023-01-08 DIAGNOSIS — T50902A Poisoning by unspecified drugs, medicaments and biological substances, intentional self-harm, initial encounter: Secondary | ICD-10-CM

## 2023-01-08 DIAGNOSIS — F10920 Alcohol use, unspecified with intoxication, uncomplicated: Secondary | ICD-10-CM | POA: Diagnosis not present

## 2023-01-08 DIAGNOSIS — Y906 Blood alcohol level of 120-199 mg/100 ml: Secondary | ICD-10-CM | POA: Diagnosis not present

## 2023-01-08 DIAGNOSIS — T1491XA Suicide attempt, initial encounter: Secondary | ICD-10-CM

## 2023-01-08 DIAGNOSIS — T43292A Poisoning by other antidepressants, intentional self-harm, initial encounter: Secondary | ICD-10-CM | POA: Diagnosis not present

## 2023-01-08 DIAGNOSIS — F332 Major depressive disorder, recurrent severe without psychotic features: Secondary | ICD-10-CM | POA: Diagnosis not present

## 2023-01-08 DIAGNOSIS — T402X2A Poisoning by other opioids, intentional self-harm, initial encounter: Secondary | ICD-10-CM | POA: Insufficient documentation

## 2023-01-08 DIAGNOSIS — R45851 Suicidal ideations: Secondary | ICD-10-CM

## 2023-01-08 DIAGNOSIS — X838XXA Intentional self-harm by other specified means, initial encounter: Secondary | ICD-10-CM | POA: Diagnosis not present

## 2023-01-08 LAB — CBG MONITORING, ED: Glucose-Capillary: 140 mg/dL — ABNORMAL HIGH (ref 70–99)

## 2023-01-08 LAB — CBC WITH DIFFERENTIAL/PLATELET
Abs Immature Granulocytes: 0.01 10*3/uL (ref 0.00–0.07)
Basophils Absolute: 0 10*3/uL (ref 0.0–0.1)
Basophils Relative: 0 %
Eosinophils Absolute: 0.1 10*3/uL (ref 0.0–0.5)
Eosinophils Relative: 1 %
HCT: 39.7 % (ref 36.0–46.0)
Hemoglobin: 12.6 g/dL (ref 12.0–15.0)
Immature Granulocytes: 0 %
Lymphocytes Relative: 16 %
Lymphs Abs: 0.8 10*3/uL (ref 0.7–4.0)
MCH: 27.3 pg (ref 26.0–34.0)
MCHC: 31.7 g/dL (ref 30.0–36.0)
MCV: 86.1 fL (ref 80.0–100.0)
Monocytes Absolute: 0.5 10*3/uL (ref 0.1–1.0)
Monocytes Relative: 10 %
Neutro Abs: 3.6 10*3/uL (ref 1.7–7.7)
Neutrophils Relative %: 73 %
Platelets: 207 10*3/uL (ref 150–400)
RBC: 4.61 MIL/uL (ref 3.87–5.11)
RDW: 15.4 % (ref 11.5–15.5)
WBC: 5 10*3/uL (ref 4.0–10.5)
nRBC: 0 % (ref 0.0–0.2)

## 2023-01-08 NOTE — ED Triage Notes (Signed)
Pt BIBA Per EMS: Pt coming from home w/ c/o ingestion of home pills and ETOH w/ SI attempt. Pt took unknown amount of topirimate, lexapro, oxycodone and trazodone. Breast CA and spine CA. Denies CA treatment at this time.   Unresponsive upon EMS arrival but became A&Ox4 en route.  123 CBG  119/61 BP  Refusing care en route.  Poison control has been contacted

## 2023-01-08 NOTE — ED Notes (Addendum)
Per poison control:  Monitor acidosis BMP now, repeat BMP in 6 hours Tylenol level now, repeat 2 hours Monitor for seizures & respiratory depression.   Monitor for bradycardia and hypotension

## 2023-01-09 DIAGNOSIS — F332 Major depressive disorder, recurrent severe without psychotic features: Secondary | ICD-10-CM

## 2023-01-09 DIAGNOSIS — T402X2A Poisoning by other opioids, intentional self-harm, initial encounter: Secondary | ICD-10-CM | POA: Diagnosis not present

## 2023-01-09 LAB — ACETAMINOPHEN LEVEL: Acetaminophen (Tylenol), Serum: <10 ug/mL — ABNORMAL LOW (ref 10–30)

## 2023-01-09 LAB — COMPREHENSIVE METABOLIC PANEL
ALT: 17 U/L (ref 0–44)
ALT: 18 U/L (ref 0–44)
AST: 17 U/L (ref 15–41)
AST: 19 U/L (ref 15–41)
Albumin: 3.4 g/dL — ABNORMAL LOW (ref 3.5–5.0)
Albumin: 3.4 g/dL — ABNORMAL LOW (ref 3.5–5.0)
Alkaline Phosphatase: 127 U/L — ABNORMAL HIGH (ref 38–126)
Alkaline Phosphatase: 130 U/L — ABNORMAL HIGH (ref 38–126)
Anion gap: 11 (ref 5–15)
Anion gap: 9 (ref 5–15)
BUN: 9 mg/dL (ref 8–23)
BUN: 9 mg/dL (ref 8–23)
CO2: 20 mmol/L — ABNORMAL LOW (ref 22–32)
CO2: 21 mmol/L — ABNORMAL LOW (ref 22–32)
Calcium: 8 mg/dL — ABNORMAL LOW (ref 8.9–10.3)
Calcium: 8.6 mg/dL — ABNORMAL LOW (ref 8.9–10.3)
Chloride: 112 mmol/L — ABNORMAL HIGH (ref 98–111)
Chloride: 112 mmol/L — ABNORMAL HIGH (ref 98–111)
Creatinine, Ser: 0.52 mg/dL (ref 0.44–1.00)
Creatinine, Ser: 0.56 mg/dL (ref 0.44–1.00)
GFR, Estimated: >60 mL/min (ref >60)
GFR, Estimated: >60 mL/min (ref >60)
Glucose, Bld: 102 mg/dL — ABNORMAL HIGH (ref 70–99)
Glucose, Bld: 130 mg/dL — ABNORMAL HIGH (ref 70–99)
Potassium: 2.6 mmol/L — CL (ref 3.5–5.1)
Potassium: 3.6 mmol/L (ref 3.5–5.1)
Sodium: 142 mmol/L (ref 135–145)
Sodium: 143 mmol/L (ref 135–145)
Total Bilirubin: 0.4 mg/dL (ref 0.3–1.2)
Total Bilirubin: <0.1 mg/dL — ABNORMAL LOW (ref 0.3–1.2)
Total Protein: 6.2 g/dL — ABNORMAL LOW (ref 6.5–8.1)
Total Protein: 6.2 g/dL — ABNORMAL LOW (ref 6.5–8.1)

## 2023-01-09 LAB — RAPID URINE DRUG SCREEN, HOSP PERFORMED
Amphetamines: NOT DETECTED
Barbiturates: NOT DETECTED
Benzodiazepines: NOT DETECTED
Cocaine: NOT DETECTED
Opiates: NOT DETECTED
Tetrahydrocannabinol: NOT DETECTED

## 2023-01-09 LAB — POTASSIUM: Potassium: 2.7 mmol/L — CL (ref 3.5–5.1)

## 2023-01-09 LAB — ETHANOL: Alcohol, Ethyl (B): 141 mg/dL — ABNORMAL HIGH (ref <10)

## 2023-01-09 LAB — SALICYLATE LEVEL: Salicylate Lvl: <7 mg/dL — ABNORMAL LOW (ref 7.0–30.0)

## 2023-01-09 MED ORDER — POTASSIUM CHLORIDE CRYS ER 20 MEQ PO TBCR
80.0000 meq | EXTENDED_RELEASE_TABLET | Freq: Once | ORAL | Status: AC
  Administered 2023-01-09: 80 meq via ORAL
  Filled 2023-01-09: qty 4

## 2023-01-09 MED ORDER — TOPIRAMATE 25 MG PO TABS
50.0000 mg | ORAL_TABLET | Freq: Two times a day (BID) | ORAL | Status: DC
  Administered 2023-01-09 – 2023-01-10 (×3): 50 mg via ORAL
  Filled 2023-01-09 (×3): qty 2

## 2023-01-09 MED ORDER — MAGNESIUM SULFATE 2 GM/50ML IV SOLN
2.0000 g | Freq: Once | INTRAVENOUS | Status: AC
  Administered 2023-01-09: 2 g via INTRAVENOUS
  Filled 2023-01-09: qty 50

## 2023-01-09 MED ORDER — PANTOPRAZOLE SODIUM 40 MG PO TBEC: 40.0000 mg | DELAYED_RELEASE_TABLET | Freq: Every day | ORAL | Status: DC

## 2023-01-09 MED ORDER — TRAZODONE HCL 100 MG PO TABS
50.0000 mg | ORAL_TABLET | Freq: Every evening | ORAL | Status: DC | PRN
  Administered 2023-01-09: 50 mg via ORAL
  Filled 2023-01-09: qty 1

## 2023-01-09 MED ORDER — THIAMINE HCL 100 MG/ML IJ SOLN
100.0000 mg | Freq: Once | INTRAMUSCULAR | Status: AC
  Administered 2023-01-09: 100 mg via INTRAVENOUS
  Filled 2023-01-09: qty 2

## 2023-01-09 MED ORDER — ESCITALOPRAM OXALATE 10 MG PO TABS
20.0000 mg | ORAL_TABLET | Freq: Every day | ORAL | Status: DC
  Administered 2023-01-09 – 2023-01-10 (×2): 20 mg via ORAL
  Filled 2023-01-09 (×2): qty 2

## 2023-01-09 MED ORDER — ACETAMINOPHEN 325 MG PO TABS
650.0000 mg | ORAL_TABLET | Freq: Once | ORAL | Status: AC
  Administered 2023-01-09: 650 mg via ORAL
  Filled 2023-01-09: qty 2

## 2023-01-09 NOTE — ED Provider Notes (Signed)
[place] [facility] AT [facility] Provider Note   CSN: [id number] Arrival date & time: 01/08/23  2329     History  Chief Complaint  Patient presents with   Ingestion   Alcohol Intoxication    Tracie Atkins is a 67 y.o. female.  The history is provided by the patient and the EMS personnel. The history is limited by the condition of the patient.  Ingestion This is a new problem. The problem occurs constantly. The problem has not changed since onset.Nothing aggravates the symptoms. Nothing relieves the symptoms. She has tried nothing for the symptoms. The treatment provided no relief.  Patient with depression and ETOH abuse presents with alcohol intoxication and intentional overdose on multiple medications:  oxycodone, trazedone, topamax.  She will not say what time or how many taken.      Past Medical History:  Diagnosis Date   Breast cancer (HCC) 2023   left   Chronic dental infection 8/189   multiple teeth removed and Post -op infection.   Depression    ETOH abuse    Fatty liver    Headache      Home Medications Prior to Admission medications   Medication Sig Start Date End Date Taking? Authorizing Provider  amoxicillin-clavulanate (AUGMENTIN) 875-125 MG tablet Take 1 tablet by mouth every 12 (twelve) hours. Patient not taking: Reported on 12/19/2022 11/18/22   Michelle Piper, PA-C  anastrozole (ARIMIDEX) 1 MG tablet Take 1 tablet (1 mg total) by mouth daily. 11/07/22   Rachel Moulds, MD  diphenhydrAMINE (BENADRYL) 25 mg capsule Take 25 mg by mouth every 6 (six) hours as needed for itching or allergies.    [provider]  escitalopram (LEXAPRO) 10 MG tablet Take 10 mg by mouth daily.    [provider]  fluticasone (FLONASE) 50 MCG/ACT nasal spray Place 2 sprays into both nostrils daily as needed for allergies or rhinitis. 09/12/22   [provider]  ketoconazole (NIZORAL) 2 % shampoo Apply 1 Application topically See  admin instructions. APPLY TO WET HAIR AND RINSE THOROUGHLY UP TO 3 TO 4 DAYS FOR UP TO 8 WEEKS 10/17/22   [provider]  NYQUIL COUGH DM + CONGESTION 5-6.25-10 MG/15ML LIQD Take 15-30 mLs by mouth at bedtime.    [provider]  oxyCODONE (ROXICODONE) 5 MG immediate release tablet Take 1 tablet (5 mg total) by mouth every 4 (four) hours as needed for severe pain or breakthrough pain. 10/16/22   Ronny Bacon, PA-C  pantoprazole (PROTONIX) 40 MG tablet Take 40 mg by mouth at bedtime. 09/17/22   [provider]  topiramate (TOPAMAX) 50 MG tablet Take 50 mg by mouth 2 (two) times daily.    [provider]      Allergies    Patient has no known allergies.    Review of Systems   Review of Systems  Constitutional:  Negative for fever.  HENT:  Negative for facial swelling.   Respiratory:  Negative for wheezing and stridor.   Gastrointestinal:  Negative for vomiting.  Psychiatric/Behavioral:  Positive for self-injury and suicidal ideas.   All other systems reviewed and are negative.   Physical Exam Updated Vital Signs BP (!) 141/86   Pulse 90   Temp 98.3 F (36.8 C) (Oral)   Resp 19   SpO2 94%  Physical Exam Vitals and nursing note reviewed.  Constitutional:      General: She is not in acute distress.    Appearance: She  is well-developed.  HENT:     Head: Normocephalic and atraumatic.  Eyes:     Pupils: Pupils are equal, round, and reactive to light.  Cardiovascular:     Rate and Rhythm: Normal rate and regular rhythm.     Pulses: Normal pulses.     Heart sounds: Normal heart sounds.  Pulmonary:     Effort: Pulmonary effort is normal. No respiratory distress.     Breath sounds: Normal breath sounds.  Abdominal:     General: Bowel sounds are normal. There is no distension.     Palpations: Abdomen is soft.     Tenderness: There is no abdominal tenderness. There is no guarding or rebound.  Genitourinary:    Vagina: No vaginal  discharge.  Musculoskeletal:        General: Normal range of motion.     Cervical back: Neck supple.  Skin:    General: Skin is warm and dry.     Capillary Refill: Capillary refill takes less than 2 seconds.     Findings: No erythema or rash.  Neurological:     General: No focal deficit present.     Deep Tendon Reflexes: Reflexes normal.  Psychiatric:        Thought Content: Thought content includes suicidal ideation. Thought content includes suicidal plan.     ED Results / Procedures / Treatments   Labs (all labs ordered are listed, but only abnormal results are displayed) Results for orders placed or performed during the hospital encounter of 01/08/23  Comprehensive metabolic panel  Result Value Ref Range   Sodium 143 135 - 145 mmol/L   Potassium 2.6 (LL) 3.5 - 5.1 mmol/L   Chloride 112 (H) 98 - 111 mmol/L   CO2 20 (L) 22 - 32 mmol/L   Glucose, Bld 130 (H) 70 - 99 mg/dL   BUN 9 8 - 23 mg/dL   Creatinine, Ser 1.61 0.44 - 1.00 mg/dL   Calcium 8.6 (L) 8.9 - 10.3 mg/dL   Total Protein 6.2 (L) 6.5 - 8.1 g/dL   Albumin 3.4 (L) 3.5 - 5.0 g/dL   AST 17 15 - 41 U/L   ALT 17 0 - 44 U/L   Alkaline Phosphatase 127 (H) 38 - 126 U/L   Total Bilirubin <0.1 (L) 0.3 - 1.2 mg/dL   GFR, Estimated >09 >60 mL/min   Anion gap 11 5 - 15  Salicylate level  Result Value Ref Range   Salicylate Lvl <7.0 (L) 7.0 - 30.0 mg/dL  Acetaminophen level  Result Value Ref Range   Acetaminophen (Tylenol), Serum <10 (L) 10 - 30 ug/mL  Ethanol  Result Value Ref Range   Alcohol, Ethyl (B) 141 (H) <10 mg/dL  Urine rapid drug screen (hosp performed)  Result Value Ref Range   Opiates NONE DETECTED NONE DETECTED   Cocaine NONE DETECTED NONE DETECTED   Benzodiazepines NONE DETECTED NONE DETECTED   Amphetamines NONE DETECTED NONE DETECTED   Tetrahydrocannabinol NONE DETECTED NONE DETECTED   Barbiturates NONE DETECTED NONE DETECTED  CBC WITH DIFFERENTIAL  Result Value Ref Range   WBC 5.0 4.0 - 10.5 K/uL    RBC 4.61 3.87 - 5.11 MIL/uL   Hemoglobin 12.6 12.0 - 15.0 g/dL   HCT 45.4 09.8 - 11.9 %   MCV 86.1 80.0 - 100.0 fL   MCH 27.3 26.0 - 34.0 pg   MCHC 31.7 30.0 - 36.0 g/dL   RDW 14.7 82.9 - 56.2 %   Platelets 207 150 - 400 K/uL  nRBC 0.0 0.0 - 0.2 %   Neutrophils Relative % 73 %   Neutro Abs 3.6 1.7 - 7.7 K/uL   Lymphocytes Relative 16 %   Lymphs Abs 0.8 0.7 - 4.0 K/uL   Monocytes Relative 10 %   Monocytes Absolute 0.5 0.1 - 1.0 K/uL   Eosinophils Relative 1 %   Eosinophils Absolute 0.1 0.0 - 0.5 K/uL   Basophils Relative 0 %   Basophils Absolute 0.0 0.0 - 0.1 K/uL   Immature Granulocytes 0 %   Abs Immature Granulocytes 0.01 0.00 - 0.07 K/uL  Potassium  Result Value Ref Range   Potassium 2.7 (LL) 3.5 - 5.1 mmol/L  CBG monitoring, ED  Result Value Ref Range   Glucose-Capillary 140 (H) 70 - 99 mg/dL   MR Lumbar Spine W Wo Contrast  Result Date: 12/16/2022 CLINICAL DATA:  Initial evaluation for right thigh numbness. EXAM: MRI LUMBAR SPINE WITHOUT AND WITH CONTRAST TECHNIQUE: Multiplanar and multiecho pulse sequences of the lumbar spine were obtained without and with intravenous contrast. CONTRAST:  8.46mL GADAVIST GADOBUTROL 1 MMOL/ML IV SOLN COMPARISON:  None Available. FINDINGS: Segmentation: Standard. Lowest well-formed disc space labeled the L5-S1 level. Alignment: 3 mm facet mediated anterolisthesis of L4 on L5. Trace degenerative retrolisthesis of L3 on L4. Vertebrae: Mild chronic height loss at the superior endplate of L3. Vertebral body height otherwise maintained with no acute or recent fracture. Bone marrow signal intensity within normal limits. No worrisome osseous lesions or evidence for osseous metastatic disease within the lumbar spine. No evidence for osteomyelitis discitis or septic arthritis. Conus medullaris and cauda equina: Conus extends to the L1 level. Conus and cauda equina appear normal. Paraspinal and other soft tissues: Paraspinous soft tissues within normal  limits. 1.6 cm cyst extending from the upper pole the left kidney with internal layering material, benign in appearance, no follow-up imaging recommended. Disc levels: L1-2:  Unremarkable. L2-3: Degenerative intervertebral disc space narrowing with diffuse disc bulge with disc desiccation. Superimposed broad-based right extraforaminal disc protrusion with reactive endplate spurring (series 4, image 15). This closely approximates and could potentially irritate the exiting right L2 nerve root. Mild facet hypertrophy. No significant spinal stenosis. Foramina remain patent. L3-4: Mild diffuse disc bulge with disc desiccation. Superimposed right foraminal to extraforaminal disc protrusion, closely approximating the exiting right L3 nerve root. Mild facet hypertrophy. No spinal stenosis. Foramina remain adequately patent. L4-5: Degenerative vertebral disc spacing with trace anterolisthesis. Mild disc bulge with small biforaminal annular fissures. Mild endplate spurring. Moderate bilateral facet hypertrophy. No significant spinal stenosis. Mild bilateral L4 foraminal narrowing. L5-S1: Degenerative intervertebral disc space narrowing with diffuse disc bulge and disc desiccation. Superimposed small central disc protrusion with slight inferior migration. Protruding disc closely approximates the descending S1 nerve roots, greater on the right. Mild facet hypertrophy. No significant spinal stenosis. No more than mild bilateral L5 foraminal narrowing. IMPRESSION: 1. No evidence for metastatic disease within the lumbar spine. 2. Right foraminal to extraforaminal disc protrusions at L2-3 and L3-4, potentially affecting the exiting right L2 or L3 nerve roots respectively. 3. Small central disc protrusion at L5-S1, closely approximating and potentially irritating either of the descending S1 nerve roots, greater on the right. Electronically Signed   By: Rise Mu M.D.   On: 12/16/2022 23:45   MR THORACIC SPINE W WO  CONTRAST  Result Date: 12/16/2022 CLINICAL DATA:  Initial evaluation for right thigh numbness. EXAM: MRI THORACIC WITHOUT AND WITH CONTRAST TECHNIQUE: Multiplanar and multiecho pulse sequences of the thoracic spine  were obtained without and with intravenous contrast. CONTRAST:  8.40mL GADAVIST GADOBUTROL 1 MMOL/ML IV SOLN COMPARISON:  None Available. FINDINGS: Alignment: Mild dextroscoliosis. Alignment otherwise normal preservation of the normal thoracic kyphosis. No listhesis. Vertebrae: Osseous metastases involving the T3, T4, and T5 vertebral bodies are seen. Involvement of the posterior elements at T4 and T5. Additional osseous metastases seen involving the posterior elements of T6. Osseous metastatic lesion centered at the left pedicle of T7 noted. Mild chronic compression deformity of T3. No other pathologic compression fracture or extraosseous extension of tumor. Bone marrow signal intensity otherwise within normal limits. No findings of osteomyelitis discitis or septic arthritis. Cord: Normal signal and morphology. No epidural tumor or abnormal enhancement. Paraspinal and other soft tissues: Unremarkable. Disc levels: T1-2: Disc desiccation with small central disc protrusion. No canal or foraminal stenosis. T2-3: Right paracentral disc protrusion indents the right ventral thecal sac (series 20, image 8). Minimal cord flattening without course of changes. Mild right-sided facet hypertrophy. No significant stenosis. T3-4: Disc desiccation with mild disc bulge and endplate spurring. No stenosis. T4-5: Tiny central disc protrusion minimally indents the ventral thecal sac. No stenosis. T5-6: Mild disc bulging with flattening of the ventral thecal sac. No stenosis. T6-7: Unremarkable. T7-8: Unremarkable. T8-9: Unremarkable. T9-10: Negative interspace.  Mild facet hypertrophy.  No stenosis. T10-11: Negative interspace.  Mild facet hypertrophy.  No stenosis. T11-12: Negative interspace.  Mild facet hypertrophy.  No  stenosis. T12-L1: Unremarkable. IMPRESSION: 1. Osseous metastases T3 through T7 levels as above. Mild chronic compression deformity of T3, possibly pathologic. No extra osseous or epidural extension of tumor. 2. Mild multilevel degenerative spondylosis as above. No significant stenosis or overt neural impingement. Electronically Signed   By: Rise Mu M.D.   On: 12/16/2022 23:38    EKG EKG Interpretation Date/Time:  Thursday January 08 2023 23:39:41 EDT Ventricular Rate:  99 PR Interval:  101 QRS Duration:  79 QT Interval:  378 QTC Calculation: 486 R Axis:   51  Text Interpretation: Sinus rhythm Confirmed by Nicanor Alcon, Silvina Hackleman ([id number]) on 01/08/2023 11:41:29 PM  Radiology No results found.  Procedures Procedures    Medications Ordered in ED Medications  magnesium sulfate IVPB 2 g 50 mL (0 g Intravenous Stopped 01/09/23 0219)  potassium chloride SA (KLOR-CON M) CR tablet 80 mEq (80 mEq Oral Given 01/09/23 0242)    ED Course/ Medical Decision Making/ A&P                             Medical Decision Making Patient with intentional overdose and alcohol intoxication   Problems Addressed: Hypokalemia:    Details: Treated in the ED with potassium and magnesium  Intentional overdose, initial encounter [facility]):    Details: Contiunuing to monitor per poison control  Suicide attempt [facility]):    Details: IVC will monitor until clear   Amount and/or Complexity of Data Reviewed Independent Historian: EMS    Details: See above  External Data Reviewed: notes.    Details: Previous notes reviewed  Labs: ordered.    Details: Normal sodium 143, low potassium repeated 2.7 continued low.  Normal creatinine .56, normal LFts,  negative UDS. Alcohol elevated 141, normal white count 5, normal hemoglobin 12.6, normal platelet count.  Negative tylenol and salicylate levels   Risk Prescription drug management. Risk Details: Under IVC by me, waiting on repeat labs for clearance.      Final  Clinical Impression(s) / ED Diagnoses Final diagnoses:  Suicide  attempt [facility])  Intentional overdose, initial encounter [facility])  Hypokalemia   Signed out to am attending pending repeat labs and clearance     Kejuan Bekker, MD 01/09/23 1610

## 2023-01-09 NOTE — ED Notes (Signed)
Pt not dressed out into burgundy scrubs per doctors orders until pt is medically stable per poison control.

## 2023-01-09 NOTE — ED Notes (Signed)
[place] transport contacted to arrange transportation. Voicemail left.

## 2023-01-09 NOTE — ED Provider Notes (Signed)
Repeat blood work shows that hypokalemia resolved.  Patient medically cleared for psychiatric evaluation.   Rozelle Logan, [place] 01/09/23 [phone]

## 2023-01-09 NOTE — Progress Notes (Signed)
LCSW Progress Note  [id number]   Tracie Atkins  01/09/2023  2:18 PM  Description:   Inpatient Psychiatric Referral  Patient was recommended inpatient per [facility], PMHNP. There are no available beds at [facility] or [facility], per [facility], Charity fundraiser. Patient was referred to the following out of network facilities:   [facility] Provider Address Phone Fax  [facility]  [address], [place] [address] [phone]  [facility]  [phone] [place] [address] [phone]  [facility]  [address] [place], [place] [address] [phone]  [facility]  [address] [place] [address] [phone]  [facility]  [address] [place] [address] [phone]  [facility]  [address] [place] [address] [phone]  [facility]  [address] [place] [address] [phone]  [facility]  [address] [place] [address] [phone]  [facility]  [address] [place] [address] [phone]  [facility]  [address] [place] [address] [phone]  [facility]  [address] [place] [address] [phone]  [facility]  [address], [place] [address] [phone]  [facility]  [address] [place] [address] [phone]  [facility]  [address] [place] [address] [phone]  [facility]  [address] [place] [address] [phone]  [facility]  [address], [place] [address] [phone]  [facility]  [address], [place] [address] [phone]  [facility]  [address] [place] [address] [phone]  [facility]  [address] [place] [address] [phone]    Situation ongoing, CSW to continue following and update chart as more information becomes available.      Cathie Beams, LCSW  01/09/2023 2:18 PM

## 2023-01-09 NOTE — ED Notes (Signed)
Meal given

## 2023-01-09 NOTE — ED Notes (Signed)
Pt shoes and bra placed in pt belongings bag and in cabinet for room 18

## 2023-01-09 NOTE — ED Notes (Signed)
Pt received lunch tray 

## 2023-01-09 NOTE — Consult Note (Signed)
BH ED ASSESSMENT   Reason for Consult: Psych Consult Referring Physician:  Dr. Wilkie Aye Patient Identification: Tracie Atkins MRN:  [id number] ED Chief Complaint: Severe episode of recurrent major depressive disorder, without psychotic features (HCC)  Diagnosis:  Principal Problem:   Severe episode of recurrent major depressive disorder, without psychotic features (HCC) Active Problems:   Suicidal ideations   ED Assessment Time Calculation: Start Time: 1000 Stop Time: 1040 Total Time in Minutes (Assessment Completion): 40   Subjective: Tracie Atkins is a 67 y.o. female patient admitted with a history of depression and alcohol use , breast cancer who presents to the ED for evaluation of suicidal ideation and depression. Patient states that she is depressed about her cancer.    HPI:  Tracie Atkins, 67 y.o., female patient seen face to face by this provider, consulted with Dr. Lucianne Muss; and chart reviewed on 01/09/23.  On evaluation Tracie Atkins ask if she can be discharged.  Patient currently denies ingesting any pills, she states "all I did was drink, I do not know how much."Patient states she did not take her medications to hurt herself, she denies taking any medications, states the only reason she drank the beers is less all she has to do when she stays in her room all day.  She states that she lives with her son, and his son, and they do not take her out anywhere she says the only time she gets out of her room is to go to doctor's appointments.  She says so she stays in her room and drinks.  She currently denies SI/HI/AVH, and states her appetite is fair and her sleep is good says that she sleeps from 9 PM to 5 AM about 8 hours per night.  Patient's mood is labile, she is becoming aggravated with answering questions, gives a brief answers to all questions and appears uninterested in participating in evaluation.  Patient gives permission to speak with the son that she lives with Tracie Atkins.   Patient repeatedly asked "when can I go home".  During evaluation Tracie Atkins is laying in bed in no acute distress. She is alert, oriented x 3, calm, and cooperative.  Her mood is depressed at times becomes irritable with congruent affect.  She has normal speech, and behavior.  Objectively there is no evidence of psychosis/mania or delusional thinking.  Patient is able to converse coherently, goal directed thoughts, no distractibility, or pre-occupation. She denies suicidal/self-harm/homicidal ideation, psychosis, and paranoia.  Patient UDS negative for illicit substances, BAL is 141.  Attempted to call patient's son Tracie Atkins at [phone], no answer unable to leave a voicemail.    Past Psychiatric History: Anxiety and depression   Risk to Self or Others: Risk to Self:  Yes, to hang self Risk to Others:  No  Prior Inpatient Therapy:  Yes  Prior Outpatient Therapy: No   [place] Scale:  Flowsheet Row ED from 01/08/2023 in [facility] Emergency Department at [facility] ED from 12/19/2022 in [facility] Emergency Department at [facility] ED from 12/16/2022 in [facility] Emergency Department at [facility]  C-SSRS RISK CATEGORY High Risk High Risk Low Risk       AIMS:  , , ,  ,   ASAM:    Substance Abuse:     Past Medical History:  Past Medical History:  Diagnosis Date   Breast cancer (HCC) 2023   left   Chronic dental infection 8/189   multiple  teeth removed and Post -op infection.   Depression    ETOH abuse    Fatty liver    Headache     Past Surgical History:  Procedure Laterality Date   BREAST LUMPECTOMY WITH RADIOACTIVE SEED AND SENTINEL LYMPH NODE BIOPSY Left 01/15/2022   Procedure: LEFT BREAST SEED LOCALIZED LUMPECTOMY, LEFT SEED TARGETED AXILLARY LYMPH NODE BIOPSY, LEFT SENTINEL LYMPH NODE BIOPSY;  Surgeon: [name], MD;  Location: [facility] OR;  Service: General;  Laterality: Left;   DENTAL SURGERY     RADIOACTIVE SEED GUIDED  EXCISIONAL BREAST BIOPSY Right 01/15/2022   Procedure: RIGHT BREAST SEED LOCALIZED EXCISIONAL BIOPSY;  Surgeon: [name], MD;  Location: [facility] OR;  Service: General;  Laterality: Right;   TUBAL LIGATION     Family History: History reviewed. No pertinent family history.  Social History:  Social History   Substance and Sexual Activity  Alcohol Use Yes   Alcohol/week: 1.0 standard drink of alcohol   Types: 1 Standard drinks or equivalent per week   Comment: 1 mixed (liquor) drink per week     Social History   Substance and Sexual Activity  Drug Use No    Social History   Socioeconomic History   Marital status: Single    Spouse name: Not on file   Number of children: 3   Years of education: Not on file   Highest education level: Not on file  Occupational History   Not on file  Tobacco Use   Smoking status: Never    Passive exposure: Never   Smokeless tobacco: Never  Vaping Use   Vaping Use: Never used  Substance and Sexual Activity   Alcohol use: Yes    Alcohol/week: 1.0 standard drink of alcohol    Types: 1 Standard drinks or equivalent per week    Comment: 1 mixed (liquor) drink per week   Drug use: No   Sexual activity: Never  Other Topics Concern   Not on file  Social History Narrative   Not on file   Social Determinants of Health   Financial Resource Strain: Not on file  Food Insecurity: No Food Insecurity (10/16/2022)   Hunger Vital Sign    Worried About Running Out of Food in the Last Year: Never true    Ran Out of Food in the Last Year: Never true  Transportation Needs: Unmet Transportation Needs (07/30/2022)   PRAPARE - Administrator, Civil Service (Medical): Yes    Lack of Transportation (Non-Medical): No  Physical Activity: Not on file  Stress: Not on file  Social Connections: Not on file      Allergies:  No Known Allergies  Labs:  Results for orders placed or performed during the hospital encounter of 01/08/23 (from the past 48  hour(s))  CBG monitoring, ED     Status: Abnormal   Collection Time: 01/08/23 11:38 PM  Result Value Ref Range   Glucose-Capillary 140 (H) 70 - 99 mg/dL    Comment: Glucose reference range applies only to samples taken after fasting for at least 8 hours.  Comprehensive metabolic panel     Status: Abnormal   Collection Time: 01/08/23 11:40 PM  Result Value Ref Range   Sodium 143 135 - 145 mmol/L   Potassium 2.6 (LL) 3.5 - 5.1 mmol/L    Comment: CRITICAL RESULT CALLED TO, READ BACK BY AND VERIFIED WITH H. HUGGHES, RN 01/09/23 0029 BY K. DAVIS   Chloride 112 (H) 98 - 111 mmol/L   CO2  20 (L) 22 - 32 mmol/L   Glucose, Bld 130 (H) 70 - 99 mg/dL    Comment: Glucose reference range applies only to samples taken after fasting for at least 8 hours.   BUN 9 8 - 23 mg/dL   Creatinine, Ser 1.61 0.44 - 1.00 mg/dL   Calcium 8.6 (L) 8.9 - 10.3 mg/dL   Total Protein 6.2 (L) 6.5 - 8.1 g/dL   Albumin 3.4 (L) 3.5 - 5.0 g/dL   AST 17 15 - 41 U/L   ALT 17 0 - 44 U/L   Alkaline Phosphatase 127 (H) 38 - 126 U/L   Total Bilirubin <0.1 (L) 0.3 - 1.2 mg/dL   GFR, Estimated >09 >60 mL/min    Comment: (NOTE) Calculated using the CKD-EPI Creatinine Equation (2021)    Anion gap 11 5 - 15    Comment: Performed at [facility], [address], [place], [place] [address]  Salicylate level     Status: Abnormal   Collection Time: 01/08/23 11:40 PM  Result Value Ref Range   Salicylate Lvl <7.0 (L) 7.0 - 30.0 mg/dL    Comment: Performed at [facility], [address], [place], [place] [address]  Acetaminophen level     Status: Abnormal   Collection Time: 01/08/23 11:40 PM  Result Value Ref Range   Acetaminophen (Tylenol), Serum <10 (L) 10 - 30 ug/mL    Comment: (NOTE) Therapeutic concentrations vary significantly. A range of 10-30 ug/mL  may be an effective concentration for many patients. However, some  are best treated at concentrations outside of this  range. Acetaminophen concentrations >150 ug/mL at 4 hours after ingestion  and >50 ug/mL at 12 hours after ingestion are often associated with  toxic reactions.  Performed at [facility], [address] [place], [place] [address]   Ethanol     Status: Abnormal   Collection Time: 01/08/23 11:40 PM  Result Value Ref Range   Alcohol, Ethyl (B) 141 (H) <10 mg/dL    Comment: (NOTE) Lowest detectable limit for serum alcohol is 10 mg/dL.  For medical purposes only. Performed at [facility], [address] [place], [place] [address]   CBC WITH DIFFERENTIAL     Status: None   Collection Time: 01/08/23 11:40 PM  Result Value Ref Range   WBC 5.0 4.0 - 10.5 K/uL   RBC 4.61 3.87 - 5.11 MIL/uL   Hemoglobin 12.6 12.0 - 15.0 g/dL   HCT 08.6 57.8 - 46.9 %   MCV 86.1 80.0 - 100.0 fL   MCH 27.3 26.0 - 34.0 pg   MCHC 31.7 30.0 - 36.0 g/dL   RDW 62.9 52.8 - 41.3 %   Platelets 207 150 - 400 K/uL   nRBC 0.0 0.0 - 0.2 %   Neutrophils Relative % 73 %   Neutro Abs 3.6 1.7 - 7.7 K/uL   Lymphocytes Relative 16 %   Lymphs Abs 0.8 0.7 - 4.0 K/uL   Monocytes Relative 10 %   Monocytes Absolute 0.5 0.1 - 1.0 K/uL   Eosinophils Relative 1 %   Eosinophils Absolute 0.1 0.0 - 0.5 K/uL   Basophils Relative 0 %   Basophils Absolute 0.0 0.0 - 0.1 K/uL   Immature Granulocytes 0 %   Abs Immature Granulocytes 0.01 0.00 - 0.07 K/uL    Comment: Performed at [facility], [address] [place], [place] [address]  Urine rapid drug screen (hosp performed)  Status: None   Collection Time: 01/08/23 11:45 PM  Result Value Ref Range   Opiates NONE DETECTED NONE DETECTED   Cocaine NONE DETECTED NONE DETECTED   Benzodiazepines NONE DETECTED NONE DETECTED   Amphetamines NONE DETECTED NONE DETECTED   Tetrahydrocannabinol NONE DETECTED NONE DETECTED   Barbiturates NONE DETECTED NONE DETECTED    Comment: (NOTE) DRUG SCREEN FOR MEDICAL PURPOSES ONLY.  IF  CONFIRMATION IS NEEDED FOR ANY PURPOSE, NOTIFY LAB WITHIN 5 DAYS.  LOWEST DETECTABLE LIMITS FOR URINE DRUG SCREEN Drug Class                     Cutoff (ng/mL) Amphetamine and metabolites    1000 Barbiturate and metabolites    200 Benzodiazepine                 200 Opiates and metabolites        300 Cocaine and metabolites        300 THC                            50 Performed at [facility], [address] [place], [place] [address]   Potassium     Status: Abnormal   Collection Time: 01/09/23  2:09 AM  Result Value Ref Range   Potassium 2.7 (LL) 3.5 - 5.1 mmol/L    Comment: CRITICAL RESULT CALLED TO, READ BACK BY AND VERIFIED WITH Haywood Lasso, RN 01/09/23 0238 BY K. DAVIS Performed at [facility], [address] [place], [place] [address]   Comprehensive metabolic panel     Status: Abnormal   Collection Time: 01/09/23  7:09 AM  Result Value Ref Range   Sodium 142 135 - 145 mmol/L   Potassium 3.6 3.5 - 5.1 mmol/L   Chloride 112 (H) 98 - 111 mmol/L   CO2 21 (L) 22 - 32 mmol/L   Glucose, Bld 102 (H) 70 - 99 mg/dL    Comment: Glucose reference range applies only to samples taken after fasting for at least 8 hours.   BUN 9 8 - 23 mg/dL   Creatinine, Ser 8.29 0.44 - 1.00 mg/dL   Calcium 8.0 (L) 8.9 - 10.3 mg/dL   Total Protein 6.2 (L) 6.5 - 8.1 g/dL   Albumin 3.4 (L) 3.5 - 5.0 g/dL   AST 19 15 - 41 U/L   ALT 18 0 - 44 U/L   Alkaline Phosphatase 130 (H) 38 - 126 U/L   Total Bilirubin 0.4 0.3 - 1.2 mg/dL   GFR, Estimated >56 >21 mL/min    Comment: (NOTE) Calculated using the CKD-EPI Creatinine Equation (2021)    Anion gap 9 5 - 15    Comment: Performed at [facility], [address] [place], [place] [address]    Current Facility-Administered Medications  Medication Dose Route Frequency Provider Last Rate Last Admin   escitalopram (LEXAPRO) tablet 20 mg  20 mg Oral Daily Horton, Kristie M, DO   20 mg at 01/09/23  1018   topiramate (TOPAMAX) tablet 50 mg  50 mg Oral BID Horton, Kristie M, DO   50 mg at 01/09/23 1018   traZODone (DESYREL) tablet 50 mg  50 mg Oral QHS PRN Horton, Clabe Seal, DO       Current Outpatient Medications  Medication Sig Dispense Refill   anastrozole (ARIMIDEX) 1 MG tablet Take 1 tablet (1 mg total) by mouth daily. 90 tablet 3  diphenhydrAMINE (BENADRYL) 25 mg capsule Take 25 mg by mouth in the morning.     oxyCODONE (ROXICODONE) 5 MG immediate release tablet Take 1 tablet (5 mg total) by mouth every 4 (four) hours as needed for severe pain or breakthrough pain. 60 tablet 0   escitalopram (LEXAPRO) 10 MG tablet Take 10 mg by mouth daily. (Patient not taking: Reported on 01/09/2023)     escitalopram (LEXAPRO) 20 MG tablet Take 20 mg by mouth daily. (Patient not taking: Reported on 01/09/2023)     montelukast (SINGULAIR) 10 MG tablet Take 10 mg by mouth at bedtime. (Patient not taking: Reported on 01/09/2023)     pantoprazole (PROTONIX) 40 MG tablet Take 40 mg by mouth at bedtime. (Patient not taking: Reported on 01/09/2023)     topiramate (TOPAMAX) 50 MG tablet Take 50 mg by mouth 2 (two) times daily. (Patient not taking: Reported on 01/09/2023)     traZODone (DESYREL) 50 MG tablet Take 50 mg by mouth at bedtime as needed for sleep. (Patient not taking: Reported on 01/09/2023)      Musculoskeletal: Strength & Muscle Tone: within normal limits Gait & Station: normal Patient leans: N/A   Psychiatric Specialty Exam: Presentation  General Appearance:  Disheveled  Eye Contact: Fleeting  Speech: Clear and Coherent  Speech Volume: Decreased  Handedness: Right   Mood and Affect  Mood: Depressed  Affect: Depressed; Flat   Thought Process  Thought Processes: Coherent  Descriptions of Associations:Intact  Orientation:Full (Time, Place and Person)  Thought Content:WDL  History of Schizophrenia/Schizoaffective disorder:No data recorded Duration of Psychotic Symptoms:No  data recorded Hallucinations:Hallucinations: None  Ideas of Reference:None  Suicidal Thoughts:Suicidal Thoughts: Yes, Passive  Homicidal Thoughts:Homicidal Thoughts: No   Sensorium  Memory: Immediate Good; Recent Good  Judgment: Poor  Insight: Fair   Chartered certified accountant: Fair  Attention Span: Fair  Recall: Fair  Fund of Knowledge: Fair  Language: Good   Psychomotor Activity  Psychomotor Activity: Psychomotor Activity: Normal   Assets  Assets: Communication Skills; Social Support; Desire for Improvement; Housing    Sleep  Sleep: Sleep: Good Number of Hours of Sleep: 8   Physical Exam: Physical Exam Vitals and nursing note reviewed. Exam conducted with a chaperone present.  Neurological:     Mental Status: She is alert.  Psychiatric:        Attention and Perception: Attention normal.        Mood and Affect: Mood is anxious and depressed. Affect is labile and flat.        Speech: Speech normal.        Behavior: Behavior is agitated. Behavior is cooperative.        Thought Content: Thought content includes suicidal ideation.        Cognition and Memory: Memory normal.        Judgment: Judgment is impulsive and inappropriate.    Review of Systems  Constitutional: Negative.   Psychiatric/Behavioral:  Positive for depression, substance abuse and suicidal ideas.    Blood pressure (!) 142/94, pulse 98, temperature 98.3 F (36.8 C), temperature source Oral, resp. rate (!) 23, SpO2 95 %. There is no height or weight on file to calculate BMI.   Medical Decision Making: Pt case reviewed and discussed with Dr. Lucianne Muss. Pt does meet criteria for IVC and inpatient psychiatric treatment. Patient needs inpatient psychiatric admission for stabilization and treatment. Unable to obtain additional collateral at this time. [facility] and CSW notified of disposition. Will restart home medications. EDP, RN, and  LCSW notified of disposition.      Disposition: Recommend psychiatric Inpatient admission.    Gus Littler MOTLEY-MANGRUM, PMHNP 01/09/2023 1:27 PM

## 2023-01-09 NOTE — ED Notes (Addendum)
Spoke with Danelle Earthly from poison control. Recommendations for cardiac monitor, tylenol, salicylate, cmp. Monitor for acidosis,  QT prolongation, CNS depression, renal function, seizure precautions - benzos for seizures and agitation. Repeat cmp in 6 hrs and supportive care as needed. Dr Nicanor Alcon to be made aware. Recommended 12 hrs obs time

## 2023-01-09 NOTE — ED Notes (Signed)
Sitter at bedside.

## 2023-01-10 DIAGNOSIS — T402X2A Poisoning by other opioids, intentional self-harm, initial encounter: Secondary | ICD-10-CM | POA: Diagnosis not present

## 2023-01-10 MED ORDER — IBUPROFEN 200 MG PO TABS
600.0000 mg | ORAL_TABLET | Freq: Four times a day (QID) | ORAL | Status: DC | PRN
  Administered 2023-01-10: 600 mg via ORAL
  Filled 2023-01-10: qty 3

## 2023-01-10 MED ORDER — ACETAMINOPHEN 325 MG PO TABS
650.0000 mg | ORAL_TABLET | ORAL | Status: DC | PRN
  Administered 2023-01-10: 650 mg via ORAL
  Filled 2023-01-10: qty 2

## 2023-01-10 NOTE — ED Notes (Signed)
Sheriff office called again for transport, left voicemail.

## 2023-01-10 NOTE — ED Provider Notes (Signed)
Emergency Medicine Observation Re-evaluation Note  Tracie Atkins is a 67 y.o. female, seen on rounds today.  Pt initially presented to the ED for complaints of Ingestion and Alcohol Intoxication Currently, the patient is asleep. Pt presented to the ED yesterday with si and an intentional od.  Pt she has been medically cleared and is awaiting inpatient placement.  No problems reported overnight.  Physical Exam  BP 122/71 (BP Location: Right Arm)   Pulse 86   Temp 98.4 F (36.9 C) (Oral)   Resp 18   SpO2 96%  Physical Exam General: asleep Cardiac: rr Lungs: clear Psych: asleep  ED Course / MDM  EKG:EKG Interpretation Date/Time:  Thursday January 08 2023 23:39:41 EDT Ventricular Rate:  99 PR Interval:  101 QRS Duration:  79 QT Interval:  378 QTC Calculation: 486 R Axis:   51  Text Interpretation: Sinus rhythm Confirmed by Nicanor Alcon, April ([id number]) on 01/08/2023 11:41:29 PM  I have reviewed the labs performed to date as well as medications administered while in observation.  Recent changes in the last 24 hours include none.  Plan  Current plan is for inpatient placement.    Jacalyn Lefevre, MD 01/10/23 [phone]

## 2023-01-12 ENCOUNTER — Ambulatory Visit
Admission: RE | Admit: 2023-01-12 | Discharge: 2023-01-12 | Disposition: A | Discharge status: Home / Self Care | Payer: 59 | Source: Ambulatory Visit | Attending: Radiation Oncology | Admitting: Radiation Oncology

## 2023-01-16 ENCOUNTER — Inpatient Hospital Stay: Payer: 59

## 2023-01-16 ENCOUNTER — Encounter: Payer: Self-pay | Admitting: Adult Health

## 2023-01-16 ENCOUNTER — Inpatient Hospital Stay: Payer: 59 | Attending: Hematology and Oncology | Admitting: Adult Health

## 2023-01-16 ENCOUNTER — Inpatient Hospital Stay: Payer: 59 | Admitting: Licensed Clinical Social Worker

## 2023-01-16 ENCOUNTER — Other Ambulatory Visit: Payer: Self-pay | Admitting: *Deleted

## 2023-01-16 ENCOUNTER — Other Ambulatory Visit: Payer: Self-pay | Admitting: Adult Health

## 2023-01-16 ENCOUNTER — Other Ambulatory Visit: Payer: Self-pay

## 2023-01-16 VITALS — BP 130/82 | HR 98 | Temp 97.9°F | Resp 17 | Wt 183.5 lb

## 2023-01-16 DIAGNOSIS — Z170 Estrogen receptor positive status [ER+]: Secondary | ICD-10-CM | POA: Diagnosis not present

## 2023-01-16 DIAGNOSIS — C50412 Malignant neoplasm of upper-outer quadrant of left female breast: Secondary | ICD-10-CM | POA: Diagnosis not present

## 2023-01-16 DIAGNOSIS — Z79811 Long term (current) use of aromatase inhibitors: Secondary | ICD-10-CM | POA: Diagnosis not present

## 2023-01-16 DIAGNOSIS — F32A Depression, unspecified: Secondary | ICD-10-CM | POA: Insufficient documentation

## 2023-01-16 DIAGNOSIS — F332 Major depressive disorder, recurrent severe without psychotic features: Secondary | ICD-10-CM

## 2023-01-16 DIAGNOSIS — C7951 Secondary malignant neoplasm of bone: Secondary | ICD-10-CM | POA: Diagnosis not present

## 2023-01-16 DIAGNOSIS — E785 Hyperlipidemia, unspecified: Secondary | ICD-10-CM | POA: Insufficient documentation

## 2023-01-16 DIAGNOSIS — C773 Secondary and unspecified malignant neoplasm of axilla and upper limb lymph nodes: Secondary | ICD-10-CM | POA: Diagnosis not present

## 2023-01-16 DIAGNOSIS — I10 Essential (primary) hypertension: Secondary | ICD-10-CM | POA: Insufficient documentation

## 2023-01-16 DIAGNOSIS — E669 Obesity, unspecified: Secondary | ICD-10-CM | POA: Insufficient documentation

## 2023-01-16 DIAGNOSIS — E559 Vitamin D deficiency, unspecified: Secondary | ICD-10-CM | POA: Insufficient documentation

## 2023-01-16 DIAGNOSIS — E66811 Obesity, class 1: Secondary | ICD-10-CM | POA: Insufficient documentation

## 2023-01-16 DIAGNOSIS — J302 Other seasonal allergic rhinitis: Secondary | ICD-10-CM | POA: Insufficient documentation

## 2023-01-16 LAB — CBC WITH DIFFERENTIAL/PLATELET
Abs Immature Granulocytes: 0.01 10*3/uL (ref 0.00–0.07)
Basophils Absolute: 0 10*3/uL (ref 0.0–0.1)
Basophils Relative: 1 %
Eosinophils Absolute: 0.1 10*3/uL (ref 0.0–0.5)
Eosinophils Relative: 1 %
HCT: 42.8 % (ref 36.0–46.0)
Hemoglobin: 13.9 g/dL (ref 12.0–15.0)
Immature Granulocytes: 0 %
Lymphocytes Relative: 14 %
Lymphs Abs: 0.8 10*3/uL (ref 0.7–4.0)
MCH: 28 pg (ref 26.0–34.0)
MCHC: 32.5 g/dL (ref 30.0–36.0)
MCV: 86.3 fL (ref 80.0–100.0)
Monocytes Absolute: 0.8 10*3/uL (ref 0.1–1.0)
Monocytes Relative: 13 %
Neutro Abs: 4.2 10*3/uL (ref 1.7–7.7)
Neutrophils Relative %: 71 %
Platelets: 224 10*3/uL (ref 150–400)
RBC: 4.96 MIL/uL (ref 3.87–5.11)
RDW: 15 % (ref 11.5–15.5)
WBC: 5.8 10*3/uL (ref 4.0–10.5)
nRBC: 0 % (ref 0.0–0.2)

## 2023-01-16 LAB — COMPREHENSIVE METABOLIC PANEL
ALT: 14 U/L (ref 0–44)
AST: 14 U/L — ABNORMAL LOW (ref 15–41)
Albumin: 4 g/dL (ref 3.5–5.0)
Alkaline Phosphatase: 156 U/L — ABNORMAL HIGH (ref 38–126)
Anion gap: 5 (ref 5–15)
BUN: 12 mg/dL (ref 8–23)
CO2: 26 mmol/L (ref 22–32)
Calcium: 9.1 mg/dL (ref 8.9–10.3)
Chloride: 109 mmol/L (ref 98–111)
Creatinine, Ser: 0.69 mg/dL (ref 0.44–1.00)
GFR, Estimated: >60 mL/min (ref >60)
Glucose, Bld: 70 mg/dL (ref 70–99)
Potassium: 3.9 mmol/L (ref 3.5–5.1)
Sodium: 140 mmol/L (ref 135–145)
Total Bilirubin: 0.3 mg/dL (ref 0.3–1.2)
Total Protein: 6.9 g/dL (ref 6.5–8.1)

## 2023-01-16 MED ORDER — MELOXICAM 7.5 MG PO TABS: 7.5000 mg | ORAL_TABLET | Freq: Every day | ORAL | 1 refills | Status: DC

## 2023-01-16 NOTE — Assessment & Plan Note (Signed)
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  She continues on anastrozole.  Stage IV breast cancer: She continues on anastrozole daily with good tolerance.  I recommended that she continue this.  I am unsure of where she is at with her Guardant360 testing my nurse is following up on this and we will obtain it today if we need to.  She will undergo repeat CT chest abdomen pelvis the week of August 12 and will have follow-up with Dr. Al Pimple the week of August 19 to discuss her scans and whether she needs to change her therapy.  We also discussed the need for her mental health issues to become stabilized so that we are able to add treatments if we need to.   Depression: I placed a referral to our social worker Tempie Donning to help arrange counseling and psychiatry follow-up.  [name]'s mental health really needs to stabilize for her to be able to maintain treatment and add treatments that may be more physically demanding if needed.  She and her son verbalize understanding about this.  T3 metastases: There is a compression fracture in this area on MRI.  I placed an order for IR to evaluate the area to see if she is a candidate for kyphoplasty in osteo cool. Pinched nerve in the lumbar spine: This is not related to her cancer.  I did put her on low-dose meloxicam for her to take with food in the morning.  Should she develop any blood in her stool or black tarry stools she knows to let us know immediately.  She is also on a PPI daily which she verbalized she is taking and knows to continue this.  We will see [name] back in 4 weeks for repeat scans in 5 weeks for follow-up with Dr. Al Pimple.  She knows to call for any questions or concerns that may arise between now and then.

## 2023-01-16 NOTE — Progress Notes (Signed)
[facility] Clinical Social Work  Clinical Social Work was referred by medical provider for connection with Systems analyst.  Clinical Social Worker contacted patient by phone to offer support and assess for needs.  Pt stated that she needed assistance with finding medication management providers. CSW contacted agencies, pt selected [facility]. CSW will request that medical staff put in a referral to selected agency.      Marguerita Merles, LCSW  Clinical Social Worker [facility]

## 2023-01-16 NOTE — Progress Notes (Signed)
[facility] Cancer Follow up:    Patient, No Pcp Per No address on file   DIAGNOSIS:  Cancer Staging  Malignant neoplasm of upper-outer quadrant of left breast in female, estrogen receptor positive (HCC) Staging form: Breast, AJCC 8th Edition - Clinical: Stage IIA (cT2, cN1, cM0, G2, ER+, PR+, HER2-) - Signed by Serena Croissant, MD on 12/05/2021 Stage prefix: Initial diagnosis Histologic grading system: 3 grade system - Clinical: Stage IV (cM1) - Signed by Loa Socks, NP on 01/16/2023   SUMMARY OF ONCOLOGIC HISTORY: Oncology History  Malignant neoplasm of upper-outer quadrant of left breast in female, estrogen receptor positive (HCC)  11/05/2021 Initial Diagnosis   Palpable lump in the left breast with the pain along with right-sided nipple discharge.  Mammogram reveals highly suspicious mass left breast 3 o'clock position 2.2 cm, 2 abnormal left axillary lymph nodes: Biopsy: Grade 2 IDC ER 100%, PR 0%, Ki-67 10%, HER2 2+ by IHC, FISH negative ratio 1.4, axillary lymph node biopsy: IDC, ER 100%, PR 20%, Ki-67 10%, HER2 negative ratio 1.31   11/28/2021 Breast MRI   Left breast cancer 2.7 cm and 0.7 cm linear enhancement UOQ left breast needs biopsy, 0.5 cm mass posterior to the right nipple needs biopsy, 2-lymph nodes left axilla, 1.5 cm sternal lesion   12/05/2021 Cancer Staging   Staging form: Breast, AJCC 8th Edition - Clinical: Stage IIA (cT2, cN1, cM0, G2, ER+, PR+, HER2-) - Signed by Serena Croissant, MD on 12/05/2021 Stage prefix: Initial diagnosis Histologic grading system: 3 grade system   01/15/2022 Pathology Results   SURGICAL PATHOLOGY  CASE: [id number]  PATIENT: Tracie Atkins  Surgical Pathology Report      Clinical History: left breast cancer (cm)   FINAL MICROSCOPIC DIAGNOSIS:   A. BREAST, LEFT, LUMPECTOMY:  Invasive ductal carcinoma with clear margins of resection.  Please see the synoptic report after specimen D.   B. BREAST, RIGHT,  LUMPECTOMY:  Fibrous scar with hemosiderin deposits.  Carcinoma is not identified in the resected specimen.   C. LYMPH NODE, LEFT AXILLARY #2, SENTINEL, EXCISION:  A lymph node negative for metastatic carcinoma.   D. 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   pTNM classification ( AJCC 8th Edition): pT2, pN1a  Results of prognostic markers performed on prior biopsy from 11/05/2021  ([id number])           ER: Positive in 100% of tumor cells.           PR: Negative.           Ki-67: Positive in 10% of tumor cells.           HER-2/neu: 2+ by IHC/ Negative by FISH.    02/17/2022 Oncotype testing   Oncotype of 11, no benefit from chemotherapy.   03/26/2022 - 04/11/2022 Radiation Therapy   9 fractions totaling 16.2 Gy prior to relocation to [place]   10/20/2022 PET scan   1. Multifocal hypermetabolic skeletal metastasis involving the sternum, T6 vertebral body, T5 and T7 posterior elements, and inferior RIGHT scapula. 2. Hypermetabolic LEFT internal mammary lymph node most consistent with metastatic adenopathy. 3. Postsurgical change in the RIGHT and LEFT breast. No residual carcinoma identified in the breasts or axilla by FDG PET imaging.     10/23/2022 - 11/05/2022 Radiation Therapy   Plan Name: Chest Site: Thorax including T5-7 and sternum Technique: 3D Mode: Photon Dose Per Fraction: 3 Gy Prescribed Dose (Delivered /  Prescribed): 30 Gy / 30 Gy Prescribed Fxs (Delivered / Prescribed): 10 / 10   11/05/2022 -  Anti-estrogen oral therapy   Anastrozole daily   12/16/2022 Imaging   IMPRESSION: 1. Osseous metastases T3 through T7 levels as above. Mild chronic compression deformity of T3, possibly pathologic. No extra osseous or epidural extension of tumor. 2. Mild multilevel degenerative spondylosis as above. No significant stenosis or overt neural impingement.     01/16/2023 Cancer Staging   Staging form:  Breast, AJCC 8th Edition - Clinical: Stage IV (cM1) - Signed by Loa Socks, NP on 01/16/2023     CURRENT THERAPY: Anastrozole  INTERVAL HISTORY: Tracie Atkins 67 y.o. female returns for f/u of her metastatic breast cancer.  She is accompanied by her son Ignacia Bayley  She was diagnosed via pet scan in April and underwent palliative radiation from T5-T7 of her spine.  She underwent MRI of the spine that noted additional osseous metastasis in T3 and T4 with a compression deformity of T3 possibly pathologic.  It does not appear that she got radiation to this area of concern in T3 and T4.  She is c/o numbness in her right leg that has been examined and determined to be a pinched nerve based on MRI of the lumbar spine completed on December 16, 2022.  He was taking oxycodone however she ran out a few weeks ago.  Tracie Atkins has continued on anastrozole daily with good tolerance.  Unfortunately she has had several recent ER visits most recently July 4 for a suicide attempt she was discharged yesterday and has no follow-up arranged for counseling or psychiatry.   Patient Active Problem List   Diagnosis Date Noted   Metastasis to bone (HCC) 01/16/2023   Vitamin D deficiency 01/16/2023   Seasonal allergies 01/16/2023   Obesity (BMI 30.0-34.9) 01/16/2023   Hyperlipidemia 01/16/2023   Essential hypertension 01/16/2023   Alcohol use disorder, severe, dependence (HCC) 11/07/2022   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   Malignant neoplasm of upper-outer quadrant of left breast in female, estrogen receptor positive (HCC) 12/05/2021   Severe episode of recurrent major depressive disorder, without psychotic features (HCC) 08/19/2021   Suicidal ideations    Alcohol abuse with alcohol-induced mood disorder (HCC) 11/20/2016    has No Known Allergies.  MEDICAL HISTORY: Past Medical History:  Diagnosis Date   Breast cancer (HCC) 2023   left   Chronic dental infection 8/189   multiple teeth removed and Post -op  infection.   Depression    ETOH abuse    Fatty liver    Headache     SURGICAL HISTORY: Past Surgical History:  Procedure Laterality Date   BREAST LUMPECTOMY WITH RADIOACTIVE SEED AND SENTINEL LYMPH NODE BIOPSY Left 01/15/2022   Procedure: LEFT BREAST SEED LOCALIZED LUMPECTOMY, LEFT SEED TARGETED AXILLARY LYMPH NODE BIOPSY, LEFT SENTINEL LYMPH NODE BIOPSY;  Surgeon: [name], MD;  Location: [facility] OR;  Service: General;  Laterality: Left;   DENTAL SURGERY     RADIOACTIVE SEED GUIDED EXCISIONAL BREAST BIOPSY Right 01/15/2022   Procedure: RIGHT BREAST SEED LOCALIZED EXCISIONAL BIOPSY;  Surgeon: [name], MD;  Location: [facility] OR;  Service: General;  Laterality: Right;   TUBAL LIGATION      SOCIAL HISTORY: Social History   Socioeconomic History   Marital status: Single    Spouse name: Not on file   Number of children: 3   Years of education: Not on file   Highest education level: Not on file  Occupational History  Not on file  Tobacco Use   Smoking status: Never    Passive exposure: Never   Smokeless tobacco: Never  Vaping Use   Vaping status: Never Used  Substance and Sexual Activity   Alcohol use: Yes    Alcohol/week: 1.0 standard drink of alcohol    Types: 1 Standard drinks or equivalent per week    Comment: 1 mixed (liquor) drink per week   Drug use: No   Sexual activity: Never  Other Topics Concern   Not on file  Social History Narrative   Not on file   Social Determinants of Health   Financial Resource Strain: Not on file  Food Insecurity: No Food Insecurity (10/16/2022)   Hunger Vital Sign    Worried About Running Out of Food in the Last Year: Never true    Ran Out of Food in the Last Year: Never true  Transportation Needs: Unmet Transportation Needs (07/30/2022)   PRAPARE - Administrator, Civil Service (Medical): Yes    Lack of Transportation (Non-Medical): No  Physical Activity: Not on file  Stress: Not on file  Social Connections: Not on  file  Intimate Partner Violence: Not At Risk (10/16/2022)   Humiliation, Afraid, Rape, and Kick questionnaire    Fear of Current or Ex-Partner: No    Emotionally Abused: No    Physically Abused: No    Sexually Abused: No    FAMILY HISTORY: History reviewed. No pertinent family history.  Review of Systems  Constitutional:  Negative for appetite change, chills, fatigue, fever and unexpected weight change.  HENT:   Negative for hearing loss, lump/mass and trouble swallowing.   Eyes:  Negative for eye problems and icterus.  Respiratory:  Negative for chest tightness, cough and shortness of breath.   Cardiovascular:  Negative for chest pain, leg swelling and palpitations.  Gastrointestinal:  Negative for abdominal distention, abdominal pain, constipation, diarrhea, nausea and vomiting.  Endocrine: Negative for hot flashes.  Genitourinary:  Negative for difficulty urinating.   Musculoskeletal:  Negative for arthralgias.  Skin:  Negative for itching and rash.  Neurological:  Negative for dizziness, extremity weakness, headaches and numbness.  Hematological:  Negative for adenopathy. Does not bruise/bleed easily.  Psychiatric/Behavioral:  Negative for depression and suicidal ideas. The patient is not nervous/anxious.       PHYSICAL EXAMINATION   Onc Performance Status - 01/16/23 0906       ECOG Perf Status   ECOG Perf Status Restricted in physically strenuous activity but ambulatory and able to carry out work of a light or sedentary nature, e.g., light house work, office work      KPS SCALE   KPS % SCORE Normal activity with effort, some s/s of disease             Vitals:   01/16/23 0854  BP: 130/82  Pulse: 98  Resp: 17  Temp: 97.9 F (36.6 C)  SpO2: 95%    Physical Exam Constitutional:      General: She is not in acute distress.    Appearance: Normal appearance. She is not toxic-appearing.  HENT:     Head: Normocephalic and atraumatic.     Mouth/Throat:      Mouth: Mucous membranes are moist.     Pharynx: Oropharynx is clear. No oropharyngeal exudate or posterior oropharyngeal erythema.  Eyes:     General: No scleral icterus. Cardiovascular:     Rate and Rhythm: Normal rate and regular rhythm.     Pulses:  Normal pulses.     Heart sounds: Normal heart sounds.  Pulmonary:     Effort: Pulmonary effort is normal.     Breath sounds: Normal breath sounds.  Abdominal:     General: Abdomen is flat. Bowel sounds are normal. There is no distension.     Palpations: Abdomen is soft.     Tenderness: There is no abdominal tenderness.  Musculoskeletal:        General: No swelling.     Cervical back: Neck supple.  Lymphadenopathy:     Cervical: No cervical adenopathy.  Skin:    General: Skin is warm and dry.     Findings: No rash.  Neurological:     General: No focal deficit present.     Mental Status: She is alert.  Psychiatric:        Mood and Affect: Mood normal.        Behavior: Behavior normal.     Comments: tearful     LABORATORY DATA:  CBC    Component Value Date/Time   WBC 5.0 01/08/2023 2340   RBC 4.61 01/08/2023 2340   HGB 12.6 01/08/2023 2340   HCT 39.7 01/08/2023 2340   PLT 207 01/08/2023 2340   MCV 86.1 01/08/2023 2340   MCH 27.3 01/08/2023 2340   MCHC 31.7 01/08/2023 2340   RDW 15.4 01/08/2023 2340   LYMPHSABS 0.8 01/08/2023 2340   MONOABS 0.5 01/08/2023 2340   EOSABS 0.1 01/08/2023 2340   BASOSABS 0.0 01/08/2023 2340    CMP     Component Value Date/Time   NA 142 01/09/2023 0709   K 3.6 01/09/2023 0709   CL 112 (H) 01/09/2023 0709   CO2 21 (L) 01/09/2023 0709   GLUCOSE 102 (H) 01/09/2023 0709   BUN 9 01/09/2023 0709   CREATININE 0.52 01/09/2023 0709   CALCIUM 8.0 (L) 01/09/2023 0709   PROT 6.2 (L) 01/09/2023 0709   ALBUMIN 3.4 (L) 01/09/2023 0709   AST 19 01/09/2023 0709   ALT 18 01/09/2023 0709   ALKPHOS 130 (H) 01/09/2023 0709   BILITOT 0.4 01/09/2023 0709   GFRNONAA >60 01/09/2023 0709   GFRAA >60  06/12/2018 1323         ASSESSMENT and THERAPY PLAN:   Malignant neoplasm of upper-outer quadrant of left breast in female, estrogen receptor positive (HCC) Tracie Atkins is a 67 year old woman with stage IV breast cancer metastatic to the bone.  She continues on anastrozole.  Stage IV breast cancer: She continues on anastrozole daily with good tolerance.  I recommended that she continue this.  I am unsure of where she is at with her Guardant360 testing my nurse is following up on this and we will obtain it today if we need to.  She will undergo repeat CT chest abdomen pelvis the week of August 12 and will have follow-up with Dr. Al Pimple the week of August 19 to discuss her scans and whether she needs to change her therapy.  We also discussed the need for her mental health issues to become stabilized so that we are able to add treatments if we need to.   Depression: I placed a referral to our social worker Tempie Donning to help arrange counseling and psychiatry follow-up.  Tracie Atkins's mental health really needs to stabilize for her to be able to maintain treatment and add treatments that may be more physically demanding if needed.  She and her son verbalize understanding about this.  T3 metastases: There is a compression fracture in this area  on MRI.  I placed an order for IR to evaluate the area to see if she is a candidate for kyphoplasty in osteo cool. Pinched nerve in the lumbar spine: This is not related to her cancer.  I did put her on low-dose meloxicam for her to take with food in the morning.  Should she develop any blood in her stool or black tarry stools she knows to let us know immediately.  She is also on a PPI daily which she verbalized she is taking and knows to continue this.  We will see Tracie Atkins back in 4 weeks for repeat scans in 5 weeks for follow-up with Dr. Al Pimple.  She knows to call for any questions or concerns that may arise between now and then.   All questions were answered. The  patient knows to call the clinic with any problems, questions or concerns. We can certainly see the patient much sooner if necessary.  Total encounter time:45 minutes*in face-to-face visit time, chart review, lab review, care coordination, order entry, and documentation of the encounter time.    Lillard Anes, NP 01/16/23 10:14 AM Medical Oncology and Hematology [facility] [address] [place], [place] [address] Tel. [phone]    Fax. [phone]  *Total Encounter Time as defined by the Centers for Medicare and Medicaid Services includes, in addition to the face-to-face time of a patient visit (documented in the note above) non-face-to-face time: obtaining and reviewing outside history, ordering and reviewing medications, tests or procedures, care coordination (communications with other health care professionals or caregivers) and documentation in the medical record.

## 2023-01-17 LAB — CANCER ANTIGEN 27.29: CA 27.29: 14.4 U/mL (ref 0.0–38.6)

## 2023-01-18 LAB — CANCER ANTIGEN 15-3: CA 15-3: 17.5 U/mL (ref 0.0–25.0)

## 2023-01-19 ENCOUNTER — Other Ambulatory Visit: Payer: Self-pay | Admitting: *Deleted

## 2023-01-19 ENCOUNTER — Other Ambulatory Visit: Payer: Self-pay | Admitting: Adult Health

## 2023-01-19 DIAGNOSIS — C50412 Malignant neoplasm of upper-outer quadrant of left female breast: Secondary | ICD-10-CM

## 2023-01-19 DIAGNOSIS — C7951 Secondary malignant neoplasm of bone: Secondary | ICD-10-CM

## 2023-01-19 DIAGNOSIS — F332 Major depressive disorder, recurrent severe without psychotic features: Secondary | ICD-10-CM

## 2023-01-20 ENCOUNTER — Telehealth: Payer: Self-pay | Admitting: Adult Health

## 2023-01-20 ENCOUNTER — Telehealth: Payer: Self-pay | Admitting: *Deleted

## 2023-01-20 NOTE — Telephone Encounter (Signed)
Tracie Atkins was referred for KP Osteocool and bone bx T3. Per Dr. Archer Asa anything higher than T5 can't get a KP, so therefore cannot get an osteocool. He recommended a referral to radiation oncology for XRT. I have called Hematology Oncology and made them aware as well as the patient./vm

## 2023-01-20 NOTE — Telephone Encounter (Signed)
 Scheduled appointment per 7/12 los. Left voicemail for patient.

## 2023-01-21 ENCOUNTER — Encounter: Payer: Self-pay | Admitting: Hematology and Oncology

## 2023-01-21 ENCOUNTER — Other Ambulatory Visit: Payer: Self-pay | Admitting: *Deleted

## 2023-01-21 DIAGNOSIS — Z170 Estrogen receptor positive status [ER+]: Secondary | ICD-10-CM

## 2023-01-21 DIAGNOSIS — C7951 Secondary malignant neoplasm of bone: Secondary | ICD-10-CM

## 2023-01-26 LAB — GUARDANT 360

## 2023-01-27 ENCOUNTER — Telehealth: Payer: Self-pay

## 2023-01-27 ENCOUNTER — Inpatient Hospital Stay: Payer: 59 | Admitting: Licensed Clinical Social Worker

## 2023-01-27 NOTE — Telephone Encounter (Signed)
Patient called and asked for return call. CSW returned call and left vm.

## 2023-01-27 NOTE — Progress Notes (Signed)
[facility] Clinical Social Work  Initial Assessment   Tracie Atkins is a 67 y.o. year old female contacted by phone. Clinical Social Work was referred by self for assessment of psychosocial needs.   SDOH (Social Determinants of Health) assessments performed: Yes SDOH Interventions    Flowsheet Row Office Visit from 07/31/2022 in [facility] at [facility] Clinical Support from 03/13/2022 in [facility] at [facility]  SDOH Interventions    Housing Interventions -- Intervention Not Indicated  Transportation Interventions Other (Comment)  [Patient does not have resources to get to and from appt on her own through payor benefits or personal means, thus setup through [place] with [employer]] Payor Benefit, Other (Comment)  [transportation coordinator]       SDOH Screenings   Food Insecurity: No Food Insecurity (10/16/2022)  Housing: Low Risk  (10/16/2022)  Transportation Needs: Unmet Transportation Needs (07/30/2022)  Utilities: Not At Risk (10/16/2022)  Alcohol Screen: Low Risk  (08/19/2021)  Depression (PHQ2-9): High Risk (11/06/2022)  Tobacco Use: Low Risk  (01/16/2023)     Distress Screen completed: No    10/16/2022   11:27 AM  ONCBCN DISTRESS SCREENING  Distress experienced in past week (1-10) 9  Emotional problem type Nervousness/Anxiety;Adjusting to illness      Family/Social Information:  Housing Arrangement: Patient lives alone. Patient initiated referral for assistance with locating housing referrals for lower income.  Family members/support persons in your life? Patient named her oldest son as a part of support system. Patient expressed high levels of social isolation and a distinct need for emotional and social support.  Transportation concerns: Patient stated that she relies on her son to transport her to appointments and errands including grocery shopping. Patient stated that her son is only able to transport her on Monday / Fridays, and this  may become a barrier for appointments in the future.  Employment: Patient is not working at this time.  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 Patient reported that her monthly income is 800 and has to pay 600 in rent. Patient stated that she does not have remaining money to cover necessities.  Type of concern: Rent/ mortgage Food access concerns: no Services Currently in place:  Insurance, Transportation  Coping/ Adjustment to diagnosis: Patient understands treatment plan and what happens next? yes Concerns about diagnosis and/or treatment: Overwhelmed by information and going through cancer treatment without a strong support system.  Patient reported stressors: Housing, Adjusting to my illness, and Isolation/ feeling alone Patient enjoys  Being social and around others. Current coping skills/ strengths: Average or above average intelligence , Capable of independent living , Communication skills , and General fund of knowledge  Client reported ongoing challenges with depression. No current SI/HI.    SUMMARY: Current SDOH Barriers:  Limited social support, Transportation, Mental Health Concerns , and Social Isolation  Clinical Social Work Clinical Goal(s):  Explore community resource options for unmet needs related to:  Housing , Transportation, and Depression    Interventions: Discussed common feeling and emotions when being diagnosed with cancer, and the importance of support during treatment Informed patient of the support team roles and support services at [facility] Provided CSW contact information and encouraged patient to call with any questions or concerns CSW referred patient to her insurance to inquire about transportation to medical appointments.  CSW discussed the process for low income housing and assisted with identifying tools to make current living situation tolerable.  CSW provided instructions  on how to get connected to ongoing  counseling.   Follow Up Plan: CSW will follow-up with patient by phone  Patient verbalizes understanding of plan: Yes  Marguerita Merles, LCSWA Clinical Social Worker [facility]

## 2023-01-28 ENCOUNTER — Telehealth: Payer: Self-pay | Admitting: Nurse Practitioner

## 2023-01-28 NOTE — Telephone Encounter (Signed)
Scheduled appointment per referral. Patient is aware of the made appointment.

## 2023-01-30 ENCOUNTER — Other Ambulatory Visit: Payer: 59

## 2023-02-05 ENCOUNTER — Inpatient Hospital Stay: Payer: 59 | Attending: Hematology and Oncology | Admitting: Licensed Clinical Social Worker

## 2023-02-05 DIAGNOSIS — C7951 Secondary malignant neoplasm of bone: Secondary | ICD-10-CM | POA: Insufficient documentation

## 2023-02-05 DIAGNOSIS — Z170 Estrogen receptor positive status [ER+]: Secondary | ICD-10-CM | POA: Insufficient documentation

## 2023-02-05 DIAGNOSIS — Z79811 Long term (current) use of aromatase inhibitors: Secondary | ICD-10-CM | POA: Insufficient documentation

## 2023-02-05 DIAGNOSIS — F32A Depression, unspecified: Secondary | ICD-10-CM | POA: Insufficient documentation

## 2023-02-05 DIAGNOSIS — C50412 Malignant neoplasm of upper-outer quadrant of left female breast: Secondary | ICD-10-CM | POA: Insufficient documentation

## 2023-02-05 NOTE — Progress Notes (Signed)
[facility] CSW Progress Note  Clinical Child psychotherapist contacted patient by phone for scheduled follow up. Patient reported that she successfully connected with Medication Management (does not recall agency/provider)  and prescribed medications for depression, she is satisfied with medication. P   Patient has not been able to get connected to ongoing outpatient services. CSW offered to send over referral for other provides, and referred patient to current med management agency. Patient selected to wait until her next medication management appt and will f/u with them. Patient reports that she does not need any housing referrals at this time.   CSW will follow up again in 2 weeks.   Marguerita Merles, LCSWA Clinical Social Worker [facility]

## 2023-02-06 NOTE — Progress Notes (Unsigned)
Palliative Medicine [facility]  Telephone:([phone]) [phone] Fax:(336) [phone]   Name: Tracie Atkins Date: 02/06/2023 MRN: [id number]  DOB: 17-Jan-1956  Patient Care Team: Patient, No Pcp Per as PCP - General (General Practice) Tracie Moulds, MD as Consulting Physician (Hematology and Oncology) Tracie Puffer, MD as Consulting Physician (Radiation Oncology) Tracie Lint, MD as Consulting Physician (General Surgery)    REASON FOR CONSULTATION: Tracie Atkins is a 67 y.o. female with oncologic medical history including breast cancer (10/2022) with metastatic disease to the bone, on daily anastrozole. Palliative ask to see for symptom management and goals of care.    SOCIAL HISTORY:     reports that she has never smoked. She has never been exposed to tobacco smoke. She has never used smokeless tobacco. She reports current alcohol use of about 1.0 standard drink of alcohol per week. She reports that she does not use drugs.  ADVANCE DIRECTIVES:  None on file  CODE STATUS: Full code  PAST MEDICAL HISTORY: Past Medical History:  Diagnosis Date   Breast cancer (HCC) 2023   left   Chronic dental infection 8/189   multiple teeth removed and Post -op infection.   Depression    ETOH abuse    Fatty liver    Headache     PAST SURGICAL HISTORY:  Past Surgical History:  Procedure Laterality Date   BREAST LUMPECTOMY WITH RADIOACTIVE SEED AND SENTINEL LYMPH NODE BIOPSY Left 01/15/2022   Procedure: LEFT BREAST SEED LOCALIZED LUMPECTOMY, LEFT SEED TARGETED AXILLARY LYMPH NODE BIOPSY, LEFT SENTINEL LYMPH NODE BIOPSY;  Surgeon: Tracie Lint, MD;  Location: [facility] OR;  Service: General;  Laterality: Left;   DENTAL SURGERY     RADIOACTIVE SEED GUIDED EXCISIONAL BREAST BIOPSY Right 01/15/2022   Procedure: RIGHT BREAST SEED LOCALIZED EXCISIONAL BIOPSY;  Surgeon: Tracie Lint, MD;  Location: [facility] OR;  Service: General;  Laterality: Right;   TUBAL LIGATION      HEMATOLOGY/ONCOLOGY  HISTORY:  Oncology History  Malignant neoplasm of upper-outer quadrant of left breast in female, estrogen receptor positive (HCC)  11/05/2021 Initial Diagnosis   Palpable lump in the left breast with the pain along with right-sided nipple discharge.  Mammogram reveals highly suspicious mass left breast 3 o'clock position 2.2 cm, 2 abnormal left axillary lymph nodes: Biopsy: Grade 2 IDC ER 100%, PR 0%, Ki-67 10%, HER2 2+ by IHC, FISH negative ratio 1.4, axillary lymph node biopsy: IDC, ER 100%, PR 20%, Ki-67 10%, HER2 negative ratio 1.31   11/28/2021 Breast MRI   Left breast cancer 2.7 cm and 0.7 cm linear enhancement UOQ left breast needs biopsy, 0.5 cm mass posterior to the right nipple needs biopsy, 2-lymph nodes left axilla, 1.5 cm sternal lesion   12/05/2021 Cancer Staging   Staging form: Breast, AJCC 8th Edition - Clinical: Stage IIA (cT2, cN1, cM0, G2, ER+, PR+, HER2-) - Signed by Tracie Croissant, MD on 12/05/2021 Stage prefix: Initial diagnosis Histologic grading system: 3 grade system   01/15/2022 Pathology Results   SURGICAL PATHOLOGY  CASE: [id number]  PATIENT: Tracie Atkins  Surgical Pathology Report      Clinical History: left breast cancer (cm)   FINAL MICROSCOPIC DIAGNOSIS:   A. BREAST, LEFT, LUMPECTOMY:  Invasive ductal carcinoma with clear margins of resection.  Please see the synoptic report after specimen D.   B. BREAST, RIGHT, LUMPECTOMY:  Fibrous scar with hemosiderin deposits.  Carcinoma is not identified in the resected specimen.   C. LYMPH NODE, LEFT AXILLARY #2, SENTINEL,  EXCISION:  A lymph node negative for metastatic carcinoma.   D. 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   pTNM classification ( AJCC 8th Edition): pT2, pN1a  Results of prognostic markers performed on prior biopsy from 11/05/2021  ([id number])           ER: Positive in 100% of tumor cells.           PR: Negative.            Ki-67: Positive in 10% of tumor cells.           HER-2/neu: 2+ by IHC/ Negative by FISH.    02/17/2022 Oncotype testing   Oncotype of 11, no benefit from chemotherapy.   03/26/2022 - 04/11/2022 Radiation Therapy   9 fractions totaling 16.2 Gy prior to relocation to [place]   10/20/2022 PET scan   1. Multifocal hypermetabolic skeletal metastasis involving the sternum, T6 vertebral body, T5 and T7 posterior elements, and inferior RIGHT scapula. 2. Hypermetabolic LEFT internal mammary lymph node most consistent with metastatic adenopathy. 3. Postsurgical change in the RIGHT and LEFT breast. No residual carcinoma identified in the breasts or axilla by FDG PET imaging.     10/23/2022 - 11/05/2022 Radiation Therapy   Plan Name: Chest Site: Thorax including T5-7 and sternum Technique: 3D Mode: Photon Dose Per Fraction: 3 Gy Prescribed Dose (Delivered / Prescribed): 30 Gy / 30 Gy Prescribed Fxs (Delivered / Prescribed): 10 / 10   11/05/2022 -  Anti-estrogen oral therapy   Anastrozole daily   12/16/2022 Imaging   IMPRESSION: 1. Osseous metastases T3 through T7 levels as above. Mild chronic compression deformity of T3, possibly pathologic. No extra osseous or epidural extension of tumor. 2. Mild multilevel degenerative spondylosis as above. No significant stenosis or overt neural impingement.     01/16/2023 Cancer Staging   Staging form: Breast, AJCC 8th Edition - Clinical: Stage IV (cM1) - Signed by Tracie Socks, NP on 01/16/2023     ALLERGIES:  has No Known Allergies.  MEDICATIONS:  Current Outpatient Medications  Medication Sig Dispense Refill   anastrozole (ARIMIDEX) 1 MG tablet Take 1 tablet (1 mg total) by mouth daily. 90 tablet 3   diphenhydrAMINE (BENADRYL) 25 mg capsule Take 25 mg by mouth in the morning.     escitalopram (LEXAPRO) 20 MG tablet Take 20 mg by mouth daily.     meloxicam (MOBIC) 7.5 MG tablet Take 1 tablet (7.5 mg total) by mouth daily.  30 tablet 1   montelukast (SINGULAIR) 10 MG tablet Take 10 mg by mouth at bedtime. (Patient not taking: Reported on 01/09/2023)     pantoprazole (PROTONIX) 40 MG tablet Take 40 mg by mouth at bedtime.     topiramate (TOPAMAX) 50 MG tablet Take 50 mg by mouth 2 (two) times daily.     traZODone (DESYREL) 50 MG tablet Take 50 mg by mouth at bedtime as needed for sleep. (Patient not taking: Reported on 01/09/2023)     No current facility-administered medications for this visit.    VITAL SIGNS: There were no vitals taken for this visit. There were no vitals filed for this visit.  Estimated body mass index is 31.5 kg/m as calculated from the following:   Height as of 12/16/22: 5\' 4"  (1.626 m).   Weight as of 01/16/23: 183 lb 8 oz (83.2 kg).  LABS: CBC:    Component Value Date/Time   WBC 5.8 01/16/2023 1036  HGB 13.9 01/16/2023 1036   HCT 42.8 01/16/2023 1036   PLT 224 01/16/2023 1036   MCV 86.3 01/16/2023 1036   NEUTROABS 4.2 01/16/2023 1036   LYMPHSABS 0.8 01/16/2023 1036   MONOABS 0.8 01/16/2023 1036   EOSABS 0.1 01/16/2023 1036   BASOSABS 0.0 01/16/2023 1036   Comprehensive Metabolic Panel:    Component Value Date/Time   NA 140 01/16/2023 1036   K 3.9 01/16/2023 1036   CL 109 01/16/2023 1036   CO2 26 01/16/2023 1036   BUN 12 01/16/2023 1036   CREATININE 0.69 01/16/2023 1036   GLUCOSE 70 01/16/2023 1036   CALCIUM 9.1 01/16/2023 1036   AST 14 (L) 01/16/2023 1036   ALT 14 01/16/2023 1036   ALKPHOS 156 (H) 01/16/2023 1036   BILITOT 0.3 01/16/2023 1036   PROT 6.9 01/16/2023 1036   ALBUMIN 4.0 01/16/2023 1036    RADIOGRAPHIC STUDIES: No results found.  PERFORMANCE STATUS (ECOG) : {CHL ONC ECOG EA:[id number]}  Review of Systems Unless otherwise noted, a complete review of systems is negative.  Physical Exam General: NAD Cardiovascular: regular rate and rhythm Pulmonary: clear ant fields Abdomen: soft, nontender, + bowel sounds Extremities: no edema, no joint  deformities Skin: no rashes Neurological:  IMPRESSION: *** I introduced myself,  RN, and Palliative's role in collaboration with the oncology team. Concept of Palliative Care was introduced as specialized medical care for people and their families living with serious illness.  It focuses on providing relief from the symptoms and stress of a serious illness.  The goal is to improve quality of life for both the patient and the family. Values and goals of care important to patient and family were attempted to be elicited.    We discussed *** current illness and what it means in the larger context of *** on-going co-morbidities. Natural disease trajectory and expectations were discussed.  I discussed the importance of continued conversation with family and their medical providers regarding overall plan of care and treatment options, ensuring decisions are within the context of the patients values and GOCs.  PLAN: Established therapeutic relationship. Education provided on palliative's role in collaboration with their Oncology/Radiation team. I will plan to see patient back in 2-4 weeks in collaboration to other oncology appointments.    Patient expressed understanding and was in agreement with this plan. She also understands that She can call the clinic at any time with any questions, concerns, or complaints.   Thank you for your referral and allowing Palliative to assist in Mrs. Lenda Kelp Buttrey's care.   Number and complexity of problems addressed: ***HIGH - 1 or more chronic illnesses with SEVERE exacerbation, progression, or side effects of treatment - advanced cancer, pain. Any controlled substances utilized were prescribed in the context of palliative care.   Visit consisted of counseling and education dealing with the complex and emotionally intense issues of symptom management and palliative care in the setting of serious and potentially life-threatening illness.Greater than 50%  of  this time was spent counseling and coordinating care related to the above assessment and plan.  Signed by: Willette Alma, AGPCNP-BC Palliative Medicine Team/[facility]   *Please note that this is a verbal dictation therefore any spelling or grammatical errors are due to the "[employer] One" system interpretation.

## 2023-02-09 ENCOUNTER — Inpatient Hospital Stay: Payer: 59 | Admitting: Nurse Practitioner

## 2023-02-17 ENCOUNTER — Telehealth: Payer: Self-pay | Admitting: Nurse Practitioner

## 2023-02-23 ENCOUNTER — Ambulatory Visit (HOSPITAL_COMMUNITY)
Admission: RE | Admit: 2023-02-23 | Discharge: 2023-02-23 | Disposition: A | Discharge status: Home / Self Care | Payer: 59 | Source: Ambulatory Visit | Attending: Adult Health | Admitting: Adult Health

## 2023-02-23 DIAGNOSIS — C7951 Secondary malignant neoplasm of bone: Secondary | ICD-10-CM | POA: Diagnosis present

## 2023-02-23 DIAGNOSIS — C50412 Malignant neoplasm of upper-outer quadrant of left female breast: Secondary | ICD-10-CM | POA: Insufficient documentation

## 2023-02-23 DIAGNOSIS — Z170 Estrogen receptor positive status [ER+]: Secondary | ICD-10-CM | POA: Diagnosis present

## 2023-02-23 MED ORDER — IOHEXOL 300 MG/ML  SOLN
100.0000 mL | Freq: Once | INTRAMUSCULAR | Status: AC | PRN
  Administered 2023-02-23: 100 mL via INTRAVENOUS

## 2023-02-23 MED ORDER — SODIUM CHLORIDE (PF) 0.9 % IJ SOLN
INTRAMUSCULAR | Status: AC
  Filled 2023-02-23: qty 50

## 2023-02-26 ENCOUNTER — Telehealth: Payer: Self-pay

## 2023-02-26 NOTE — Telephone Encounter (Signed)
Pt Tracie Atkins and asked to please call her back. This RN called pt back. Pt expressed concern for pain in her lower back. The pt reported that the pain started two days ago that feels like a "pinched nerve." Pt denies any new numbness; she reports experiencing R leg tingling but states that it has been occurring and is not new. Pt denies any bladder or bowel issues. Pt then scheduled pt for appt for tomorrow 02/27/2023 at 11:15.

## 2023-02-27 ENCOUNTER — Inpatient Hospital Stay (HOSPITAL_BASED_OUTPATIENT_CLINIC_OR_DEPARTMENT_OTHER): Payer: 59 | Admitting: Hematology and Oncology

## 2023-02-27 VITALS — BP 121/70 | HR 80 | Temp 97.7°F | Resp 18 | Ht 64.0 in | Wt 182.7 lb

## 2023-02-27 DIAGNOSIS — C50412 Malignant neoplasm of upper-outer quadrant of left female breast: Secondary | ICD-10-CM

## 2023-02-27 DIAGNOSIS — Z170 Estrogen receptor positive status [ER+]: Secondary | ICD-10-CM | POA: Diagnosis not present

## 2023-02-27 DIAGNOSIS — F32A Depression, unspecified: Secondary | ICD-10-CM | POA: Diagnosis not present

## 2023-02-27 DIAGNOSIS — C7951 Secondary malignant neoplasm of bone: Secondary | ICD-10-CM

## 2023-02-27 DIAGNOSIS — Z79811 Long term (current) use of aromatase inhibitors: Secondary | ICD-10-CM | POA: Diagnosis not present

## 2023-02-27 NOTE — Assessment & Plan Note (Signed)
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  She continues on anastrozole.  Stage IV breast cancer: She continues on anastrozole daily with good tolerance.  I recommended that she continue this.  Guardant360 without any targetable mutations.  Ideally I would like to add CDK 4 6 inhibition to anastrozole.  However given her underlying psychiatric issues, suicidal ideation which has been out of control, we have held on adding this medication.  She apparently is now established with psychiatry.  I will wait for recommendations from them.  Son could not remember the name of her psychiatrist but was instructed to call us back with the name so we can obtain some recent documentation from them.  We have reviewed the most recent scans which shows increased sclerotic lesions but this could very well be from posttreatment.  There appears to be no new disease.  Her internal mammary lymph nodes are smaller which are consistent with response hence overall clinically and radiologically, I believe she is having a response.  I have also encouraged that she should establish with dentistry so we can proceed with bisphosphonates that earliest possibility.   Depression: She must follow-up with psychiatry on a regular basis, depression needs to be stabilized. Lower pain, previously her MRI lumbar spine did not show any evidence of metastatic disease in this area.  She is also following up with pain management.  There appears to be no evidence of cord compression etc.  I have offered to repeat the MRI but she is reluctant.  She will talk to [facility] about optimizing pain management.  There was no comment on any disease in the lumbar spine on her most recent CT scan. Return to clinic in 4 weeks

## 2023-02-27 NOTE — Progress Notes (Signed)
[facility] Cancer Follow up:    Patient, No Pcp Per No address on file   DIAGNOSIS:  Cancer Staging  Malignant neoplasm of upper-outer quadrant of left breast in female, estrogen receptor positive (HCC) Staging form: Breast, AJCC 8th Edition - Clinical: Stage IIA (cT2, cN1, cM0, G2, ER+, PR+, HER2-) - Signed by Serena Croissant, MD on 12/05/2021 Stage prefix: Initial diagnosis Histologic grading system: 3 grade system - Clinical: Stage IV (cM1) - Signed by Loa Socks, NP on 01/16/2023   SUMMARY OF ONCOLOGIC HISTORY: Oncology History  Malignant neoplasm of upper-outer quadrant of left breast in female, estrogen receptor positive (HCC)  11/05/2021 Initial Diagnosis   Palpable lump in the left breast with the pain along with right-sided nipple discharge.  Mammogram reveals highly suspicious mass left breast 3 o'clock position 2.2 cm, 2 abnormal left axillary lymph nodes: Biopsy: Grade 2 IDC ER 100%, PR 0%, Ki-67 10%, HER2 2+ by IHC, FISH negative ratio 1.4, axillary lymph node biopsy: IDC, ER 100%, PR 20%, Ki-67 10%, HER2 negative ratio 1.31   11/28/2021 Breast MRI   Left breast cancer 2.7 cm and 0.7 cm linear enhancement UOQ left breast needs biopsy, 0.5 cm mass posterior to the right nipple needs biopsy, 2-lymph nodes left axilla, 1.5 cm sternal lesion   12/05/2021 Cancer Staging   Staging form: Breast, AJCC 8th Edition - Clinical: Stage IIA (cT2, cN1, cM0, G2, ER+, PR+, HER2-) - Signed by Serena Croissant, MD on 12/05/2021 Stage prefix: Initial diagnosis Histologic grading system: 3 grade system   01/15/2022 Pathology Results   SURGICAL PATHOLOGY  CASE: [id number]  PATIENT: Tracie Atkins  Surgical Pathology Report      Clinical History: left breast cancer (cm)   FINAL MICROSCOPIC DIAGNOSIS:   A. BREAST, LEFT, LUMPECTOMY:  Invasive ductal carcinoma with clear margins of resection.  Please see the synoptic report after specimen D.   B. BREAST, RIGHT,  LUMPECTOMY:  Fibrous scar with hemosiderin deposits.  Carcinoma is not identified in the resected specimen.   C. LYMPH NODE, LEFT AXILLARY #2, SENTINEL, EXCISION:  A lymph node negative for metastatic carcinoma.   D. 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   pTNM classification ( AJCC 8th Edition): pT2, pN1a  Results of prognostic markers performed on prior biopsy from 11/05/2021  ([id number])           ER: Positive in 100% of tumor cells.           PR: Negative.           Ki-67: Positive in 10% of tumor cells.           HER-2/neu: 2+ by IHC/ Negative by FISH.    02/17/2022 Oncotype testing   Oncotype of 11, no benefit from chemotherapy.   03/26/2022 - 04/11/2022 Radiation Therapy   9 fractions totaling 16.2 Gy prior to relocation to [place]   10/20/2022 PET scan   1. Multifocal hypermetabolic skeletal metastasis involving the sternum, T6 vertebral body, T5 and T7 posterior elements, and inferior RIGHT scapula. 2. Hypermetabolic LEFT internal mammary lymph node most consistent with metastatic adenopathy. 3. Postsurgical change in the RIGHT and LEFT breast. No residual carcinoma identified in the breasts or axilla by FDG PET imaging.     10/23/2022 - 11/05/2022 Radiation Therapy   Plan Name: Chest Site: Thorax including T5-7 and sternum Technique: 3D Mode: Photon Dose Per Fraction: 3 Gy Prescribed Dose (Delivered /  Prescribed): 30 Gy / 30 Gy Prescribed Fxs (Delivered / Prescribed): 10 / 10   11/05/2022 -  Anti-estrogen oral therapy   Anastrozole daily   12/16/2022 Imaging   IMPRESSION: 1. Osseous metastases T3 through T7 levels as above. Mild chronic compression deformity of T3, possibly pathologic. No extra osseous or epidural extension of tumor. 2. Mild multilevel degenerative spondylosis as above. No significant stenosis or overt neural impingement.     01/16/2023 Cancer Staging   Staging form:  Breast, AJCC 8th Edition - Clinical: Stage IV (cM1) - Signed by Loa Socks, NP on 01/16/2023     CURRENT THERAPY: Anastrozole  INTERVAL HISTORY:  Tracie Atkins 67 y.o. female returns for f/u of her metastatic breast cancer.   She is accompanied by her son today for follow-up.  Since her last visit here, she had another episode where she was in the ER, had some excessive alcohol.  She however tells me that she has established with psychiatry and they have put her on some medications but we do not have any documentation from them.  She denies any other suicidal intention.  She is very compliant with anastrozole.  She today complains of lower back pain which was not thought to be related to her breast cancer.  She denies any other symptoms for me.  She is interested in staying on anastrozole and following all recommendations.  She is yet to establish with a dentist. Rest of the pertinent 10 point ROS reviewed and negative  Patient Active Problem List   Diagnosis Date Noted   Metastasis to bone (HCC) 01/16/2023   Vitamin D deficiency 01/16/2023   Seasonal allergies 01/16/2023   Obesity (BMI 30.0-34.9) 01/16/2023   Hyperlipidemia 01/16/2023   Essential hypertension 01/16/2023   Alcohol use disorder, severe, dependence (HCC) 11/07/2022   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   Malignant neoplasm of upper-outer quadrant of left breast in female, estrogen receptor positive (HCC) 12/05/2021   Severe episode of recurrent major depressive disorder, without psychotic features (HCC) 08/19/2021   Suicidal ideations    Alcohol abuse with alcohol-induced mood disorder (HCC) 11/20/2016    has No Known Allergies.  MEDICAL HISTORY: Past Medical History:  Diagnosis Date   Breast cancer (HCC) 2023   left   Chronic dental infection 8/189   multiple teeth removed and Post -op infection.   Depression    ETOH abuse    Fatty liver    Headache     SURGICAL HISTORY: Past Surgical History:   Procedure Laterality Date   BREAST LUMPECTOMY WITH RADIOACTIVE SEED AND SENTINEL LYMPH NODE BIOPSY Left 01/15/2022   Procedure: LEFT BREAST SEED LOCALIZED LUMPECTOMY, LEFT SEED TARGETED AXILLARY LYMPH NODE BIOPSY, LEFT SENTINEL LYMPH NODE BIOPSY;  Surgeon: [name], MD;  Location: [facility] OR;  Service: General;  Laterality: Left;   DENTAL SURGERY     RADIOACTIVE SEED GUIDED EXCISIONAL BREAST BIOPSY Right 01/15/2022   Procedure: RIGHT BREAST SEED LOCALIZED EXCISIONAL BIOPSY;  Surgeon: [name], MD;  Location: [facility] OR;  Service: General;  Laterality: Right;   TUBAL LIGATION      SOCIAL HISTORY: Social History   Socioeconomic History   Marital status: Single    Spouse name: Not on file   Number of children: 3   Years of education: Not on file   Highest education level: Not on file  Occupational History   Not on file  Tobacco Use   Smoking status: Never    Passive exposure: Never   Smokeless tobacco:  Never  Vaping Use   Vaping status: Never Used  Substance and Sexual Activity   Alcohol use: Yes    Alcohol/week: 1.0 standard drink of alcohol    Types: 1 Standard drinks or equivalent per week    Comment: 1 mixed (liquor) drink per week   Drug use: No   Sexual activity: Never  Other Topics Concern   Not on file  Social History Narrative   Not on file   Social Determinants of Health   Financial Resource Strain: Not on file  Food Insecurity: No Food Insecurity (10/16/2022)   Hunger Vital Sign    Worried About Running Out of Food in the Last Year: Never true    Ran Out of Food in the Last Year: Never true  Transportation Needs: Unmet Transportation Needs (07/30/2022)   PRAPARE - Administrator, Civil Service (Medical): Yes    Lack of Transportation (Non-Medical): No  Physical Activity: Not on file  Stress: Not on file  Social Connections: Not on file  Intimate Partner Violence: Not At Risk (10/16/2022)   Humiliation, Afraid, Rape, and Kick questionnaire    Fear  of Current or Ex-Partner: No    Emotionally Abused: No    Physically Abused: No    Sexually Abused: No    FAMILY HISTORY: No family history on file.  Review of Systems  Constitutional:  Negative for appetite change, chills, fatigue, fever and unexpected weight change.  HENT:   Negative for hearing loss, lump/mass and trouble swallowing.   Eyes:  Negative for eye problems and icterus.  Respiratory:  Negative for chest tightness, cough and shortness of breath.   Cardiovascular:  Negative for chest pain, leg swelling and palpitations.  Gastrointestinal:  Negative for abdominal distention, abdominal pain, constipation, diarrhea, nausea and vomiting.  Endocrine: Negative for hot flashes.  Genitourinary:  Negative for difficulty urinating.   Musculoskeletal:  Negative for arthralgias.  Skin:  Negative for itching and rash.  Neurological:  Negative for dizziness, extremity weakness, headaches and numbness.  Hematological:  Negative for adenopathy. Does not bruise/bleed easily.  Psychiatric/Behavioral:  Negative for depression and suicidal ideas. The patient is not nervous/anxious.       PHYSICAL EXAMINATION   Vitals:   02/27/23 1107  BP: 121/70  Pulse: 80  Resp: 18  Temp: 97.7 F (36.5 C)  SpO2: 97%    Physical Exam Constitutional:      General: She is not in acute distress.    Appearance: Normal appearance. She is not toxic-appearing.  HENT:     Head: Normocephalic and atraumatic.     Mouth/Throat:     Mouth: Mucous membranes are moist.     Pharynx: Oropharynx is clear. No oropharyngeal exudate or posterior oropharyngeal erythema.  Eyes:     General: No scleral icterus. Cardiovascular:     Rate and Rhythm: Normal rate and regular rhythm.     Pulses: Normal pulses.     Heart sounds: Normal heart sounds.  Pulmonary:     Effort: Pulmonary effort is normal.     Breath sounds: Normal breath sounds.  Abdominal:     General: Abdomen is flat. Bowel sounds are normal. There  is no distension.     Palpations: Abdomen is soft.     Tenderness: There is no abdominal tenderness.  Musculoskeletal:        General: No swelling.     Cervical back: Neck supple.  Lymphadenopathy:     Cervical: No cervical adenopathy.  Skin:  General: Skin is warm and dry.     Findings: No rash.  Neurological:     General: No focal deficit present.     Mental Status: She is alert.  Psychiatric:        Mood and Affect: Mood normal.        Behavior: Behavior normal.     LABORATORY DATA:  CBC    Component Value Date/Time   WBC 5.8 01/16/2023 1036   RBC 4.96 01/16/2023 1036   HGB 13.9 01/16/2023 1036   HCT 42.8 01/16/2023 1036   PLT 224 01/16/2023 1036   MCV 86.3 01/16/2023 1036   MCH 28.0 01/16/2023 1036   MCHC 32.5 01/16/2023 1036   RDW 15.0 01/16/2023 1036   LYMPHSABS 0.8 01/16/2023 1036   MONOABS 0.8 01/16/2023 1036   EOSABS 0.1 01/16/2023 1036   BASOSABS 0.0 01/16/2023 1036    CMP     Component Value Date/Time   NA 140 01/16/2023 1036   K 3.9 01/16/2023 1036   CL 109 01/16/2023 1036   CO2 26 01/16/2023 1036   GLUCOSE 70 01/16/2023 1036   BUN 12 01/16/2023 1036   CREATININE 0.69 01/16/2023 1036   CALCIUM 9.1 01/16/2023 1036   PROT 6.9 01/16/2023 1036   ALBUMIN 4.0 01/16/2023 1036   AST 14 (L) 01/16/2023 1036   ALT 14 01/16/2023 1036   ALKPHOS 156 (H) 01/16/2023 1036   BILITOT 0.3 01/16/2023 1036   GFRNONAA >60 01/16/2023 1036   GFRAA >60 06/12/2018 1323         ASSESSMENT and THERAPY PLAN:   Malignant neoplasm of upper-outer quadrant of left breast in female, estrogen receptor positive (HCC) Tracie Atkins is a 67 year old woman with stage IV breast cancer metastatic to the bone.  She continues on anastrozole.  Stage IV breast cancer: She continues on anastrozole daily with good tolerance.  I recommended that she continue this.  Guardant360 without any targetable mutations.  Ideally I would like to add CDK 4 6 inhibition to anastrozole.  However given  her underlying psychiatric issues, suicidal ideation which has been out of control, we have held on adding this medication.  She apparently is now established with psychiatry.  I will wait for recommendations from them.  Son could not remember the name of her psychiatrist but was instructed to call us back with the name so we can obtain some recent documentation from them.  We have reviewed the most recent scans which shows increased sclerotic lesions but this could very well be from posttreatment.  There appears to be no new disease.  Her internal mammary lymph nodes are smaller which are consistent with response hence overall clinically and radiologically, I believe she is having a response.  I have also encouraged that she should establish with dentistry so we can proceed with bisphosphonates that earliest possibility.   Depression: She must follow-up with psychiatry on a regular basis, depression needs to be stabilized. Lower pain, previously her MRI lumbar spine did not show any evidence of metastatic disease in this area.  She is also following up with pain management.  There appears to be no evidence of cord compression etc.  I have offered to repeat the MRI but she is reluctant.  She will talk to [facility] about optimizing pain management.  There was no comment on any disease in the lumbar spine on her most recent CT scan. Return to clinic in 4 weeks    All questions were answered. The patient knows to call the  clinic with any problems, questions or concerns. We can certainly see the patient much sooner if necessary.  Total encounter time:30 minutes*in face-to-face visit time, chart review, lab review, care coordination, order entry, and documentation of the encounter time.   *Total Encounter Time as defined by the Centers for Medicare and Medicaid Services includes, in addition to the face-to-face time of a patient visit (documented in the note above) non-face-to-face time: obtaining and reviewing  outside history, ordering and reviewing medications, tests or procedures, care coordination (communications with other health care professionals or caregivers) and documentation in the medical record.

## 2023-03-02 ENCOUNTER — Ambulatory Visit: Payer: 59 | Admitting: Nurse Practitioner

## 2023-03-04 NOTE — Progress Notes (Deleted)
Palliative Medicine [facility]  [phone] Fax:([phone]) [phone]   Name: Tracie Atkins Date: 03/04/2023 MRN: [id number]  DOB: 1955-10-09  Patient Care Team: Patient, No Pcp Per as PCP - General (General Practice) Rachel Moulds, MD as Consulting Physician (Hematology and Oncology) Dorothy Puffer, MD as Consulting Physician (Radiation Oncology) [name], MD as Consulting Physician (General Surgery)    REASON FOR CONSULTATION: Tracie Atkins is a 67 y.o. 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  Palliative ask to see for symptom management and goals of care.    SOCIAL HISTORY:     reports that she has never smoked. She has never been exposed to tobacco smoke. She has never used smokeless tobacco. She reports current alcohol use of about 1.0 standard drink of alcohol per week. She reports that she does not use drugs.  ADVANCE DIRECTIVES:  None on file  CODE STATUS: Full code  PAST MEDICAL HISTORY: Past Medical History:  Diagnosis Date   Breast cancer (HCC) 2023   left   Chronic dental infection 8/189   multiple teeth removed and Post -op infection.   Depression    ETOH abuse    Fatty liver    Headache     PAST SURGICAL HISTORY:  Past Surgical History:  Procedure Laterality Date   BREAST LUMPECTOMY WITH RADIOACTIVE SEED AND SENTINEL LYMPH NODE BIOPSY Left 01/15/2022   Procedure: LEFT BREAST SEED LOCALIZED LUMPECTOMY, LEFT SEED TARGETED AXILLARY LYMPH NODE BIOPSY, LEFT SENTINEL LYMPH NODE BIOPSY;  Surgeon: [name], MD;  Location: [facility] OR;  Service: General;  Laterality: Left;   DENTAL SURGERY     RADIOACTIVE SEED GUIDED EXCISIONAL BREAST BIOPSY Right 01/15/2022   Procedure: RIGHT BREAST SEED LOCALIZED EXCISIONAL BIOPSY;  Surgeon: [name], MD;  Location: [facility] OR;  Service: General;  Laterality: Right;   TUBAL LIGATION       HEMATOLOGY/ONCOLOGY HISTORY:  Oncology History  Malignant neoplasm of upper-outer quadrant of left breast in female, estrogen receptor positive (HCC)  11/05/2021 Initial Diagnosis   Palpable lump in the left breast with the pain along with right-sided nipple discharge.  Mammogram reveals highly suspicious mass left breast 3 o'clock position 2.2 cm, 2 abnormal left axillary lymph nodes: Biopsy: Grade 2 IDC ER 100%, PR 0%, Ki-67 10%, HER2 2+ by IHC, FISH negative ratio 1.4, axillary lymph node biopsy: IDC, ER 100%, PR 20%, Ki-67 10%, HER2 negative ratio 1.31   11/28/2021 Breast MRI   Left breast cancer 2.7 cm and 0.7 cm linear enhancement UOQ left breast needs biopsy, 0.5 cm mass posterior to the right nipple needs biopsy, 2-lymph nodes left axilla, 1.5 cm sternal lesion   12/05/2021 Cancer Staging   Staging form: Breast, AJCC 8th Edition - Clinical: Stage IIA (cT2, cN1, cM0, G2, ER+, PR+, HER2-) - Signed by Serena Croissant, MD on 12/05/2021 Stage prefix: Initial diagnosis Histologic grading system: 3 grade system   01/15/2022 Pathology Results   SURGICAL PATHOLOGY  CASE: [id number]  PATIENT: Tracie Atkins  Surgical Pathology Report      Clinical History: left breast cancer (cm)   FINAL MICROSCOPIC DIAGNOSIS:   A. BREAST, LEFT, LUMPECTOMY:  Invasive ductal carcinoma with clear margins of resection.  Please see the synoptic report after specimen D.   B. BREAST, RIGHT, LUMPECTOMY:  Fibrous scar with hemosiderin deposits.  Carcinoma is not identified in the resected specimen.   C. LYMPH NODE,  LEFT AXILLARY #2, SENTINEL, EXCISION:  A lymph node negative for metastatic carcinoma.   D. 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   pTNM classification ( AJCC 8th Edition): pT2, pN1a  Results of prognostic markers performed on prior biopsy from 11/05/2021  ([id number])           ER: Positive in 100% of tumor  cells.           PR: Negative.           Ki-67: Positive in 10% of tumor cells.           HER-2/neu: 2+ by IHC/ Negative by FISH.    02/17/2022 Oncotype testing   Oncotype of 11, no benefit from chemotherapy.   03/26/2022 - 04/11/2022 Radiation Therapy   9 fractions totaling 16.2 Gy prior to relocation to [place]   10/20/2022 PET scan   1. Multifocal hypermetabolic skeletal metastasis involving the sternum, T6 vertebral body, T5 and T7 posterior elements, and inferior RIGHT scapula. 2. Hypermetabolic LEFT internal mammary lymph node most consistent with metastatic adenopathy. 3. Postsurgical change in the RIGHT and LEFT breast. No residual carcinoma identified in the breasts or axilla by FDG PET imaging.     10/23/2022 - 11/05/2022 Radiation Therapy   Plan Name: Chest Site: Thorax including T5-7 and sternum Technique: 3D Mode: Photon Dose Per Fraction: 3 Gy Prescribed Dose (Delivered / Prescribed): 30 Gy / 30 Gy Prescribed Fxs (Delivered / Prescribed): 10 / 10   11/05/2022 -  Anti-estrogen oral therapy   Anastrozole daily   12/16/2022 Imaging   IMPRESSION: 1. Osseous metastases T3 through T7 levels as above. Mild chronic compression deformity of T3, possibly pathologic. No extra osseous or epidural extension of tumor. 2. Mild multilevel degenerative spondylosis as above. No significant stenosis or overt neural impingement.     01/16/2023 Cancer Staging   Staging form: Breast, AJCC 8th Edition - Clinical: Stage IV (cM1) - Signed by Loa Socks, NP on 01/16/2023     ALLERGIES:  has No Known Allergies.  MEDICATIONS:  Current Outpatient Medications  Medication Sig Dispense Refill   anastrozole (ARIMIDEX) 1 MG tablet Take 1 tablet (1 mg total) by mouth daily. 90 tablet 3   diphenhydrAMINE (BENADRYL) 25 mg capsule Take 25 mg by mouth in the morning.     escitalopram (LEXAPRO) 20 MG tablet Take 20 mg by mouth daily.     meloxicam (MOBIC) 7.5 MG tablet Take 1 tablet  (7.5 mg total) by mouth daily. 30 tablet 1   montelukast (SINGULAIR) 10 MG tablet Take 10 mg by mouth at bedtime. (Patient not taking: Reported on 01/09/2023)     pantoprazole (PROTONIX) 40 MG tablet Take 40 mg by mouth at bedtime.     topiramate (TOPAMAX) 50 MG tablet Take 50 mg by mouth 2 (two) times daily.     traZODone (DESYREL) 50 MG tablet Take 50 mg by mouth at bedtime as needed for sleep. (Patient not taking: Reported on 01/09/2023)     No current facility-administered medications for this visit.    VITAL SIGNS: There were no vitals taken for this visit. There were no vitals filed for this visit.  Estimated body mass index is 31.36 kg/m as calculated from the following:   Height as of 02/27/23: 5\' 4"  (1.626 m).   Weight as of 02/27/23: 182 lb 11.2 oz (82.9 kg).  LABS: CBC:    Component Value Date/Time  WBC 5.8 01/16/2023 1036   HGB 13.9 01/16/2023 1036   HCT 42.8 01/16/2023 1036   PLT 224 01/16/2023 1036   MCV 86.3 01/16/2023 1036   NEUTROABS 4.2 01/16/2023 1036   LYMPHSABS 0.8 01/16/2023 1036   MONOABS 0.8 01/16/2023 1036   EOSABS 0.1 01/16/2023 1036   BASOSABS 0.0 01/16/2023 1036   Comprehensive Metabolic Panel:    Component Value Date/Time   NA 140 01/16/2023 1036   K 3.9 01/16/2023 1036   CL 109 01/16/2023 1036   CO2 26 01/16/2023 1036   BUN 12 01/16/2023 1036   CREATININE 0.69 01/16/2023 1036   GLUCOSE 70 01/16/2023 1036   CALCIUM 9.1 01/16/2023 1036   AST 14 (L) 01/16/2023 1036   ALT 14 01/16/2023 1036   ALKPHOS 156 (H) 01/16/2023 1036   BILITOT 0.3 01/16/2023 1036   PROT 6.9 01/16/2023 1036   ALBUMIN 4.0 01/16/2023 1036    RADIOGRAPHIC STUDIES: CT CHEST ABDOMEN PELVIS W CONTRAST  Result Date: 02/26/2023 CLINICAL DATA:  Stage IV invasive breast cancer. Assess treatment response. * Tracking Code: BO * EXAM: CT CHEST, ABDOMEN, AND PELVIS WITH CONTRAST TECHNIQUE: Multidetector CT imaging of the chest, abdomen and pelvis was performed following the standard  protocol during bolus administration of intravenous contrast. RADIATION DOSE REDUCTION: This exam was performed according to the departmental dose-optimization program which includes automated exposure control, adjustment of the mA and/or kV according to patient size and/or use of iterative reconstruction technique. CONTRAST:  OMNIPAQUE IOHEXOL 300 MG/ML  SOLN COMPARISON:  CT angiogram chest 11/18/2022.  PET-CT scan 10/20/2022 FINDINGS: CT CHEST FINDINGS Cardiovascular: Trace pericardial fluid. Heart is nonenlarged. The thoracic aorta has a normal course and caliber with some mild calcified atherosclerotic plaque Mediastinum/Nodes: Moderate-to-large hiatal hernia with patulous esophagus. Slightly heterogeneous thyroid gland with some punctate calcifications. No specific abnormal lymph node enlargement identified in the axillary regions, hilum, mediastinum, chest wall, supraclavicular. On the prior PET-CT there was a hypermetabolic left internal mammary chain lymph node. This has not clearly seen today. Only a small node in this location on series 2, image 27 measures 3 x 4 mm. Lungs/Pleura: Breathing motion. Dependent atelectasis. No consolidation, pneumothorax or effusion. No dominant lung nodule identified. Eventration of the right hemidiaphragm again identified. Musculoskeletal: Once again there are degenerative changes along the spine. Stable sclerotic bone lesion involving the thoracic spine at T5 but also subtle areas of the vertebral bodies such as T4, T3, T6, T7. The areas of sclerosis are increased from the study of May 2014 including along the posterior elements of the upper thoracic spine. There is also a presumed pathologic fracture of the upper sternum with more sclerosis today. There is lucent lesion in this location. Old left-sided posterior rib fractures are identified. Surgical clips identified along the left breast with a small fluid collection. Previously this area had measured 3.5 x 3.2 cm  and today 3.9 x 2.3 cm. Likely evolving postsurgical changes. Attention on follow-up and correlate with mammography. CT ABDOMEN PELVIS FINDINGS Hepatobiliary: No focal liver abnormality is seen. Status post cholecystectomy. No biliary dilatation. Pancreas: Moderate global fatty atrophy of the pancreas. Some sparing towards the tail, unchanged from previous. Spleen: Normal in size without focal abnormality.  Stable splenule. Adrenals/Urinary Tract: Adrenal glands are preserved. Parapelvic renal cysts are identified. There is also an upper pole Bosniak 1 left-sided parenchymal cyst measuring 19 mm in diameter with Hounsfield unit of 3. No specific imaging follow-up. No separate enhancing mass or collecting system dilatation. The ureters have  normal course and caliber extending down to the bladder Stomach/Bowel: Diffuse colonic stool with scattered colonic diverticula. Normal appendix. Again there is a large hiatal hernia. The stomach and small bowel are nondilated. Vascular/Lymphatic: Aortic atherosclerosis. No enlarged abdominal or pelvic lymph nodes. There are some small less than 1 cm size retroperitoneal upper abdominal nodes, nonpathologic by size criteria. A few tiny mesenteric nodes are also identified, nonspecific. Reproductive: Uterus and bilateral adnexa are unremarkable. Other: No free air or free fluid. Small fat containing umbilical hernia. Small fat containing epigastric hernia in the midline as well. Pelvic prolapse suggested with an anterior rectocele. Please correlate with any history or symptoms. Musculoskeletal: Diffuse degenerative changes of the spine and pelvis posterior osteophytes and disc bulging seen at multiple levels in the lumbar spine with some stenosis. IMPRESSION: Progressive sclerotic bone lesions along the thoracic spine compared to the prior CT scan of May 2024. Please correlate with the MRI examination of June 2024 of the thoracic and lumbar spine. Persistent pathologic fracture  involving lesion of the upper sternum. No developing new mass lesion, fluid collection or lymph node enlargement. The small internal mammary chain lymph node which was hypermetabolic on the previous examination appears smaller today The fluid collection along the left breast adjacent to the surgical clips appears slightly smaller today could be maturation of surgical change. Large hiatal hernia. Colonic diverticulosis. Electronically Signed   By: Karen Kays M.D.   On: 02/26/2023 16:02    PERFORMANCE STATUS (ECOG) : {CHL ONC ECOG [id number]}  Review of Systems Unless otherwise noted, a complete review of systems is negative.  Physical Exam General: NAD Cardiovascular: regular rate and rhythm Pulmonary: clear ant fields Abdomen: soft, nontender, + bowel sounds Extremities: no edema, no joint deformities Skin: no rashes Neurological:  IMPRESSION: *** I introduced myself, Eda Magnussen RN, and Palliative's role in collaboration with the oncology team. Concept of Palliative Care was introduced as specialized medical care for people and their families living with serious illness.  It focuses on providing relief from the symptoms and stress of a serious illness.  The goal is to improve quality of life for both the patient and the family. Values and goals of care important to patient and family were attempted to be elicited.    We discussed *** current illness and what it means in the larger context of *** on-going co-morbidities. Natural disease trajectory and expectations were discussed.  I discussed the importance of continued conversation with family and their medical providers regarding overall plan of care and treatment options, ensuring decisions are within the context of the patients values and GOCs.  PLAN: Established therapeutic relationship. Education provided on palliative's role in collaboration with their Oncology/Radiation team. I will plan to see patient back in 2-4 weeks in  collaboration to other oncology appointments.    Patient expressed understanding and was in agreement with this plan. She also understands that She can call the clinic at any time with any questions, concerns, or complaints.   Thank you for your referral and allowing Palliative to assist in Mrs. Lenda Kelp Siegrist's care.   Number and complexity of problems addressed: ***HIGH - 1 or more chronic illnesses with SEVERE exacerbation, progression, or side effects of treatment - advanced cancer, pain. Any controlled substances utilized were prescribed in the context of palliative care.   Visit consisted of counseling and education dealing with the complex and emotionally intense issues of symptom management and palliative care in the setting of serious and potentially life-threatening illness.Greater  than 50%  of this time was spent counseling and coordinating care related to the above assessment and plan.  Signed by: Willette Alma, AGPCNP-BC Palliative Medicine Team/[facility]   *Please note that this is a verbal dictation therefore any spelling or grammatical errors are due to the "[employer]" system interpretation.

## 2023-03-05 ENCOUNTER — Inpatient Hospital Stay: Payer: 59 | Admitting: Hematology and Oncology

## 2023-03-05 ENCOUNTER — Inpatient Hospital Stay: Payer: 59 | Admitting: Nurse Practitioner

## 2023-03-05 ENCOUNTER — Inpatient Hospital Stay: Payer: 59

## 2023-03-16 ENCOUNTER — Other Ambulatory Visit: Payer: Self-pay

## 2023-03-16 ENCOUNTER — Inpatient Hospital Stay: Payer: 59 | Attending: Hematology and Oncology

## 2023-03-16 ENCOUNTER — Inpatient Hospital Stay (HOSPITAL_BASED_OUTPATIENT_CLINIC_OR_DEPARTMENT_OTHER): Payer: 59 | Admitting: Hematology and Oncology

## 2023-03-16 VITALS — BP 142/77 | HR 90 | Temp 97.7°F | Resp 18 | Ht 64.0 in | Wt 186.9 lb

## 2023-03-16 DIAGNOSIS — R059 Cough, unspecified: Secondary | ICD-10-CM | POA: Diagnosis not present

## 2023-03-16 DIAGNOSIS — F1090 Alcohol use, unspecified, uncomplicated: Secondary | ICD-10-CM | POA: Diagnosis not present

## 2023-03-16 DIAGNOSIS — C50412 Malignant neoplasm of upper-outer quadrant of left female breast: Secondary | ICD-10-CM

## 2023-03-16 DIAGNOSIS — Z79811 Long term (current) use of aromatase inhibitors: Secondary | ICD-10-CM | POA: Diagnosis not present

## 2023-03-16 DIAGNOSIS — Z170 Estrogen receptor positive status [ER+]: Secondary | ICD-10-CM | POA: Insufficient documentation

## 2023-03-16 DIAGNOSIS — C7951 Secondary malignant neoplasm of bone: Secondary | ICD-10-CM | POA: Insufficient documentation

## 2023-03-16 DIAGNOSIS — M25561 Pain in right knee: Secondary | ICD-10-CM | POA: Diagnosis not present

## 2023-03-16 DIAGNOSIS — F32A Depression, unspecified: Secondary | ICD-10-CM | POA: Diagnosis not present

## 2023-03-16 DIAGNOSIS — Z923 Personal history of irradiation: Secondary | ICD-10-CM | POA: Diagnosis not present

## 2023-03-16 LAB — COMPREHENSIVE METABOLIC PANEL
ALT: 14 U/L (ref 0–44)
AST: 15 U/L (ref 15–41)
Albumin: 3.7 g/dL (ref 3.5–5.0)
Alkaline Phosphatase: 142 U/L — ABNORMAL HIGH (ref 38–126)
Anion gap: 5 (ref 5–15)
BUN: 9 mg/dL (ref 8–23)
CO2: 29 mmol/L (ref 22–32)
Calcium: 8.7 mg/dL — ABNORMAL LOW (ref 8.9–10.3)
Chloride: 108 mmol/L (ref 98–111)
Creatinine, Ser: 0.61 mg/dL (ref 0.44–1.00)
GFR, Estimated: >60 mL/min (ref >60)
Glucose, Bld: 104 mg/dL — ABNORMAL HIGH (ref 70–99)
Potassium: 3.4 mmol/L — ABNORMAL LOW (ref 3.5–5.1)
Sodium: 142 mmol/L (ref 135–145)
Total Bilirubin: 0.6 mg/dL (ref 0.3–1.2)
Total Protein: 6.5 g/dL (ref 6.5–8.1)

## 2023-03-16 LAB — CBC WITH DIFFERENTIAL/PLATELET
Abs Immature Granulocytes: 0.02 10*3/uL (ref 0.00–0.07)
Basophils Absolute: 0 10*3/uL (ref 0.0–0.1)
Basophils Relative: 0 %
Eosinophils Absolute: 0.1 10*3/uL (ref 0.0–0.5)
Eosinophils Relative: 3 %
HCT: 39.8 % (ref 36.0–46.0)
Hemoglobin: 13 g/dL (ref 12.0–15.0)
Immature Granulocytes: 0 %
Lymphocytes Relative: 12 %
Lymphs Abs: 0.6 10*3/uL — ABNORMAL LOW (ref 0.7–4.0)
MCH: 28.3 pg (ref 26.0–34.0)
MCHC: 32.7 g/dL (ref 30.0–36.0)
MCV: 86.5 fL (ref 80.0–100.0)
Monocytes Absolute: 0.4 10*3/uL (ref 0.1–1.0)
Monocytes Relative: 8 %
Neutro Abs: 3.9 10*3/uL (ref 1.7–7.7)
Neutrophils Relative %: 77 %
Platelets: 214 10*3/uL (ref 150–400)
RBC: 4.6 MIL/uL (ref 3.87–5.11)
RDW: 14.9 % (ref 11.5–15.5)
WBC: 5.1 10*3/uL (ref 4.0–10.5)
nRBC: 0 % (ref 0.0–0.2)

## 2023-03-16 NOTE — Progress Notes (Signed)
[facility] Cancer Follow up:    Patient, No Pcp Per No address on file   DIAGNOSIS:  Cancer Staging  Malignant neoplasm of upper-outer quadrant of left breast in female, estrogen receptor positive (HCC) Staging form: Breast, AJCC 8th Edition - Clinical: Stage IIA (cT2, cN1, cM0, G2, ER+, PR+, HER2-) - Signed by Serena Croissant, MD on 12/05/2021 Stage prefix: Initial diagnosis Histologic grading system: 3 grade system - Clinical: Stage IV (cM1) - Signed by Loa Socks, NP on 01/16/2023   SUMMARY OF ONCOLOGIC HISTORY: Oncology History  Malignant neoplasm of upper-outer quadrant of left breast in female, estrogen receptor positive (HCC)  11/05/2021 Initial Diagnosis   Palpable lump in the left breast with the pain along with right-sided nipple discharge.  Mammogram reveals highly suspicious mass left breast 3 o'clock position 2.2 cm, 2 abnormal left axillary lymph nodes: Biopsy: Grade 2 IDC ER 100%, PR 0%, Ki-67 10%, HER2 2+ by IHC, FISH negative ratio 1.4, axillary lymph node biopsy: IDC, ER 100%, PR 20%, Ki-67 10%, HER2 negative ratio 1.31   11/28/2021 Breast MRI   Left breast cancer 2.7 cm and 0.7 cm linear enhancement UOQ left breast needs biopsy, 0.5 cm mass posterior to the right nipple needs biopsy, 2-lymph nodes left axilla, 1.5 cm sternal lesion   12/05/2021 Cancer Staging   Staging form: Breast, AJCC 8th Edition - Clinical: Stage IIA (cT2, cN1, cM0, G2, ER+, PR+, HER2-) - Signed by Serena Croissant, MD on 12/05/2021 Stage prefix: Initial diagnosis Histologic grading system: 3 grade system   01/15/2022 Pathology Results   SURGICAL PATHOLOGY  CASE: [id number]  PATIENT: Tracie Atkins  Surgical Pathology Report      Clinical History: left breast cancer (cm)   FINAL MICROSCOPIC DIAGNOSIS:   A. BREAST, LEFT, LUMPECTOMY:  Invasive ductal carcinoma with clear margins of resection.  Please see the synoptic report after specimen D.   B. BREAST, RIGHT,  LUMPECTOMY:  Fibrous scar with hemosiderin deposits.  Carcinoma is not identified in the resected specimen.   C. LYMPH NODE, LEFT AXILLARY #2, SENTINEL, EXCISION:  A lymph node negative for metastatic carcinoma.   D. 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   pTNM classification ( AJCC 8th Edition): pT2, pN1a  Results of prognostic markers performed on prior biopsy from 11/05/2021  ([id number])           ER: Positive in 100% of tumor cells.           PR: Negative.           Ki-67: Positive in 10% of tumor cells.           HER-2/neu: 2+ by IHC/ Negative by FISH.    02/17/2022 Oncotype testing   Oncotype of 11, no benefit from chemotherapy.   03/26/2022 - 04/11/2022 Radiation Therapy   9 fractions totaling 16.2 Gy prior to relocation to [place]   10/20/2022 PET scan   1. Multifocal hypermetabolic skeletal metastasis involving the sternum, T6 vertebral body, T5 and T7 posterior elements, and inferior RIGHT scapula. 2. Hypermetabolic LEFT internal mammary lymph node most consistent with metastatic adenopathy. 3. Postsurgical change in the RIGHT and LEFT breast. No residual carcinoma identified in the breasts or axilla by FDG PET imaging.     10/23/2022 - 11/05/2022 Radiation Therapy   Plan Name: Chest Site: Thorax including T5-7 and sternum Technique: 3D Mode: Photon Dose Per Fraction: 3 Gy Prescribed Dose (Delivered /  Prescribed): 30 Gy / 30 Gy Prescribed Fxs (Delivered / Prescribed): 10 / 10   11/05/2022 -  Anti-estrogen oral therapy   Anastrozole daily   12/16/2022 Imaging   IMPRESSION: 1. Osseous metastases T3 through T7 levels as above. Mild chronic compression deformity of T3, possibly pathologic. No extra osseous or epidural extension of tumor. 2. Mild multilevel degenerative spondylosis as above. No significant stenosis or overt neural impingement.     01/16/2023 Cancer Staging   Staging form:  Breast, AJCC 8th Edition - Clinical: Stage IV (cM1) - Signed by Loa Socks, NP on 01/16/2023     CURRENT THERAPY: Anastrozole  INTERVAL HISTORY:  Tracie Atkins 67 y.o. female returns for f/u of her metastatic breast cancer.   She is accompanied by her son today for follow-up.  She continues to drink alcohol. She continues to have ongoing social situation where she has no place to live. She said she has some right knee issues where her knee tends to give out. She hasn't gone to psychiatry yet. She says the living situation continues to keep her depressed. Her son who is with her here says she has no where to live so have to go back to [place] and she has nothing there either except the streets. She is otherwise taking the anastrozole. Rest of the pertinent 10 point ROS reviewed and negative  Patient Active Problem List   Diagnosis Date Noted   Metastasis to bone (HCC) 01/16/2023   Vitamin D deficiency 01/16/2023   Seasonal allergies 01/16/2023   Obesity (BMI 30.0-34.9) 01/16/2023   Hyperlipidemia 01/16/2023   Essential hypertension 01/16/2023   Alcohol use disorder, severe, dependence (HCC) 11/07/2022   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   Malignant neoplasm of upper-outer quadrant of left breast in female, estrogen receptor positive (HCC) 12/05/2021   Severe episode of recurrent major depressive disorder, without psychotic features (HCC) 08/19/2021   Suicidal ideations    Alcohol abuse with alcohol-induced mood disorder (HCC) 11/20/2016    has No Known Allergies.  MEDICAL HISTORY: Past Medical History:  Diagnosis Date   Breast cancer (HCC) 2023   left   Chronic dental infection 8/189   multiple teeth removed and Post -op infection.   Depression    ETOH abuse    Fatty liver    Headache     SURGICAL HISTORY: Past Surgical History:  Procedure Laterality Date   BREAST LUMPECTOMY WITH RADIOACTIVE SEED AND SENTINEL LYMPH NODE BIOPSY Left 01/15/2022   Procedure: LEFT  BREAST SEED LOCALIZED LUMPECTOMY, LEFT SEED TARGETED AXILLARY LYMPH NODE BIOPSY, LEFT SENTINEL LYMPH NODE BIOPSY;  Surgeon: [name], MD;  Location: [facility] OR;  Service: General;  Laterality: Left;   DENTAL SURGERY     RADIOACTIVE SEED GUIDED EXCISIONAL BREAST BIOPSY Right 01/15/2022   Procedure: RIGHT BREAST SEED LOCALIZED EXCISIONAL BIOPSY;  Surgeon: [name], MD;  Location: [facility] OR;  Service: General;  Laterality: Right;   TUBAL LIGATION      SOCIAL HISTORY: Social History   Socioeconomic History   Marital status: Single    Spouse name: Not on file   Number of children: 3   Years of education: Not on file   Highest education level: Not on file  Occupational History   Not on file  Tobacco Use   Smoking status: Never    Passive exposure: Never   Smokeless tobacco: Never  Vaping Use   Vaping status: Never Used  Substance and Sexual Activity   Alcohol use: Yes  Alcohol/week: 1.0 standard drink of alcohol    Types: 1 Standard drinks or equivalent per week    Comment: 1 mixed (liquor) drink per week   Drug use: No   Sexual activity: Never  Other Topics Concern   Not on file  Social History Narrative   Not on file   Social Determinants of Health   Financial Resource Strain: Not on file  Food Insecurity: No Food Insecurity (10/16/2022)   Hunger Vital Sign    Worried About Running Out of Food in the Last Year: Never true    Ran Out of Food in the Last Year: Never true  Transportation Needs: Unmet Transportation Needs (07/30/2022)   PRAPARE - Administrator, Civil Service (Medical): Yes    Lack of Transportation (Non-Medical): No  Physical Activity: Not on file  Stress: Not on file  Social Connections: Not on file  Intimate Partner Violence: Not At Risk (10/16/2022)   Humiliation, Afraid, Rape, and Kick questionnaire    Fear of Current or Ex-Partner: No    Emotionally Abused: No    Physically Abused: No    Sexually Abused: No    FAMILY HISTORY: No  family history on file.  Review of Systems  Constitutional:  Negative for appetite change, chills, fatigue, fever and unexpected weight change.  HENT:   Negative for hearing loss, lump/mass and trouble swallowing.   Eyes:  Negative for eye problems and icterus.  Respiratory:  Negative for chest tightness, cough and shortness of breath.   Cardiovascular:  Negative for chest pain, leg swelling and palpitations.  Gastrointestinal:  Negative for abdominal distention, abdominal pain, constipation, diarrhea, nausea and vomiting.  Endocrine: Negative for hot flashes.  Genitourinary:  Negative for difficulty urinating.   Musculoskeletal:  Negative for arthralgias.  Skin:  Negative for itching and rash.  Neurological:  Negative for dizziness, extremity weakness, headaches and numbness.  Hematological:  Negative for adenopathy. Does not bruise/bleed easily.  Psychiatric/Behavioral:  Negative for depression and suicidal ideas. The patient is not nervous/anxious.       PHYSICAL EXAMINATION   Vitals:   03/16/23 0836  BP: (!) 142/77  Pulse: 90  Resp: 18  Temp: 97.7 F (36.5 C)  SpO2: 97%    Physical Exam Constitutional:      General: She is not in acute distress.    Appearance: Normal appearance. She is not toxic-appearing.  HENT:     Head: Normocephalic and atraumatic.     Mouth/Throat:     Mouth: Mucous membranes are moist.     Pharynx: Oropharynx is clear. No oropharyngeal exudate or posterior oropharyngeal erythema.  Eyes:     General: No scleral icterus. Cardiovascular:     Rate and Rhythm: Normal rate and regular rhythm.     Pulses: Normal pulses.     Heart sounds: Normal heart sounds.  Pulmonary:     Effort: Pulmonary effort is normal.     Breath sounds: Normal breath sounds.  Abdominal:     General: Abdomen is flat. Bowel sounds are normal. There is no distension.     Palpations: Abdomen is soft.     Tenderness: There is no abdominal tenderness.  Musculoskeletal:         General: No swelling.     Cervical back: Neck supple.  Lymphadenopathy:     Cervical: No cervical adenopathy.  Skin:    General: Skin is warm and dry.     Findings: No rash.  Neurological:  General: No focal deficit present.     Mental Status: She is alert.  Psychiatric:        Mood and Affect: Mood normal.        Behavior: Behavior normal.     LABORATORY DATA:  CBC    Component Value Date/Time   WBC 5.1 03/16/2023 0822   RBC 4.60 03/16/2023 0822   HGB 13.0 03/16/2023 0822   HCT 39.8 03/16/2023 0822   PLT 214 03/16/2023 0822   MCV 86.5 03/16/2023 0822   MCH 28.3 03/16/2023 0822   MCHC 32.7 03/16/2023 0822   RDW 14.9 03/16/2023 0822   LYMPHSABS 0.6 (L) 03/16/2023 0822   MONOABS 0.4 03/16/2023 0822   EOSABS 0.1 03/16/2023 0822   BASOSABS 0.0 03/16/2023 0822    CMP     Component Value Date/Time   NA 140 01/16/2023 1036   K 3.9 01/16/2023 1036   CL 109 01/16/2023 1036   CO2 26 01/16/2023 1036   GLUCOSE 70 01/16/2023 1036   BUN 12 01/16/2023 1036   CREATININE 0.69 01/16/2023 1036   CALCIUM 9.1 01/16/2023 1036   PROT 6.9 01/16/2023 1036   ALBUMIN 4.0 01/16/2023 1036   AST 14 (L) 01/16/2023 1036   ALT 14 01/16/2023 1036   ALKPHOS 156 (H) 01/16/2023 1036   BILITOT 0.3 01/16/2023 1036   GFRNONAA >60 01/16/2023 1036   GFRAA >60 06/12/2018 1323         ASSESSMENT and THERAPY PLAN:   Malignant neoplasm of upper-outer quadrant of left breast in female, estrogen receptor positive (HCC) Tracie Atkins is a 67 year old woman with stage IV breast cancer metastatic to the bone.  She continues on anastrozole.  Stage IV breast cancer: She continues on anastrozole daily with good tolerance.  I recommended that she continue this. Guardant360 without any targetable mutations.  Ideally I would like to add CDK 4 6 inhibition to anastrozole.  However given her underlying psychiatric issues, suicidal ideation which has been out of control, and no regular follow ups, she  frequently no shows, we have held on adding this.  Today her other son tells me that she may have to go back to [place] and stay homeless and she has no place to live here.  She is working with a Child psychotherapist but she did not have any success with the living situation yet.  She continues to drink alcohol.  We have reviewed the most recent scans which shows increased sclerotic lesions but this could very well be from posttreatment.  There appears to be no new disease.  Her internal mammary lymph nodes are smaller which are consistent with response hence overall clinically and radiologically, I believe she is having a response.  At this we are unable to add bisphosphonates as well since she has no dental evaluation. Depression: She must follow-up with psychiatry on a regular basis, depression needs to be stabilized.  She is unable to do this once again because of her living situation and ongoing social stressors.  She says this continues to remain uncontrolled, did not mention of any active suicidal ideation today. Lower pain, there appears to be no evidence of metastatic disease contributing to this pain.  She is reluctant to do another MRI.  She is working with our pain management. Return to clinic in 4 weeks     All questions were answered. The patient knows to call the clinic with any problems, questions or concerns. We can certainly see the patient much sooner if necessary.  Total encounter time:30 minutes*in face-to-face visit time, chart review, lab review, care coordination, order entry, and documentation of the encounter time.   *Total Encounter Time as defined by the Centers for Medicare and Medicaid Services includes, in addition to the face-to-face time of a patient visit (documented in the note above) non-face-to-face time: obtaining and reviewing outside history, ordering and reviewing medications, tests or procedures, care coordination (communications with other health care professionals  or caregivers) and documentation in the medical record.

## 2023-03-16 NOTE — Assessment & Plan Note (Addendum)
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  She continues on anastrozole.  Stage IV breast cancer: She continues on anastrozole daily with good tolerance.  I recommended that she continue this. Guardant360 without any targetable mutations.  Ideally I would like to add CDK 4 6 inhibition to anastrozole.  However given her underlying psychiatric issues, suicidal ideation which has been out of control, and no regular follow ups, she frequently no shows, we have held on adding this.  Today her other son tells me that she may have to go back to [place] and stay homeless and she has no place to live here.  She is working with a Child psychotherapist but she did not have any success with the living situation yet.  She continues to drink alcohol.  We have reviewed the most recent scans which shows increased sclerotic lesions but this could very well be from posttreatment.  There appears to be no new disease.  Her internal mammary lymph nodes are smaller which are consistent with response hence overall clinically and radiologically, I believe she is having a response.  At this we are unable to add bisphosphonates as well since she has no dental evaluation. Depression: She must follow-up with psychiatry on a regular basis, depression needs to be stabilized.  She is unable to do this once again because of her living situation and ongoing social stressors.  She says this continues to remain uncontrolled, did not mention of any active suicidal ideation today. Lower pain, there appears to be no evidence of metastatic disease contributing to this pain.  She is reluctant to do another MRI.  She is working with our pain management. Return to clinic in 4 weeks

## 2023-03-23 ENCOUNTER — Inpatient Hospital Stay: Payer: 59

## 2023-03-23 ENCOUNTER — Telehealth: Payer: Self-pay

## 2023-03-23 NOTE — Progress Notes (Deleted)
Palliative Medicine [facility]  Telephone:([phone]) [phone] Fax:([phone]) [phone]   Name: Tracie Atkins Date: 03/23/2023 MRN: [id number]  DOB: 1955/07/08  Patient Care Team: Patient, No Pcp Per as PCP - General (General Practice) Rachel Moulds, MD as Consulting Physician (Hematology and Oncology) Dorothy Puffer, MD as Consulting Physician (Radiation Oncology) [name], MD as Consulting Physician (General Surgery)    REASON FOR CONSULTATION: Tracie Atkins is a 67 y.o. 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  Palliative ask to see for symptom management and goals of care.    SOCIAL HISTORY:     reports that she has never smoked. She has never been exposed to tobacco smoke. She has never used smokeless tobacco. She reports current alcohol use of about 1.0 standard drink of alcohol per week. She reports that she does not use drugs.  ADVANCE DIRECTIVES:  None on file  CODE STATUS: Full code  PAST MEDICAL HISTORY: Past Medical History:  Diagnosis Date   Breast cancer (HCC) 2023   left   Chronic dental infection 8/189   multiple teeth removed and Post -op infection.   Depression    ETOH abuse    Fatty liver    Headache     PAST SURGICAL HISTORY:  Past Surgical History:  Procedure Laterality Date   BREAST LUMPECTOMY WITH RADIOACTIVE SEED AND SENTINEL LYMPH NODE BIOPSY Left 01/15/2022   Procedure: LEFT BREAST SEED LOCALIZED LUMPECTOMY, LEFT SEED TARGETED AXILLARY LYMPH NODE BIOPSY, LEFT SENTINEL LYMPH NODE BIOPSY;  Surgeon: [name], MD;  Location: [facility] OR;  Service: General;  Laterality: Left;   DENTAL SURGERY     RADIOACTIVE SEED GUIDED EXCISIONAL BREAST BIOPSY Right 01/15/2022   Procedure: RIGHT BREAST SEED LOCALIZED EXCISIONAL BIOPSY;  Surgeon: [name], MD;  Location: [facility] OR;  Service: General;  Laterality: Right;   TUBAL LIGATION       HEMATOLOGY/ONCOLOGY HISTORY:  Oncology History  Malignant neoplasm of upper-outer quadrant of left breast in female, estrogen receptor positive (HCC)  11/05/2021 Initial Diagnosis   Palpable lump in the left breast with the pain along with right-sided nipple discharge.  Mammogram reveals highly suspicious mass left breast 3 o'clock position 2.2 cm, 2 abnormal left axillary lymph nodes: Biopsy: Grade 2 IDC ER 100%, PR 0%, Ki-67 10%, HER2 2+ by IHC, FISH negative ratio 1.4, axillary lymph node biopsy: IDC, ER 100%, PR 20%, Ki-67 10%, HER2 negative ratio 1.31   11/28/2021 Breast MRI   Left breast cancer 2.7 cm and 0.7 cm linear enhancement UOQ left breast needs biopsy, 0.5 cm mass posterior to the right nipple needs biopsy, 2-lymph nodes left axilla, 1.5 cm sternal lesion   12/05/2021 Cancer Staging   Staging form: Breast, AJCC 8th Edition - Clinical: Stage IIA (cT2, cN1, cM0, G2, ER+, PR+, HER2-) - Signed by Serena Croissant, MD on 12/05/2021 Stage prefix: Initial diagnosis Histologic grading system: 3 grade system   01/15/2022 Pathology Results   SURGICAL PATHOLOGY  CASE: [id number]  PATIENT: Tracie Atkins  Surgical Pathology Report      Clinical History: left breast cancer (cm)   FINAL MICROSCOPIC DIAGNOSIS:   A. BREAST, LEFT, LUMPECTOMY:  Invasive ductal carcinoma with clear margins of resection.  Please see the synoptic report after specimen D.   B. BREAST, RIGHT, LUMPECTOMY:  Fibrous scar with hemosiderin deposits.  Carcinoma is not identified in the resected specimen.   C. LYMPH NODE,  LEFT AXILLARY #2, SENTINEL, EXCISION:  A lymph node negative for metastatic carcinoma.   D. 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   pTNM classification ( AJCC 8th Edition): pT2, pN1a  Results of prognostic markers performed on prior biopsy from 11/05/2021  ([id number])           ER: Positive in 100% of tumor  cells.           PR: Negative.           Ki-67: Positive in 10% of tumor cells.           HER-2/neu: 2+ by IHC/ Negative by FISH.    02/17/2022 Oncotype testing   Oncotype of 11, no benefit from chemotherapy.   03/26/2022 - 04/11/2022 Radiation Therapy   9 fractions totaling 16.2 Gy prior to relocation to [place]   10/20/2022 PET scan   1. Multifocal hypermetabolic skeletal metastasis involving the sternum, T6 vertebral body, T5 and T7 posterior elements, and inferior RIGHT scapula. 2. Hypermetabolic LEFT internal mammary lymph node most consistent with metastatic adenopathy. 3. Postsurgical change in the RIGHT and LEFT breast. No residual carcinoma identified in the breasts or axilla by FDG PET imaging.     10/23/2022 - 11/05/2022 Radiation Therapy   Plan Name: Chest Site: Thorax including T5-7 and sternum Technique: 3D Mode: Photon Dose Per Fraction: 3 Gy Prescribed Dose (Delivered / Prescribed): 30 Gy / 30 Gy Prescribed Fxs (Delivered / Prescribed): 10 / 10   11/05/2022 -  Anti-estrogen oral therapy   Anastrozole daily   12/16/2022 Imaging   IMPRESSION: 1. Osseous metastases T3 through T7 levels as above. Mild chronic compression deformity of T3, possibly pathologic. No extra osseous or epidural extension of tumor. 2. Mild multilevel degenerative spondylosis as above. No significant stenosis or overt neural impingement.     01/16/2023 Cancer Staging   Staging form: Breast, AJCC 8th Edition - Clinical: Stage IV (cM1) - Signed by Loa Socks, NP on 01/16/2023     ALLERGIES:  has No Known Allergies.  MEDICATIONS:  Current Outpatient Medications  Medication Sig Dispense Refill   anastrozole (ARIMIDEX) 1 MG tablet Take 1 tablet (1 mg total) by mouth daily. 90 tablet 3   diphenhydrAMINE (BENADRYL) 25 mg capsule Take 25 mg by mouth in the morning.     escitalopram (LEXAPRO) 20 MG tablet Take 20 mg by mouth daily.     meloxicam (MOBIC) 7.5 MG tablet Take 1 tablet  (7.5 mg total) by mouth daily. 30 tablet 1   montelukast (SINGULAIR) 10 MG tablet Take 10 mg by mouth at bedtime. (Patient not taking: Reported on 01/09/2023)     pantoprazole (PROTONIX) 40 MG tablet Take 40 mg by mouth at bedtime.     topiramate (TOPAMAX) 50 MG tablet Take 50 mg by mouth 2 (two) times daily.     traZODone (DESYREL) 50 MG tablet Take 50 mg by mouth at bedtime as needed for sleep. (Patient not taking: Reported on 01/09/2023)     No current facility-administered medications for this visit.    VITAL SIGNS: There were no vitals taken for this visit. There were no vitals filed for this visit.  Estimated body mass index is 32.08 kg/m as calculated from the following:   Height as of 03/16/23: 5\' 4"  (1.626 m).   Weight as of 03/16/23: 186 lb 14.4 oz (84.8 kg).  LABS: CBC:    Component Value Date/Time  WBC 5.1 03/16/2023 0822   HGB 13.0 03/16/2023 0822   HCT 39.8 03/16/2023 0822   PLT 214 03/16/2023 0822   MCV 86.5 03/16/2023 0822   NEUTROABS 3.9 03/16/2023 0822   LYMPHSABS 0.6 (L) 03/16/2023 0822   MONOABS 0.4 03/16/2023 0822   EOSABS 0.1 03/16/2023 0822   BASOSABS 0.0 03/16/2023 0822   Comprehensive Metabolic Panel:    Component Value Date/Time   NA 142 03/16/2023 0822   K 3.4 (L) 03/16/2023 0822   CL 108 03/16/2023 0822   CO2 29 03/16/2023 0822   BUN 9 03/16/2023 0822   CREATININE 0.61 03/16/2023 0822   GLUCOSE 104 (H) 03/16/2023 0822   CALCIUM 8.7 (L) 03/16/2023 0822   AST 15 03/16/2023 0822   ALT 14 03/16/2023 0822   ALKPHOS 142 (H) 03/16/2023 0822   BILITOT 0.6 03/16/2023 0822   PROT 6.5 03/16/2023 0822   ALBUMIN 3.7 03/16/2023 0822    RADIOGRAPHIC STUDIES: CT CHEST ABDOMEN PELVIS W CONTRAST  Result Date: 02/26/2023 CLINICAL DATA:  Stage IV invasive breast cancer. Assess treatment response. * Tracking Code: BO * EXAM: CT CHEST, ABDOMEN, AND PELVIS WITH CONTRAST TECHNIQUE: Multidetector CT imaging of the chest, abdomen and pelvis was performed following the  standard protocol during bolus administration of intravenous contrast. RADIATION DOSE REDUCTION: This exam was performed according to the departmental dose-optimization program which includes automated exposure control, adjustment of the mA and/or kV according to patient size and/or use of iterative reconstruction technique. CONTRAST:  OMNIPAQUE IOHEXOL 300 MG/ML  SOLN COMPARISON:  CT angiogram chest 11/18/2022.  PET-CT scan 10/20/2022 FINDINGS: CT CHEST FINDINGS Cardiovascular: Trace pericardial fluid. Heart is nonenlarged. The thoracic aorta has a normal course and caliber with some mild calcified atherosclerotic plaque Mediastinum/Nodes: Moderate-to-large hiatal hernia with patulous esophagus. Slightly heterogeneous thyroid gland with some punctate calcifications. No specific abnormal lymph node enlargement identified in the axillary regions, hilum, mediastinum, chest wall, supraclavicular. On the prior PET-CT there was a hypermetabolic left internal mammary chain lymph node. This has not clearly seen today. Only a small node in this location on series 2, image 27 measures 3 x 4 mm. Lungs/Pleura: Breathing motion. Dependent atelectasis. No consolidation, pneumothorax or effusion. No dominant lung nodule identified. Eventration of the right hemidiaphragm again identified. Musculoskeletal: Once again there are degenerative changes along the spine. Stable sclerotic bone lesion involving the thoracic spine at T5 but also subtle areas of the vertebral bodies such as T4, T3, T6, T7. The areas of sclerosis are increased from the study of May 2014 including along the posterior elements of the upper thoracic spine. There is also a presumed pathologic fracture of the upper sternum with more sclerosis today. There is lucent lesion in this location. Old left-sided posterior rib fractures are identified. Surgical clips identified along the left breast with a small fluid collection. Previously this area had measured 3.5  x 3.2 cm and today 3.9 x 2.3 cm. Likely evolving postsurgical changes. Attention on follow-up and correlate with mammography. CT ABDOMEN PELVIS FINDINGS Hepatobiliary: No focal liver abnormality is seen. Status post cholecystectomy. No biliary dilatation. Pancreas: Moderate global fatty atrophy of the pancreas. Some sparing towards the tail, unchanged from previous. Spleen: Normal in size without focal abnormality.  Stable splenule. Adrenals/Urinary Tract: Adrenal glands are preserved. Parapelvic renal cysts are identified. There is also an upper pole Bosniak 1 left-sided parenchymal cyst measuring 19 mm in diameter with Hounsfield unit of 3. No specific imaging follow-up. No separate enhancing mass or collecting system dilatation.  The ureters have normal course and caliber extending down to the bladder Stomach/Bowel: Diffuse colonic stool with scattered colonic diverticula. Normal appendix. Again there is a large hiatal hernia. The stomach and small bowel are nondilated. Vascular/Lymphatic: Aortic atherosclerosis. No enlarged abdominal or pelvic lymph nodes. There are some small less than 1 cm size retroperitoneal upper abdominal nodes, nonpathologic by size criteria. A few tiny mesenteric nodes are also identified, nonspecific. Reproductive: Uterus and bilateral adnexa are unremarkable. Other: No free air or free fluid. Small fat containing umbilical hernia. Small fat containing epigastric hernia in the midline as well. Pelvic prolapse suggested with an anterior rectocele. Please correlate with any history or symptoms. Musculoskeletal: Diffuse degenerative changes of the spine and pelvis posterior osteophytes and disc bulging seen at multiple levels in the lumbar spine with some stenosis. IMPRESSION: Progressive sclerotic bone lesions along the thoracic spine compared to the prior CT scan of May 2024. Please correlate with the MRI examination of June 2024 of the thoracic and lumbar spine. Persistent pathologic  fracture involving lesion of the upper sternum. No developing new mass lesion, fluid collection or lymph node enlargement. The small internal mammary chain lymph node which was hypermetabolic on the previous examination appears smaller today The fluid collection along the left breast adjacent to the surgical clips appears slightly smaller today could be maturation of surgical change. Large hiatal hernia. Colonic diverticulosis. Electronically Signed   By: Karen Kays M.D.   On: 02/26/2023 16:02    PERFORMANCE STATUS (ECOG) : {CHL ONC ECOG ZO:[id number]}  Review of Systems Unless otherwise noted, a complete review of systems is negative.  Physical Exam General: NAD Cardiovascular: regular rate and rhythm Pulmonary: clear ant fields Abdomen: soft, nontender, + bowel sounds Extremities: no edema, no joint deformities Skin: no rashes Neurological:  IMPRESSION: *** I introduced myself, Kordel Leavy RN, and Palliative's role in collaboration with the oncology team. Concept of Palliative Care was introduced as specialized medical care for people and their families living with serious illness.  It focuses on providing relief from the symptoms and stress of a serious illness.  The goal is to improve quality of life for both the patient and the family. Values and goals of care important to patient and family were attempted to be elicited.    We discussed *** current illness and what it means in the larger context of *** on-going co-morbidities. Natural disease trajectory and expectations were discussed.  I discussed the importance of continued conversation with family and their medical providers regarding overall plan of care and treatment options, ensuring decisions are within the context of the patients values and GOCs.  PLAN: Established therapeutic relationship. Education provided on palliative's role in collaboration with their Oncology/Radiation team. I will plan to see patient back in 2-4 weeks in  collaboration to other oncology appointments.    Patient expressed understanding and was in agreement with this plan. She also understands that She can call the clinic at any time with any questions, concerns, or complaints.   Thank you for your referral and allowing Palliative to assist in Mrs. Tracie Atkins's care.   Number and complexity of problems addressed: ***HIGH - 1 or more chronic illnesses with SEVERE exacerbation, progression, or side effects of treatment - advanced cancer, pain. Any controlled substances utilized were prescribed in the context of palliative care.   Visit consisted of counseling and education dealing with the complex and emotionally intense issues of symptom management and palliative care in the setting of serious and  potentially life-threatening illness.Greater than 50%  of this time was spent counseling and coordinating care related to the above assessment and plan.  Signed by: Willette Alma, AGPCNP-BC Palliative Medicine Team/[facility]   *Please note that this is a verbal dictation therefore any spelling or grammatical errors are due to the "[employer]" system interpretation.

## 2023-03-23 NOTE — Telephone Encounter (Signed)
Pt no-showed for 3rd palliative appt, appointment canceled and will not be rescheduled. Palliative services available as needed by re-consult from provider.

## 2023-03-24 ENCOUNTER — Other Ambulatory Visit: Payer: Self-pay | Admitting: Adult Health

## 2023-03-30 ENCOUNTER — Telehealth: Payer: Self-pay

## 2023-03-30 NOTE — Telephone Encounter (Signed)
Pt called and LVM that she needed to "speak with someone its important."  This RN called back. Pt states she needs to reschedule appt. This RN rescheduled pt with Dr. Al Pimple for Friday 09/27 at 0900. Pt agreeable to appt date/time.

## 2023-04-03 ENCOUNTER — Inpatient Hospital Stay (HOSPITAL_BASED_OUTPATIENT_CLINIC_OR_DEPARTMENT_OTHER): Payer: 59 | Admitting: Hematology and Oncology

## 2023-04-03 VITALS — BP 132/74 | HR 90 | Temp 98.1°F | Resp 16 | Ht 64.0 in | Wt 186.0 lb

## 2023-04-03 DIAGNOSIS — C50412 Malignant neoplasm of upper-outer quadrant of left female breast: Secondary | ICD-10-CM

## 2023-04-03 DIAGNOSIS — Z170 Estrogen receptor positive status [ER+]: Secondary | ICD-10-CM

## 2023-04-03 MED ORDER — ANASTROZOLE 1 MG PO TABS: 1.0000 mg | ORAL_TABLET | Freq: Every day | ORAL | 3 refills | Status: DC

## 2023-04-03 NOTE — Progress Notes (Signed)
[facility] Cancer Follow up:    Patient, No Pcp Per No address on file   DIAGNOSIS:  Cancer Staging  Malignant neoplasm of upper-outer quadrant of left breast in female, estrogen receptor positive (HCC) Staging form: Breast, AJCC 8th Edition - Clinical: Stage IIA (cT2, cN1, cM0, G2, ER+, PR+, HER2-) - Signed by Serena Croissant, MD on 12/05/2021 Stage prefix: Initial diagnosis Histologic grading system: 3 grade system - Clinical: Stage IV (cM1) - Signed by Loa Socks, NP on 01/16/2023   SUMMARY OF ONCOLOGIC HISTORY: Oncology History  Malignant neoplasm of upper-outer quadrant of left breast in female, estrogen receptor positive (HCC)  11/05/2021 Initial Diagnosis   Palpable lump in the left breast with the pain along with right-sided nipple discharge.  Mammogram reveals highly suspicious mass left breast 3 o'clock position 2.2 cm, 2 abnormal left axillary lymph nodes: Biopsy: Grade 2 IDC ER 100%, PR 0%, Ki-67 10%, HER2 2+ by IHC, FISH negative ratio 1.4, axillary lymph node biopsy: IDC, ER 100%, PR 20%, Ki-67 10%, HER2 negative ratio 1.31   11/28/2021 Breast MRI   Left breast cancer 2.7 cm and 0.7 cm linear enhancement UOQ left breast needs biopsy, 0.5 cm mass posterior to the right nipple needs biopsy, 2-lymph nodes left axilla, 1.5 cm sternal lesion   12/05/2021 Cancer Staging   Staging form: Breast, AJCC 8th Edition - Clinical: Stage IIA (cT2, cN1, cM0, G2, ER+, PR+, HER2-) - Signed by Serena Croissant, MD on 12/05/2021 Stage prefix: Initial diagnosis Histologic grading system: 3 grade system   01/15/2022 Pathology Results   SURGICAL PATHOLOGY  CASE: [id number]  PATIENT: Tracie Atkins  Surgical Pathology Report      Clinical History: left breast cancer (cm)   FINAL MICROSCOPIC DIAGNOSIS:   A. BREAST, LEFT, LUMPECTOMY:  Invasive ductal carcinoma with clear margins of resection.  Please see the synoptic report after specimen D.   B. BREAST, RIGHT,  LUMPECTOMY:  Fibrous scar with hemosiderin deposits.  Carcinoma is not identified in the resected specimen.   C. LYMPH NODE, LEFT AXILLARY #2, SENTINEL, EXCISION:  A lymph node negative for metastatic carcinoma.   D. 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   pTNM classification ( AJCC 8th Edition): pT2, pN1a  Results of prognostic markers performed on prior biopsy from 11/05/2021  ([id number])           ER: Positive in 100% of tumor cells.           PR: Negative.           Ki-67: Positive in 10% of tumor cells.           HER-2/neu: 2+ by IHC/ Negative by FISH.    02/17/2022 Oncotype testing   Oncotype of 11, no benefit from chemotherapy.   03/26/2022 - 04/11/2022 Radiation Therapy   9 fractions totaling 16.2 Gy prior to relocation to [place]   10/20/2022 PET scan   1. Multifocal hypermetabolic skeletal metastasis involving the sternum, T6 vertebral body, T5 and T7 posterior elements, and inferior RIGHT scapula. 2. Hypermetabolic LEFT internal mammary lymph node most consistent with metastatic adenopathy. 3. Postsurgical change in the RIGHT and LEFT breast. No residual carcinoma identified in the breasts or axilla by FDG PET imaging.     10/23/2022 - 11/05/2022 Radiation Therapy   Plan Name: Chest Site: Thorax including T5-7 and sternum Technique: 3D Mode: Photon Dose Per Fraction: 3 Gy Prescribed Dose (Delivered /  Prescribed): 30 Gy / 30 Gy Prescribed Fxs (Delivered / Prescribed): 10 / 10   11/05/2022 -  Anti-estrogen oral therapy   Anastrozole daily   12/16/2022 Imaging   IMPRESSION: 1. Osseous metastases T3 through T7 levels as above. Mild chronic compression deformity of T3, possibly pathologic. No extra osseous or epidural extension of tumor. 2. Mild multilevel degenerative spondylosis as above. No significant stenosis or overt neural impingement.     01/16/2023 Cancer Staging   Staging form:  Breast, AJCC 8th Edition - Clinical: Stage IV (cM1) - Signed by Loa Socks, NP on 01/16/2023     CURRENT THERAPY: Anastrozole  INTERVAL HISTORY:  Tracie Atkins 67 y.o. female returns for f/u of her metastatic breast cancer.     The patient, with a history of breast cancer here for follow up on anastrozole. She presents with an intermittent cough that 'comes and goes.' The cough is sometimes associated with the production of clear or yellow phlegm. She denies fever, dyspnea, and smoking. The patient also reports a recent decrease in alcohol consumption. She has started taking a new antidepressant and is planning to see a psychiatrist. She tells me that she is unaware of her palliative appts, no showed to all 3 appointments. Besides this, they are still waiting for a dentist visit, she is not on bisphosphonates yet because of poor dental health and no dental clearance.   Rest of the pertinent 10 point ROS reviewed and negative  Patient Active Problem List   Diagnosis Date Noted   Metastasis to bone (HCC) 01/16/2023   Vitamin D deficiency 01/16/2023   Seasonal allergies 01/16/2023   Obesity (BMI 30.0-34.9) 01/16/2023   Hyperlipidemia 01/16/2023   Essential hypertension 01/16/2023   Alcohol use disorder, severe, dependence (HCC) 11/07/2022   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   Malignant neoplasm of upper-outer quadrant of left breast in female, estrogen receptor positive (HCC) 12/05/2021   Severe episode of recurrent major depressive disorder, without psychotic features (HCC) 08/19/2021   Suicidal ideations    Alcohol abuse with alcohol-induced mood disorder (HCC) 11/20/2016    has No Known Allergies.  MEDICAL HISTORY: Past Medical History:  Diagnosis Date   Breast cancer (HCC) 2023   left   Chronic dental infection 8/189   multiple teeth removed and Post -op infection.   Depression    ETOH abuse    Fatty liver    Headache     SURGICAL HISTORY: Past Surgical  History:  Procedure Laterality Date   BREAST LUMPECTOMY WITH RADIOACTIVE SEED AND SENTINEL LYMPH NODE BIOPSY Left 01/15/2022   Procedure: LEFT BREAST SEED LOCALIZED LUMPECTOMY, LEFT SEED TARGETED AXILLARY LYMPH NODE BIOPSY, LEFT SENTINEL LYMPH NODE BIOPSY;  Surgeon: [name], MD;  Location: [facility] OR;  Service: General;  Laterality: Left;   DENTAL SURGERY     RADIOACTIVE SEED GUIDED EXCISIONAL BREAST BIOPSY Right 01/15/2022   Procedure: RIGHT BREAST SEED LOCALIZED EXCISIONAL BIOPSY;  Surgeon: [name], MD;  Location: [facility] OR;  Service: General;  Laterality: Right;   TUBAL LIGATION      SOCIAL HISTORY: Social History   Socioeconomic History   Marital status: Single    Spouse name: Not on file   Number of children: 3   Years of education: Not on file   Highest education level: Not on file  Occupational History   Not on file  Tobacco Use   Smoking status: Never    Passive exposure: Never   Smokeless tobacco: Never  Vaping Use  Vaping status: Never Used  Substance and Sexual Activity   Alcohol use: Yes    Alcohol/week: 1.0 standard drink of alcohol    Types: 1 Standard drinks or equivalent per week    Comment: 1 mixed (liquor) drink per week   Drug use: No   Sexual activity: Never  Other Topics Concern   Not on file  Social History Narrative   Not on file   Social Determinants of Health   Financial Resource Strain: Not on file  Food Insecurity: No Food Insecurity (10/16/2022)   Hunger Vital Sign    Worried About Running Out of Food in the Last Year: Never true    Ran Out of Food in the Last Year: Never true  Transportation Needs: Unmet Transportation Needs (07/30/2022)   PRAPARE - Administrator, Civil Service (Medical): Yes    Lack of Transportation (Non-Medical): No  Physical Activity: Not on file  Stress: Not on file  Social Connections: Not on file  Intimate Partner Violence: Not At Risk (10/16/2022)   Humiliation, Afraid, Rape, and Kick questionnaire     Fear of Current or Ex-Partner: No    Emotionally Abused: No    Physically Abused: No    Sexually Abused: No    FAMILY HISTORY: No family history on file.  Review of Systems  Constitutional:  Negative for appetite change, chills, fatigue, fever and unexpected weight change.  HENT:   Negative for hearing loss, lump/mass and trouble swallowing.   Eyes:  Negative for eye problems and icterus.  Respiratory:  Negative for chest tightness, cough and shortness of breath.   Cardiovascular:  Negative for chest pain, leg swelling and palpitations.  Gastrointestinal:  Negative for abdominal distention, abdominal pain, constipation, diarrhea, nausea and vomiting.  Endocrine: Negative for hot flashes.  Genitourinary:  Negative for difficulty urinating.   Musculoskeletal:  Negative for arthralgias.  Skin:  Negative for itching and rash.  Neurological:  Negative for dizziness, extremity weakness, headaches and numbness.  Hematological:  Negative for adenopathy. Does not bruise/bleed easily.  Psychiatric/Behavioral:  Negative for depression and suicidal ideas. The patient is not nervous/anxious.       PHYSICAL EXAMINATION   Vitals:   04/03/23 0911  BP: 132/74  Pulse: 90  Resp: 16  Temp: 98.1 F (36.7 C)  SpO2: 97%    Physical Exam Constitutional:      General: She is not in acute distress.    Appearance: Normal appearance. She is not toxic-appearing.  HENT:     Head: Normocephalic and atraumatic.     Mouth/Throat:     Mouth: Mucous membranes are moist.     Pharynx: Oropharynx is clear. No oropharyngeal exudate or posterior oropharyngeal erythema.  Eyes:     General: No scleral icterus. Cardiovascular:     Rate and Rhythm: Normal rate and regular rhythm.     Pulses: Normal pulses.     Heart sounds: Normal heart sounds.  Pulmonary:     Effort: Pulmonary effort is normal.     Breath sounds: Normal breath sounds.  Abdominal:     General: Abdomen is flat. Bowel sounds are  normal. There is no distension.     Palpations: Abdomen is soft.     Tenderness: There is no abdominal tenderness.  Musculoskeletal:        General: No swelling.     Cervical back: Neck supple.  Lymphadenopathy:     Cervical: No cervical adenopathy.  Skin:    General: Skin is  warm and dry.     Findings: No rash.  Neurological:     General: No focal deficit present.     Mental Status: She is alert.  Psychiatric:        Mood and Affect: Mood normal.        Behavior: Behavior normal.     LABORATORY DATA:  CBC    Component Value Date/Time   WBC 5.1 03/16/2023 0822   RBC 4.60 03/16/2023 0822   HGB 13.0 03/16/2023 0822   HCT 39.8 03/16/2023 0822   PLT 214 03/16/2023 0822   MCV 86.5 03/16/2023 0822   MCH 28.3 03/16/2023 0822   MCHC 32.7 03/16/2023 0822   RDW 14.9 03/16/2023 0822   LYMPHSABS 0.6 (L) 03/16/2023 0822   MONOABS 0.4 03/16/2023 0822   EOSABS 0.1 03/16/2023 0822   BASOSABS 0.0 03/16/2023 0822    CMP     Component Value Date/Time   NA 142 03/16/2023 0822   K 3.4 (L) 03/16/2023 0822   CL 108 03/16/2023 0822   CO2 29 03/16/2023 0822   GLUCOSE 104 (H) 03/16/2023 0822   BUN 9 03/16/2023 0822   CREATININE 0.61 03/16/2023 0822   CALCIUM 8.7 (L) 03/16/2023 0822   PROT 6.5 03/16/2023 0822   ALBUMIN 3.7 03/16/2023 0822   AST 15 03/16/2023 0822   ALT 14 03/16/2023 0822   ALKPHOS 142 (H) 03/16/2023 0822   BILITOT 0.6 03/16/2023 0822   GFRNONAA >60 03/16/2023 0822   GFRAA >60 06/12/2018 1323    ASSESSMENT and THERAPY PLAN:   Malignant neoplasm of upper-outer quadrant of left breast in female, estrogen receptor positive (HCC) Tracie Atkins is a 67 year old woman with stage IV breast cancer metastatic to the bone.  She continues on anastrozole.  Stage IV breast cancer: She continues on anastrozole daily with good tolerance.  I recommended that she continue this. Guardant360 without any targetable mutations.  Ideally I would like to add CDK 4 6 inhibition to anastrozole.   However given her underlying psychiatric issues, suicidal ideation and no regular follow ups with psychiatry, we have held on adding this.  We have reviewed the most recent scans which shows increased sclerotic lesions but this could very well be from posttreatment.  There appears to be no new disease.  Her internal mammary lymph nodes are smaller which are consistent with response hence overall clinically and radiologically, I believe she is having a response.  At this we are unable to add bisphosphonates as well since she has no dental evaluation and poor dental health Depression: She must follow-up with psychiatry on a regular basis, depression needs to be stabilized.  She is unable to do this once again because of her living situation and ongoing social stressors.  No SI mentioned today Lower pain, there appears to be no evidence of metastatic disease contributing to this pain.  She didn't talk about this pain today. 4.  Chronic Cough, Intermittent cough with phlegm production, sometimes clear, sometimes yellow. No associated fever or dyspnea. No smoking history. Monitor symptoms.      All questions were answered. The patient knows to call the clinic with any problems, questions or concerns. We can certainly see the patient much sooner if necessary.  Total encounter time:30 minutes*in face-to-face visit time, chart review, lab review, care coordination, order entry, and documentation of the encounter time.   *Total Encounter Time as defined by the Centers for Medicare and Medicaid Services includes, in addition to the face-to-face time of a patient visit (  documented in the note above) non-face-to-face time: obtaining and reviewing outside history, ordering and reviewing medications, tests or procedures, care coordination (communications with other health care professionals or caregivers) and documentation in the medical record.

## 2023-04-03 NOTE — Assessment & Plan Note (Signed)
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  She continues on anastrozole.  Stage IV breast cancer: She continues on anastrozole daily with good tolerance.  I recommended that she continue this. Guardant360 without any targetable mutations.  Ideally I would like to add CDK 4 6 inhibition to anastrozole.  However given her underlying psychiatric issues, suicidal ideation and no regular follow ups with psychiatry, we have held on adding this.  We have reviewed the most recent scans which shows increased sclerotic lesions but this could very well be from posttreatment.  There appears to be no new disease.  Her internal mammary lymph nodes are smaller which are consistent with response hence overall clinically and radiologically, I believe she is having a response.  At this we are unable to add bisphosphonates as well since she has no dental evaluation and poor dental health Depression: She must follow-up with psychiatry on a regular basis, depression needs to be stabilized.  She is unable to do this once again because of her living situation and ongoing social stressors.  No SI mentioned today Lower pain, there appears to be no evidence of metastatic disease contributing to this pain.  She didn't talk about this pain today. 4.  Chronic Cough, Intermittent cough with phlegm production, sometimes clear, sometimes yellow. No associated fever or dyspnea. No smoking history. Monitor symptoms.

## 2023-04-13 ENCOUNTER — Ambulatory Visit: Payer: 59 | Admitting: Hematology and Oncology

## 2023-04-22 ENCOUNTER — Other Ambulatory Visit: Payer: Self-pay | Admitting: Adult Health

## 2023-04-22 ENCOUNTER — Telehealth: Payer: Self-pay | Admitting: *Deleted

## 2023-04-22 ENCOUNTER — Encounter: Payer: Self-pay | Admitting: Adult Health

## 2023-04-22 DIAGNOSIS — C7951 Secondary malignant neoplasm of bone: Secondary | ICD-10-CM

## 2023-04-22 NOTE — Telephone Encounter (Signed)
S/w pt to schedule consult for KP/Osteocool. Pt states she has moved to [place]

## 2023-05-15 ENCOUNTER — Other Ambulatory Visit: Payer: Self-pay | Admitting: Family Medicine

## 2023-05-15 DIAGNOSIS — Z1211 Encounter for screening for malignant neoplasm of colon: Secondary | ICD-10-CM

## 2023-05-29 ENCOUNTER — Emergency Department (HOSPITAL_COMMUNITY): Payer: 59

## 2023-05-29 ENCOUNTER — Emergency Department (HOSPITAL_COMMUNITY)
Admission: EM | Admit: 2023-05-29 | Discharge: 2023-05-30 | Disposition: A | Discharge status: Home / Self Care | Payer: 59 | Attending: Emergency Medicine | Admitting: Emergency Medicine

## 2023-05-29 ENCOUNTER — Other Ambulatory Visit: Payer: Self-pay

## 2023-05-29 ENCOUNTER — Encounter (HOSPITAL_COMMUNITY): Payer: Self-pay

## 2023-05-29 DIAGNOSIS — R059 Cough, unspecified: Secondary | ICD-10-CM | POA: Diagnosis not present

## 2023-05-29 DIAGNOSIS — R519 Headache, unspecified: Secondary | ICD-10-CM | POA: Diagnosis present

## 2023-05-29 DIAGNOSIS — R350 Frequency of micturition: Secondary | ICD-10-CM | POA: Diagnosis not present

## 2023-05-29 DIAGNOSIS — Z20822 Contact with and (suspected) exposure to covid-19: Secondary | ICD-10-CM | POA: Diagnosis not present

## 2023-05-29 DIAGNOSIS — R110 Nausea: Secondary | ICD-10-CM

## 2023-05-29 DIAGNOSIS — Z853 Personal history of malignant neoplasm of breast: Secondary | ICD-10-CM | POA: Diagnosis not present

## 2023-05-29 DIAGNOSIS — G44219 Episodic tension-type headache, not intractable: Secondary | ICD-10-CM | POA: Insufficient documentation

## 2023-05-29 DIAGNOSIS — N390 Urinary tract infection, site not specified: Secondary | ICD-10-CM | POA: Diagnosis not present

## 2023-05-29 DIAGNOSIS — R0789 Other chest pain: Secondary | ICD-10-CM | POA: Insufficient documentation

## 2023-05-29 LAB — BASIC METABOLIC PANEL
Anion gap: 9 (ref 5–15)
BUN: 17 mg/dL (ref 8–23)
CO2: 19 mmol/L — ABNORMAL LOW (ref 22–32)
Calcium: 9 mg/dL (ref 8.9–10.3)
Chloride: 105 mmol/L (ref 98–111)
Creatinine, Ser: 0.78 mg/dL (ref 0.44–1.00)
GFR, Estimated: >60 mL/min (ref >60)
Glucose, Bld: 131 mg/dL — ABNORMAL HIGH (ref 70–99)
Potassium: 4 mmol/L (ref 3.5–5.1)
Sodium: 133 mmol/L — ABNORMAL LOW (ref 135–145)

## 2023-05-29 LAB — CBC WITH DIFFERENTIAL/PLATELET
Abs Immature Granulocytes: 0.02 10*3/uL (ref 0.00–0.07)
Basophils Absolute: 0 10*3/uL (ref 0.0–0.1)
Basophils Relative: 0 %
Eosinophils Absolute: 0 10*3/uL (ref 0.0–0.5)
Eosinophils Relative: 0 %
HCT: 42 % (ref 36.0–46.0)
Hemoglobin: 13.3 g/dL (ref 12.0–15.0)
Immature Granulocytes: 0 %
Lymphocytes Relative: 3 %
Lymphs Abs: 0.3 10*3/uL — ABNORMAL LOW (ref 0.7–4.0)
MCH: 27.7 pg (ref 26.0–34.0)
MCHC: 31.7 g/dL (ref 30.0–36.0)
MCV: 87.3 fL (ref 80.0–100.0)
Monocytes Absolute: 0.4 10*3/uL (ref 0.1–1.0)
Monocytes Relative: 5 %
Neutro Abs: 8 10*3/uL — ABNORMAL HIGH (ref 1.7–7.7)
Neutrophils Relative %: 92 %
Platelets: 219 10*3/uL (ref 150–400)
RBC: 4.81 MIL/uL (ref 3.87–5.11)
RDW: 14.6 % (ref 11.5–15.5)
WBC: 8.7 10*3/uL (ref 4.0–10.5)
nRBC: 0 % (ref 0.0–0.2)

## 2023-05-29 LAB — URINALYSIS, W/ REFLEX TO CULTURE (INFECTION SUSPECTED)
Bilirubin Urine: NEGATIVE
Glucose, UA: NEGATIVE mg/dL
Hgb urine dipstick: NEGATIVE
Ketones, ur: NEGATIVE mg/dL
Nitrite: NEGATIVE
Protein, ur: NEGATIVE mg/dL
Specific Gravity, Urine: 1.014 (ref 1.005–1.030)
pH: 6 (ref 5.0–8.0)

## 2023-05-29 LAB — SARS CORONAVIRUS 2 BY RT PCR: SARS Coronavirus 2 by RT PCR: NEGATIVE

## 2023-05-29 LAB — I-STAT CG4 LACTIC ACID, ED
Lactic Acid, Venous: 2 mmol/L (ref 0.5–1.9)
Lactic Acid, Venous: 0.9 mmol/L (ref 0.5–1.9)

## 2023-05-29 MED ORDER — LACTATED RINGERS IV SOLN: INTRAVENOUS | Status: AC

## 2023-05-29 MED ORDER — ACETAMINOPHEN 500 MG PO TABS
1000.0000 mg | ORAL_TABLET | Freq: Once | ORAL | Status: DC
  Filled 2023-05-29: qty 2

## 2023-05-29 MED ORDER — MORPHINE SULFATE (PF) 4 MG/ML IV SOLN
4.0000 mg | Freq: Once | INTRAVENOUS | Status: AC
  Administered 2023-05-29: 4 mg via INTRAVENOUS
  Filled 2023-05-29: qty 1

## 2023-05-29 MED ORDER — KETOROLAC TROMETHAMINE 30 MG/ML IJ SOLN
15.0000 mg | Freq: Once | INTRAMUSCULAR | Status: AC
  Administered 2023-05-29: 15 mg via INTRAVENOUS
  Filled 2023-05-29: qty 1

## 2023-05-29 MED ORDER — ONDANSETRON HCL 4 MG/2ML IJ SOLN
4.0000 mg | Freq: Once | INTRAMUSCULAR | Status: AC
  Administered 2023-05-29: 4 mg via INTRAVENOUS
  Filled 2023-05-29: qty 2

## 2023-05-29 MED ORDER — IOHEXOL 350 MG/ML SOLN
75.0000 mL | Freq: Once | INTRAVENOUS | Status: AC | PRN
  Administered 2023-05-29: 75 mL via INTRAVENOUS

## 2023-05-29 MED ORDER — LACTATED RINGERS IV BOLUS
1000.0000 mL | Freq: Once | INTRAVENOUS | Status: AC
Start: 2023-05-29 — End: 2023-05-29
  Administered 2023-05-29: 1000 mL via INTRAVENOUS

## 2023-05-29 NOTE — ED Triage Notes (Signed)
C/o headache, chills, nausea, and upper back pain x2 days.

## 2023-05-29 NOTE — ED Provider Notes (Signed)
[place] EMERGENCY DEPARTMENT AT [facility] Provider Note   CSN: [id number] Arrival date & time: 05/29/23  1746     History  Chief Complaint  Patient presents with   Headache    Tracie Atkins is a 67 y.o. female.  67 year old female presents for acute onset of myalgias along with just not feeling well.  States she has had a cough.  She also endorses urinary symptoms.  Headache without severe neck pain or photophobia.  No documented fever at home.  No abdominal discomfort.  Has had some chest discomfort only when she coughs.  Has a history of metastatic breast cancer.  Not on chemo currently       Home Medications Prior to Admission medications   Medication Sig Start Date End Date Taking? Authorizing Provider  anastrozole (ARIMIDEX) 1 MG tablet Take 1 tablet (1 mg total) by mouth daily. 04/03/23   Rachel Moulds, MD  diphenhydrAMINE (BENADRYL) 25 mg capsule Take 25 mg by mouth in the morning.    [provider]  escitalopram (LEXAPRO) 20 MG tablet Take 20 mg by mouth daily. 12/29/22   [provider]  meloxicam (MOBIC) 7.5 MG tablet Take 1 tablet (7.5 mg total) by mouth daily. 01/16/23   Causey, Larna Daughters, NP  montelukast (SINGULAIR) 10 MG tablet Take 10 mg by mouth at bedtime. 12/29/22   [provider]  pantoprazole (PROTONIX) 40 MG tablet Take 40 mg by mouth at bedtime. 09/17/22   [provider]  topiramate (TOPAMAX) 50 MG tablet Take 50 mg by mouth 2 (two) times daily.    [provider]  traZODone (DESYREL) 50 MG tablet Take 50 mg by mouth at bedtime as needed for sleep. 12/29/22   [provider]      Allergies    Patient has no known allergies.    Review of Systems   Review of Systems  All other systems reviewed and are negative.   Physical Exam Updated Vital Signs BP (!) 138/113   Pulse (!) 119   Temp 99.5 F (37.5 C) (Oral)   Resp 20   Wt 84 kg   SpO2 93%   BMI 31.79 kg/m  Physical  Exam Vitals and nursing note reviewed.  Constitutional:      General: She is not in acute distress.    Appearance: Normal appearance. She is well-developed. She is not toxic-appearing.  HENT:     Head: Normocephalic and atraumatic.  Eyes:     General: Lids are normal.     Conjunctiva/sclera: Conjunctivae normal.     Pupils: Pupils are equal, round, and reactive to light.  Neck:     Thyroid: No thyroid mass.     Trachea: No tracheal deviation.  Cardiovascular:     Rate and Rhythm: Regular rhythm. Tachycardia present.     Heart sounds: Normal heart sounds. No murmur heard.    No gallop.  Pulmonary:     Effort: Pulmonary effort is normal. No respiratory distress.     Breath sounds: Normal breath sounds. No stridor. No decreased breath sounds, wheezing, rhonchi or rales.  Abdominal:     General: There is no distension.     Palpations: Abdomen is soft.     Tenderness: There is no abdominal tenderness. There is no rebound.  Musculoskeletal:        General: No tenderness. Normal range of motion.     Cervical back: Normal range of motion and neck supple.  Skin:  General: Skin is warm and dry.     Findings: No abrasion or rash.  Neurological:     Mental Status: She is alert and oriented to person, place, and time. Mental status is at baseline.     GCS: GCS eye subscore is 4. GCS verbal subscore is 5. GCS motor subscore is 6.     Cranial Nerves: Cranial nerves are intact. No cranial nerve deficit.     Sensory: No sensory deficit.     Motor: Motor function is intact.  Psychiatric:        Attention and Perception: Attention normal.        Speech: Speech normal.        Behavior: Behavior normal.     ED Results / Procedures / Treatments   Labs (all labs ordered are listed, but only abnormal results are displayed) Labs Reviewed  CBC WITH DIFFERENTIAL/PLATELET - Abnormal; Notable for the following components:      Result Value   Neutro Abs 8.0 (*)    Lymphs Abs 0.3 (*)    All  other components within normal limits  BASIC METABOLIC PANEL - Abnormal; Notable for the following components:   Sodium 133 (*)    CO2 19 (*)    Glucose, Bld 131 (*)    All other components within normal limits  I-STAT CG4 LACTIC ACID, ED - Abnormal; Notable for the following components:   Lactic Acid, Venous 2.0 (*)    All other components within normal limits  SARS CORONAVIRUS 2 BY RT PCR  CULTURE, BLOOD (ROUTINE X 2)  CULTURE, BLOOD (ROUTINE X 2)  RESP PANEL BY RT-PCR (RSV, FLU A&B, COVID)  [id number]  URINALYSIS, W/ REFLEX TO CULTURE (INFECTION SUSPECTED)  I-STAT CG4 LACTIC ACID, ED    EKG EKG Interpretation Date/Time:  Friday May 29 2023 18:12:31 EST Ventricular Rate:  126 PR Interval:  127 QRS Duration:  72 QT Interval:  316 QTC Calculation: 458 R Axis:   46  Text Interpretation: Sinus tachycardia Ventricular premature complex Minimal ST depression, lateral leads Confirmed by Lorre Nick ([id number]) on 05/29/2023 8:53:12 PM  Radiology No results found.  Procedures Procedures    Medications Ordered in ED Medications  lactated ringers bolus 1,000 mL (has no administration in time range)  lactated ringers infusion (has no administration in time range)    ED Course/ Medical Decision Making/ A&P                                 Medical Decision Making Amount and/or Complexity of Data Reviewed Radiology: ordered.  Risk Prescription drug management.   Patient is EKG shows sinus tachycardia.  Chest x-ray without acute findings here.  Analysis negative.  Patient has febrile illness.  COVID test negative.  First lactate elevated 2 was given fluids and responded well to this.  Repeat lactate 0.9.  Patient still tachycardic.  CTA chest ordered and signed out to Dr. Eudelia Bunch        Final Clinical Impression(s) / ED Diagnoses Final diagnoses:  None    Rx / DC Orders ED Discharge Orders     None         Lorre Nick, MD 05/29/23 2325

## 2023-05-29 NOTE — ED Provider Triage Note (Signed)
Emergency Medicine Provider Triage Evaluation Note  Tracie Atkins , a 67 y.o. female  was evaluated in triage.  Pt complains of headache.  Located to posterior head and into her neck.  Began 3 days ago.  Has had chills, is not documented fever.  She has some lightheadedness, no dizziness.  No congestion, rhinorrhea, chest pain, shortness of breath.  No recent falls or injuries.  She does note she has history of metastatic cancer  Review of Systems  Positive: HA, fever Negative:   Physical Exam  BP 120/87 (BP Location: Left Arm)   Pulse (!) 125   Temp 99.5 F (37.5 C) (Oral)   Resp 20   Wt 84 kg   SpO2 96%   BMI 31.79 kg/m  Gen:   Awake, no distress   Resp:  Normal effort  MSK:   Moves extremities without difficulty  Other:  Equal hand grip bil, No obvious facial droop  Medical Decision Making  Medically screening exam initiated at 6:09 PM.  Appropriate orders placed.  BREEZE BISSETT was informed that the remainder of the evaluation will be completed by another provider, this initial triage assessment does not replace that evaluation, and the importance of remaining in the ED until their evaluation is complete.  HA, fever  Patient tachycardic into the 130s, nursing aware patient needs room and back     Brockton Mckesson A, PA-C 05/29/23 1820

## 2023-05-30 MED ORDER — NITROFURANTOIN MONOHYD MACRO 100 MG PO CAPS
100.0000 mg | ORAL_CAPSULE | Freq: Once | ORAL | Status: AC
Start: 2023-05-30 — End: 2023-05-30
  Administered 2023-05-30: 100 mg via ORAL
  Filled 2023-05-30: qty 1

## 2023-05-30 MED ORDER — NITROFURANTOIN MONOHYD MACRO 100 MG PO CAPS: 100.0000 mg | ORAL_CAPSULE | Freq: Two times a day (BID) | ORAL | 0 refills | Status: DC

## 2023-05-30 MED ORDER — ONDANSETRON 4 MG PO TBDP: 4.0000 mg | ORAL_TABLET | Freq: Three times a day (TID) | ORAL | 0 refills | Status: AC | PRN

## 2023-05-30 NOTE — ED Provider Notes (Signed)
I assumed care of this patient from previous provider.  Please see their note for further details of history, exam, and MDM.   Briefly patient is a 67 y.o. female who presented with headache, myalgias, nausea and shortness of breath.  Patient also having urinary symptoms.  Initial workup notable for possible urinary tract infection.  Chest x-ray was normal.  Currently pending a CTA to rule out PE or occult pneumonia.    CTA negative for PE, dissection or pneumonia. Patient's tachycardia resolved now in the 90s. Headache resolved.  Given first dose of Macrobid in the emergency department.  The patient appears reasonably screened and/or stabilized for discharge and I doubt any other medical condition or other [facility] requiring further screening, evaluation, or treatment in the ED at this time. I have discussed the findings, Dx and Tx plan with the patient/family who expressed understanding and agree(s) with the plan. Discharge instructions discussed at length. The patient/family was given strict return precautions who verbalized understanding of the instructions. No further questions at time of discharge.  Disposition: Discharge  Condition: Good  ED Discharge Orders          Ordered    nitrofurantoin, macrocrystal-monohydrate, (MACROBID) 100 MG capsule  2 times daily        05/30/23 0120    ondansetron (ZOFRAN-ODT) 4 MG disintegrating tablet  Every 8 hours PRN        05/30/23 0120             Follow Up: Primary care provider  Go to  as scheduled        Primus Gritton, Amadeo Garnet, MD 05/30/23 0127

## 2023-06-02 ENCOUNTER — Telehealth: Payer: Self-pay | Admitting: *Deleted

## 2023-06-02 ENCOUNTER — Other Ambulatory Visit: Payer: Self-pay | Admitting: *Deleted

## 2023-06-03 LAB — CULTURE, BLOOD (ROUTINE X 2)
Culture: NO GROWTH
Culture: NO GROWTH
Special Requests: ADEQUATE

## 2023-06-03 NOTE — Telephone Encounter (Signed)
This RN returned VM left by pt - stating " please call me " with no additional information- obtained pt's VM- message left to return call.

## 2023-06-18 ENCOUNTER — Ambulatory Visit
Admission: RE | Admit: 2023-06-18 | Discharge: 2023-06-18 | Disposition: A | Discharge status: Home / Self Care | Payer: 59 | Source: Ambulatory Visit | Attending: Family Medicine | Admitting: Family Medicine

## 2023-06-18 DIAGNOSIS — Z1211 Encounter for screening for malignant neoplasm of colon: Secondary | ICD-10-CM

## 2023-06-22 ENCOUNTER — Other Ambulatory Visit: Payer: Self-pay | Admitting: *Deleted

## 2023-06-22 MED ORDER — ANASTROZOLE 1 MG PO TABS: 1.0000 mg | ORAL_TABLET | Freq: Every day | ORAL | 3 refills | Status: DC

## 2023-06-22 NOTE — Telephone Encounter (Signed)
Pt left VM stating " I am about out of my cancer pills, please call me"  This RN returned call- obtained identified VM - message left requesting pt to call this RN and if she obtains the VM to leave the name of the medication for refill.

## 2023-07-27 ENCOUNTER — Inpatient Hospital Stay: Payer: 59

## 2023-07-27 ENCOUNTER — Telehealth: Payer: Self-pay | Admitting: Pharmacy Technician

## 2023-07-27 ENCOUNTER — Telehealth: Payer: Self-pay | Admitting: Hematology and Oncology

## 2023-07-27 ENCOUNTER — Inpatient Hospital Stay: Payer: 59 | Attending: Internal Medicine | Admitting: Hematology and Oncology

## 2023-07-27 ENCOUNTER — Other Ambulatory Visit (HOSPITAL_COMMUNITY): Payer: Self-pay

## 2023-07-27 ENCOUNTER — Encounter: Payer: Self-pay | Admitting: Hematology and Oncology

## 2023-07-27 VITALS — BP 144/68 | HR 99 | Temp 98.4°F | Resp 18 | Wt 197.3 lb

## 2023-07-27 DIAGNOSIS — Z79899 Other long term (current) drug therapy: Secondary | ICD-10-CM | POA: Diagnosis not present

## 2023-07-27 DIAGNOSIS — Z79811 Long term (current) use of aromatase inhibitors: Secondary | ICD-10-CM | POA: Insufficient documentation

## 2023-07-27 DIAGNOSIS — Z1722 Progesterone receptor negative status: Secondary | ICD-10-CM | POA: Diagnosis not present

## 2023-07-27 DIAGNOSIS — F32A Depression, unspecified: Secondary | ICD-10-CM | POA: Diagnosis not present

## 2023-07-27 DIAGNOSIS — C50412 Malignant neoplasm of upper-outer quadrant of left female breast: Secondary | ICD-10-CM

## 2023-07-27 DIAGNOSIS — Z1732 Human epidermal growth factor receptor 2 negative status: Secondary | ICD-10-CM | POA: Insufficient documentation

## 2023-07-27 DIAGNOSIS — Z170 Estrogen receptor positive status [ER+]: Secondary | ICD-10-CM | POA: Insufficient documentation

## 2023-07-27 DIAGNOSIS — C7951 Secondary malignant neoplasm of bone: Secondary | ICD-10-CM | POA: Diagnosis not present

## 2023-07-27 LAB — CBC WITH DIFFERENTIAL/PLATELET
Abs Immature Granulocytes: 0.02 10*3/uL (ref 0.00–0.07)
Basophils Absolute: 0 10*3/uL (ref 0.0–0.1)
Basophils Relative: 1 %
Eosinophils Absolute: 0.1 10*3/uL (ref 0.0–0.5)
Eosinophils Relative: 1 %
HCT: 39 % (ref 36.0–46.0)
Hemoglobin: 12.3 g/dL (ref 12.0–15.0)
Immature Granulocytes: 0 %
Lymphocytes Relative: 12 %
Lymphs Abs: 0.7 10*3/uL (ref 0.7–4.0)
MCH: 26.6 pg (ref 26.0–34.0)
MCHC: 31.5 g/dL (ref 30.0–36.0)
MCV: 84.4 fL (ref 80.0–100.0)
Monocytes Absolute: 0.5 10*3/uL (ref 0.1–1.0)
Monocytes Relative: 8 %
Neutro Abs: 4.7 10*3/uL (ref 1.7–7.7)
Neutrophils Relative %: 78 %
Platelets: 207 10*3/uL (ref 150–400)
RBC: 4.62 MIL/uL (ref 3.87–5.11)
RDW: 14.9 % (ref 11.5–15.5)
WBC: 6 10*3/uL (ref 4.0–10.5)
nRBC: 0 % (ref 0.0–0.2)

## 2023-07-27 LAB — CMP (CANCER CENTER ONLY)
ALT: 12 U/L (ref 0–44)
AST: 15 U/L (ref 15–41)
Albumin: 3.8 g/dL (ref 3.5–5.0)
Alkaline Phosphatase: 140 U/L — ABNORMAL HIGH (ref 38–126)
Anion gap: 6 (ref 5–15)
BUN: 14 mg/dL (ref 8–23)
CO2: 26 mmol/L (ref 22–32)
Calcium: 9.1 mg/dL (ref 8.9–10.3)
Chloride: 106 mmol/L (ref 98–111)
Creatinine: 0.68 mg/dL (ref 0.44–1.00)
GFR, Estimated: >60 mL/min (ref >60)
Glucose, Bld: 106 mg/dL — ABNORMAL HIGH (ref 70–99)
Potassium: 3.7 mmol/L (ref 3.5–5.1)
Sodium: 138 mmol/L (ref 135–145)
Total Bilirubin: 0.3 mg/dL (ref 0.0–1.2)
Total Protein: 6.6 g/dL (ref 6.5–8.1)

## 2023-07-27 MED ORDER — PALBOCICLIB 75 MG PO TABS
75.0000 mg | ORAL_TABLET | Freq: Every day | ORAL | 1 refills | Status: DC
  Filled 2023-07-31: qty 21, 28d supply, fill #0

## 2023-07-27 NOTE — Telephone Encounter (Signed)
Spoke with patient confirming upcoming appointment  

## 2023-07-27 NOTE — Progress Notes (Signed)
[facility] Cancer Follow up:    Patient, No Pcp Per No address on file   DIAGNOSIS:  Cancer Staging  Malignant neoplasm of upper-outer quadrant of left breast in female, estrogen receptor positive (HCC) Staging form: Breast, AJCC 8th Edition - Clinical: Stage IIA (cT2, cN1, cM0, G2, ER+, PR+, HER2-) - Signed by Serena Croissant, MD on 12/05/2021 Stage prefix: Initial diagnosis Histologic grading system: 3 grade system - Clinical: Stage IV (cM1) - Signed by Loa Socks, NP on 01/16/2023   SUMMARY OF ONCOLOGIC HISTORY: Oncology History  Malignant neoplasm of upper-outer quadrant of left breast in female, estrogen receptor positive (HCC)  11/05/2021 Initial Diagnosis   Palpable lump in the left breast with the pain along with right-sided nipple discharge.  Mammogram reveals highly suspicious mass left breast 3 o'clock position 2.2 cm, 2 abnormal left axillary lymph nodes: Biopsy: Grade 2 IDC ER 100%, PR 0%, Ki-67 10%, HER2 2+ by IHC, FISH negative ratio 1.4, axillary lymph node biopsy: IDC, ER 100%, PR 20%, Ki-67 10%, HER2 negative ratio 1.31   11/28/2021 Breast MRI   Left breast cancer 2.7 cm and 0.7 cm linear enhancement UOQ left breast needs biopsy, 0.5 cm mass posterior to the right nipple needs biopsy, 2-lymph nodes left axilla, 1.5 cm sternal lesion   12/05/2021 Cancer Staging   Staging form: Breast, AJCC 8th Edition - Clinical: Stage IIA (cT2, cN1, cM0, G2, ER+, PR+, HER2-) - Signed by Serena Croissant, MD on 12/05/2021 Stage prefix: Initial diagnosis Histologic grading system: 3 grade system   01/15/2022 Pathology Results   SURGICAL PATHOLOGY  CASE: [id number]  PATIENT: Tracie Atkins  Surgical Pathology Report      Clinical History: left breast cancer (cm)   FINAL MICROSCOPIC DIAGNOSIS:   A. BREAST, LEFT, LUMPECTOMY:  Invasive ductal carcinoma with clear margins of resection.  Please see the synoptic report after specimen D.   B. BREAST, RIGHT,  LUMPECTOMY:  Fibrous scar with hemosiderin deposits.  Carcinoma is not identified in the resected specimen.   C. LYMPH NODE, LEFT AXILLARY #2, SENTINEL, EXCISION:  A lymph node negative for metastatic carcinoma.   D. 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   pTNM classification ( AJCC 8th Edition): pT2, pN1a  Results of prognostic markers performed on prior biopsy from 11/05/2021  ([id number])           ER: Positive in 100% of tumor cells.           PR: Negative.           Ki-67: Positive in 10% of tumor cells.           HER-2/neu: 2+ by IHC/ Negative by FISH.    02/17/2022 Oncotype testing   Oncotype of 11, no benefit from chemotherapy.   03/26/2022 - 04/11/2022 Radiation Therapy   9 fractions totaling 16.2 Gy prior to relocation to [place]   10/20/2022 PET scan   1. Multifocal hypermetabolic skeletal metastasis involving the sternum, T6 vertebral body, T5 and T7 posterior elements, and inferior RIGHT scapula. 2. Hypermetabolic LEFT internal mammary lymph node most consistent with metastatic adenopathy. 3. Postsurgical change in the RIGHT and LEFT breast. No residual carcinoma identified in the breasts or axilla by FDG PET imaging.     10/23/2022 - 11/05/2022 Radiation Therapy   Plan Name: Chest Site: Thorax including T5-7 and sternum Technique: 3D Mode: Photon Dose Per Fraction: 3 Gy Prescribed Dose (Delivered /  Prescribed): 30 Gy / 30 Gy Prescribed Fxs (Delivered / Prescribed): 10 / 10   11/05/2022 -  Anti-estrogen oral therapy   Anastrozole daily   12/16/2022 Imaging   IMPRESSION: 1. Osseous metastases T3 through T7 levels as above. Mild chronic compression deformity of T3, possibly pathologic. No extra osseous or epidural extension of tumor. 2. Mild multilevel degenerative spondylosis as above. No significant stenosis or overt neural impingement.     01/16/2023 Cancer Staging   Staging form:  Breast, AJCC 8th Edition - Clinical: Stage IV (cM1) - Signed by Loa Socks, NP on 01/16/2023     CURRENT THERAPY: Anastrozole  INTERVAL HISTORY:  Tracie Atkins 68 y.o. female returns for f/u of her metastatic breast cancer.     The patient, with a history of breast cancer here for follow up on anastrozole.   The patient, with a history of metastatic breast cancer and mental health issues, reports no significant changes in health since the last visit. She is compliant with anastrozole for breast cancer and Lexapro and trazodone for mental health. The patient denies alcohol use and reports progress in securing dental care. She also mentions ongoing sinus issues, which have been present for approximately two months. The patient is scheduled for a dentist appointment and expresses concerns about her living situation, indicating a desire for a stable, personal space.  Rest of the pertinent 10 point ROS reviewed and negative  Patient Active Problem List   Diagnosis Date Noted   Metastasis to bone (HCC) 01/16/2023   Vitamin D deficiency 01/16/2023   Seasonal allergies 01/16/2023   Obesity (BMI 30.0-34.9) 01/16/2023   Hyperlipidemia 01/16/2023   Essential hypertension 01/16/2023   Alcohol use disorder, severe, dependence (HCC) 11/07/2022   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   Malignant neoplasm of upper-outer quadrant of left breast in female, estrogen receptor positive (HCC) 12/05/2021   Severe episode of recurrent major depressive disorder, without psychotic features (HCC) 08/19/2021   Suicidal ideations    Alcohol abuse with alcohol-induced mood disorder (HCC) 11/20/2016    has no known allergies.  MEDICAL HISTORY: Past Medical History:  Diagnosis Date   Breast cancer (HCC) 2023   left   Chronic dental infection 8/189   multiple teeth removed and Post -op infection.   Depression    ETOH abuse    Fatty liver    Headache     SURGICAL HISTORY: Past Surgical  History:  Procedure Laterality Date   BREAST LUMPECTOMY WITH RADIOACTIVE SEED AND SENTINEL LYMPH NODE BIOPSY Left 01/15/2022   Procedure: LEFT BREAST SEED LOCALIZED LUMPECTOMY, LEFT SEED TARGETED AXILLARY LYMPH NODE BIOPSY, LEFT SENTINEL LYMPH NODE BIOPSY;  Surgeon: [name], MD;  Location: [facility] OR;  Service: General;  Laterality: Left;   DENTAL SURGERY     RADIOACTIVE SEED GUIDED EXCISIONAL BREAST BIOPSY Right 01/15/2022   Procedure: RIGHT BREAST SEED LOCALIZED EXCISIONAL BIOPSY;  Surgeon: [name], MD;  Location: [facility] OR;  Service: General;  Laterality: Right;   TUBAL LIGATION      SOCIAL HISTORY: Social History   Socioeconomic History   Marital status: Single    Spouse name: Not on file   Number of children: 3   Years of education: Not on file   Highest education level: Not on file  Occupational History   Not on file  Tobacco Use   Smoking status: Never    Passive exposure: Never   Smokeless tobacco: Never  Vaping Use   Vaping status: Never Used  Substance and  Sexual Activity   Alcohol use: Yes    Alcohol/week: 1.0 standard drink of alcohol    Types: 1 Standard drinks or equivalent per week    Comment: 1 mixed (liquor) drink per week   Drug use: No   Sexual activity: Never  Other Topics Concern   Not on file  Social History Narrative   Not on file   Social Drivers of Health   Financial Resource Strain: Not on file  Food Insecurity: Low Risk  (06/30/2023)   Received from [facility]   Hunger Vital Sign    Worried About Running Out of Food in the Last Year: Never true    Ran Out of Food in the Last Year: Never true  Transportation Needs: No Transportation Needs (06/30/2023)   Received from [facility]    In the past 12 months, has lack of reliable transportation kept you from medical appointments, meetings, work or from getting things needed for daily living? : No  Physical Activity: Not on file  Stress: Not on file  Social Connections:  Not on file  Intimate Partner Violence: Not At Risk (10/16/2022)   Humiliation, Afraid, Rape, and Kick questionnaire    Fear of Current or Ex-Partner: No    Emotionally Abused: No    Physically Abused: No    Sexually Abused: No    FAMILY HISTORY: No family history on file.  Review of Systems  Constitutional:  Negative for appetite change, chills, fatigue, fever and unexpected weight change.  HENT:   Negative for hearing loss, lump/mass and trouble swallowing.   Eyes:  Negative for eye problems and icterus.  Respiratory:  Negative for chest tightness, cough and shortness of breath.   Cardiovascular:  Negative for chest pain, leg swelling and palpitations.  Gastrointestinal:  Negative for abdominal distention, abdominal pain, constipation, diarrhea, nausea and vomiting.  Endocrine: Negative for hot flashes.  Genitourinary:  Negative for difficulty urinating.   Musculoskeletal:  Negative for arthralgias.  Skin:  Negative for itching and rash.  Neurological:  Negative for dizziness, extremity weakness, headaches and numbness.  Hematological:  Negative for adenopathy. Does not bruise/bleed easily.  Psychiatric/Behavioral:  Negative for depression and suicidal ideas. The patient is not nervous/anxious.       PHYSICAL EXAMINATION   Vitals:   07/27/23 0924  BP: (!) 144/68  Pulse: 99  Resp: 18  Temp: 98.4 F (36.9 C)  SpO2: 97%    Physical Exam Constitutional:      General: She is not in acute distress.    Appearance: Normal appearance. She is not toxic-appearing.  HENT:     Head: Normocephalic and atraumatic.     Mouth/Throat:     Mouth: Mucous membranes are moist.     Pharynx: Oropharynx is clear. No oropharyngeal exudate or posterior oropharyngeal erythema.  Eyes:     General: No scleral icterus. Cardiovascular:     Rate and Rhythm: Normal rate and regular rhythm.     Pulses: Normal pulses.     Heart sounds: Normal heart sounds.  Pulmonary:     Effort: Pulmonary  effort is normal.     Breath sounds: Normal breath sounds.  Abdominal:     General: Abdomen is flat. Bowel sounds are normal. There is no distension.     Palpations: Abdomen is soft.     Tenderness: There is no abdominal tenderness.  Musculoskeletal:        General: No swelling.     Cervical back: Neck supple.  Lymphadenopathy:     Cervical: No cervical adenopathy.  Skin:    General: Skin is warm and dry.     Findings: No rash.  Neurological:     General: No focal deficit present.     Mental Status: She is alert.  Psychiatric:        Mood and Affect: Mood normal.        Behavior: Behavior normal.     LABORATORY DATA:  CBC    Component Value Date/Time   WBC 8.7 05/29/2023 1821   RBC 4.81 05/29/2023 1821   HGB 13.3 05/29/2023 1821   HCT 42.0 05/29/2023 1821   PLT 219 05/29/2023 1821   MCV 87.3 05/29/2023 1821   MCH 27.7 05/29/2023 1821   MCHC 31.7 05/29/2023 1821   RDW 14.6 05/29/2023 1821   LYMPHSABS 0.3 (L) 05/29/2023 1821   MONOABS 0.4 05/29/2023 1821   EOSABS 0.0 05/29/2023 1821   BASOSABS 0.0 05/29/2023 1821    CMP     Component Value Date/Time   NA 133 (L) 05/29/2023 1821   K 4.0 05/29/2023 1821   CL 105 05/29/2023 1821   CO2 19 (L) 05/29/2023 1821   GLUCOSE 131 (H) 05/29/2023 1821   BUN 17 05/29/2023 1821   CREATININE 0.78 05/29/2023 1821   CALCIUM 9.0 05/29/2023 1821   PROT 6.5 03/16/2023 0822   ALBUMIN 3.7 03/16/2023 0822   AST 15 03/16/2023 0822   ALT 14 03/16/2023 0822   ALKPHOS 142 (H) 03/16/2023 0822   BILITOT 0.6 03/16/2023 0822   GFRNONAA >60 05/29/2023 1821   GFRAA >60 06/12/2018 1323    ASSESSMENT and THERAPY PLAN:   Malignant neoplasm of upper-outer quadrant of left breast in female, estrogen receptor positive (HCC) Sofhia is a 68 year old woman with stage IV breast cancer metastatic to the bone.  She continues on anastrozole.  Metastatic Breast Cancer Stable on Anastrozole. Discussed adding Ibrance to the treatment regimen given  the patient's improved overall health status. Ilda Foil can cause temporary drop in blood counts, but this is expected and managed with a week off the medication every month. -Start Ibrance 75mg  daily for 21 days, then 7 days off. We can increase the dose if she tolerates it well and remains compliant. -Continue Anastrozole daily. -Order CT scans to assess disease progression. -Check blood work today.  Depression Stable on Lexapro and Trazodone. Not taking Topamax. -Continue Lexapro and Trazodone.  Alcohol Use Reports abstinence. -Encourage continued abstinence.  Dental Health Has a dental appointment scheduled. -Provide dental clearance form for dentist to sign and fax back. -Once dental clearance received, plan to start bone strengthener  Follow-up Given the addition of Ibrance, monthly follow-ups are necessary. -Schedule follow-up in 4-5 weeks with labs.       All questions were answered. The patient knows to call the clinic with any problems, questions or concerns. We can certainly see the patient much sooner if necessary.  Total encounter time:30 minutes*in face-to-face visit time, chart review, lab review, care coordination, order entry, and documentation of the encounter time.   *Total Encounter Time as defined by the Centers for Medicare and Medicaid Services includes, in addition to the face-to-face time of a patient visit (documented in the note above) non-face-to-face time: obtaining and reviewing outside history, ordering and reviewing medications, tests or procedures, care coordination (communications with other health care professionals or caregivers) and documentation in the medical record.

## 2023-07-27 NOTE — Assessment & Plan Note (Signed)
Tracie Atkins is a 68 year old woman with stage IV breast cancer metastatic to the bone.  She continues on anastrozole.  Metastatic Breast Cancer Stable on Anastrozole. Discussed adding Ibrance to the treatment regimen given the patient's improved overall health status. Ilda Foil can cause temporary drop in blood counts, but this is expected and managed with a week off the medication every month. -Start Ibrance 75mg  daily for 21 days, then 7 days off. We can increase the dose if she tolerates it well and remains compliant. -Continue Anastrozole daily. -Order CT scans to assess disease progression. -Check blood work today.  Depression Stable on Lexapro and Trazodone. Not taking Topamax. -Continue Lexapro and Trazodone.  Alcohol Use Reports abstinence. -Encourage continued abstinence.  Dental Health Has a dental appointment scheduled. -Provide dental clearance form for dentist to sign and fax back. -Once dental clearance received, plan to start bone strengthener  Follow-up Given the addition of Ibrance, monthly follow-ups are necessary. -Schedule follow-up in 4-5 weeks with labs.

## 2023-07-27 NOTE — Telephone Encounter (Signed)
Oral Oncology Patient Advocate Encounter  After completing a benefits investigation, prior authorization for Ilda Foil is not required at this time through [facility].  Patient's copay is $0.     Jinger Neighbors, CPhT-Adv Oncology Pharmacy Patient Advocate [facility] Direct Number: [phone]  Fax: [phone]

## 2023-07-28 ENCOUNTER — Telehealth: Payer: Self-pay | Admitting: *Deleted

## 2023-07-28 LAB — CANCER ANTIGEN 27.29: CA 27.29: 23.2 U/mL (ref 0.0–38.6)

## 2023-07-28 LAB — CANCER ANTIGEN 15-3: CA 15-3: 17.5 U/mL (ref 0.0–25.0)

## 2023-07-28 NOTE — Telephone Encounter (Signed)
Pt called to this RN late in day  (1614) stating she went to the dentist and does have an infection " but they want Dr Al Pimple to order the antibiotic because they said they do not know what interferes with the Ibrance".  She states dental office is [facility] with phone number of [phone].  This note will be forwarded to MD for review and further recommendation.

## 2023-07-29 ENCOUNTER — Telehealth: Payer: Self-pay | Admitting: Hematology and Oncology

## 2023-07-29 NOTE — Telephone Encounter (Signed)
Per Dr Al Pimple TC to Pt to inform her that Dr Al Pimple will not order antibiotic. Per Dr Al Pimple informed Pt that she can be prescribed any antibiotic and can return call to dentist to prescribe the antibiotic. Pt verbalized understanding.

## 2023-07-30 ENCOUNTER — Other Ambulatory Visit: Payer: Self-pay

## 2023-07-30 ENCOUNTER — Emergency Department (HOSPITAL_COMMUNITY)
Admission: EM | Admit: 2023-07-30 | Discharge: 2023-07-30 | Disposition: A | Discharge status: Home / Self Care | Payer: 59 | Attending: Emergency Medicine | Admitting: Emergency Medicine

## 2023-07-30 ENCOUNTER — Encounter (HOSPITAL_COMMUNITY): Payer: Self-pay | Admitting: *Deleted

## 2023-07-30 ENCOUNTER — Other Ambulatory Visit: Payer: Self-pay | Admitting: Pharmacy Technician

## 2023-07-30 ENCOUNTER — Telehealth: Payer: Self-pay | Admitting: *Deleted

## 2023-07-30 DIAGNOSIS — K047 Periapical abscess without sinus: Secondary | ICD-10-CM | POA: Insufficient documentation

## 2023-07-30 DIAGNOSIS — B370 Candidal stomatitis: Secondary | ICD-10-CM | POA: Diagnosis not present

## 2023-07-30 DIAGNOSIS — K0889 Other specified disorders of teeth and supporting structures: Secondary | ICD-10-CM | POA: Diagnosis present

## 2023-07-30 MED ORDER — AMOXICILLIN-POT CLAVULANATE 875-125 MG PO TABS: 1.0000 | ORAL_TABLET | Freq: Two times a day (BID) | ORAL | 0 refills | Status: DC

## 2023-07-30 MED ORDER — CLOTRIMAZOLE 10 MG MT TROC: 10.0000 mg | Freq: Every day | OROMUCOSAL | 0 refills | Status: AC

## 2023-07-30 NOTE — Telephone Encounter (Signed)
VM left by patient at 940 this am stating she contacted the dental office per need for antibiotic-"but they need a release from your office stating it is ok "  This RN opened chart to follow up and noted pt has proceeded to the ER and obtained Augmentin for 7 days.  Informed MD of above.

## 2023-07-30 NOTE — ED Provider Notes (Signed)
[place] EMERGENCY DEPARTMENT AT [facility] Provider Note   CSN: [id number] Arrival date & time: 07/30/23  9147     History  Chief Complaint  Patient presents with   Dental Pain    Tracie Atkins is a 68 y.o. female.  Tracie Atkins is a 68 y.o. female who presents to the ED for evaluation of dental pain.  She has had pain along the front 3 lower teeth.  This has been ongoing for the past 3 weeks and she has had some drainage from the area.  She saw dentist yesterday who did diagnose her with dental infection but did not prescribe her antibiotics and recommended that she talk with her cancer doctor to make sure antibiotics would not interfere with her cancer medications.  Patient is on anastrozole and Ibrance.  She also mentions that she has had a white coating to the tongue.  She denies any fevers or chills.  No difficulty swallowing.  No facial swelling.  No other aggravating or alleviating factors.  The history is provided by the patient.  Dental Pain Associated symptoms: no facial swelling and no fever        Home Medications Prior to Admission medications   Medication Sig Start Date End Date Taking? Authorizing Provider  amoxicillin-clavulanate (AUGMENTIN) 875-125 MG tablet Take 1 tablet by mouth every 12 (twelve) hours. 07/30/23  Yes Rosezella Rumpf, PA-C  clotrimazole (MYCELEX) 10 MG troche Take 1 tablet (10 mg total) by mouth 5 (five) times daily for 10 days. 07/30/23 08/09/23 Yes Rosezella Rumpf, PA-C  anastrozole (ARIMIDEX) 1 MG tablet Take 1 tablet (1 mg total) by mouth daily. 06/22/23   Rachel Moulds, MD  diphenhydrAMINE (BENADRYL) 25 mg capsule Take 25 mg by mouth in the morning.    [provider]  escitalopram (LEXAPRO) 20 MG tablet Take 20 mg by mouth daily. 12/29/22   [provider]  meloxicam (MOBIC) 7.5 MG tablet Take 1 tablet (7.5 mg total) by mouth daily. 01/16/23   Causey, Larna Daughters, NP  montelukast (SINGULAIR) 10 MG  tablet Take 10 mg by mouth at bedtime. 12/29/22   [provider]  nitrofurantoin, macrocrystal-monohydrate, (MACROBID) 100 MG capsule Take 1 capsule (100 mg total) by mouth 2 (two) times daily. 05/30/23   Cardama, Amadeo Garnet, MD  palbociclib [facility]) 75 MG tablet Take 1 tablet (75 mg total) by mouth daily. Take for 21 days on, 7 days off, repeat every 28 days. 07/27/23   Rachel Moulds, MD  pantoprazole (PROTONIX) 40 MG tablet Take 40 mg by mouth at bedtime. 09/17/22   [provider]  topiramate (TOPAMAX) 50 MG tablet Take 50 mg by mouth 2 (two) times daily.    [provider]  traZODone (DESYREL) 50 MG tablet Take 50 mg by mouth at bedtime as needed for sleep. 12/29/22   [provider]      Allergies    Patient has no known allergies.    Review of Systems   Review of Systems  Constitutional:  Negative for chills and fever.  HENT:  Positive for dental problem. Negative for facial swelling and trouble swallowing.     Physical Exam Updated Vital Signs BP 129/82 (BP Location: Left Arm)   Pulse 96   Temp 98.9 F (37.2 C) (Oral)   Resp 20   Wt 89.4 kg   SpO2 97%   BMI 33.81 kg/m  Physical Exam Vitals and nursing note reviewed.  Constitutional:  General: She is not in acute distress.    Appearance: Normal appearance. She is well-developed. She is not ill-appearing or diaphoretic.  HENT:     Head: Normocephalic and atraumatic.     Mouth/Throat:     Comments: Teeth generally in poor dentition 3 front lower teeth with some decay at the gumline with receding erythematous gums noted, no purulent drainage or obvious visible abscess.  No sublingual tenderness or induration.  Posterior oropharynx clear, normal phonation, tolerating secretions.  No facial swelling or bony tenderness over the mandible.  White coating noted over the tongue, does not extend to the posterior oropharynx Eyes:     General:        Right eye: No discharge.        Left eye:  No discharge.  Pulmonary:     Effort: Pulmonary effort is normal. No respiratory distress.  Neurological:     Mental Status: She is alert and oriented to person, place, and time.     Coordination: Coordination normal.  Psychiatric:        Mood and Affect: Mood normal.        Behavior: Behavior normal.     ED Results / Procedures / Treatments   Labs (all labs ordered are listed, but only abnormal results are displayed) Labs Reviewed - No data to display  EKG None  Radiology No results found.  Procedures Procedures    Medications Ordered in ED Medications - No data to display  ED Course/ Medical Decision Making/ A&P                                 Medical Decision Making Risk Prescription drug management.   68 year old female presents with dental infection, no obvious abscess and no concern for Ludwig's angina.  Patient also with white coating on the tongue consistent with thrush.  Will treat dental infection with Augmentin, no interactions noted with chemo medications.  Will also treat with Mycelex troches for thrush.  Patient to follow-up with dentist        Final Clinical Impression(s) / ED Diagnoses Final diagnoses:  Dental infection  Thrush    Rx / DC Orders ED Discharge Orders          Ordered    amoxicillin-clavulanate (AUGMENTIN) 875-125 MG tablet  Every 12 hours        07/30/23 1033    clotrimazole (MYCELEX) 10 MG troche  5 times daily        07/30/23 1033              Velda Shell 07/31/23 2134    Cathren Laine, MD 08/01/23 1351

## 2023-07-30 NOTE — ED Triage Notes (Signed)
Here by POV from home for R mandibular dental infection/ pain (3 teeth). Pain ongoing for 3 weeks. Mentions drainage. Developed NV last night, V x3. Denies fever. No ABT given. Was instructed to check with CA MD to make sure antibiotics didn't interfere with CA meds. Meds include Anastrazole. Mentions white tongue. Rates pain 10/10. Alert, NAD, calm, interactive.

## 2023-07-30 NOTE — Discharge Instructions (Signed)
Take prescribed antibiotics to help with dental infection continue to follow-up with your dentist.  White coating on your tongue is due to thrush this is a buildup of yeast, likely due to some of the medications you are on for your cancer treatment.  Use prescribed lozenges 5 times daily.  Touch base with your oncologist regarding current infection.

## 2023-07-31 ENCOUNTER — Other Ambulatory Visit: Payer: Self-pay

## 2023-07-31 ENCOUNTER — Other Ambulatory Visit (HOSPITAL_COMMUNITY): Payer: Self-pay

## 2023-07-31 ENCOUNTER — Encounter: Payer: Self-pay | Admitting: *Deleted

## 2023-07-31 NOTE — Progress Notes (Unsigned)
Specialty Pharmacy Initial Fill Coordination Note  Tracie Atkins is a 67 y.o. female contacted today regarding refills of specialty medication(s) Palbociclib [name] .  Patient requested [name] at [facility] at [place]  on 08/03/23   Medication will be filled on 07/31/23.   Patient is aware of $0 copayment.

## 2023-08-03 ENCOUNTER — Inpatient Hospital Stay: Payer: 59 | Admitting: Pharmacist

## 2023-08-03 ENCOUNTER — Other Ambulatory Visit: Payer: Self-pay

## 2023-08-03 VITALS — BP 126/75 | HR 101 | Temp 98.0°F | Resp 18 | Ht 64.0 in | Wt 199.0 lb

## 2023-08-03 DIAGNOSIS — C50412 Malignant neoplasm of upper-outer quadrant of left female breast: Secondary | ICD-10-CM | POA: Diagnosis not present

## 2023-08-03 DIAGNOSIS — Z170 Estrogen receptor positive status [ER+]: Secondary | ICD-10-CM

## 2023-08-03 MED ORDER — ONDANSETRON HCL 8 MG PO TABS: 8.0000 mg | ORAL_TABLET | Freq: Three times a day (TID) | ORAL | 2 refills | Status: AC | PRN

## 2023-08-03 NOTE — Progress Notes (Signed)
[facility]       Telephone: [phone]?Fax: [phone]   Oncology Clinical Pharmacist Practitioner Initial Assessment  Tracie Atkins is a 68 y.o. female with a diagnosis of breast cancer. They were contacted today via in-person visit. She is accompanied by her son Tracie Atkins.  Indication/Regimen Palbociclib [name] is being used appropriately for treatment of metastatic breast cancer by Dr. Rachel Moulds.      Wt Readings from Last 1 Encounters:  08/03/23 199 lb (90.3 kg)    Estimated body surface area is 2.02 meters squared as calculated from the following:   Height as of this encounter: 5\' 4"  (1.626 m).   Weight as of this encounter: 199 lb (90.3 kg).  The dosing regimen is 75 mg by mouth daily on days 1 to 21 of a 28-day cycle. This is being given  in combination with anastrozole . It is planned to continue until disease progression or unacceptable toxicity. Prescription dose and frequency assessed for appropriateness. Dr. Al Pimple is planning on starting a bone strengthener once patient has received dental clearance. She did have a recent dental infection and is currently on amoxicillin and clavulanate (Augmentin).   Patient has agreed to treatment which is documented in physician note on 07/27/23. Counseled patient on administration, dosing, side effects, monitoring, drug-food interactions, safe handling, storage, and disposal.  She will start palbociclib once she has finished amoxicillin and clavulanate, tentatively she will finish 08/05/23 and start palbociclib on 08/06/23.  Dose Modifications Dr. Al Pimple is starting Tracie Atkins at palbociclib 75 mg by mouth daily 21 out of 28 days   Access Assessment CHISOM MUNTEAN will be receiving palbociclib through [facility] Concerns: none Start date if known: TBD  Adherence Assessment Reviewed importance on keeping a med schedule and plan for any missed doses Barriers to adherence  identified? No  Allergies No Known Allergies  Vitals    08/03/2023    1:21 PM 07/30/2023    9:17 AM 07/30/2023    9:02 AM  Oncology Vitals  Height 163 cm    Weight 90.266 kg 89.359 kg   Weight (lbs) 199 lbs 197 lbs   BMI 34.16 kg/m2 33.81 kg/m2   Temp 98 F (36.7 C)  98.9 F (37.2 C)  Pulse Rate 101 96 103  BP 126/75  129/82  Resp 18  20  SpO2 99 %  97 %  BSA (m2) 2.02 m2 2.01 m2      Laboratory Data    Latest Ref Rng & Units 07/27/2023   11:03 AM 05/29/2023    6:21 PM 03/16/2023    8:22 AM  CBC EXTENDED  WBC 4.0 - 10.5 K/uL 6.0  8.7  5.1   RBC 3.87 - 5.11 MIL/uL 4.62  4.81  4.60   Hemoglobin 12.0 - 15.0 g/dL 29.5  62.1  30.8   HCT 36.0 - 46.0 % 39.0  42.0  39.8   Platelets 150 - 400 K/uL 207  219  214   NEUT# 1.7 - 7.7 K/uL 4.7  8.0  3.9   Lymph# 0.7 - 4.0 K/uL 0.7  0.3  0.6        Latest Ref Rng & Units 07/27/2023   11:03 AM 05/29/2023    6:21 PM 03/16/2023    8:22 AM  CMP  Glucose 70 - 99 mg/dL 657  846  962   BUN 8 - 23 mg/dL 14  17  9    Creatinine 0.44 - 1.00 mg/dL  0.68  0.78  0.61   Sodium 135 - 145 mmol/L 138  133  142   Potassium 3.5 - 5.1 mmol/L 3.7  4.0  3.4   Chloride 98 - 111 mmol/L 106  105  108   CO2 22 - 32 mmol/L 26  19  29    Calcium 8.9 - 10.3 mg/dL 9.1  9.0  8.7   Total Protein 6.5 - 8.1 g/dL 6.6   6.5   Total Bilirubin 0.0 - 1.2 mg/dL 0.3   0.6   Alkaline Phos 38 - 126 U/L 140   142   AST 15 - 41 U/L 15   15   ALT 0 - 44 U/L 12   14    No results found for: "MG" Lab Results  Component Value Date   CA2729 23.2 07/27/2023   CA2729 14.4 01/16/2023     Contraindications Contraindications were reviewed? Yes Contraindications to therapy were identified? No   Safety Precautions The following safety precautions for the use of palbociclib were reviewed:  Fever: reviewed the importance of having a thermometer and the Centers for Disease Control and Prevention (CDC) definition of fever which is 100.50F (38C) or higher. Patient should call 24/7  triage at [phone] if experiencing a fever or any other symptoms Decreased white blood cells (WBCs) and increased risk for infection Decreased hemoglobin, part of the red blood cells that carry iron and oxygen Decreased platelet count and increased risk for bleeding Changes in liver function Fatigue Nausea or vomiting Hair loss (alopecia) Mouth irritation or sores Pneumonitis / ILD Handling body fluids and wastes Storage and handling Missed doses Avoid grapefruit products  Medication Reconciliation Current Outpatient Medications  Medication Sig Dispense Refill   amoxicillin-clavulanate (AUGMENTIN) 875-125 MG tablet Take 1 tablet by mouth every 12 (twelve) hours. 14 tablet 0   anastrozole (ARIMIDEX) 1 MG tablet Take 1 tablet (1 mg total) by mouth daily. 90 tablet 3   escitalopram (LEXAPRO) 20 MG tablet Take 20 mg by mouth daily.     meloxicam (MOBIC) 7.5 MG tablet Take 1 tablet (7.5 mg total) by mouth daily. 30 tablet 1   montelukast (SINGULAIR) 10 MG tablet Take 10 mg by mouth at bedtime.     ondansetron (ZOFRAN) 8 MG tablet Take 1 tablet (8 mg total) by mouth every 8 (eight) hours as needed for nausea or vomiting. 30 tablet 2   pantoprazole (PROTONIX) 40 MG tablet Take 40 mg by mouth at bedtime.     clotrimazole (MYCELEX) 10 MG troche Take 1 tablet (10 mg total) by mouth 5 (five) times daily for 10 days. (Patient not taking: Reported on 08/03/2023) 50 tablet 0   palbociclib (IBRANCE) 75 MG tablet Take 1 tablet (75 mg total) by mouth daily. Take for 21 days on, 7 days off, repeat every 28 days. (Patient not taking: Reported on 08/03/2023) 21 tablet 1   No current facility-administered medications for this visit.   Medication reconciliation is based on the patient's most recent medication list in the electronic medical record (EMR) including herbal products and OTC medications.   The patient's medication list was reviewed today with the patient? Yes   Drug-drug interactions  (DDIs) DDIs were evaluated? Yes Significant DDIs identified? No   Drug-Food Interactions Drug-food interactions were evaluated? Yes Drug-food interactions identified? Grapefruit products  Follow-up Plan  Patient education handout given to patient Start palbociclib 75 mg by mouth daily 21 out of 28 days once finished with amoxicillin and clavulanate, tentative start 08/06/23  Continue anastrozole 1 mg by mouth daily (started 11/07/22) Will start bone strengthener for bone mets once cleared by dentist. She needs 3 teeth extracted, then start ~3 months after surgery Ondansetron prescription sent PRN for nausea She will use loperamide for loose stool Tracie Atkins will contact [facility] to inquire about clotrimazole that was sent for thrush by ED physicians but not filled.  amoxicillin and clavulanate last dose will be Wednesday (7 day supply).  See Dr. Al Pimple with labs in 3 weeks after starting palbociclib. Mondays and Fridays work best when her son can come with her to appt Follow up with clinical pharmacy as deemed necessary by Dr. Rada Hay participated in the discussion, expressed understanding, and voiced agreement with the above plan. All questions were answered to her satisfaction. The patient was advised to contact the clinic at [phone] with any questions or concerns prior to her return visit.   I spent 60 minutes assessing the patient.  Khylee Algeo A. Odetta Pink, PharmD, BCOP, CPP  Anselm Lis, RPH-CPP, 08/03/2023 1:56 PM  **Disclaimer: This note was dictated with voice recognition software. Similar sounding words can inadvertently be transcribed and this note may contain transcription errors which may not have been corrected upon publication of note.**

## 2023-08-03 NOTE — Progress Notes (Signed)
Patient counseled in clinic visit note on 08/03/23

## 2023-08-05 ENCOUNTER — Inpatient Hospital Stay: Payer: 59 | Admitting: Pharmacist

## 2023-08-05 DIAGNOSIS — Z170 Estrogen receptor positive status [ER+]: Secondary | ICD-10-CM

## 2023-08-05 NOTE — Progress Notes (Signed)
[facility]       Telephone: [phone]?Fax: [phone]   Oncology Clinical Pharmacist Practitioner Progress Note  Tracie Atkins is a 68 y.o. female with a diagnosis of metastatic breast cancer who is planning on starting palbociclib under the care of Dr. Rachel Moulds. She continues on anastrozole.  I connected with Tracie Atkins today by telephone and verified that I was speaking with the correct person using two patient identifiers. I discussed the limitations, risks, security and privacy concerns of performing an evaluation and management service by telemedicine and the availability of in-person appointments. The patient/caregiver expressed understanding and agreed to proceed.  Other persons participating in the visit and their role in the encounter: none   Patient's location: home  Provider's location: clinic  Tracie Atkins contacted Dr. Remonia Richter clinic today and was contacted back by clinical pharmacy. Tracie Atkins reports still having a dental infection. She was advised not to start palbociclib until the infection clears as palbociclib may lower her body's ability to fight infection. She has been on amoxicillin and clavulanate. She has a follow up appointment with Dr. Al Pimple and labs on 08/24/23.  Tracie Atkins participated in the discussion, expressed understanding, and voiced agreement with the above plan. All questions were answered to her satisfaction. The patient was advised to contact the clinic at [phone] with any questions or concerns prior to her return visit.  Clinical pharmacy will continue to support Tracie Atkins and Dr. Rachel Moulds as needed.  Tracie Atkins A. Odetta Pink, PharmD, BCOP, CPP  Anselm Lis, RPH-CPP,  08/05/2023  10:34 AM   **Disclaimer: This note was dictated with voice recognition software. Similar sounding words can inadvertently be transcribed and this note may contain transcription errors which may not have been corrected upon  publication of note.**

## 2023-08-06 ENCOUNTER — Other Ambulatory Visit (HOSPITAL_COMMUNITY): Payer: Self-pay

## 2023-08-10 ENCOUNTER — Ambulatory Visit (HOSPITAL_COMMUNITY)
Admission: RE | Admit: 2023-08-10 | Discharge: 2023-08-10 | Disposition: A | Discharge status: Home / Self Care | Payer: 59 | Source: Ambulatory Visit | Attending: Hematology and Oncology | Admitting: Hematology and Oncology

## 2023-08-10 DIAGNOSIS — C7951 Secondary malignant neoplasm of bone: Secondary | ICD-10-CM | POA: Insufficient documentation

## 2023-08-10 DIAGNOSIS — Z170 Estrogen receptor positive status [ER+]: Secondary | ICD-10-CM | POA: Insufficient documentation

## 2023-08-10 DIAGNOSIS — C50412 Malignant neoplasm of upper-outer quadrant of left female breast: Secondary | ICD-10-CM | POA: Insufficient documentation

## 2023-08-10 MED ORDER — IOHEXOL 300 MG/ML  SOLN
100.0000 mL | Freq: Once | INTRAMUSCULAR | Status: AC | PRN
  Administered 2023-08-10: 100 mL via INTRAVENOUS

## 2023-08-18 ENCOUNTER — Other Ambulatory Visit (HOSPITAL_COMMUNITY): Payer: Self-pay

## 2023-08-24 ENCOUNTER — Inpatient Hospital Stay: Payer: 59 | Admitting: Hematology and Oncology

## 2023-08-24 ENCOUNTER — Inpatient Hospital Stay: Payer: 59

## 2023-08-24 ENCOUNTER — Telehealth: Payer: Self-pay | Admitting: *Deleted

## 2023-08-24 NOTE — Telephone Encounter (Signed)
Pt left VM requesting a return call with given phone number.  This RN attempted to return call and obtained identified VM- message left.  Note pt had appt scheduled for today now showing as canceled.

## 2023-08-26 ENCOUNTER — Other Ambulatory Visit: Payer: Self-pay

## 2023-08-26 NOTE — Progress Notes (Signed)
Per patient, she has not been able to start therapy with Ibrance due to infections in lower teeth necessitating removal. She is still in search of a dentist to address this. Advised patient to contact us once she is set to begin treatment. Claire aware patient enrollment will be paused until this time.

## 2023-08-28 ENCOUNTER — Emergency Department (HOSPITAL_COMMUNITY): Payer: 59

## 2023-08-28 ENCOUNTER — Emergency Department (HOSPITAL_COMMUNITY)
Admission: EM | Admit: 2023-08-28 | Discharge: 2023-08-28 | Disposition: A | Discharge status: Home / Self Care | Payer: 59 | Attending: Emergency Medicine | Admitting: Emergency Medicine

## 2023-08-28 ENCOUNTER — Other Ambulatory Visit: Payer: Self-pay

## 2023-08-28 DIAGNOSIS — R109 Unspecified abdominal pain: Secondary | ICD-10-CM | POA: Diagnosis not present

## 2023-08-28 DIAGNOSIS — J069 Acute upper respiratory infection, unspecified: Secondary | ICD-10-CM | POA: Insufficient documentation

## 2023-08-28 DIAGNOSIS — R42 Dizziness and giddiness: Secondary | ICD-10-CM | POA: Diagnosis not present

## 2023-08-28 DIAGNOSIS — R059 Cough, unspecified: Secondary | ICD-10-CM | POA: Diagnosis present

## 2023-08-28 DIAGNOSIS — I10 Essential (primary) hypertension: Secondary | ICD-10-CM | POA: Insufficient documentation

## 2023-08-28 DIAGNOSIS — Z853 Personal history of malignant neoplasm of breast: Secondary | ICD-10-CM | POA: Diagnosis not present

## 2023-08-28 LAB — CBG MONITORING, ED: Glucose-Capillary: 97 mg/dL (ref 70–99)

## 2023-08-28 LAB — CBC WITH DIFFERENTIAL/PLATELET
Abs Immature Granulocytes: 0.01 10*3/uL (ref 0.00–0.07)
Basophils Absolute: 0 10*3/uL (ref 0.0–0.1)
Basophils Relative: 1 %
Eosinophils Absolute: 0.1 10*3/uL (ref 0.0–0.5)
Eosinophils Relative: 1 %
HCT: 35.5 % — ABNORMAL LOW (ref 36.0–46.0)
Hemoglobin: 10.9 g/dL — ABNORMAL LOW (ref 12.0–15.0)
Immature Granulocytes: 0 %
Lymphocytes Relative: 13 %
Lymphs Abs: 0.7 10*3/uL (ref 0.7–4.0)
MCH: 26.7 pg (ref 26.0–34.0)
MCHC: 30.7 g/dL (ref 30.0–36.0)
MCV: 86.8 fL (ref 80.0–100.0)
Monocytes Absolute: 0.6 10*3/uL (ref 0.1–1.0)
Monocytes Relative: 12 %
Neutro Abs: 3.8 10*3/uL (ref 1.7–7.7)
Neutrophils Relative %: 73 %
Platelets: 232 10*3/uL (ref 150–400)
RBC: 4.09 MIL/uL (ref 3.87–5.11)
RDW: 15.6 % — ABNORMAL HIGH (ref 11.5–15.5)
WBC: 5.2 10*3/uL (ref 4.0–10.5)
nRBC: 0 % (ref 0.0–0.2)

## 2023-08-28 LAB — URINALYSIS, ROUTINE W REFLEX MICROSCOPIC
Bilirubin Urine: NEGATIVE
Glucose, UA: NEGATIVE mg/dL
Hgb urine dipstick: NEGATIVE
Ketones, ur: NEGATIVE mg/dL
Leukocytes,Ua: NEGATIVE
Nitrite: NEGATIVE
Protein, ur: NEGATIVE mg/dL
Specific Gravity, Urine: 1.006 (ref 1.005–1.030)
pH: 5 (ref 5.0–8.0)

## 2023-08-28 LAB — COMPREHENSIVE METABOLIC PANEL
ALT: 15 U/L (ref 0–44)
AST: 16 U/L (ref 15–41)
Albumin: 3.3 g/dL — ABNORMAL LOW (ref 3.5–5.0)
Alkaline Phosphatase: 101 U/L (ref 38–126)
Anion gap: 9 (ref 5–15)
BUN: 16 mg/dL (ref 8–23)
CO2: 22 mmol/L (ref 22–32)
Calcium: 8.6 mg/dL — ABNORMAL LOW (ref 8.9–10.3)
Chloride: 106 mmol/L (ref 98–111)
Creatinine, Ser: 0.59 mg/dL (ref 0.44–1.00)
GFR, Estimated: >60 mL/min (ref >60)
Glucose, Bld: 88 mg/dL (ref 70–99)
Potassium: 3.9 mmol/L (ref 3.5–5.1)
Sodium: 137 mmol/L (ref 135–145)
Total Bilirubin: 0.5 mg/dL (ref 0.0–1.2)
Total Protein: 6.2 g/dL — ABNORMAL LOW (ref 6.5–8.1)

## 2023-08-28 LAB — RESP PANEL BY RT-PCR (RSV, FLU A&B, COVID)  RVPGX2
Influenza A by PCR: NEGATIVE
Influenza B by PCR: NEGATIVE
Resp Syncytial Virus by PCR: NEGATIVE
SARS Coronavirus 2 by RT PCR: NEGATIVE

## 2023-08-28 LAB — LIPASE, BLOOD: Lipase: 30 U/L (ref 11–51)

## 2023-08-28 MED ORDER — IOHEXOL 300 MG/ML  SOLN
100.0000 mL | Freq: Once | INTRAMUSCULAR | Status: AC | PRN
  Administered 2023-08-28: 100 mL via INTRAVENOUS

## 2023-08-28 NOTE — ED Provider Notes (Signed)
 [place] EMERGENCY DEPARTMENT AT [facility] Provider Note   CSN: [id number] Arrival date & time: 08/28/23  1150     History  Chief Complaint  Patient presents with   Fatigue   Cough   Dizziness    Tracie Atkins is a 68 y.o. female with PMHx metastatic breast cancer, depression, ETOH abuse, headaches, HLD, HTN who presents to ED concerned for fatigue, dizziness, productive cough x1 week. Patient also stating that she thinks that she is dehydrated as she has not been drinking much water recently - patient drinking water in ED without problem. Endorses subjective fever last night. Endorses intermittent central abdominal pain x2 days that does not radiate. Denies chest pain, nausea, vomiting, diarrhea, dysuria, hematuria, hematochezia. Not on radiation or chemo for her metastatic cancer.   Last BM today.   Cough Dizziness      Home Medications Prior to Admission medications   Medication Sig Start Date End Date Taking? Authorizing Provider  amoxicillin-clavulanate (AUGMENTIN) 875-125 MG tablet Take 1 tablet by mouth every 12 (twelve) hours. 07/30/23   Rosezella Rumpf, PA-C  anastrozole (ARIMIDEX) 1 MG tablet Take 1 tablet (1 mg total) by mouth daily. 06/22/23   Rachel Moulds, MD  escitalopram (LEXAPRO) 20 MG tablet Take 20 mg by mouth daily. 12/29/22   [provider]  meloxicam (MOBIC) 7.5 MG tablet Take 1 tablet (7.5 mg total) by mouth daily. 01/16/23   Causey, Larna Daughters, NP  montelukast (SINGULAIR) 10 MG tablet Take 10 mg by mouth at bedtime. 12/29/22   [provider]  ondansetron (ZOFRAN) 8 MG tablet Take 1 tablet (8 mg total) by mouth every 8 (eight) hours as needed for nausea or vomiting. 08/03/23   Rachel Moulds, MD  palbociclib (IBRANCE) 75 MG tablet Take 1 tablet (75 mg total) by mouth daily. Take for 21 days on, 7 days off, repeat every 28 days. Patient not taking: Reported on 08/03/2023 07/27/23   Rachel Moulds, MD  pantoprazole  (PROTONIX) 40 MG tablet Take 40 mg by mouth at bedtime. 09/17/22   [provider]      Allergies    Patient has no known allergies.    Review of Systems   Review of Systems  Respiratory:  Positive for cough.   Neurological:  Positive for dizziness.    Physical Exam Updated Vital Signs BP 130/80   Pulse 93   Temp 97.8 F (36.6 C) (Oral)   Resp 18   Ht 5\' 4"  (1.626 m)   Wt 86.2 kg   SpO2 99%   BMI 32.61 kg/m  Physical Exam Vitals and nursing note reviewed.  Constitutional:      General: She is not in acute distress.    Appearance: She is not ill-appearing or toxic-appearing.  HENT:     Head: Normocephalic and atraumatic.     Mouth/Throat:     Mouth: Mucous membranes are moist.  Eyes:     General: No scleral icterus.       Right eye: No discharge.        Left eye: No discharge.     Conjunctiva/sclera: Conjunctivae normal.  Cardiovascular:     Rate and Rhythm: Normal rate and regular rhythm.     Pulses: Normal pulses.     Heart sounds: Normal heart sounds. No murmur heard. Pulmonary:     Effort: Pulmonary effort is normal. No respiratory distress.     Breath sounds: Normal breath sounds. No wheezing, rhonchi or rales.  Abdominal:  General: Abdomen is flat. Bowel sounds are normal. There is no distension.     Palpations: Abdomen is soft. There is no mass.     Tenderness: There is abdominal tenderness.     Comments: Mild tenderness to palpation just superior to umbilicus  Musculoskeletal:     Right lower leg: No edema.     Left lower leg: No edema.  Skin:    General: Skin is warm and dry.     Findings: No rash.  Neurological:     General: No focal deficit present.     Mental Status: She is alert and oriented to person, place, and time. Mental status is at baseline.  Psychiatric:        Mood and Affect: Mood normal.     ED Results / Procedures / Treatments   Labs (all labs ordered are listed, but only abnormal results are displayed) Labs  Reviewed  COMPREHENSIVE METABOLIC PANEL - Abnormal; Notable for the following components:      Result Value   Calcium 8.6 (*)    Total Protein 6.2 (*)    Albumin 3.3 (*)    All other components within normal limits  CBC WITH DIFFERENTIAL/PLATELET - Abnormal; Notable for the following components:   Hemoglobin 10.9 (*)    HCT 35.5 (*)    RDW 15.6 (*)    All other components within normal limits  URINALYSIS, ROUTINE W REFLEX MICROSCOPIC - Abnormal; Notable for the following components:   Color, Urine STRAW (*)    All other components within normal limits  RESP PANEL BY RT-PCR (RSV, FLU A&B, COVID)  RVPGX2  LIPASE, BLOOD  CBG MONITORING, ED  CBG MONITORING, ED    EKG EKG Interpretation Date/Time:  Friday August 28 2023 12:03:06 EST Ventricular Rate:  84 PR Interval:  123 QRS Duration:  78 QT Interval:  375 QTC Calculation: 444 R Axis:   44  Text Interpretation: Sinus rhythm Abnormal R-wave progression, early transition No significant change since last tracing Confirmed by Gwyneth Sprout ([id number]) on 08/28/2023 4:26:42 PM  Radiology DG Chest Port 1 View Result Date: 08/28/2023 CLINICAL DATA:  One-week history of fatigue, dizziness, and productive cough EXAM: PORTABLE CHEST 1 VIEW COMPARISON:  Chest radiograph dated 05/29/2023 FINDINGS: Normal lung volumes. No focal consolidations. Bibasilar linear opacities. No pleural effusion or pneumothorax. The heart size and mediastinal contours are within normal limits. No acute osseous abnormality. Left axillary surgical clips. Additional clips project over the right upper quadrant. IMPRESSION: Bibasilar linear opacities, likely atelectasis. No focal consolidations. Electronically Signed   By: Agustin Cree M.D.   On: 08/28/2023 16:12   CT ABDOMEN PELVIS W CONTRAST Result Date: 08/28/2023 CLINICAL DATA:  Acute abdominal pain, fatigue, dizziness, productive cough for 1 week, history of metastatic breast cancer EXAM: CT ABDOMEN AND PELVIS WITH  CONTRAST TECHNIQUE: Multidetector CT imaging of the abdomen and pelvis was performed using the standard protocol following bolus administration of intravenous contrast. RADIATION DOSE REDUCTION: This exam was performed according to the departmental dose-optimization program which includes automated exposure control, adjustment of the mA and/or kV according to patient size and/or use of iterative reconstruction technique. CONTRAST:  OMNIPAQUE IOHEXOL 300 MG/ML  SOLN COMPARISON:  06/18/2023, 08/10/2023 FINDINGS: Lower chest: No acute pleural or parenchymal lung disease. Stable hiatal hernia. Hepatobiliary: No focal liver abnormality is seen. Status post cholecystectomy. No biliary dilatation. Pancreas: Unremarkable. No pancreatic ductal dilatation or surrounding inflammatory changes. Spleen: Normal in size without focal abnormality. Adrenals/Urinary Tract: Stable appearance of  the kidneys, with no urinary tract calculi or obstructive uropathy. The adrenals and bladder are unremarkable. Stomach/Bowel: No bowel obstruction or ileus. Normal appendix right lower quadrant. Diverticulosis throughout the colon with no evidence of acute diverticulitis. No bowel wall thickening or inflammatory change. Vascular/Lymphatic: Aortic atherosclerosis. No enlarged abdominal or pelvic lymph nodes. Reproductive: Uterus and bilateral adnexa are unremarkable. Other: No free fluid or free intraperitoneal gas. No abdominal wall hernia. Musculoskeletal: There are no acute displaced fractures. Subtle areas of sclerosis are again seen adjacent to the sacroiliac joints. Stable areas of sclerosis involve the posterior elements of the mid to lower thoracic spine. Stable postsurgical changes within the breasts. Reconstructed images demonstrate no additional findings. IMPRESSION: 1. No acute intra-abdominal or intrapelvic process. 2. Diffuse colonic diverticulosis without diverticulitis. 3. Stable hiatal hernia. 4. Stable sclerotic bony  metastases. Electronically Signed   By: Sharlet Salina M.D.   On: 08/28/2023 15:57    Procedures Procedures    Medications Ordered in ED Medications  iohexol (OMNIPAQUE) 300 MG/ML solution 100 mL (100 mLs Intravenous Contrast Given 08/28/23 1440)    ED Course/ Medical Decision Making/ A&P                                 Medical Decision Making Amount and/or Complexity of Data Reviewed Labs: ordered. Radiology: ordered.  Risk Prescription drug management.    This patient presents to the ED for concern of abdominal pain, this involves an extensive number of treatment options, and is a complaint that carries with it a high risk of complications and morbidity.  The differential diagnosis includes gastroenteritis, colitis, small bowel obstruction, appendicitis, cholecystitis, pancreatitis, nephrolithiasis, UTI, pyelonephritis   Co morbidities that complicate the patient evaluation  metastatic breast cancer, depression, ETOH abuse, headaches, HLD, HTN   Additional history obtained:  It does not appear the patient follows with PCP.  Will refer to community clinic.   Problem List / ED Course / Critical interventions / Medication management  Patient presented for abdominal pain, fatigue, productive cough, dizziness x1 week. Physical exam with mild tenderness to palpation just superior to umbilicus. Rest of physical exam unremarkable. Patient afebrile with stable vitals.  I Ordered, and personally interpreted labs.  CMP unremarkable.  CBG 97.  UA not concerning for infection.  Lipase within normal limits.  CBC without leukocytosis.  There is mild anemia with hemoglobin at 10.9 - patient denying any hematemesis/hematuria/hematochezia.  Respiratory panel negative. I ordered imaging studies including CT Abd/Pelvis with contrast: evaluate for structural/surgical etiology of patients' severe abdominal pain. I independently visualized and interpreted imaging and I agree with the radiologist  interpretation of no acute process. I personally viewed and interpreted the EKG/cardiac monitored which showed an underlying rhythm of: sinus rhythm Overall, patient is well appearing and ambulating well in the ED. it appears the patient's symptoms are due to a mild URI.  Recommended following up with PCP.  Answered all patient's questions.  Patient verbalized understanding of plan. Staffed with Dr. Anitra Lauth who agreed with plan. I have reviewed the patients home medicines and have made adjustments as needed Patient afebrile with stable vitals.  Provided with return precautions.  Discharged in good condition.  Ddx: These are considered less likely due to history of present illness and physical exam. -gastroenteritis: No vomiting in ED; no fever; tolerating PO intake -colitis: Denies diarrhea, patient afebrile  -small bowel obstruction: Last BM today  -appendicitis: CT reassuring, no leukocytosis -  cholecystitis: Negative Murphy sign; liver enzymes within normal limits  -pancreatitis: CT reassuring; lipase within normal limits  -UTI/pyelonephritis: Denies urinary complaints; UA reassuring   Social Determinants of Health:  none          Final Clinical Impression(s) / ED Diagnoses Final diagnoses:  Viral upper respiratory tract infection    Rx / DC Orders ED Discharge Orders     None         Margarita Rana 08/28/23 1633    Gwyneth Sprout, MD 08/30/23 [phone]

## 2023-08-28 NOTE — ED Triage Notes (Signed)
Pt arrived via POV. C/o fatigue, dizziness, and productive cough for 1x week. Has not been drinking much water.  Metastatic breast cancer.

## 2023-08-28 NOTE — Discharge Instructions (Addendum)
It was a pleasure caring for you today.  Please follow-up with your primary care provider.  Seek emergency care if experiencing any new or worsening symptoms.  I have had good luck with [facility] here in [place] for quick appointments. Please call them today at [phone] for an appointment.

## 2023-08-31 ENCOUNTER — Inpatient Hospital Stay (HOSPITAL_BASED_OUTPATIENT_CLINIC_OR_DEPARTMENT_OTHER): Payer: 59 | Admitting: Hematology and Oncology

## 2023-08-31 ENCOUNTER — Inpatient Hospital Stay: Payer: 59 | Attending: Internal Medicine

## 2023-08-31 VITALS — BP 141/73 | HR 93 | Temp 97.8°F | Resp 16 | Wt 198.7 lb

## 2023-08-31 DIAGNOSIS — C7951 Secondary malignant neoplasm of bone: Secondary | ICD-10-CM | POA: Diagnosis present

## 2023-08-31 DIAGNOSIS — J069 Acute upper respiratory infection, unspecified: Secondary | ICD-10-CM | POA: Diagnosis not present

## 2023-08-31 DIAGNOSIS — Z170 Estrogen receptor positive status [ER+]: Secondary | ICD-10-CM | POA: Diagnosis not present

## 2023-08-31 DIAGNOSIS — C50412 Malignant neoplasm of upper-outer quadrant of left female breast: Secondary | ICD-10-CM | POA: Diagnosis present

## 2023-08-31 DIAGNOSIS — F32A Depression, unspecified: Secondary | ICD-10-CM | POA: Insufficient documentation

## 2023-08-31 DIAGNOSIS — Z79811 Long term (current) use of aromatase inhibitors: Secondary | ICD-10-CM | POA: Insufficient documentation

## 2023-08-31 DIAGNOSIS — G479 Sleep disorder, unspecified: Secondary | ICD-10-CM | POA: Diagnosis not present

## 2023-08-31 LAB — COMPREHENSIVE METABOLIC PANEL
ALT: 15 U/L (ref 0–44)
AST: 16 U/L (ref 15–41)
Albumin: 3.7 g/dL (ref 3.5–5.0)
Alkaline Phosphatase: 116 U/L (ref 38–126)
Anion gap: 5 (ref 5–15)
BUN: 14 mg/dL (ref 8–23)
CO2: 27 mmol/L (ref 22–32)
Calcium: 8.8 mg/dL — ABNORMAL LOW (ref 8.9–10.3)
Chloride: 108 mmol/L (ref 98–111)
Creatinine, Ser: 0.75 mg/dL (ref 0.44–1.00)
GFR, Estimated: >60 mL/min (ref >60)
Glucose, Bld: 88 mg/dL (ref 70–99)
Potassium: 3.7 mmol/L (ref 3.5–5.1)
Sodium: 140 mmol/L (ref 135–145)
Total Bilirubin: 0.3 mg/dL (ref 0.0–1.2)
Total Protein: 6.5 g/dL (ref 6.5–8.1)

## 2023-08-31 LAB — CBC WITH DIFFERENTIAL/PLATELET
Abs Immature Granulocytes: 0.01 10*3/uL (ref 0.00–0.07)
Basophils Absolute: 0 10*3/uL (ref 0.0–0.1)
Basophils Relative: 0 %
Eosinophils Absolute: 0.1 10*3/uL (ref 0.0–0.5)
Eosinophils Relative: 2 %
HCT: 36.7 % (ref 36.0–46.0)
Hemoglobin: 11.4 g/dL — ABNORMAL LOW (ref 12.0–15.0)
Immature Granulocytes: 0 %
Lymphocytes Relative: 12 %
Lymphs Abs: 0.6 10*3/uL — ABNORMAL LOW (ref 0.7–4.0)
MCH: 26.4 pg (ref 26.0–34.0)
MCHC: 31.1 g/dL (ref 30.0–36.0)
MCV: 85 fL (ref 80.0–100.0)
Monocytes Absolute: 0.6 10*3/uL (ref 0.1–1.0)
Monocytes Relative: 12 %
Neutro Abs: 3.9 10*3/uL (ref 1.7–7.7)
Neutrophils Relative %: 74 %
Platelets: 234 10*3/uL (ref 150–400)
RBC: 4.32 MIL/uL (ref 3.87–5.11)
RDW: 15.8 % — ABNORMAL HIGH (ref 11.5–15.5)
WBC: 5.3 10*3/uL (ref 4.0–10.5)
nRBC: 0 % (ref 0.0–0.2)

## 2023-08-31 NOTE — Progress Notes (Signed)
 [facility] Cancer Follow up:    Patient, No Pcp Per No address on file   DIAGNOSIS:  Cancer Staging  Malignant neoplasm of upper-outer quadrant of left breast in female, estrogen receptor positive (HCC) Staging form: Breast, AJCC 8th Edition - Clinical: Stage IIA (cT2, cN1, cM0, G2, ER+, PR+, HER2-) - Signed by Serena Croissant, MD on 12/05/2021 Stage prefix: Initial diagnosis Histologic grading system: 3 grade system - Clinical: Stage IV (cM1) - Signed by Loa Socks, NP on 01/16/2023   SUMMARY OF ONCOLOGIC HISTORY: Oncology History  Malignant neoplasm of upper-outer quadrant of left breast in female, estrogen receptor positive (HCC)  11/05/2021 Initial Diagnosis   Palpable lump in the left breast with the pain along with right-sided nipple discharge.  Mammogram reveals highly suspicious mass left breast 3 o'clock position 2.2 cm, 2 abnormal left axillary lymph nodes: Biopsy: Grade 2 IDC ER 100%, PR 0%, Ki-67 10%, HER2 2+ by IHC, FISH negative ratio 1.4, axillary lymph node biopsy: IDC, ER 100%, PR 20%, Ki-67 10%, HER2 negative ratio 1.31   11/28/2021 Breast MRI   Left breast cancer 2.7 cm and 0.7 cm linear enhancement UOQ left breast needs biopsy, 0.5 cm mass posterior to the right nipple needs biopsy, 2-lymph nodes left axilla, 1.5 cm sternal lesion   12/05/2021 Cancer Staging   Staging form: Breast, AJCC 8th Edition - Clinical: Stage IIA (cT2, cN1, cM0, G2, ER+, PR+, HER2-) - Signed by Serena Croissant, MD on 12/05/2021 Stage prefix: Initial diagnosis Histologic grading system: 3 grade system   01/15/2022 Pathology Results   SURGICAL PATHOLOGY  CASE: [id number]  PATIENT: Tracie Atkins  Surgical Pathology Report      Clinical History: left breast cancer (cm)   FINAL MICROSCOPIC DIAGNOSIS:   A. BREAST, LEFT, LUMPECTOMY:  Invasive ductal carcinoma with clear margins of resection.  Please see the synoptic report after specimen D.   B. BREAST, RIGHT,  LUMPECTOMY:  Fibrous scar with hemosiderin deposits.  Carcinoma is not identified in the resected specimen.   C. LYMPH NODE, LEFT AXILLARY #2, SENTINEL, EXCISION:  A lymph node negative for metastatic carcinoma.   D. 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   pTNM classification ( AJCC 8th Edition): pT2, pN1a  Results of prognostic markers performed on prior biopsy from 11/05/2021  ([id number])           ER: Positive in 100% of tumor cells.           PR: Negative.           Ki-67: Positive in 10% of tumor cells.           HER-2/neu: 2+ by IHC/ Negative by FISH.    02/17/2022 Oncotype testing   Oncotype of 11, no benefit from chemotherapy.   03/26/2022 - 04/11/2022 Radiation Therapy   9 fractions totaling 16.2 Gy prior to relocation to [place]   10/20/2022 PET scan   1. Multifocal hypermetabolic skeletal metastasis involving the sternum, T6 vertebral body, T5 and T7 posterior elements, and inferior RIGHT scapula. 2. Hypermetabolic LEFT internal mammary lymph node most consistent with metastatic adenopathy. 3. Postsurgical change in the RIGHT and LEFT breast. No residual carcinoma identified in the breasts or axilla by FDG PET imaging.     10/23/2022 - 11/05/2022 Radiation Therapy   Plan Name: Chest Site: Thorax including T5-7 and sternum Technique: 3D Mode: Photon Dose Per Fraction: 3 Gy Prescribed Dose (Delivered /  Prescribed): 30 Gy / 30 Gy Prescribed Fxs (Delivered / Prescribed): 10 / 10   11/05/2022 -  Anti-estrogen oral therapy   Anastrozole daily   12/16/2022 Imaging   IMPRESSION: 1. Osseous metastases T3 through T7 levels as above. Mild chronic compression deformity of T3, possibly pathologic. No extra osseous or epidural extension of tumor. 2. Mild multilevel degenerative spondylosis as above. No significant stenosis or overt neural impingement.     01/16/2023 Cancer Staging   Staging form:  Breast, AJCC 8th Edition - Clinical: Stage IV (cM1) - Signed by Loa Socks, NP on 01/16/2023     CURRENT THERAPY: Anastrozole  INTERVAL HISTORY:  Tracie Atkins 68 y.o. female returns for f/u of her metastatic breast cancer.    Tracie Atkins is a 68 year old female with metastatic breast cancer who presents for follow-up of her conditions. Her son, who usually accompanies her, was not present today due to work commitments.  She is currently taking anastrozole daily for her breast cancer and has been compliant with this medication. Recent scans on February 3rd showed no new findings, only known bone metastasis. She has not started the new medication Ibrance prescribed due to a dental infection and plans to begin the medication next week after her dental procedure scheduled for Thursday.  She has stopped taking her depression medication, Lexapro, due to concerns about weight gain. Her depression is returning and affecting her daily life. She has three pills left and plans to contact her primary care doctor for a refill.  She visited the emergency room on February 21st due to symptoms of weakness, dizziness, and blurry vision. She was diagnosed with an upper respiratory infection. She underwent blood work, a CT scan, and an EKG, which did not reveal any immediate concerns. She reports feeling 'tired' and notes that her depression is worsening.  She is currently dealing with a dental infection requiring extraction.  She reports difficulty sleeping, attributing it to a racing mind. No changes in her living situation and confirms she is eating regularly.  Rest of the pertinent 10 point ROS reviewed and negative  Patient Active Problem List   Diagnosis Date Noted   Metastasis to bone (HCC) 01/16/2023   Vitamin D deficiency 01/16/2023   Seasonal allergies 01/16/2023   Obesity (BMI 30.0-34.9) 01/16/2023   Hyperlipidemia 01/16/2023   Essential hypertension 01/16/2023   Alcohol  use disorder, severe, dependence (HCC) 11/07/2022   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   Malignant neoplasm of upper-outer quadrant of left breast in female, estrogen receptor positive (HCC) 12/05/2021   Severe episode of recurrent major depressive disorder, without psychotic features (HCC) 08/19/2021   Suicidal ideations    Alcohol abuse with alcohol-induced mood disorder (HCC) 11/20/2016    has no known allergies.  MEDICAL HISTORY: Past Medical History:  Diagnosis Date   Breast cancer (HCC) 2023   left   Chronic dental infection 8/189   multiple teeth removed and Post -op infection.   Depression    ETOH abuse    Fatty liver    Headache     SURGICAL HISTORY: Past Surgical History:  Procedure Laterality Date   BREAST LUMPECTOMY WITH RADIOACTIVE SEED AND SENTINEL LYMPH NODE BIOPSY Left 01/15/2022   Procedure: LEFT BREAST SEED LOCALIZED LUMPECTOMY, LEFT SEED TARGETED AXILLARY LYMPH NODE BIOPSY, LEFT SENTINEL LYMPH NODE BIOPSY;  Surgeon: [name], MD;  Location: [facility] OR;  Service: General;  Laterality: Left;   DENTAL SURGERY     RADIOACTIVE SEED GUIDED EXCISIONAL BREAST  BIOPSY Right 01/15/2022   Procedure: RIGHT BREAST SEED LOCALIZED EXCISIONAL BIOPSY;  Surgeon: [name], MD;  Location: [facility] OR;  Service: General;  Laterality: Right;   TUBAL LIGATION      SOCIAL HISTORY: Social History   Socioeconomic History   Marital status: Single    Spouse name: Not on file   Number of children: 3   Years of education: Not on file   Highest education level: Not on file  Occupational History   Not on file  Tobacco Use   Smoking status: Never    Passive exposure: Never   Smokeless tobacco: Never  Vaping Use   Vaping status: Never Used  Substance and Sexual Activity   Alcohol use: Yes    Alcohol/week: 1.0 standard drink of alcohol    Types: 1 Standard drinks or equivalent per week    Comment: 1 mixed (liquor) drink per week   Drug use: No   Sexual activity: Never  Other Topics  Concern   Not on file  Social History Narrative   Not on file   Social Drivers of Health   Financial Resource Strain: Not on file  Food Insecurity: Low Risk  (06/30/2023)   Received from [facility]   Hunger Vital Sign    Worried About Running Out of Food in the Last Year: Never true    Ran Out of Food in the Last Year: Never true  Transportation Needs: No Transportation Needs (06/30/2023)   Received from [facility]    In the past 12 months, has lack of reliable transportation kept you from medical appointments, meetings, work or from getting things needed for daily living? : No  Physical Activity: Not on file  Stress: Not on file  Social Connections: Not on file  Intimate Partner Violence: Not At Risk (10/16/2022)   Humiliation, Afraid, Rape, and Kick questionnaire    Fear of Current or Ex-Partner: No    Emotionally Abused: No    Physically Abused: No    Sexually Abused: No    FAMILY HISTORY: No family history on file.  Review of Systems  Constitutional:  Negative for appetite change, chills, fatigue, fever and unexpected weight change.  HENT:   Negative for hearing loss, lump/mass and trouble swallowing.   Eyes:  Negative for eye problems and icterus.  Respiratory:  Negative for chest tightness, cough and shortness of breath.   Cardiovascular:  Negative for chest pain, leg swelling and palpitations.  Gastrointestinal:  Negative for abdominal distention, abdominal pain, constipation, diarrhea, nausea and vomiting.  Endocrine: Negative for hot flashes.  Genitourinary:  Negative for difficulty urinating.   Musculoskeletal:  Negative for arthralgias.  Skin:  Negative for itching and rash.  Neurological:  Negative for dizziness, extremity weakness, headaches and numbness.  Hematological:  Negative for adenopathy. Does not bruise/bleed easily.  Psychiatric/Behavioral:  Negative for depression and suicidal ideas. The patient is not nervous/anxious.        PHYSICAL EXAMINATION   Vitals:   08/31/23 0826  BP: (!) 141/73  Pulse: 93  Resp: 16  Temp: 97.8 F (36.6 C)  SpO2: 100%     Physical Exam Constitutional:      General: She is not in acute distress.    Appearance: Normal appearance. She is not toxic-appearing.  HENT:     Head: Normocephalic and atraumatic.     Mouth/Throat:     Mouth: Mucous membranes are moist.     Pharynx: Oropharynx is clear. No oropharyngeal exudate  or posterior oropharyngeal erythema.  Eyes:     General: No scleral icterus. Cardiovascular:     Rate and Rhythm: Normal rate and regular rhythm.     Pulses: Normal pulses.     Heart sounds: Normal heart sounds.  Pulmonary:     Effort: Pulmonary effort is normal.     Breath sounds: Normal breath sounds.  Abdominal:     General: Abdomen is flat. Bowel sounds are normal. There is no distension.     Palpations: Abdomen is soft.     Tenderness: There is no abdominal tenderness.  Musculoskeletal:        General: No swelling.     Cervical back: Neck supple.  Lymphadenopathy:     Cervical: No cervical adenopathy.  Skin:    General: Skin is warm and dry.     Findings: No rash.  Neurological:     General: No focal deficit present.     Mental Status: She is alert.  Psychiatric:        Mood and Affect: Mood normal.        Behavior: Behavior normal.     LABORATORY DATA:  CBC    Component Value Date/Time   WBC 5.3 08/31/2023 0811   RBC 4.32 08/31/2023 0811   HGB 11.4 (L) 08/31/2023 0811   HCT 36.7 08/31/2023 0811   PLT 234 08/31/2023 0811   MCV 85.0 08/31/2023 0811   MCH 26.4 08/31/2023 0811   MCHC 31.1 08/31/2023 0811   RDW 15.8 (H) 08/31/2023 0811   LYMPHSABS 0.6 (L) 08/31/2023 0811   MONOABS 0.6 08/31/2023 0811   EOSABS 0.1 08/31/2023 0811   BASOSABS 0.0 08/31/2023 0811    CMP     Component Value Date/Time   NA 137 08/28/2023 1303   K 3.9 08/28/2023 1303   CL 106 08/28/2023 1303   CO2 22 08/28/2023 1303   GLUCOSE 88 08/28/2023  1303   BUN 16 08/28/2023 1303   CREATININE 0.59 08/28/2023 1303   CREATININE 0.68 07/27/2023 1103   CALCIUM 8.6 (L) 08/28/2023 1303   PROT 6.2 (L) 08/28/2023 1303   ALBUMIN 3.3 (L) 08/28/2023 1303   AST 16 08/28/2023 1303   AST 15 07/27/2023 1103   ALT 15 08/28/2023 1303   ALT 12 07/27/2023 1103   ALKPHOS 101 08/28/2023 1303   BILITOT 0.5 08/28/2023 1303   BILITOT 0.3 07/27/2023 1103   GFRNONAA >60 08/28/2023 1303   GFRNONAA >60 07/27/2023 1103   GFRAA >60 06/12/2018 1323    ASSESSMENT and THERAPY PLAN:   Malignant neoplasm of upper-outer quadrant of left breast in female, estrogen receptor positive (HCC) Tracie Atkins is a 68 year old woman with stage IV breast cancer metastatic to the bone.  She continues on anastrozole.  Metastatic Breast Cancer Breast Cancer with Bone Metastasis   Stable on Anastrozole , Ibrance not yet started. Recent imaging shows no new metastasis.   -Continue Anastrozole and Ibrance as prescribed.   -Return for follow-up in 4 weeks.    Depression   Patient stopped taking Lexapro due to concerns about weight gain. Discussed the importance of managing depression and the potential consequences of abrupt discontinuation of antidepressants.   -Resume Lexapro immediately.   -Contact primary care provider for a refill of Lexapro.    Upper Respiratory Infection   Recent ER visit for symptoms of upper respiratory infection. No medications prescribed and symptoms appear to be resolving.   -Continue to monitor symptoms.    Dental Health   Pending dental extraction due  to infection. Discussed the need for dental clearance prior to starting bisphosphonates due to risk of osteonecrosis of the jaw.   -Ensure infection is resolved and dentist approves before starting Ibrance.   -Contact dentist Dr. Lance Morin in [place] to confirm she has sent the necessary paperwork to start zometa.  Sleep Disturbance   Reports difficulty sleeping, likely related to racing  thoughts.   -Monitor and address with PCP as part of ongoing management of depression.   All questions were answered. The patient knows to call the clinic with any problems, questions or concerns. We can certainly see the patient much sooner if necessary.  Total encounter time:30 minutes*in face-to-face visit time, chart review, lab review, care coordination, order entry, and documentation of the encounter time.   *Total Encounter Time as defined by the Centers for Medicare and Medicaid Services includes, in addition to the face-to-face time of a patient visit (documented in the note above) non-face-to-face time: obtaining and reviewing outside history, ordering and reviewing medications, tests or procedures, care coordination (communications with other health care professionals or caregivers) and documentation in the medical record.

## 2023-08-31 NOTE — Assessment & Plan Note (Signed)
 Tracie Atkins is a 68 year old woman with stage IV breast cancer metastatic to the bone.  She continues on anastrozole.  Metastatic Breast Cancer Breast Cancer with Bone Metastasis   Stable on Anastrozole , Ibrance not yet started. Recent imaging shows no new metastasis.   -Continue Anastrozole and Ibrance as prescribed.   -Return for follow-up in 4 weeks.    Depression   Patient stopped taking Lexapro due to concerns about weight gain. Discussed the importance of managing depression and the potential consequences of abrupt discontinuation of antidepressants.   -Resume Lexapro immediately.   -Contact primary care provider for a refill of Lexapro.    Upper Respiratory Infection   Recent ER visit for symptoms of upper respiratory infection. No medications prescribed and symptoms appear to be resolving.   -Continue to monitor symptoms.    Dental Health   Pending dental extraction due to infection. Discussed the need for dental clearance prior to starting bisphosphonates due to risk of osteonecrosis of the jaw.   -Ensure infection is resolved and dentist approves before starting Ibrance.   -Contact dentist Dr. Lance Morin in [place] to confirm she has sent the necessary paperwork to start zometa.  Sleep Disturbance   Reports difficulty sleeping, likely related to racing thoughts.   -Monitor and address with PCP as part of ongoing management of depression.

## 2023-09-02 ENCOUNTER — Encounter: Payer: Self-pay | Admitting: *Deleted

## 2023-09-22 ENCOUNTER — Other Ambulatory Visit: Payer: Self-pay

## 2023-09-25 ENCOUNTER — Telehealth: Payer: Self-pay

## 2023-09-25 NOTE — Telephone Encounter (Signed)
 Left message on voicemail for patient about appointment on 09/28/23

## 2023-09-28 ENCOUNTER — Emergency Department (HOSPITAL_COMMUNITY)
Admission: EM | Admit: 2023-09-28 | Discharge: 2023-09-29 | Discharge status: Against Medical Advice | Attending: Emergency Medicine | Admitting: Emergency Medicine

## 2023-09-28 ENCOUNTER — Encounter (HOSPITAL_COMMUNITY): Payer: Self-pay

## 2023-09-28 ENCOUNTER — Other Ambulatory Visit: Payer: Self-pay

## 2023-09-28 ENCOUNTER — Inpatient Hospital Stay: Payer: 59 | Admitting: Hematology and Oncology

## 2023-09-28 ENCOUNTER — Inpatient Hospital Stay: Payer: 59

## 2023-09-28 DIAGNOSIS — H538 Other visual disturbances: Secondary | ICD-10-CM | POA: Diagnosis not present

## 2023-09-28 DIAGNOSIS — M79602 Pain in left arm: Secondary | ICD-10-CM | POA: Insufficient documentation

## 2023-09-28 DIAGNOSIS — Z5321 Procedure and treatment not carried out due to patient leaving prior to being seen by health care provider: Secondary | ICD-10-CM | POA: Insufficient documentation

## 2023-09-28 NOTE — ED Triage Notes (Signed)
 Pt arrived POV from home c/o intermittent left arm pain from the shoulder down that started at 4pm. Pt also states she woke up with blurry vision today and that is not normal. Pt's LKW was 9pm yesterday evening 3/23.

## 2023-09-28 NOTE — ED Notes (Signed)
 Called pt x3, no answer.  KM

## 2023-09-29 ENCOUNTER — Other Ambulatory Visit (HOSPITAL_COMMUNITY): Payer: Self-pay

## 2023-09-29 ENCOUNTER — Other Ambulatory Visit: Payer: Self-pay

## 2023-09-30 ENCOUNTER — Ambulatory Visit (HOSPITAL_COMMUNITY)
Admission: EM | Admit: 2023-09-30 | Discharge: 2023-09-30 | Disposition: A | Discharge status: Home / Self Care | Attending: Nurse Practitioner | Admitting: Nurse Practitioner

## 2023-09-30 DIAGNOSIS — Z9151 Personal history of suicidal behavior: Secondary | ICD-10-CM | POA: Diagnosis not present

## 2023-09-30 DIAGNOSIS — R4584 Anhedonia: Secondary | ICD-10-CM | POA: Insufficient documentation

## 2023-09-30 DIAGNOSIS — Z79899 Other long term (current) drug therapy: Secondary | ICD-10-CM | POA: Diagnosis not present

## 2023-09-30 DIAGNOSIS — R630 Anorexia: Secondary | ICD-10-CM | POA: Diagnosis not present

## 2023-09-30 DIAGNOSIS — Z5900 Homelessness unspecified: Secondary | ICD-10-CM | POA: Diagnosis not present

## 2023-09-30 DIAGNOSIS — C50919 Malignant neoplasm of unspecified site of unspecified female breast: Secondary | ICD-10-CM | POA: Diagnosis not present

## 2023-09-30 DIAGNOSIS — R5383 Other fatigue: Secondary | ICD-10-CM | POA: Diagnosis not present

## 2023-09-30 DIAGNOSIS — F329 Major depressive disorder, single episode, unspecified: Secondary | ICD-10-CM | POA: Insufficient documentation

## 2023-09-30 NOTE — ED Provider Notes (Signed)
 [facility]al [facility] Urgent [facility] Medical Screening Exam  Patient Name: Tracie Atkins MRN: [id number] Date of Evaluation: 09/30/23 Chief Complaint:  Worsening depressive symptoms, needs to establish care for psychotropic medication management. Diagnosis:  Final diagnoses:  Medication management   History of Present illness: Tracie Atkins is a 68 y.o. female with a history of MDD who presents today for worsening depressive symptoms in the context of having metastatic breast cancer.   Assessment & Review of Psychiatry symptoms: Patient presents with her daughter in Law Tracie Atkins), requesting medication management of her depressive symptoms, as well as get a therapist whom she can talk to about her daily struggles living with cancer. Pt reports that she found out 3 years ago that her cancer had metastasized to her back and bones. Patient reports trouble with sleep, reports anhedonia, isolation at home, decreased energy levels, poor appetite, describes symptoms consistent with psychomotor retardation, reports feelings of hopelessness & worthlessness, report worrying a lot, feeling on edge and tense all the time for at least the past 3 months.  Pt reports being on medications in the past (seroquel, did not like the weight gain that came with this medication). Reports Trazodone in the past, for sleep, but no medications in the past at least one year.  Patient reports a suicide attempt in the distant past, states that she was homeless in [place] in 2001, saw someone who had hung himself, and later attempted suicide, reports hospitalization at that time, but non recently. She reports current stressor being her cancer diagnosis, denies any other concerns, denies AVH, denies paranoia, denies delusional thinking, denies first rank symptoms.   Pt states that she goes to [facility] for her cancer care, and has not been linked to a support system. Writer called the [facility], got patient an appointment for  outpatient medication management & therapy. Med management set for 04/25th at  9am with Dr. Lolly Mustache, and therapy appt for 8am on 04/30. Patient and daughter in law educated on above, deny safety concerns at this time, both deny SI/HI/AVH, left this site in no apparent distress.   Flowsheet Row ED from 09/30/2023 in [facility] ED from 09/28/2023 in [facility] Emergency Department at [facility] ED from 08/28/2023 in [facility] Emergency Department at [facility]  C-SSRS RISK CATEGORY No Risk No Risk No Risk      Psychiatric Specialty Exam  Presentation  General Appearance:Appropriate for Environment  Eye Contact:Good  Speech:Clear and Coherent  Speech Volume:Normal  Handedness:Right   Mood and Affect  Mood: Euthymic  Affect: Congruent   Thought Process  Thought Processes: Coherent  Descriptions of Associations:Intact  Orientation:Full (Time, Place and Person)  Thought Content:WDL  Diagnosis of Schizophrenia or Schizoaffective disorder in past: No data recorded  Hallucinations:None  Ideas of Reference:None  Suicidal Thoughts:No With Intent; With Plan  Homicidal Thoughts:No   Sensorium  Memory: Immediate Good  Judgment: Good  Insight: Good   Executive Functions  Concentration: Good  Attention Span: Good  Recall: Good  Fund of Knowledge: Good  Language: Good   Psychomotor Activity  Psychomotor Activity: Normal   Assets  Assets: Communication Skills   Sleep  Sleep: Poor  Number of hours:  8   Physical Exam: Physical Exam Vitals and nursing note reviewed.  Neurological:     General: No focal deficit present.     Mental Status: She is oriented to person, place, and time.  Psychiatric:  Behavior: Behavior normal.    Review of Systems  Psychiatric/Behavioral:  Positive for depression. Negative for hallucinations, memory loss, substance abuse and suicidal ideas. The  patient is nervous/anxious and has insomnia.   All other systems reviewed and are negative.  Blood pressure (!) 146/97, pulse 90, temperature 97.9 F (36.6 C), temperature source Oral, resp. rate 16, SpO2 99%. There is no height or weight on file to calculate BMI.  Musculoskeletal: Strength & Muscle Tone: within normal limits Gait & Station: normal Patient leans: N/A   [facility] MSE Discharge Disposition for Follow up and Recommendations: Based on my evaluation the patient does not appear to have an emergency medical condition and can be discharged with resources and follow up care in outpatient services for Medication Management and Individual Therapy -Appointments made for outpatient management, there are no safety concerns at this time.  Starleen Blue, NP 09/30/2023, 7:09 PM

## 2023-09-30 NOTE — Progress Notes (Signed)
   09/30/23 1437  [facility] Triage Screening (Walk-ins at [facility] only)  How Did You Hear About [place]? Family/Friend  What Is the Reason for Your Visit/Call Today? Tracie Atkins presents to [facility] voluntarily unaccompanied. Pt states that she would like to get on some depression medication. Pt states that she has stage 4 cancer (started in her breast and now in her bones). Pt states that her depression has gotten worse over the past 3 years, since being in [place]. Pt currently denies SI, HI, AVH and alcohol/drug use.  How Long Has This Been Causing You Problems? > than 6 months  Have You Recently Had Any Thoughts About Hurting Yourself? No  Are You Planning to Commit Suicide/Harm Yourself At This time? No  Have you Recently Had Thoughts About Hurting Someone Tracie Atkins? No  Are You Planning To Harm Someone At This Time? No  Physical Abuse Denies  Verbal Abuse Denies  Sexual Abuse Denies  Exploitation of patient/patient's resources Denies  Self-Neglect Denies  Are you currently experiencing any auditory, visual or other hallucinations? No  Have You Used Any Alcohol or Drugs in the Past 24 Hours? No  Do you have any current medical co-morbidities that require immediate attention? Yes  Please describe current medical co-morbidities that require immediate attention: stage 4 cancer  Clinician description of patient physical appearance/behavior: pleasant, cooperative  What Do You Feel Would Help You the Most Today? Medication(s)  If access to [facility] was not available, would you have sought care in the Emergency Department? No  Determination of Need Routine (7 days)  Options For Referral Medication Management;Outpatient Therapy

## 2023-09-30 NOTE — Discharge Instructions (Signed)
 Please check MyChart for upcoming appoointments for medication management and therapy for next month. If you start having suicidal thoughts, homicidal thoughts of perceptual disturbances, please come back here, call [phone] or go to the nearest ER.

## 2023-09-30 NOTE — ED Notes (Signed)
Patient Is discharging at this time. Printed AVS reviewed with patient by provider along with follow up appointments and resources. Patient denies SI, HI, and A/V/H. Valuables/belongings returned to patient. No s/s of current distress.  

## 2023-10-01 ENCOUNTER — Telehealth: Payer: Self-pay

## 2023-10-01 NOTE — Assessment & Plan Note (Signed)
 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 Back pain noted, no significant cancer progression.  Pt has Ibrance prescription but hasn't started it, she wants to wait until her dental issues are taken care of and want to make sure her depression is better before she starts it. Emphasized depression management for therapy compliance. Agree with anastrozole alone for now, ok to add Ibrance at first progression if she is unable to start  Dental infection Gum swelling and pain indicate infection,  - Return to previous dentist for treatment. -We are unable to initiate bone strengtheners at this time given ongoing dental issues.  Depression - Attend behavioral health appointment on April 25th for initial consultation.  Cough Persistent cough likely due to upper respiratory tract infection. No adventitious sounds on exam today Will continue to monitor.  Follow-up Ensuring continuity of care and treatment adjustments. - Schedule follow-up appointment in four weeks. - Await lab results for further evaluation.

## 2023-10-01 NOTE — Progress Notes (Unsigned)
 [facility] Cancer Follow up:    Patient, No Pcp Per No address on file   DIAGNOSIS:  Cancer Staging  Malignant neoplasm of upper-outer quadrant of left breast in female, estrogen receptor positive (HCC) Staging form: Breast, AJCC 8th Edition - Clinical: Stage IIA (cT2, cN1, cM0, G2, ER+, PR+, HER2-) - Signed by Serena Croissant, MD on 12/05/2021 Stage prefix: Initial diagnosis Histologic grading system: 3 grade system - Clinical: Stage IV (cM1) - Signed by Loa Socks, NP on 01/16/2023   SUMMARY OF ONCOLOGIC HISTORY: Oncology History  Malignant neoplasm of upper-outer quadrant of left breast in female, estrogen receptor positive (HCC)  11/05/2021 Initial Diagnosis   Palpable lump in the left breast with the pain along with right-sided nipple discharge.  Mammogram reveals highly suspicious mass left breast 3 o'clock position 2.2 cm, 2 abnormal left axillary lymph nodes: Biopsy: Grade 2 IDC ER 100%, PR 0%, Ki-67 10%, HER2 2+ by IHC, FISH negative ratio 1.4, axillary lymph node biopsy: IDC, ER 100%, PR 20%, Ki-67 10%, HER2 negative ratio 1.31   11/28/2021 Breast MRI   Left breast cancer 2.7 cm and 0.7 cm linear enhancement UOQ left breast needs biopsy, 0.5 cm mass posterior to the right nipple needs biopsy, 2-lymph nodes left axilla, 1.5 cm sternal lesion   12/05/2021 Cancer Staging   Staging form: Breast, AJCC 8th Edition - Clinical: Stage IIA (cT2, cN1, cM0, G2, ER+, PR+, HER2-) - Signed by Serena Croissant, MD on 12/05/2021 Stage prefix: Initial diagnosis Histologic grading system: 3 grade system   01/15/2022 Pathology Results   SURGICAL PATHOLOGY  CASE: [id number]  PATIENT: Tracie Atkins  Surgical Pathology Report      Clinical History: left breast cancer (cm)   FINAL MICROSCOPIC DIAGNOSIS:   A. BREAST, LEFT, LUMPECTOMY:  Invasive ductal carcinoma with clear margins of resection.  Please see the synoptic report after specimen D.   B. BREAST, RIGHT,  LUMPECTOMY:  Fibrous scar with hemosiderin deposits.  Carcinoma is not identified in the resected specimen.   C. LYMPH NODE, LEFT AXILLARY #2, SENTINEL, EXCISION:  A lymph node negative for metastatic carcinoma.   D. 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   pTNM classification ( AJCC 8th Edition): pT2, pN1a  Results of prognostic markers performed on prior biopsy from 11/05/2021  ([id number])           ER: Positive in 100% of tumor cells.           PR: Negative.           Ki-67: Positive in 10% of tumor cells.           HER-2/neu: 2+ by IHC/ Negative by FISH.    02/17/2022 Oncotype testing   Oncotype of 11, no benefit from chemotherapy.   03/26/2022 - 04/11/2022 Radiation Therapy   9 fractions totaling 16.2 Gy prior to relocation to [place]   10/20/2022 PET scan   1. Multifocal hypermetabolic skeletal metastasis involving the sternum, T6 vertebral body, T5 and T7 posterior elements, and inferior RIGHT scapula. 2. Hypermetabolic LEFT internal mammary lymph node most consistent with metastatic adenopathy. 3. Postsurgical change in the RIGHT and LEFT breast. No residual carcinoma identified in the breasts or axilla by FDG PET imaging.     10/23/2022 - 11/05/2022 Radiation Therapy   Plan Name: Chest Site: Thorax including T5-7 and sternum Technique: 3D Mode: Photon Dose Per Fraction: 3 Gy Prescribed Dose (Delivered /  Prescribed): 30 Gy / 30 Gy Prescribed Fxs (Delivered / Prescribed): 10 / 10   11/05/2022 -  Anti-estrogen oral therapy   Anastrozole daily   12/16/2022 Imaging   IMPRESSION: 1. Osseous metastases T3 through T7 levels as above. Mild chronic compression deformity of T3, possibly pathologic. No extra osseous or epidural extension of tumor. 2. Mild multilevel degenerative spondylosis as above. No significant stenosis or overt neural impingement.     01/16/2023 Cancer Staging   Staging form:  Breast, AJCC 8th Edition - Clinical: Stage IV (cM1) - Signed by Loa Socks, NP on 01/16/2023     CURRENT THERAPY: Anastrozole  INTERVAL HISTORY:  Tracie Atkins 68 y.o. female returns for f/u of her metastatic breast cancer.    Tracie Atkins is a 68 year old female with metastatic breast cancer who presents for follow-up of her conditions. She was started on anastrozole alone because of non compliance issues, multiple missed appointments, however she was doing well hence we discussed about adding Ibrance. She has never started this yet.  She has a persistent cough described as a cold she 'cannot get rid of.' No specific details about the duration or associated symptoms like fever or shortness of breath are provided.  She is experiencing worsening depression and is not currently taking her depression medications, Seroquel and trazodone, as she is planning to change her prescription. She dislikes the weight gain caused by these medications. She reports increased sleep and decreased appetite, though she denies weight loss and maintains a weight of 'one ninety something.'  She is currently taking anastrozole for breast cancer treatment. She reports new onset back pain and attributes her increased sleep to depression. She is awaiting initiation of Ibrance, pending dental clearance and better control of depression  She mentions that her blood pressure remains high. No specific changes in management were discussed during the visit.  She has concerns about dental care, reporting swollen gums and pain when eating. She is seeking a new dentist after a negative experience with a previous one, where she was told she does not perform general anesthesia for dental procedures.   Rest of the pertinent 10 point ROS reviewed and negative  Patient Active Problem List   Diagnosis Date Noted   Metastasis to bone (HCC) 01/16/2023   Vitamin D deficiency 01/16/2023   Seasonal allergies 01/16/2023    Obesity (BMI 30.0-34.9) 01/16/2023   Hyperlipidemia 01/16/2023   Essential hypertension 01/16/2023   Alcohol use disorder, severe, dependence (HCC) 11/07/2022   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   Malignant neoplasm of upper-outer quadrant of left breast in female, estrogen receptor positive (HCC) 12/05/2021   Severe episode of recurrent major depressive disorder, without psychotic features (HCC) 08/19/2021   Suicidal ideations    Alcohol abuse with alcohol-induced mood disorder (HCC) 11/20/2016    has no known allergies.  MEDICAL HISTORY: Past Medical History:  Diagnosis Date   Breast cancer (HCC) 2023   left   Chronic dental infection 8/189   multiple teeth removed and Post -op infection.   Depression    ETOH abuse    Fatty liver    Headache     SURGICAL HISTORY: Past Surgical History:  Procedure Laterality Date   BREAST LUMPECTOMY WITH RADIOACTIVE SEED AND SENTINEL LYMPH NODE BIOPSY Left 01/15/2022   Procedure: LEFT BREAST SEED LOCALIZED LUMPECTOMY, LEFT SEED TARGETED AXILLARY LYMPH NODE BIOPSY, LEFT SENTINEL LYMPH NODE BIOPSY;  Surgeon: [name], MD;  Location: [facility] OR;  Service: General;  Laterality: Left;  DENTAL SURGERY     RADIOACTIVE SEED GUIDED EXCISIONAL BREAST BIOPSY Right 01/15/2022   Procedure: RIGHT BREAST SEED LOCALIZED EXCISIONAL BIOPSY;  Surgeon: [name], MD;  Location: [facility] OR;  Service: General;  Laterality: Right;   TUBAL LIGATION      SOCIAL HISTORY: Social History   Socioeconomic History   Marital status: Single    Spouse name: Not on file   Number of children: 3   Years of education: Not on file   Highest education level: Not on file  Occupational History   Not on file  Tobacco Use   Smoking status: Never    Passive exposure: Never   Smokeless tobacco: Never  Vaping Use   Vaping status: Never Used  Substance and Sexual Activity   Alcohol use: Yes    Alcohol/week: 1.0 standard drink of alcohol    Types: 1 Standard drinks or  equivalent per week    Comment: 1 mixed (liquor) drink per week   Drug use: No   Sexual activity: Never  Other Topics Concern   Not on file  Social History Narrative   Not on file   Social Drivers of Health   Financial Resource Strain: Not on file  Food Insecurity: Low Risk  (06/30/2023)   Received from [facility]   Hunger Vital Sign    Worried About Running Out of Food in the Last Year: Never true    Ran Out of Food in the Last Year: Never true  Transportation Needs: No Transportation Needs (06/30/2023)   Received from [facility]    In the past 12 months, has lack of reliable transportation kept you from medical appointments, meetings, work or from getting things needed for daily living? : No  Physical Activity: Not on file  Stress: Not on file  Social Connections: Not on file  Intimate Partner Violence: Not At Risk (10/16/2022)   Humiliation, Afraid, Rape, and Kick questionnaire    Fear of Current or Ex-Partner: No    Emotionally Abused: No    Physically Abused: No    Sexually Abused: No    FAMILY HISTORY: No family history on file.  Review of Systems  Constitutional:  Negative for appetite change, chills, fatigue, fever and unexpected weight change.  HENT:   Negative for hearing loss, lump/mass and trouble swallowing.   Eyes:  Negative for eye problems and icterus.  Respiratory:  Negative for chest tightness, cough and shortness of breath.   Cardiovascular:  Negative for chest pain, leg swelling and palpitations.  Gastrointestinal:  Negative for abdominal distention, abdominal pain, constipation, diarrhea, nausea and vomiting.  Endocrine: Negative for hot flashes.  Genitourinary:  Negative for difficulty urinating.   Musculoskeletal:  Negative for arthralgias.  Skin:  Negative for itching and rash.  Neurological:  Negative for dizziness, extremity weakness, headaches and numbness.  Hematological:  Negative for adenopathy. Does not bruise/bleed  easily.  Psychiatric/Behavioral:  Negative for depression and suicidal ideas. The patient is not nervous/anxious.       PHYSICAL EXAMINATION   Vitals:   10/02/23 0851 10/02/23 0853  BP: (!) 159/86 (!) 169/89  Pulse: 81   Resp: 17   Temp: 98.3 F (36.8 C)   SpO2: 100%       Physical Exam Constitutional:      General: She is not in acute distress.    Appearance: Normal appearance. She is not toxic-appearing.  HENT:     Head: Normocephalic and atraumatic.     Mouth/Throat:  Mouth: Mucous membranes are moist.     Pharynx: Oropharynx is clear. No oropharyngeal exudate or posterior oropharyngeal erythema.  Eyes:     General: No scleral icterus. Cardiovascular:     Rate and Rhythm: Normal rate and regular rhythm.     Pulses: Normal pulses.     Heart sounds: Normal heart sounds.  Pulmonary:     Effort: Pulmonary effort is normal.     Breath sounds: Normal breath sounds.  Abdominal:     General: Abdomen is flat. Bowel sounds are normal. There is no distension.     Palpations: Abdomen is soft.     Tenderness: There is no abdominal tenderness.  Musculoskeletal:        General: No swelling.     Cervical back: Neck supple.  Lymphadenopathy:     Cervical: No cervical adenopathy.  Skin:    General: Skin is warm and dry.     Findings: No rash.  Neurological:     General: No focal deficit present.     Mental Status: She is alert.  Psychiatric:        Mood and Affect: Mood normal.        Behavior: Behavior normal.     LABORATORY DATA:  CBC    Component Value Date/Time   WBC 5.2 10/02/2023 0829   RBC 4.34 10/02/2023 0829   HGB 11.1 (L) 10/02/2023 0829   HCT 35.3 (L) 10/02/2023 0829   PLT 249 10/02/2023 0829   MCV 81.3 10/02/2023 0829   MCH 25.6 (L) 10/02/2023 0829   MCHC 31.4 10/02/2023 0829   RDW 14.6 10/02/2023 0829   LYMPHSABS 0.9 10/02/2023 0829   MONOABS 0.7 10/02/2023 0829   EOSABS 0.1 10/02/2023 0829   BASOSABS 0.0 10/02/2023 0829    CMP      Component Value Date/Time   NA 139 10/02/2023 0829   K 3.8 10/02/2023 0829   CL 107 10/02/2023 0829   CO2 26 10/02/2023 0829   GLUCOSE 90 10/02/2023 0829   BUN 16 10/02/2023 0829   CREATININE 0.62 10/02/2023 0829   CREATININE 0.68 07/27/2023 1103   CALCIUM 8.9 10/02/2023 0829   PROT 6.5 10/02/2023 0829   ALBUMIN 3.8 10/02/2023 0829   AST 16 10/02/2023 0829   AST 15 07/27/2023 1103   ALT 16 10/02/2023 0829   ALT 12 07/27/2023 1103   ALKPHOS 134 (H) 10/02/2023 0829   BILITOT 0.3 10/02/2023 0829   BILITOT 0.3 07/27/2023 1103   GFRNONAA >60 10/02/2023 0829   GFRNONAA >60 07/27/2023 1103   GFRAA >60 06/12/2018 1323    ASSESSMENT and THERAPY PLAN:   Malignant neoplasm of upper-outer quadrant of left breast in female, estrogen receptor positive (HCC) Markesha is a 68 year old woman with 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 Back pain noted, no significant cancer progression.  Pt has Ibrance prescription but hasn't started it, she wants to wait until her dental issues are taken care of and want to make sure her depression is better before she starts it. Emphasized depression management for therapy compliance. Agree with anastrozole alone for now, ok to add Ibrance at first progression if she is unable to start  Dental infection Gum swelling and pain indicate infection,  - Return to previous dentist for treatment. -We are unable to initiate bone strengtheners at this time given ongoing dental issues.  Depression - Attend behavioral health appointment on April 25th for initial consultation.  Cough Persistent cough likely due to upper respiratory tract infection. No adventitious sounds on exam today Will continue to monitor.  Follow-up Ensuring continuity of care and treatment adjustments. - Schedule follow-up appointment in four weeks. - Await lab results for further evaluation.    All  questions were answered. The patient knows to call the clinic with any problems, questions or concerns. We can certainly see the patient much sooner if necessary.  Total encounter time:30 minutes*in face-to-face visit time, chart review, lab review, care coordination, order entry, and documentation of the encounter time.   *Total Encounter Time as defined by the Centers for Medicare and Medicaid Services includes, in addition to the face-to-face time of a patient visit (documented in the note above) non-face-to-face time: obtaining and reviewing outside history, ordering and reviewing medications, tests or procedures, care coordination (communications with other health care professionals or caregivers) and documentation in the medical record.

## 2023-10-01 NOTE — Telephone Encounter (Signed)
 Spoke with patient and confirmed appointment on 10/02/23

## 2023-10-02 ENCOUNTER — Inpatient Hospital Stay: Attending: Internal Medicine

## 2023-10-02 ENCOUNTER — Inpatient Hospital Stay: Admitting: Hematology and Oncology

## 2023-10-02 ENCOUNTER — Encounter: Payer: Self-pay | Admitting: Hematology and Oncology

## 2023-10-02 VITALS — BP 169/89 | HR 81 | Temp 98.3°F | Resp 17 | Wt 199.5 lb

## 2023-10-02 DIAGNOSIS — M549 Dorsalgia, unspecified: Secondary | ICD-10-CM | POA: Insufficient documentation

## 2023-10-02 DIAGNOSIS — F32A Depression, unspecified: Secondary | ICD-10-CM | POA: Diagnosis not present

## 2023-10-02 DIAGNOSIS — C7951 Secondary malignant neoplasm of bone: Secondary | ICD-10-CM | POA: Diagnosis present

## 2023-10-02 DIAGNOSIS — R059 Cough, unspecified: Secondary | ICD-10-CM | POA: Insufficient documentation

## 2023-10-02 DIAGNOSIS — Z170 Estrogen receptor positive status [ER+]: Secondary | ICD-10-CM | POA: Diagnosis not present

## 2023-10-02 DIAGNOSIS — Z79811 Long term (current) use of aromatase inhibitors: Secondary | ICD-10-CM | POA: Insufficient documentation

## 2023-10-02 DIAGNOSIS — C50412 Malignant neoplasm of upper-outer quadrant of left female breast: Secondary | ICD-10-CM | POA: Insufficient documentation

## 2023-10-02 LAB — CBC WITH DIFFERENTIAL/PLATELET
Abs Immature Granulocytes: 0.02 10*3/uL (ref 0.00–0.07)
Basophils Absolute: 0 10*3/uL (ref 0.0–0.1)
Basophils Relative: 1 %
Eosinophils Absolute: 0.1 10*3/uL (ref 0.0–0.5)
Eosinophils Relative: 2 %
HCT: 35.3 % — ABNORMAL LOW (ref 36.0–46.0)
Hemoglobin: 11.1 g/dL — ABNORMAL LOW (ref 12.0–15.0)
Immature Granulocytes: 0 %
Lymphocytes Relative: 17 %
Lymphs Abs: 0.9 10*3/uL (ref 0.7–4.0)
MCH: 25.6 pg — ABNORMAL LOW (ref 26.0–34.0)
MCHC: 31.4 g/dL (ref 30.0–36.0)
MCV: 81.3 fL (ref 80.0–100.0)
Monocytes Absolute: 0.7 10*3/uL (ref 0.1–1.0)
Monocytes Relative: 13 %
Neutro Abs: 3.5 10*3/uL (ref 1.7–7.7)
Neutrophils Relative %: 67 %
Platelets: 249 10*3/uL (ref 150–400)
RBC: 4.34 MIL/uL (ref 3.87–5.11)
RDW: 14.6 % (ref 11.5–15.5)
WBC: 5.2 10*3/uL (ref 4.0–10.5)
nRBC: 0 % (ref 0.0–0.2)

## 2023-10-02 LAB — COMPREHENSIVE METABOLIC PANEL WITH GFR
ALT: 16 U/L (ref 0–44)
AST: 16 U/L (ref 15–41)
Albumin: 3.8 g/dL (ref 3.5–5.0)
Alkaline Phosphatase: 134 U/L — ABNORMAL HIGH (ref 38–126)
Anion gap: 6 (ref 5–15)
BUN: 16 mg/dL (ref 8–23)
CO2: 26 mmol/L (ref 22–32)
Calcium: 8.9 mg/dL (ref 8.9–10.3)
Chloride: 107 mmol/L (ref 98–111)
Creatinine, Ser: 0.62 mg/dL (ref 0.44–1.00)
GFR, Estimated: >60 mL/min (ref >60)
Glucose, Bld: 90 mg/dL (ref 70–99)
Potassium: 3.8 mmol/L (ref 3.5–5.1)
Sodium: 139 mmol/L (ref 135–145)
Total Bilirubin: 0.3 mg/dL (ref 0.0–1.2)
Total Protein: 6.5 g/dL (ref 6.5–8.1)

## 2023-10-05 ENCOUNTER — Other Ambulatory Visit: Payer: Self-pay

## 2023-10-26 ENCOUNTER — Ambulatory Visit: Admitting: Hematology and Oncology

## 2023-10-28 ENCOUNTER — Other Ambulatory Visit: Payer: Self-pay

## 2023-10-29 ENCOUNTER — Telehealth: Payer: Self-pay | Admitting: Hematology and Oncology

## 2023-10-29 NOTE — Telephone Encounter (Signed)
 Patient scheduled appointments. Patient is aware of all appointment details.

## 2023-10-30 ENCOUNTER — Telehealth (HOSPITAL_BASED_OUTPATIENT_CLINIC_OR_DEPARTMENT_OTHER): Payer: Self-pay | Admitting: Psychiatry

## 2023-10-30 ENCOUNTER — Encounter (HOSPITAL_COMMUNITY): Payer: Self-pay

## 2023-10-30 ENCOUNTER — Other Ambulatory Visit: Payer: Self-pay

## 2023-10-30 DIAGNOSIS — Z91199 Patient's noncompliance with other medical treatment and regimen due to unspecified reason: Secondary | ICD-10-CM

## 2023-10-30 NOTE — Progress Notes (Signed)
 No show

## 2023-11-02 ENCOUNTER — Inpatient Hospital Stay

## 2023-11-02 ENCOUNTER — Ambulatory Visit (HOSPITAL_COMMUNITY)
Admission: RE | Admit: 2023-11-02 | Discharge: 2023-11-02 | Disposition: A | Discharge status: Home / Self Care | Source: Ambulatory Visit | Attending: Adult Health | Admitting: Adult Health

## 2023-11-02 ENCOUNTER — Encounter: Payer: Self-pay | Admitting: Adult Health

## 2023-11-02 ENCOUNTER — Inpatient Hospital Stay: Attending: Internal Medicine | Admitting: Adult Health

## 2023-11-02 VITALS — BP 131/72 | HR 106 | Temp 98.4°F | Resp 18 | Ht 64.0 in | Wt 196.4 lb

## 2023-11-02 DIAGNOSIS — C7951 Secondary malignant neoplasm of bone: Secondary | ICD-10-CM | POA: Insufficient documentation

## 2023-11-02 DIAGNOSIS — Z170 Estrogen receptor positive status [ER+]: Secondary | ICD-10-CM | POA: Insufficient documentation

## 2023-11-02 DIAGNOSIS — R0600 Dyspnea, unspecified: Secondary | ICD-10-CM | POA: Insufficient documentation

## 2023-11-02 DIAGNOSIS — C50412 Malignant neoplasm of upper-outer quadrant of left female breast: Secondary | ICD-10-CM | POA: Insufficient documentation

## 2023-11-02 DIAGNOSIS — C773 Secondary and unspecified malignant neoplasm of axilla and upper limb lymph nodes: Secondary | ICD-10-CM | POA: Diagnosis not present

## 2023-11-02 DIAGNOSIS — R200 Anesthesia of skin: Secondary | ICD-10-CM | POA: Insufficient documentation

## 2023-11-02 DIAGNOSIS — R4589 Other symptoms and signs involving emotional state: Secondary | ICD-10-CM | POA: Insufficient documentation

## 2023-11-02 DIAGNOSIS — F32A Depression, unspecified: Secondary | ICD-10-CM | POA: Insufficient documentation

## 2023-11-02 DIAGNOSIS — Z79811 Long term (current) use of aromatase inhibitors: Secondary | ICD-10-CM | POA: Insufficient documentation

## 2023-11-02 MED ORDER — MELOXICAM 7.5 MG PO TABS: 7.5000 mg | ORAL_TABLET | Freq: Every day | ORAL | 1 refills | Status: AC

## 2023-11-02 NOTE — Progress Notes (Signed)
 [facility] Cancer Follow up:    Patient, No Pcp Per No address on file   DIAGNOSIS:  Cancer Staging  Malignant neoplasm of upper-outer quadrant of left breast in female, estrogen receptor positive (HCC) Staging form: Breast, AJCC 8th Edition - Clinical: Stage IIA (cT2, cN1, cM0, G2, ER+, PR+, HER2-) - Signed by Cameron Cea, MD on 12/05/2021 Stage prefix: Initial diagnosis Histologic grading system: 3 grade system - Clinical: Stage IV (cM1) - Signed by Percival Brace, NP on 01/16/2023    SUMMARY OF ONCOLOGIC HISTORY: Oncology History  Malignant neoplasm of upper-outer quadrant of left breast in female, estrogen receptor positive (HCC)  11/05/2021 Initial Diagnosis   Palpable lump in the left breast with the pain along with right-sided nipple discharge.  Mammogram reveals highly suspicious mass left breast 3 o'clock position 2.2 cm, 2 abnormal left axillary lymph nodes: Biopsy: Grade 2 IDC ER 100%, PR 0%, Ki-67 10%, HER2 2+ by IHC, FISH negative ratio 1.4, axillary lymph node biopsy: IDC, ER 100%, PR 20%, Ki-67 10%, HER2 negative ratio 1.31   11/28/2021 Breast MRI   Left breast cancer 2.7 cm and 0.7 cm linear enhancement UOQ left breast needs biopsy, 0.5 cm mass posterior to the right nipple needs biopsy, 2-lymph nodes left axilla, 1.5 cm sternal lesion   12/05/2021 Cancer Staging   Staging form: Breast, AJCC 8th Edition - Clinical: Stage IIA (cT2, cN1, cM0, G2, ER+, PR+, HER2-) - Signed by Cameron Cea, MD on 12/05/2021 Stage prefix: Initial diagnosis Histologic grading system: 3 grade system   01/15/2022 Pathology Results   SURGICAL PATHOLOGY  CASE: [id number]  PATIENT: Tracie Atkins  Surgical Pathology Report      Clinical History: left breast cancer (cm)   FINAL MICROSCOPIC DIAGNOSIS:   A. BREAST, LEFT, LUMPECTOMY:  Invasive ductal carcinoma with clear margins of resection.  Please see the synoptic report after specimen D.   B. BREAST, RIGHT,  LUMPECTOMY:  Fibrous scar with hemosiderin deposits.  Carcinoma is not identified in the resected specimen.   C. LYMPH NODE, LEFT AXILLARY #2, SENTINEL, EXCISION:  A lymph node negative for metastatic carcinoma.   D. 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   pTNM classification ( AJCC 8th Edition): pT2, pN1a  Results of prognostic markers performed on prior biopsy from 11/05/2021  ([id number])           ER: Positive in 100% of tumor cells.           PR: Negative.           Ki-67: Positive in 10% of tumor cells.           HER-2/neu: 2+ by IHC/ Negative by FISH.    02/17/2022 Oncotype testing   Oncotype of 11, no benefit from chemotherapy.   03/26/2022 - 04/11/2022 Radiation Therapy   9 fractions totaling 16.2 Gy prior to relocation to [place]    10/20/2022 PET scan   1. Multifocal hypermetabolic skeletal metastasis involving the sternum, T6 vertebral body, T5 and T7 posterior elements, and inferior RIGHT scapula. 2. Hypermetabolic LEFT internal mammary lymph node most consistent with metastatic adenopathy. 3. Postsurgical change in the RIGHT and LEFT breast. No residual carcinoma identified in the breasts or axilla by FDG PET imaging.     10/23/2022 - 11/05/2022 Radiation Therapy   Plan Name: Chest Site: Thorax including T5-7 and sternum Technique: 3D Mode: Photon Dose Per Fraction: 3 Gy Prescribed Dose (Delivered /  Prescribed): 30 Gy / 30 Gy Prescribed Fxs (Delivered / Prescribed): 10 / 10   11/05/2022 -  Anti-estrogen oral therapy   Anastrozole  daily   12/16/2022 Imaging   IMPRESSION: 1. Osseous metastases T3 through T7 levels as above. Mild chronic compression deformity of T3, possibly pathologic. No extra osseous or epidural extension of tumor. 2. Mild multilevel degenerative spondylosis as above. No significant stenosis or overt neural impingement.     01/16/2023 Cancer Staging   Staging form:  Breast, AJCC 8th Edition - Clinical: Stage IV (cM1) - Signed by Percival Brace, NP on 01/16/2023     CURRENT THERAPY: Anastrozole   INTERVAL HISTORY:  Discussed the use of AI scribe software for clinical note transcription with the patient, who gave verbal consent to proceed.  Tracie Atkins 68 y.o. female a patient with metastatic breast cancer on anastrozole , presents with worsening back pain for the past two weeks. The pain extends from the upper to lower back and is exacerbated by household chores such as sweeping and mopping. She reports feeling breathless and unable to walk due to the pain. She also reports numbness in her fingers and toes. She has been unable to manage the pain and has not been able to see her primary care doctor due to appointment availability. She denies taking any medication for the pain--however when questioned about the meloxicam  on her medication list she says that she did take to the other day to help with the pain.  She also expresses feelings of loneliness and despair, stating that she feels like giving up and returning to drinking. She denies any suicidal ideation. She is currently staying at her son's house but feels unsupported and scared. She is seeking help from a social worker to find a place where she can get help.  She also reports a new skin lesion which is painful to touch. She is concerned it might be a tick bite. She is currently taking anastrozole  for her breast cancer and denies any issues with taking this medication..   Patient Active Problem List   Diagnosis Date Noted   Metastasis to bone (HCC) 01/16/2023   Vitamin D deficiency 01/16/2023   Seasonal allergies 01/16/2023   Obesity (BMI 30.0-34.9) 01/16/2023   Hyperlipidemia 01/16/2023   Essential hypertension 01/16/2023   Alcohol use disorder, severe, dependence (HCC) 11/07/2022   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   Malignant neoplasm of upper-outer quadrant of left breast in female,  estrogen receptor positive (HCC) 12/05/2021   Severe episode of recurrent major depressive disorder, without psychotic features (HCC) 08/19/2021   Suicidal ideations    Alcohol abuse with alcohol-induced mood disorder (HCC) 11/20/2016    has no known allergies.  MEDICAL HISTORY: Past Medical History:  Diagnosis Date   Breast cancer (HCC) 2023   left   Chronic dental infection 8/189   multiple teeth removed and Post -op infection.   Depression    ETOH abuse    Fatty liver    Headache     SURGICAL HISTORY: Past Surgical History:  Procedure Laterality Date   BREAST LUMPECTOMY WITH RADIOACTIVE SEED AND SENTINEL LYMPH NODE BIOPSY Left 01/15/2022   Procedure: LEFT BREAST SEED LOCALIZED LUMPECTOMY, LEFT SEED TARGETED AXILLARY LYMPH NODE BIOPSY, LEFT SENTINEL LYMPH NODE BIOPSY;  Surgeon: Lockie Rima, MD;  Location: [facility] OR;  Service: General;  Laterality: Left;   DENTAL SURGERY     RADIOACTIVE SEED GUIDED EXCISIONAL BREAST BIOPSY Right 01/15/2022   Procedure: RIGHT BREAST SEED LOCALIZED EXCISIONAL BIOPSY;  Surgeon:  Lockie Rima, MD;  Location: [facility] OR;  Service: General;  Laterality: Right;   TUBAL LIGATION      SOCIAL HISTORY: Social History   Socioeconomic History   Marital status: Single    Spouse name: Not on file   Number of children: 3   Years of education: Not on file   Highest education level: Not on file  Occupational History   Not on file  Tobacco Use   Smoking status: Never    Passive exposure: Never   Smokeless tobacco: Never  Vaping Use   Vaping status: Never Used  Substance and Sexual Activity   Alcohol use: Yes    Alcohol/week: 1.0 standard drink of alcohol    Types: 1 Standard drinks or equivalent per week    Comment: 1 mixed (liquor) drink per week   Drug use: No   Sexual activity: Never  Other Topics Concern   Not on file  Social History Narrative   Not on file   Social Drivers of Health   Financial Resource Strain: Not on file  Food Insecurity:  Low Risk  (06/30/2023)   Received from [facility]   Hunger Vital Sign    Worried About Running Out of Food in the Last Year: Never true    Ran Out of Food in the Last Year: Never true  Transportation Needs: No Transportation Needs (06/30/2023)   Received from [facility]    In the past 12 months, has lack of reliable transportation kept you from medical appointments, meetings, work or from getting things needed for daily living? : No  Physical Activity: Not on file  Stress: Not on file  Social Connections: Not on file  Intimate Partner Violence: Not At Risk (10/16/2022)   Humiliation, Afraid, Rape, and Kick questionnaire    Fear of Current or Ex-Partner: No    Emotionally Abused: No    Physically Abused: No    Sexually Abused: No    FAMILY HISTORY: History reviewed. No pertinent family history.  Review of Systems  Constitutional:  Positive for fatigue. Negative for appetite change, chills, fever and unexpected weight change.  HENT:   Negative for hearing loss, lump/mass and trouble swallowing.   Eyes:  Negative for eye problems and icterus.  Respiratory:  Negative for chest tightness, cough and shortness of breath.   Cardiovascular:  Negative for chest pain, leg swelling and palpitations.  Gastrointestinal:  Negative for abdominal distention, abdominal pain, constipation, diarrhea, nausea and vomiting.  Endocrine: Negative for hot flashes.  Genitourinary:  Negative for difficulty urinating.   Musculoskeletal:  Positive for back pain. Negative for arthralgias.  Skin:  Negative for itching and rash.  Neurological:  Negative for dizziness, extremity weakness, headaches and numbness.  Hematological:  Negative for adenopathy. Does not bruise/bleed easily.  Psychiatric/Behavioral:  Positive for depression. Negative for sleep disturbance and suicidal ideas. The patient is not nervous/anxious.       PHYSICAL EXAMINATION    Vitals:   11/02/23 1336  BP: 131/72   Pulse: (!) 106  Resp: 18  Temp: 98.4 F (36.9 C)  SpO2: 100%    Physical Exam Constitutional:      General: She is not in acute distress.    Appearance: Normal appearance. She is not toxic-appearing.  HENT:     Head: Normocephalic and atraumatic.     Mouth/Throat:     Mouth: Mucous membranes are moist.     Pharynx: Oropharynx is clear. No oropharyngeal exudate or posterior oropharyngeal  erythema.  Eyes:     General: No scleral icterus. Cardiovascular:     Rate and Rhythm: Normal rate and regular rhythm.     Pulses: Normal pulses.     Heart sounds: Normal heart sounds.  Pulmonary:     Effort: Pulmonary effort is normal.     Breath sounds: Normal breath sounds.  Abdominal:     General: Abdomen is flat. Bowel sounds are normal. There is no distension.     Palpations: Abdomen is soft.     Tenderness: There is no abdominal tenderness.  Musculoskeletal:        General: No swelling.     Cervical back: Neck supple.  Lymphadenopathy:     Cervical: No cervical adenopathy.  Skin:    General: Skin is warm and dry.     Findings: No rash.  Neurological:     General: No focal deficit present.     Mental Status: She is alert.  Psychiatric:        Mood and Affect: Mood normal.        Behavior: Behavior normal.     LABORATORY DATA:  CBC    Component Value Date/Time   WBC 5.2 10/02/2023 0829   RBC 4.34 10/02/2023 0829   HGB 11.1 (L) 10/02/2023 0829   HCT 35.3 (L) 10/02/2023 0829   PLT 249 10/02/2023 0829   MCV 81.3 10/02/2023 0829   MCH 25.6 (L) 10/02/2023 0829   MCHC 31.4 10/02/2023 0829   RDW 14.6 10/02/2023 0829   LYMPHSABS 0.9 10/02/2023 0829   MONOABS 0.7 10/02/2023 0829   EOSABS 0.1 10/02/2023 0829   BASOSABS 0.0 10/02/2023 0829    CMP     Component Value Date/Time   NA 139 10/02/2023 0829   K 3.8 10/02/2023 0829   CL 107 10/02/2023 0829   CO2 26 10/02/2023 0829   GLUCOSE 90 10/02/2023 0829   BUN 16 10/02/2023 0829   CREATININE 0.62 10/02/2023 0829    CREATININE 0.68 07/27/2023 1103   CALCIUM 8.9 10/02/2023 0829   PROT 6.5 10/02/2023 0829   ALBUMIN 3.8 10/02/2023 0829   AST 16 10/02/2023 0829   AST 15 07/27/2023 1103   ALT 16 10/02/2023 0829   ALT 12 07/27/2023 1103   ALKPHOS 134 (H) 10/02/2023 0829   BILITOT 0.3 10/02/2023 0829   BILITOT 0.3 07/27/2023 1103   GFRNONAA >60 10/02/2023 0829   GFRNONAA >60 07/27/2023 1103   GFRAA >60 06/12/2018 1323     ASSESSMENT and THERAPY PLAN:   Malignant neoplasm of upper-outer quadrant of left breast in female, estrogen receptor positive (HCC) Metastatic breast cancer Under treatment with anastrozole . No progression on last imaging in 08/2023. Back pain and numbness warrant further investigation for possible progression or new lesions. - Continue anastrozole . - Order x-ray of the back. - Consider MRI based on xray results  Back pain Back pain for two weeks, possibly due to disc disease or metastatic involvement. Last MRI showed disc disease and bulging discs. - Prescribe meloxicam , one tablet daily with food.  Numbness in fingers and toes Numbness may be due to peripheral neuropathy or spinal involvement. No immediate evidence of progression on imaging. - Evaluate x-ray results for spinal involvement. - Consider neurosurgery evaluation if symptoms persist.  Depression Reports of social isolation and not taking Lexapro . No suicidal ideation. Social and housing issues contribute to distress. - Refer to social work for support with social and housing issues. - Recommended she work on getting back into psychiatry for  evaluation.   RTC in 8 weeks for labs and f/u with Dr. Arno Bibles  All questions were answered. The patient knows to call the clinic with any problems, questions or concerns. We can certainly see the patient much sooner if necessary.  Total encounter time:20 minutes*in face-to-face visit time, chart review, lab review, care coordination, order entry, and documentation of the  encounter time.    Alwin Baars, NP 11/02/23 3:42 PM Medical Oncology and Hematology [facility] [address] [place], [place] [address] Tel. [phone]    Fax. [phone]  *Total Encounter Time as defined by the Centers for Medicare and Medicaid Services includes, in addition to the face-to-face time of a patient visit (documented in the note above) non-face-to-face time: obtaining and reviewing outside history, ordering and reviewing medications, tests or procedures, care coordination (communications with other health care professionals or caregivers) and documentation in the medical record.

## 2023-11-02 NOTE — Assessment & Plan Note (Signed)
 Metastatic breast cancer Under treatment with anastrozole . No progression on last imaging in 08/2023. Back pain and numbness warrant further investigation for possible progression or new lesions. - Continue anastrozole . - Order x-ray of the back. - Consider MRI based on xray results  Back pain Back pain for two weeks, possibly due to disc disease or metastatic involvement. Last MRI showed disc disease and bulging discs. - Prescribe meloxicam , one tablet daily with food.  Numbness in fingers and toes Numbness may be due to peripheral neuropathy or spinal involvement. No immediate evidence of progression on imaging. - Evaluate x-ray results for spinal involvement. - Consider neurosurgery evaluation if symptoms persist.  Depression Reports of social isolation and not taking Lexapro . No suicidal ideation. Social and housing issues contribute to distress. - Refer to social work for support with social and housing issues. - Recommended she work on getting back into psychiatry for evaluation.   RTC in 8 weeks for labs and f/u with Dr. Arno Bibles

## 2023-11-02 NOTE — Patient Instructions (Signed)
 Slipped, Ruptured, or Bulging Disk in the Spine (Herniated Disk): What to Know  A herniated disk happens when a disk in the spine bulges out too far. There is an oval disk between each pair of bones (vertebrae) in the backbone or spine. The disks connect the bones, help the spine move, and keep the bones from rubbing against each other when you move. A herniated disk can happen anywhere in the back or neck area. It most often affects the lower back. What are the causes? Wear and tear as you age. Sudden injury, such as a strain or sprain. What increases the risk? Age. The older you are the higher the risk. Being a female who is 4-9 years old. Doing activities that involve heavy lifting, bending, or twisting. Not getting enough exercise. Being overweight. Smoking or using tobacco. What are the signs or symptoms? Sharp pain in the arm, hip, butt, or in the lower back. The pain can spread to the leg and foot. Dizziness. A feeling that things are moving when they are not (vertigo). Pain or weakness in the neck, shoulder, upper or lower arm, or fingers. Muscle weakness. You may not be able to: Lift your arm or leg. Stand on your toes. Squeeze with your hands. Loss of feeling (numbness) or tingling in the hands, arms, feet, or legs. Being unable to control when to poop or pee. This is rare but serious. How is this treated? This condition may be treated with: Resting for a few days or several weeks. Do not do things that cause pain. Do not go into complete bed rest. Do only light activities. If the injury is in your lower back, limit how much you sit. Sitting puts more pressure on the disk. Medicines for pain, swelling, or tense muscles. Ice or heat therapy. Steroid shots. These can reduce pain and swelling. Physical therapy to strengthen your back. Follow these instructions at home: Medicines Take your medicines only as told. You may need to take steps to help treat or prevent trouble  pooping (constipation), such as: Taking medicines to help you poop. Eating foods high in fiber, like beans, whole grains, and fresh fruits and vegetables. Drinking more fluids as told. Ask your doctor if it's safe to drive or use machines while taking your medicine. Managing pain, stiffness, and swelling     Use ice or an ice pack as told. Place a towel between your skin and the ice. Leave the ice on for 20 minutes, 2-3 times a day. Use heat as told. Use the heat source that your doctor recommends, such as a moist heat pack or a heating pad. Do this as often as told. Place a towel between your skin and the heat source. Leave the heat on for 20-30 minutes. If your skin turns red, take off the ice or heat right away to prevent skin damage. The risk of damage is higher if you can't feel pain, heat, or cold. Activity Rest as told by your doctor. Avoid strict bed rest. Do only light activities that cause no pain. Use good posture. Avoid movements that cause pain. Do not lift anything heavier than 10 lb (4.5 kg) until you're told it's OK. Do not stand for a long time without changing positions. Do not sit for long periods of time without getting up and moving around. Exercise as told. Strengthen muscles in your back and belly with exercises, such as swimming or walking. Slowly return to your normal activities and exercise. Ask what things are  safe for you to do at home. Ask when you can go back to work or school. General instructions Do not smoke, vape, or use nicotine or tobacco. Doing this can slow down healing. Do not wear high-heeled shoes. Do not sleep on your belly. If you're overweight, work with your doctor to lose weight safely. Keep all follow-up visits. Your doctor needs to make sure that your treatment is working. In some cases, the doctor may change your treatment plan. How is this prevented? Stay at a healthy weight. Stay in shape. Do at least 150 minutes of moderate  exercise each week, such as fast walking or water aerobics. When lifting objects: Keep your feet as far apart as your shoulders or farther apart. Tighten the muscles in your belly. Bend your knees and hips, and keep your spine neutral. Lift using the strength of your legs, not your back. Do not lock your knees straight out. Always ask for help to lift heavy or large objects. Contact a doctor if: You have back pain or neck pain that doesn't get better after 6 weeks. You have very bad pain in your back, neck, legs, or arms. You get any of these problems in any part of your body: Tingling. Weakness. Loss of feeling. Get help right away if: You can't move your arms or legs. You can't control when you pee or poop. You feel dizzy. You faint. You have trouble breathing. These symptoms may be an emergency. Call 911 right away. Do not wait to see if the symptoms will go away. Do not drive yourself to the hospital. This information is not intended to replace advice given to you by your health care provider. Make sure you discuss any questions you have with your health care provider. Document Revised: 05/22/2023 Document Reviewed: 03/20/2023 Elsevier Patient Education  2024 [employer].

## 2023-11-03 ENCOUNTER — Telehealth: Payer: Self-pay | Admitting: Hematology and Oncology

## 2023-11-03 ENCOUNTER — Other Ambulatory Visit: Payer: Self-pay

## 2023-11-03 ENCOUNTER — Inpatient Hospital Stay: Admitting: Licensed Clinical Social Worker

## 2023-11-03 NOTE — Progress Notes (Signed)
 [facility] Clinical Social Work  Clinical Social Work was referred by medical provider, Stefani Edin, for assessment of psychosocial needs.  Clinical Social Worker contacted patient by phone to offer support and assess for needs.    Pt reports that she currently lives in her son's home but he is not really there. She is feeling less safe being alone as she is having more pain and therefore difficulty with ADLs.  CSW reviewed various options including referral for PCS through Medicaid for in-home aide or referral to [facility] to increase in-home and social support.  Pt is more interested in potentially moving to a longer-term care/ assisted living home.  Pt agreed to a referral to [facility] to further explore those options. Referral placed today.  CSW provided direct contact information to pt and encouraged her to call back if the options through [facility] do not fit her needs.   Domani Bakos E Dolores Ewing, LCSW  Clinical Social Worker [facility]

## 2023-11-03 NOTE — Telephone Encounter (Signed)
 Confirmed with pt scheduled appt date and time

## 2023-11-04 ENCOUNTER — Encounter (HOSPITAL_COMMUNITY): Payer: Self-pay | Admitting: Licensed Clinical Social Worker

## 2023-11-04 ENCOUNTER — Ambulatory Visit (HOSPITAL_COMMUNITY): Admitting: Licensed Clinical Social Worker

## 2023-11-04 DIAGNOSIS — F332 Major depressive disorder, recurrent severe without psychotic features: Secondary | ICD-10-CM | POA: Diagnosis not present

## 2023-11-04 DIAGNOSIS — F1020 Alcohol dependence, uncomplicated: Secondary | ICD-10-CM
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
## 2023-11-09 ENCOUNTER — Ambulatory Visit (HOSPITAL_COMMUNITY): Admission: RE | Admit: 2023-11-09 | Source: Ambulatory Visit

## 2023-11-13 ENCOUNTER — Telehealth: Payer: Self-pay | Admitting: *Deleted

## 2023-11-13 NOTE — Telephone Encounter (Signed)
 PC to patient, informed her MD appointment for 11/16/23 needs to be rescheduled until after her MRI on the 13th.  May 12 appointment canceled, informed patient our scheduling department will contact her to reschedule.  Patient verbalizes understanding, scheduling message already sent on 11/12/23.

## 2023-11-16 ENCOUNTER — Inpatient Hospital Stay

## 2023-11-16 ENCOUNTER — Telehealth: Payer: Self-pay | Admitting: Internal Medicine

## 2023-11-16 ENCOUNTER — Inpatient Hospital Stay: Payer: 59 | Admitting: Internal Medicine

## 2023-11-16 NOTE — Telephone Encounter (Signed)
 Chara scheduled her appointments and she is aware of all appointment details.

## 2023-11-17 ENCOUNTER — Ambulatory Visit (HOSPITAL_COMMUNITY): Admission: RE | Admit: 2023-11-17 | Source: Ambulatory Visit

## 2023-11-17 ENCOUNTER — Ambulatory Visit (HOSPITAL_COMMUNITY)

## 2023-11-17 ENCOUNTER — Inpatient Hospital Stay: Attending: Internal Medicine

## 2023-11-17 ENCOUNTER — Other Ambulatory Visit: Payer: Self-pay | Admitting: *Deleted

## 2023-11-17 DIAGNOSIS — E236 Other disorders of pituitary gland: Secondary | ICD-10-CM

## 2023-11-18 ENCOUNTER — Telehealth: Payer: Self-pay | Admitting: *Deleted

## 2023-11-18 NOTE — Telephone Encounter (Signed)
 PC to patient, informed her we need to change her appointment with Dr Mark Sil on May 19 because her MRI is scheduled on May 22.  Appointment changed to 12/01/23 at 9:00.  Patient verbalizes understanding.

## 2023-11-19 ENCOUNTER — Other Ambulatory Visit (HOSPITAL_COMMUNITY)

## 2023-11-20 ENCOUNTER — Other Ambulatory Visit: Payer: Self-pay | Admitting: Family Medicine

## 2023-11-20 DIAGNOSIS — N63 Unspecified lump in unspecified breast: Secondary | ICD-10-CM

## 2023-11-23 ENCOUNTER — Ambulatory Visit: Admitting: Internal Medicine

## 2023-11-26 ENCOUNTER — Other Ambulatory Visit (HOSPITAL_COMMUNITY): Payer: Self-pay

## 2023-11-26 ENCOUNTER — Other Ambulatory Visit: Payer: Self-pay

## 2023-11-26 ENCOUNTER — Ambulatory Visit (HOSPITAL_COMMUNITY)
Admission: RE | Admit: 2023-11-26 | Discharge: 2023-11-26 | Disposition: A | Discharge status: Home / Self Care | Source: Ambulatory Visit | Attending: Adult Health | Admitting: Adult Health

## 2023-11-26 ENCOUNTER — Inpatient Hospital Stay

## 2023-11-26 ENCOUNTER — Ambulatory Visit (HOSPITAL_COMMUNITY)
Admission: RE | Admit: 2023-11-26 | Discharge: 2023-11-26 | Disposition: A | Discharge status: Home / Self Care | Source: Ambulatory Visit | Attending: Internal Medicine | Admitting: Internal Medicine

## 2023-11-26 DIAGNOSIS — C7951 Secondary malignant neoplasm of bone: Secondary | ICD-10-CM

## 2023-11-26 DIAGNOSIS — E236 Other disorders of pituitary gland: Secondary | ICD-10-CM | POA: Insufficient documentation

## 2023-11-26 DIAGNOSIS — C50412 Malignant neoplasm of upper-outer quadrant of left female breast: Secondary | ICD-10-CM

## 2023-11-26 DIAGNOSIS — Z170 Estrogen receptor positive status [ER+]: Secondary | ICD-10-CM | POA: Diagnosis present

## 2023-11-26 MED ORDER — GADOBUTROL 1 MMOL/ML IV SOLN
9.0000 mL | Freq: Once | INTRAVENOUS | Status: AC | PRN
  Administered 2023-11-26: 9 mL via INTRAVENOUS

## 2023-12-01 ENCOUNTER — Inpatient Hospital Stay (HOSPITAL_BASED_OUTPATIENT_CLINIC_OR_DEPARTMENT_OTHER): Admitting: Internal Medicine

## 2023-12-01 ENCOUNTER — Other Ambulatory Visit: Payer: Self-pay | Admitting: *Deleted

## 2023-12-01 ENCOUNTER — Ambulatory Visit: Payer: Self-pay | Admitting: *Deleted

## 2023-12-01 ENCOUNTER — Telehealth: Payer: Self-pay | Admitting: *Deleted

## 2023-12-01 ENCOUNTER — Other Ambulatory Visit: Payer: Self-pay

## 2023-12-01 DIAGNOSIS — E236 Other disorders of pituitary gland: Secondary | ICD-10-CM

## 2023-12-01 MED ORDER — ANASTROZOLE 1 MG PO TABS: 1.0000 mg | ORAL_TABLET | Freq: Every day | ORAL | 3 refills | Status: DC

## 2023-12-01 NOTE — Telephone Encounter (Signed)
-----   Message from Conway Dennis sent at 11/27/2023  4:03 PM EDT ----- Please call patient with results.  Cancer in the spine is better.  In her lower spine there is an area where the disc is protruding (not related to cancer).  Will wait to see if Mark Sil has a recommendation next week.  Will you ask how her pain/discomfort is? ----- Message ----- From: Interface, Rad Results In Sent: 11/26/2023   6:55 PM EDT To: Percival Brace, NP

## 2023-12-01 NOTE — Progress Notes (Signed)
 I connected with Tracie Atkins on 12/01/23 at  9:00 AM EDT by telephone visit and verified that I am speaking with the correct person using two identifiers.  I discussed the limitations, risks, security and privacy concerns of performing an evaluation and management service by telemedicine and the availability of in-person appointments. I also discussed with the patient that there may be a patient responsible charge related to this service. The patient expressed understanding and agreed to proceed.  Other persons participating in the visit and their role in the encounter:  n/a   Patient's location:  Home Provider's location:  Office Chief Complaint:  Pituitary mass [facility])  History of Present Ilness: Tracie Atkins reports no clinical changes today.  No seizures, headaches.  Gait remains independent.  Observations: Language and cognition at baseline  Imaging:  [facility] Clinician Interpretation: I have personally reviewed the CNS images as listed.  My interpretation, in the context of the patient's clinical presentation, is stable disease  MR Lumbar Spine W Wo Contrast Result Date: 11/26/2023 CLINICAL DATA:  Metastatic disease evaluation. Spine metastases, lumbar, monitor. History of metastatic breast cancer. EXAM: MRI LUMBAR SPINE WITHOUT AND WITH CONTRAST TECHNIQUE: Multiplanar and multiecho pulse sequences of the lumbar spine were obtained without and with intravenous contrast. CONTRAST:  9mL GADAVIST  GADOBUTROL  1 MMOL/ML IV SOLN COMPARISON:  MRI lumbar spine 12/16/2022. CT chest, abdomen, and pelvis 08/10/2023. FINDINGS: Segmentation: Transitional lumbosacral anatomy with sacralized L5 and rudimentary disc at L5-S1. The ribs at T12 are small. Note that this numbering differs from that used on the prior lumbar MRI. Alignment: Unchanged trace anterolisthesis L3 on L4 and trace retrolisthesis of L4 on L5. Vertebrae: No acute fracture or suspicious marrow lesion. Unchanged mild chronic L2 compression  fracture. Minimal edema along the L2 inferior endplate, likely degenerative. Conus medullaris and cauda equina: Conus extends to the T12 level. Conus and cauda equina appear normal. Paraspinal and other soft tissues: Unchanged 1.6 cm left upper pole renal cyst containing a small amount of layering hemorrhage or proteinaceous material and for which no follow-up imaging is recommended. Disc levels: Disc desiccation throughout the lumbar spine with moderate to severe disc space narrowing at L4-5 and mild narrowing elsewhere. T12-L1: Spondylosis with anterior vertebral spurring. No disc herniation or stenosis. L1-2: Disc bulging, endplate spurring, and mild facet hypertrophy without significant stenosis, unchanged. Unchanged right extraforaminal disc protrusion. L2-3: Disc bulging, a right foraminal to extraforaminal disc protrusion, and mild facet hypertrophy without significant stenosis. The right-sided disc protrusion has mildly enlarged and could affect the right L2 nerve root in the extraforaminal region. L3-4: Anterolisthesis with mild bulging of uncovered disc, a small right subarticular disc protrusion, and moderate facet hypertrophy result in minimal to mild bilateral neural foraminal and right lateral recess stenosis without spinal stenosis, unchanged. L4-5: Disc bulging, disc space height loss, and mild facet hypertrophy result in minimal to mild bilateral neural foraminal stenosis, unchanged. There is an unchanged broad central disc protrusion without significant spinal or lateral recess stenosis. IMPRESSION: 1. No evidence of metastatic disease in the lumbar spine. 2. Mildly enlarged right-sided disc protrusion at L2-3 which could affect the right L2 nerve root in the extraforaminal region. 3. Otherwise unchanged lumbar disc and facet degeneration without high-grade stenosis. Electronically Signed   By: Aundra Lee M.D.   On: 11/26/2023 19:13   MR Brain W Wo Contrast Result Date: 11/26/2023 CLINICAL  DATA:  Brain/CNS neoplasm, monitor. Pituitary mass. History of breast cancer. EXAM: MRI HEAD WITHOUT AND WITH CONTRAST  TECHNIQUE: Multiplanar, multiecho pulse sequences of the brain and surrounding structures were obtained without and with intravenous contrast. CONTRAST:  9mL GADAVIST  GADOBUTROL  1 MMOL/ML IV SOLN COMPARISON:  Head CT 05/29/2023 and MRI 11/13/2022 FINDINGS: Brain: There is no evidence of an acute infarct, intracranial hemorrhage, midline shift, or extra-axial fluid collection. Patchy to confluent T2 hyperintensities in the cerebral white matter bilaterally are unchanged from the prior MRI and are nonspecific but compatible with extensive chronic small vessel ischemic disease. Cerebral volume is within normal limits for age. The ventricles are normal in size. A 7 mm homogeneously enhancing extra-axial dural-based mass over the high left frontal convexity is unchanged from the prior MRI (series 20 5 inch 142), and there is no associated mass effect or brain edema. Dedicated imaging was performed through the sella turcica. The pituitary gland is normal in size and enhances homogeneously without a discrete mass identified. The lesion questioned in the far left lateral aspect of the gland on a 2023 MRI may have been artifactual and related to volume averaging through adjacent vascular structures and bone. The infundibulum is midline. The optic chiasm is unremarkable. Vascular: Major intracranial vascular flow voids are preserved. Skull and upper cervical spine: Unremarkable bone marrow signal. Sinuses/Orbits: Bilateral cataract extraction. Paranasal sinuses and mastoid air cells are clear. Other: None. IMPRESSION: 1. No evidence of a pituitary mass. 2. Unchanged 7 mm left frontal meningioma. 3. Extensive chronic small vessel ischemic disease. Electronically Signed   By: Aundra Lee M.D.   On: 11/26/2023 19:03   MR THORACIC SPINE W WO CONTRAST Result Date: 11/26/2023 CLINICAL DATA:  Metastatic disease  evaluation. Spine metastases, thoracic, monitor. Breast cancer. EXAM: MRI THORACIC WITHOUT AND WITH CONTRAST TECHNIQUE: Multiplanar and multiecho pulse sequences of the thoracic spine were obtained without and with intravenous contrast. CONTRAST:  9mL GADAVIST  GADOBUTROL  1 MMOL/ML IV SOLN COMPARISON:  MRI thoracic spine 12/16/2023 12/16/2022. CT chest, abdomen, and pelvis 08/10/2023. FINDINGS: Alignment: Mild thoracic dextroscoliosis. No significant listhesis. Vertebrae: Post radiation changes in the upper to midthoracic spine. Osseous metastases involving the T3-T7 vertebrae, overall improved from the 2024 thoracic spine MRI. No evidence of new metastases or interval fracture. Unchanged mild chronic T3 compression fracture. Cord:  Normal signal and morphology.  No abnormal enhancement. Paraspinal and other soft tissues: Large hiatal hernia. Disc levels: Similar appearance of mild multilevel disc degeneration and facet arthrosis compared to the prior MRI without significant stenosis. Unchanged slight ventral cord flattening at T2-3 due to a small central disc protrusion. IMPRESSION: Improved metastases involving the T3-T7 vertebrae since 12/16/2022. No evidence of new or progressive disease in the thoracic spine. Electronically Signed   By: Aundra Lee M.D.   On: 11/26/2023 18:52   DG Lumbar Spine 2-3 Views Result Date: 11/02/2023 CLINICAL DATA:  back pain, breast cancer, evaluate EXAM: LUMBAR SPINE - 2-3 VIEW COMPARISON:  None Available. FINDINGS: There are 5 nonrib-bearing lumbar vertebrae. Anatomic lumbar curvature. There is grade 1 anterolisthesis of L4 over L5 and grade 1 retrolisthesis of L3 over L4, most likely degenerative. Vertebral body heights are maintained. No aggressive osseous lesion. Moderate multilevel degenerative changes in the form of reduced intervertebral disc height, endplate sclerosis/irregularity, facet arthropathy and marginal osteophyte formation. Sacroiliac joints are symmetric.  Visualized soft tissues are within normal limits. IMPRESSION: *No acute osseous abnormality of the lumbar spine. No aggressive osseous lesion. Moderate multilevel degenerative changes. Electronically Signed   By: Beula Brunswick M.D.   On: 11/02/2023 15:20   DG Thoracic Spine 2 View  Result Date: 11/02/2023 CLINICAL DATA:  back pain, breast cancer. EXAM: THORACIC SPINE 2 VIEWS COMPARISON:  None Available. FINDINGS: Unremarkable spinal curvature. No spondylolisthesis. Vertebral body heights are maintained. No aggressive osseous lesion. Moderate-severe multilevel degenerative changes in the form of reduced intervertebral disc height, endplate sclerosis/irregularity, facet arthropathy and marginal osteophyte formation. Visualized soft tissues are within normal limits. IMPRESSION: No acute osseous abnormality of the thoracic spine. Moderate-severe multilevel degenerative changes. Electronically Signed   By: Beula Brunswick M.D.   On: 11/02/2023 15:19   Assessment and Plan: Pituitary mass (HCC)  Clinically stable from neurologic standpoint.  MRI brain demonstrates apparent resolution of previously visualized pituitary lesion.  Small meningioma left frontal is unchanged in size over 18 months.  Follow Up Instructions: Recommended follow up as needed with neurologic symptoms.    I discussed the assessment and treatment plan with the patient.  The patient was provided an opportunity to ask questions and all were answered.  The patient agreed with the plan and demonstrated understanding of the instructions.    The patient was advised to call back or seek an in-person evaluation if the symptoms worsen or if the condition fails to improve as anticipated.    Dannel Rafter K Odis Wickey, MD   I provided 20 minutes of non face-to-face telephone visit time during this encounter, and > 50% was spent counseling as documented under my assessment & plan.

## 2023-12-01 NOTE — Telephone Encounter (Signed)
 Contacted patient @ [phone] as she had not arrived for 0900 appt with Dr. Mark Sil. Per patient, her provided transportation did not arrive. She called them, but they did not come. Dr. Mark Sil informed of patient's lack of transportation and stated he will call patient to discuss MRI results.

## 2023-12-01 NOTE — Telephone Encounter (Signed)
 Per Alwin Baars, DNP, called pt with message below. Pt states that she will be out of town from 6/25 until after July 30th and would like to reschedule appt with Dr. Arno Bibles. Made providers nurse aware. Pt verbalized understanding.

## 2023-12-03 ENCOUNTER — Other Ambulatory Visit: Payer: Self-pay

## 2023-12-03 ENCOUNTER — Other Ambulatory Visit: Payer: Self-pay | Admitting: Pharmacist

## 2023-12-03 NOTE — Progress Notes (Signed)
 Patient did not initiate treatment with [facility] . Disenrolled.

## 2024-01-04 ENCOUNTER — Ambulatory Visit: Admitting: Hematology and Oncology

## 2024-01-04 ENCOUNTER — Other Ambulatory Visit

## 2024-01-06 ENCOUNTER — Other Ambulatory Visit (HOSPITAL_BASED_OUTPATIENT_CLINIC_OR_DEPARTMENT_OTHER): Payer: Self-pay | Admitting: Nurse Practitioner

## 2024-01-06 DIAGNOSIS — E2839 Other primary ovarian failure: Secondary | ICD-10-CM

## 2024-02-03 ENCOUNTER — Telehealth: Payer: Self-pay

## 2024-02-03 NOTE — Telephone Encounter (Signed)
 Pt verbally confirmed appt for 7/31

## 2024-02-04 ENCOUNTER — Inpatient Hospital Stay: Attending: Internal Medicine

## 2024-02-04 ENCOUNTER — Telehealth: Payer: Self-pay | Admitting: *Deleted

## 2024-02-04 ENCOUNTER — Inpatient Hospital Stay (HOSPITAL_BASED_OUTPATIENT_CLINIC_OR_DEPARTMENT_OTHER): Admitting: Hematology and Oncology

## 2024-02-04 VITALS — BP 135/83 | HR 98 | Temp 98.1°F | Resp 17 | Wt 190.8 lb

## 2024-02-04 DIAGNOSIS — Z79811 Long term (current) use of aromatase inhibitors: Secondary | ICD-10-CM | POA: Diagnosis not present

## 2024-02-04 DIAGNOSIS — Z170 Estrogen receptor positive status [ER+]: Secondary | ICD-10-CM

## 2024-02-04 DIAGNOSIS — F32A Depression, unspecified: Secondary | ICD-10-CM | POA: Insufficient documentation

## 2024-02-04 DIAGNOSIS — C50412 Malignant neoplasm of upper-outer quadrant of left female breast: Secondary | ICD-10-CM

## 2024-02-04 DIAGNOSIS — Z1721 Progesterone receptor positive status: Secondary | ICD-10-CM | POA: Diagnosis not present

## 2024-02-04 DIAGNOSIS — Z1732 Human epidermal growth factor receptor 2 negative status: Secondary | ICD-10-CM | POA: Diagnosis not present

## 2024-02-04 DIAGNOSIS — M545 Low back pain, unspecified: Secondary | ICD-10-CM | POA: Diagnosis not present

## 2024-02-04 DIAGNOSIS — C7951 Secondary malignant neoplasm of bone: Secondary | ICD-10-CM | POA: Insufficient documentation

## 2024-02-04 LAB — CBC WITH DIFFERENTIAL/PLATELET
Abs Immature Granulocytes: 0.03 K/uL (ref 0.00–0.07)
Basophils Absolute: 0 K/uL (ref 0.0–0.1)
Basophils Relative: 1 %
Eosinophils Absolute: 0.1 K/uL (ref 0.0–0.5)
Eosinophils Relative: 1 %
HCT: 33.5 % — ABNORMAL LOW (ref 36.0–46.0)
Hemoglobin: 10.4 g/dL — ABNORMAL LOW (ref 12.0–15.0)
Immature Granulocytes: 1 %
Lymphocytes Relative: 14 %
Lymphs Abs: 0.9 K/uL (ref 0.7–4.0)
MCH: 24.1 pg — ABNORMAL LOW (ref 26.0–34.0)
MCHC: 31 g/dL (ref 30.0–36.0)
MCV: 77.7 fL — ABNORMAL LOW (ref 80.0–100.0)
Monocytes Absolute: 0.7 K/uL (ref 0.1–1.0)
Monocytes Relative: 11 %
Neutro Abs: 4.6 K/uL (ref 1.7–7.7)
Neutrophils Relative %: 72 %
Platelets: 223 K/uL (ref 150–400)
RBC: 4.31 MIL/uL (ref 3.87–5.11)
RDW: 17.3 % — ABNORMAL HIGH (ref 11.5–15.5)
WBC: 6.3 K/uL (ref 4.0–10.5)
nRBC: 0 % (ref 0.0–0.2)

## 2024-02-04 MED ORDER — ANASTROZOLE 1 MG PO TABS: 1.0000 mg | ORAL_TABLET | Freq: Every day | ORAL | 3 refills | Status: DC

## 2024-02-04 MED ORDER — ESCITALOPRAM OXALATE 20 MG PO TABS: 20.0000 mg | ORAL_TABLET | Freq: Every day | ORAL | 1 refills | Status: DC

## 2024-02-04 NOTE — Assessment & Plan Note (Addendum)
 Metastatic breast cancer Under treatment with anastrozole . No progression on last imaging in 08/2023. Back pain and numbness warrant further investigation for possible progression or new lesions. - Continue anastrozole . - Repeat imaging in August or early September. - Tumor markers of no yield, they were neg last yr.  Depression Continue lexapro . Refilled today, she is having transportation issues to go to psych follow up. No suicidal ideation. Social and housing issues contribute to distress. - Refer to social work for support with social and housing issues. - Continue follow up with psychiatry.   RTC in 8 weeks for labs and f/u with Dr. Loretha

## 2024-02-04 NOTE — Progress Notes (Signed)
 [facility] Cancer Follow up:    Patient, No Pcp Per No address on file   DIAGNOSIS:  Cancer Staging  Malignant neoplasm of upper-outer quadrant of left breast in female, estrogen receptor positive (HCC) Staging form: Breast, AJCC 8th Edition - Clinical: Stage IIA (cT2, cN1, cM0, G2, ER+, PR+, HER2-) - Signed by Odean Potts, MD on 12/05/2021 Stage prefix: Initial diagnosis Histologic grading system: 3 grade system - Clinical: Stage IV (cM1) - Signed by Crawford Morna Pickle, NP on 01/16/2023   SUMMARY OF ONCOLOGIC HISTORY: Oncology History  Malignant neoplasm of upper-outer quadrant of left breast in female, estrogen receptor positive (HCC)  11/05/2021 Initial Diagnosis   Palpable lump in the left breast with the pain along with right-sided nipple discharge.  Mammogram reveals highly suspicious mass left breast 3 o'clock position 2.2 cm, 2 abnormal left axillary lymph nodes: Biopsy: Grade 2 IDC ER 100%, PR 0%, Ki-67 10%, HER2 2+ by IHC, FISH negative ratio 1.4, axillary lymph node biopsy: IDC, ER 100%, PR 20%, Ki-67 10%, HER2 negative ratio 1.31   11/28/2021 Breast MRI   Left breast cancer 2.7 cm and 0.7 cm linear enhancement UOQ left breast needs biopsy, 0.5 cm mass posterior to the right nipple needs biopsy, 2-lymph nodes left axilla, 1.5 cm sternal lesion   12/05/2021 Cancer Staging   Staging form: Breast, AJCC 8th Edition - Clinical: Stage IIA (cT2, cN1, cM0, G2, ER+, PR+, HER2-) - Signed by Odean Potts, MD on 12/05/2021 Stage prefix: Initial diagnosis Histologic grading system: 3 grade system   01/15/2022 Pathology Results   SURGICAL PATHOLOGY  CASE: [id number]  PATIENT: Tracie Atkins  Surgical Pathology Report      Clinical History: left breast cancer (cm)   FINAL MICROSCOPIC DIAGNOSIS:   A. BREAST, LEFT, LUMPECTOMY:  Invasive ductal carcinoma with clear margins of resection.  Please see the synoptic report after specimen D.   B. BREAST, RIGHT,  LUMPECTOMY:  Fibrous scar with hemosiderin deposits.  Carcinoma is not identified in the resected specimen.   C. LYMPH NODE, LEFT AXILLARY #2, SENTINEL, EXCISION:  A lymph node negative for metastatic carcinoma.   D. 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   pTNM classification ( AJCC 8th Edition): pT2, pN1a  Results of prognostic markers performed on prior biopsy from 11/05/2021  ([id number])           ER: Positive in 100% of tumor cells.           PR: Negative.           Ki-67: Positive in 10% of tumor cells.           HER-2/neu: 2+ by IHC/ Negative by FISH.    02/17/2022 Oncotype testing   Oncotype of 11, no benefit from chemotherapy.   03/26/2022 - 04/11/2022 Radiation Therapy   9 fractions totaling 16.2 Gy prior to relocation to [place]    10/20/2022 PET scan   1. Multifocal hypermetabolic skeletal metastasis involving the sternum, T6 vertebral body, T5 and T7 posterior elements, and inferior RIGHT scapula. 2. Hypermetabolic LEFT internal mammary lymph node most consistent with metastatic adenopathy. 3. Postsurgical change in the RIGHT and LEFT breast. No residual carcinoma identified in the breasts or axilla by FDG PET imaging.     10/23/2022 - 11/05/2022 Radiation Therapy   Plan Name: Chest Site: Thorax including T5-7 and sternum Technique: 3D Mode: Photon Dose Per Fraction: 3 Gy Prescribed Dose (Delivered /  Prescribed): 30 Gy / 30 Gy Prescribed Fxs (Delivered / Prescribed): 10 / 10   11/05/2022 -  Anti-estrogen oral therapy   Anastrozole  daily   12/16/2022 Imaging   IMPRESSION: 1. Osseous metastases T3 through T7 levels as above. Mild chronic compression deformity of T3, possibly pathologic. No extra osseous or epidural extension of tumor. 2. Mild multilevel degenerative spondylosis as above. No significant stenosis or overt neural impingement.     01/16/2023 Cancer Staging   Staging form:  Breast, AJCC 8th Edition - Clinical: Stage IV (cM1) - Signed by Crawford Morna Pickle, NP on 01/16/2023     CURRENT THERAPY: Anastrozole   INTERVAL HISTORY:  Tracie Atkins 68 y.o. female returns for f/u of her metastatic breast cancer.    She was last seen in March. She goes to [place] from time to time, She has been taking anastozole daily, no new adverse side effects She has ongoing back pain in the lower back, takes tylenol  every morning. She is still working with the dentist to help with the gum issues Last imaging of back and brain in May, osseous mets improving. Frontal meningioma Rest of the pertinent 10 point ROS reviewed and negative  Patient Active Problem List   Diagnosis Date Noted   Metastasis to bone (HCC) 01/16/2023   Vitamin D deficiency 01/16/2023   Seasonal allergies 01/16/2023   Obesity (BMI 30.0-34.9) 01/16/2023   Hyperlipidemia 01/16/2023   Essential hypertension 01/16/2023   Alcohol use disorder, severe, dependence (HCC) 11/07/2022   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   Malignant neoplasm of upper-outer quadrant of left breast in female, estrogen receptor positive (HCC) 12/05/2021   Severe episode of recurrent major depressive disorder, without psychotic features (HCC) 08/19/2021   Suicidal ideations    Alcohol abuse with alcohol-induced mood disorder (HCC) 11/20/2016    has no known allergies.  MEDICAL HISTORY: Past Medical History:  Diagnosis Date   Breast cancer (HCC) 2023   left   Chronic dental infection 8/189   multiple teeth removed and Post -op infection.   Depression    ETOH abuse    Fatty liver    Headache     SURGICAL HISTORY: Past Surgical History:  Procedure Laterality Date   BREAST LUMPECTOMY WITH RADIOACTIVE SEED AND SENTINEL LYMPH NODE BIOPSY Left 01/15/2022   Procedure: LEFT BREAST SEED LOCALIZED LUMPECTOMY, LEFT SEED TARGETED AXILLARY LYMPH NODE BIOPSY, LEFT SENTINEL LYMPH NODE BIOPSY;  Surgeon: Aron Shoulders, MD;  Location: [facility] OR;   Service: General;  Laterality: Left;   DENTAL SURGERY     RADIOACTIVE SEED GUIDED EXCISIONAL BREAST BIOPSY Right 01/15/2022   Procedure: RIGHT BREAST SEED LOCALIZED EXCISIONAL BIOPSY;  Surgeon: Aron Shoulders, MD;  Location: [facility] OR;  Service: General;  Laterality: Right;   TUBAL LIGATION      SOCIAL HISTORY: Social History   Socioeconomic History   Marital status: Single    Spouse name: Not on file   Number of children: 3   Years of education: Not on file   Highest education level: Not on file  Occupational History   Not on file  Tobacco Use   Smoking status: Never    Passive exposure: Never   Smokeless tobacco: Never  Vaping Use   Vaping status: Never Used  Substance and Sexual Activity   Alcohol use: Yes    Alcohol/week: 1.0 standard drink of alcohol    Types: 1 Standard drinks or equivalent per week    Comment: 1 mixed (liquor) drink per week   Drug use:  No   Sexual activity: Never  Other Topics Concern   Not on file  Social History Narrative   Not on file   Social Drivers of Health   Financial Resource Strain: Not on file  Food Insecurity: Low Risk  (06/30/2023)   Received from [facility]   Hunger Vital Sign    Within the past 12 months, you worried that your food would run out before you got money to buy more: Never true    Within the past 12 months, the food you bought just didn't last and you didn't have money to get more. : Never true  Transportation Needs: No Transportation Needs (06/30/2023)   Received from [facility]    In the past 12 months, has lack of reliable transportation kept you from medical appointments, meetings, work or from getting things needed for daily living? : No  Physical Activity: Not on file  Stress: Not on file  Social Connections: Not on file  Intimate Partner Violence: Not At Risk (10/16/2022)   Humiliation, Afraid, Rape, and Kick questionnaire    Fear of Current or Ex-Partner: No    Emotionally Abused: No     Physically Abused: No    Sexually Abused: No    FAMILY HISTORY: No family history on file.  Review of Systems  Constitutional:  Negative for appetite change, chills, fatigue, fever and unexpected weight change.  HENT:   Negative for hearing loss, lump/mass and trouble swallowing.   Eyes:  Negative for eye problems and icterus.  Respiratory:  Negative for chest tightness, cough and shortness of breath.   Cardiovascular:  Negative for chest pain, leg swelling and palpitations.  Gastrointestinal:  Negative for abdominal distention, abdominal pain, constipation, diarrhea, nausea and vomiting.  Endocrine: Negative for hot flashes.  Genitourinary:  Negative for difficulty urinating.   Musculoskeletal:  Negative for arthralgias.  Skin:  Negative for itching and rash.  Neurological:  Negative for dizziness, extremity weakness, headaches and numbness.  Hematological:  Negative for adenopathy. Does not bruise/bleed easily.  Psychiatric/Behavioral:  Negative for depression and suicidal ideas. The patient is not nervous/anxious.       PHYSICAL EXAMINATION   Vitals:   02/04/24 1321  BP: 135/83  Pulse: 98  Resp: 17  Temp: 98.1 F (36.7 C)  SpO2: 100%      Physical Exam Constitutional:      General: She is not in acute distress.    Appearance: Normal appearance. She is not toxic-appearing.  HENT:     Head: Normocephalic and atraumatic.     Mouth/Throat:     Mouth: Mucous membranes are moist.     Pharynx: Oropharynx is clear. No oropharyngeal exudate or posterior oropharyngeal erythema.  Eyes:     General: No scleral icterus. Cardiovascular:     Rate and Rhythm: Normal rate and regular rhythm.     Pulses: Normal pulses.     Heart sounds: Normal heart sounds.  Pulmonary:     Effort: Pulmonary effort is normal.     Breath sounds: Normal breath sounds.  Abdominal:     General: Abdomen is flat. Bowel sounds are normal. There is no distension.     Palpations: Abdomen is soft.      Tenderness: There is no abdominal tenderness.  Musculoskeletal:        General: No swelling.     Cervical back: Neck supple.  Lymphadenopathy:     Cervical: No cervical adenopathy.  Skin:    General: Skin  is warm and dry.     Findings: No rash.  Neurological:     General: No focal deficit present.     Mental Status: She is alert.  Psychiatric:        Mood and Affect: Mood normal.        Behavior: Behavior normal.     LABORATORY DATA:  CBC    Component Value Date/Time   WBC 6.3 02/04/2024 1311   RBC 4.31 02/04/2024 1311   HGB 10.4 (L) 02/04/2024 1311   HCT 33.5 (L) 02/04/2024 1311   PLT 223 02/04/2024 1311   MCV 77.7 (L) 02/04/2024 1311   MCH 24.1 (L) 02/04/2024 1311   MCHC 31.0 02/04/2024 1311   RDW 17.3 (H) 02/04/2024 1311   LYMPHSABS 0.9 02/04/2024 1311   MONOABS 0.7 02/04/2024 1311   EOSABS 0.1 02/04/2024 1311   BASOSABS 0.0 02/04/2024 1311    CMP     Component Value Date/Time   NA 139 10/02/2023 0829   K 3.8 10/02/2023 0829   CL 107 10/02/2023 0829   CO2 26 10/02/2023 0829   GLUCOSE 90 10/02/2023 0829   BUN 16 10/02/2023 0829   CREATININE 0.62 10/02/2023 0829   CREATININE 0.68 07/27/2023 1103   CALCIUM 8.9 10/02/2023 0829   PROT 6.5 10/02/2023 0829   ALBUMIN 3.8 10/02/2023 0829   AST 16 10/02/2023 0829   AST 15 07/27/2023 1103   ALT 16 10/02/2023 0829   ALT 12 07/27/2023 1103   ALKPHOS 134 (H) 10/02/2023 0829   BILITOT 0.3 10/02/2023 0829   BILITOT 0.3 07/27/2023 1103   GFRNONAA >60 10/02/2023 0829   GFRNONAA >60 07/27/2023 1103   GFRAA >60 06/12/2018 1323    ASSESSMENT and THERAPY PLAN:   Malignant neoplasm of upper-outer quadrant of left breast in female, estrogen receptor positive (HCC) Metastatic breast cancer Under treatment with anastrozole . No progression on last imaging in 08/2023. Back pain and numbness warrant further investigation for possible progression or new lesions. - Continue anastrozole . - Repeat imaging in August or early  September. - Tumor markers of no yield, they were neg last yr.  Depression Continue lexapro . Refilled today, she is having transportation issues to go to psych follow up. No suicidal ideation. Social and housing issues contribute to distress. - Refer to social work for support with social and housing issues. - Continue follow up with psychiatry.   RTC in 8 weeks for labs and f/u with Dr. Loretha  All questions were answered. The patient knows to call the clinic with any problems, questions or concerns. We can certainly see the patient much sooner if necessary.  Total encounter time:30 minutes*in face-to-face visit time, chart review, lab review, care coordination, order entry, and documentation of the encounter time.   *Total Encounter Time as defined by the Centers for Medicare and Medicaid Services includes, in addition to the face-to-face time of a patient visit (documented in the note above) non-face-to-face time: obtaining and reviewing outside history, ordering and reviewing medications, tests or procedures, care coordination (communications with other health care professionals or caregivers) and documentation in the medical record.

## 2024-02-10 NOTE — Telephone Encounter (Signed)
 No entry

## 2024-03-01 ENCOUNTER — Telehealth: Payer: Self-pay

## 2024-03-01 NOTE — Telephone Encounter (Signed)
 Pt called and LVM asking when her next appt is with Dr Loretha. Called pt and advised 9/29 at 115. She verbalized understanding and knows we will call her for sooner appt if there is anything alarming on CT CAP.

## 2024-03-04 ENCOUNTER — Ambulatory Visit (HOSPITAL_COMMUNITY)
Admission: RE | Admit: 2024-03-04 | Discharge: 2024-03-04 | Disposition: A | Discharge status: Home / Self Care | Source: Ambulatory Visit | Attending: Hematology and Oncology | Admitting: Hematology and Oncology

## 2024-03-04 ENCOUNTER — Encounter

## 2024-03-04 DIAGNOSIS — C50412 Malignant neoplasm of upper-outer quadrant of left female breast: Secondary | ICD-10-CM | POA: Diagnosis present

## 2024-03-04 DIAGNOSIS — Z170 Estrogen receptor positive status [ER+]: Secondary | ICD-10-CM | POA: Insufficient documentation

## 2024-03-04 MED ORDER — IOHEXOL 300 MG/ML  SOLN
100.0000 mL | Freq: Once | INTRAMUSCULAR | Status: AC | PRN
  Administered 2024-03-04: 100 mL via INTRAVENOUS

## 2024-03-08 ENCOUNTER — Telehealth: Payer: Self-pay

## 2024-03-08 NOTE — Telephone Encounter (Signed)
 Pt called to inquire about CT results. Advised pt that radiology has requested 7-10 business days for results to come back. She verbalized thanks and understanding.

## 2024-03-16 ENCOUNTER — Telehealth: Payer: Self-pay

## 2024-03-16 NOTE — Telephone Encounter (Signed)
 Placed call to reading room to request expedited read.

## 2024-03-16 NOTE — Telephone Encounter (Signed)
-----   Message from Knierim Iruku sent at 03/15/2024  4:14 PM EDT ----- Tracie Atkins  Can they read her CT scan.  Thanks, ----- Message ----- From: SYSTEM Sent: 03/09/2024  12:23 AM EDT To: Amber Stalls, MD

## 2024-03-17 ENCOUNTER — Telehealth: Payer: Self-pay | Admitting: *Deleted

## 2024-03-17 NOTE — Telephone Encounter (Signed)
 Patient called requesting results of CT.  I see that they are finalized.  Routing to Dr Loretha to please advise response to patient regarding results.

## 2024-03-18 ENCOUNTER — Emergency Department (HOSPITAL_COMMUNITY)

## 2024-03-18 ENCOUNTER — Encounter (HOSPITAL_COMMUNITY): Payer: Self-pay

## 2024-03-18 ENCOUNTER — Emergency Department (HOSPITAL_COMMUNITY): Admission: EM | Admit: 2024-03-18 | Discharge: 2024-03-18 | Disposition: A | Discharge status: Home / Self Care

## 2024-03-18 ENCOUNTER — Other Ambulatory Visit: Payer: Self-pay

## 2024-03-18 DIAGNOSIS — R1011 Right upper quadrant pain: Secondary | ICD-10-CM | POA: Insufficient documentation

## 2024-03-18 DIAGNOSIS — S0990XA Unspecified injury of head, initial encounter: Secondary | ICD-10-CM | POA: Insufficient documentation

## 2024-03-18 DIAGNOSIS — R0602 Shortness of breath: Secondary | ICD-10-CM | POA: Insufficient documentation

## 2024-03-18 DIAGNOSIS — W06XXXA Fall from bed, initial encounter: Secondary | ICD-10-CM | POA: Diagnosis not present

## 2024-03-18 DIAGNOSIS — I6782 Cerebral ischemia: Secondary | ICD-10-CM | POA: Insufficient documentation

## 2024-03-18 DIAGNOSIS — S2241XA Multiple fractures of ribs, right side, initial encounter for closed fracture: Secondary | ICD-10-CM | POA: Insufficient documentation

## 2024-03-18 DIAGNOSIS — S29001A Unspecified injury of muscle and tendon of front wall of thorax, initial encounter: Secondary | ICD-10-CM | POA: Diagnosis present

## 2024-03-18 LAB — CBC
HCT: 37.5 % (ref 36.0–46.0)
Hemoglobin: 11.1 g/dL — ABNORMAL LOW (ref 12.0–15.0)
MCH: 23.7 pg — ABNORMAL LOW (ref 26.0–34.0)
MCHC: 29.6 g/dL — ABNORMAL LOW (ref 30.0–36.0)
MCV: 80 fL (ref 80.0–100.0)
Platelets: 270 K/uL (ref 150–400)
RBC: 4.69 MIL/uL (ref 3.87–5.11)
RDW: 18.9 % — ABNORMAL HIGH (ref 11.5–15.5)
WBC: 6 K/uL (ref 4.0–10.5)
nRBC: 0 % (ref 0.0–0.2)

## 2024-03-18 LAB — I-STAT CHEM 8, ED
BUN: 15 mg/dL (ref 8–23)
Calcium, Ion: 1.18 mmol/L (ref 1.15–1.40)
Chloride: 104 mmol/L (ref 98–111)
Creatinine, Ser: 0.8 mg/dL (ref 0.44–1.00)
Glucose, Bld: 96 mg/dL (ref 70–99)
HCT: 35 % — ABNORMAL LOW (ref 36.0–46.0)
Hemoglobin: 11.9 g/dL — ABNORMAL LOW (ref 12.0–15.0)
Potassium: 4.6 mmol/L (ref 3.5–5.1)
Sodium: 138 mmol/L (ref 135–145)
TCO2: 22 mmol/L (ref 22–32)

## 2024-03-18 LAB — COMPREHENSIVE METABOLIC PANEL WITH GFR
ALT: 16 U/L (ref 0–44)
AST: 21 U/L (ref 15–41)
Albumin: 4 g/dL (ref 3.5–5.0)
Alkaline Phosphatase: 173 U/L — ABNORMAL HIGH (ref 38–126)
Anion gap: 12 (ref 5–15)
BUN: 15 mg/dL (ref 8–23)
CO2: 22 mmol/L (ref 22–32)
Calcium: 9.2 mg/dL (ref 8.9–10.3)
Chloride: 104 mmol/L (ref 98–111)
Creatinine, Ser: 0.7 mg/dL (ref 0.44–1.00)
GFR, Estimated: >60 mL/min (ref >60)
Glucose, Bld: 97 mg/dL (ref 70–99)
Potassium: 4.8 mmol/L (ref 3.5–5.1)
Sodium: 138 mmol/L (ref 135–145)
Total Bilirubin: 0.3 mg/dL (ref 0.0–1.2)
Total Protein: 6.8 g/dL (ref 6.5–8.1)

## 2024-03-18 LAB — LIPASE, BLOOD: Lipase: 20 U/L (ref 11–51)

## 2024-03-18 MED ORDER — LIDOCAINE 5 % EX PTCH
1.0000 | MEDICATED_PATCH | CUTANEOUS | Status: DC
  Administered 2024-03-18: 1 via TRANSDERMAL
  Filled 2024-03-18: qty 1

## 2024-03-18 MED ORDER — IOHEXOL 300 MG/ML  SOLN
100.0000 mL | Freq: Once | INTRAMUSCULAR | Status: AC | PRN
  Administered 2024-03-18: 100 mL via INTRAVENOUS

## 2024-03-18 MED ORDER — METHOCARBAMOL 500 MG PO TABS
500.0000 mg | ORAL_TABLET | Freq: Once | ORAL | Status: AC
  Administered 2024-03-18: 500 mg via ORAL
  Filled 2024-03-18: qty 1

## 2024-03-18 MED ORDER — LIDOCAINE 5 % EX PTCH: 1.0000 | MEDICATED_PATCH | CUTANEOUS | 0 refills | Status: AC

## 2024-03-18 MED ORDER — METHOCARBAMOL 500 MG PO TABS: 500.0000 mg | ORAL_TABLET | Freq: Two times a day (BID) | ORAL | 0 refills | Status: AC

## 2024-03-18 MED ORDER — OXYCODONE-ACETAMINOPHEN 5-325 MG PO TABS
1.0000 | ORAL_TABLET | Freq: Once | ORAL | Status: AC
  Administered 2024-03-18: 1 via ORAL
  Filled 2024-03-18: qty 1

## 2024-03-18 NOTE — Discharge Instructions (Signed)
 Please use the incentive inspirometer we have given you.  Take Tylenol  every 6-8 hours with dosages as directed on the packaging.  You may use the lidocaine  patches and muscle relaxers for further pain relief.  Please follow-up with your primary doctor.  Return if fevers, chills, shortness of breath, lethargy, inability eat or drink due to nausea vomiting or any new or worsening symptoms that are concerning to you.

## 2024-03-18 NOTE — ED Triage Notes (Signed)
 Patient tripped and fell onto rocks 2 days ago. Has right sided rib pain. No LOC. No blood thinners.

## 2024-03-18 NOTE — ED Provider Notes (Signed)
 [place] EMERGENCY DEPARTMENT AT [facility] Provider Note   CSN: [id number] Arrival date & time: 03/18/24  1149     Patient presents with: Tracie Atkins is a 68 y.o. female.   68 year old female presenting emergency department with right sided rib pain after 2 falls over the past week.  Fell Monday straight onto her stomach after she tripped.  Reported she had some pain somewhat to her left side, then fell out of bed while trying to get up and hit her head and landed on her right side on Wednesday.  Since that time she has had worsening rib pain and right upper quadrant pain.  No nausea no vomiting.  Some mild shortness of breath but due to pain and not being able to take a deep breath.  Eating and drinking normally.  Making normal bowel movements.   Fall       Prior to Admission medications   Medication Sig Start Date End Date Taking? Authorizing Provider  anastrozole  (ARIMIDEX ) 1 MG tablet Take 1 tablet (1 mg total) by mouth daily. 02/04/24   Iruku, Praveena, MD  escitalopram  (LEXAPRO ) 20 MG tablet Take 1 tablet (20 mg total) by mouth daily. 02/04/24   Iruku, Praveena, MD  meloxicam  (MOBIC ) 7.5 MG tablet Take 1 tablet (7.5 mg total) by mouth daily. 11/02/23   Crawford Morna Pickle, NP  montelukast (SINGULAIR) 10 MG tablet Take 10 mg by mouth at bedtime. 12/29/22   [provider]  ondansetron  (ZOFRAN ) 8 MG tablet Take 1 tablet (8 mg total) by mouth every 8 (eight) hours as needed for nausea or vomiting. 08/03/23   Iruku, Praveena, MD  pantoprazole  (PROTONIX ) 40 MG tablet Take 40 mg by mouth at bedtime. 09/17/22   [provider]    Allergies: Patient has no known allergies.    Review of Systems  Updated Vital Signs BP (!) 158/106   Pulse 99   Temp 98.5 F (36.9 C) (Oral)   Resp 19   Ht 5' 4 (1.626 m)   Wt 86.2 kg   SpO2 98%   BMI 32.61 kg/m   Physical Exam Vitals and nursing note reviewed.  Constitutional:      General: She  is not in acute distress.    Appearance: She is not toxic-appearing.  HENT:     Head: Normocephalic.     Nose: Nose normal.     Mouth/Throat:     Mouth: Mucous membranes are moist.  Eyes:     Conjunctiva/sclera: Conjunctivae normal.  Cardiovascular:     Rate and Rhythm: Normal rate and regular rhythm.  Pulmonary:     Effort: Pulmonary effort is normal. No respiratory distress.     Breath sounds: Normal breath sounds.  Abdominal:     General: Abdomen is flat. There is no distension.     Tenderness: There is no abdominal tenderness. There is no guarding or rebound.  Musculoskeletal:     Comments: No midline spinal tenderness.  Right chest wall with tenderness.  Skin:    General: Skin is warm and dry.     Capillary Refill: Capillary refill takes less than 2 seconds.  Neurological:     General: No focal deficit present.     Mental Status: She is alert and oriented to person, place, and time.     Gait: Gait normal.  Psychiatric:        Mood and Affect: Mood normal.        Behavior: Behavior  normal.     (all labs ordered are listed, but only abnormal results are displayed) Labs Reviewed  CBC - Abnormal; Notable for the following components:      Result Value   Hemoglobin 11.1 (*)    MCH 23.7 (*)    MCHC 29.6 (*)    RDW 18.9 (*)    All other components within normal limits  COMPREHENSIVE METABOLIC PANEL WITH GFR - Abnormal; Notable for the following components:   Alkaline Phosphatase 173 (*)    All other components within normal limits  I-STAT CHEM 8, ED - Abnormal; Notable for the following components:   Hemoglobin 11.9 (*)    HCT 35.0 (*)    All other components within normal limits  LIPASE, BLOOD    EKG: None  Radiology: CT CHEST ABDOMEN PELVIS W CONTRAST Result Date: 03/18/2024 CLINICAL DATA:  Blunt poly trauma, tripped and fell onto rocks 2 days ago, right-sided rib pain EXAM: CT CHEST, ABDOMEN, AND PELVIS WITH CONTRAST TECHNIQUE: Multidetector CT imaging of the  chest, abdomen and pelvis was performed following the standard protocol during bolus administration of intravenous contrast. RADIATION DOSE REDUCTION: This exam was performed according to the departmental dose-optimization program which includes automated exposure control, adjustment of the mA and/or kV according to patient size and/or use of iterative reconstruction technique. CONTRAST:  OMNIPAQUE  IOHEXOL  300 MG/ML  SOLN COMPARISON:  Same day radiograph and CT performed March 04, 2024 and prior studies is FINDINGS: CT CHEST FINDINGS Cardiovascular: Normal caliber aorta. Scattered aortic calcifications. No pericardial effusion. Mediastinum/Nodes: No lymphadenopathy. Unchanged calcifications in the thyroid . Unchanged hiatal hernia. Lungs/Pleura: Bibasilar atelectasis. No new consolidations. No pleural effusions. No pneumothorax. Musculoskeletal: Unchanged healing fractures in the mid sternum and right anterior second and third ribs. Acute right anterolateral fourth, sixth, and seventh rib fractures. Unchanged multifocal sclerotic osseous lesions, most prominently at the T5 vertebral body, mid sternum, and right inferior scapula. Similar appearance of old left-sided rib fractures. CT ABDOMEN PELVIS FINDINGS Hepatobiliary: No suspicious liver lesions. Status post cholecystectomy. No biliary ductal dilation Pancreas: Unremarkable. Spleen: Unremarkable. Adrenals/Urinary Tract: Adrenal glands are unremarkable. Symmetric nephrograms. No hydronephrosis or nephrolithiasis. Parapelvic renal cysts bilaterally. Stomach/Bowel: No evidence of bowel obstruction or inflammation. The visualized appendix is unremarkable. Vascular/Lymphatic: Normal caliber abdominal aorta. Reproductive: Unremarkable. Other: No free air or free fluid. Musculoskeletal: Unchanged chronic L2 vertebral body compression deformity. Multilevel degenerative changes. IMPRESSION: 1. New nondisplaced right anterolateral fourth, sixth, and seventh rib  fractures. Unchanged healing right second and third rib fractures and mid sternal fractures. Unchanged likely chronic left-sided rib fractures. 2. Similar appearance of sclerotic osseous metastases. Unchanged additional chronic and incidental findings as noted above. Electronically Signed   By: Michaeline Blanch M.D.   On: 03/18/2024 14:12   CT Head Wo Contrast Result Date: 03/18/2024 CLINICAL DATA:  Provided history: Head trauma, minor. Additional history provided: Fall. EXAM: CT HEAD WITHOUT CONTRAST TECHNIQUE: Contiguous axial images were obtained from the base of the skull through the vertex without intravenous contrast. RADIATION DOSE REDUCTION: This exam was performed according to the departmental dose-optimization program which includes automated exposure control, adjustment of the mA and/or kV according to patient size and/or use of iterative reconstruction technique. COMPARISON:  Brain MRI 11/26/2023.  Head CT 05/29/2023. FINDINGS: Brain: Mild generalized cerebral atrophy. Patchy and ill-defined hypoattenuation within the cerebral white matter, nonspecific but compatible with moderate chronic small vessel ischemic disease. Known 7 mm meningioma along the left frontal convexity (for instance as seen on series 6, image  19) (series 7, image 40). There is no acute intracranial hemorrhage. No demarcated cortical infarct. No extra-axial fluid collection. No midline shift. Vascular: No hyperdense vessel. Atherosclerotic calcifications. Skull: No calvarial fracture or aggressive osseous lesion. Sinuses/Orbits: No mass or acute finding within the imaged orbits. No significant paranasal sinus disease at the imaged levels. IMPRESSION: 1.  No evidence of an acute intracranial abnormality. 2. Known 7 mm left frontal meningioma. 3. Moderate chronic small vessel ischemic changes within the cerebral white matter. 4. Mild generalized cerebral atrophy. Electronically Signed   By: Rockey Childs D.O.   On: 03/18/2024 13:58   DG  Ribs Unilateral W/Chest Right Result Date: 03/18/2024 CLINICAL DATA:  Right rib pain after fall 2 days ago. EXAM: RIGHT RIBS AND CHEST - 3+ VIEW COMPARISON:  August 28, 2023. FINDINGS: No definite acute fracture involving the right ribs. There is no evidence of pneumothorax or pleural effusion. Stable cardiomediastinal silhouette. Large hiatal hernia is noted. Minimal bibasilar subsegmental atelectasis. IMPRESSION: No definite acute right rib fracture. Large hiatal hernia. Minimal bibasilar subsegmental atelectasis. Electronically Signed   By: Lynwood Landy Raddle M.D.   On: 03/18/2024 13:25     Procedures   Medications Ordered in the ED  lidocaine  (LIDODERM ) 5 % 1 patch (1 patch Transdermal Patch Applied 03/18/24 1445)  iohexol  (OMNIPAQUE ) 300 MG/ML solution 100 mL (100 mLs Intravenous Contrast Given 03/18/24 1326)  oxyCODONE -acetaminophen  (PERCOCET/ROXICET) 5-325 MG per tablet 1 tablet (1 tablet Oral Given 03/18/24 1445)  methocarbamol  (ROBAXIN ) tablet 500 mg (500 mg Oral Given 03/18/24 1445)    Clinical Course as of 03/18/24 1508  Fri Mar 18, 2024  1303 CBC(!) No leukocytosis to suggest infectious process.  Minor anemia, appears stable compared to prior labs. [TY]  1303 Creatinine: 0.80 [TY]  1426 CT CHEST ABDOMEN PELVIS W CONTRAST MPRESSION: 1. New nondisplaced right anterolateral fourth, sixth, and seventh rib fractures. Unchanged healing right second and third rib fractures and mid sternal fractures. Unchanged likely chronic left-sided rib fractures.  2. Similar appearance of sclerotic osseous metastases. Unchanged additional chronic and incidental findings as noted above.   Electronically Signed   By: Michaeline Blanch M.D.   On: 03/18/2024 14:12   [TY]  1426 CT Head Wo Contrast IMPRESSION: 1.  No evidence of an acute intracranial abnormality. 2. Known 7 mm left frontal meningioma. 3. Moderate chronic small vessel ischemic changes within the cerebral white matter. 4. Mild  generalized cerebral atrophy.   Electronically Signed   By: Rockey Childs D.O.   On: 03/18/2024 13:58   [TY]  1426 Comprehensive metabolic panel(!) No metabolic derangements.  Normal kidney function.  No transaminitis to suggest hepatic injury [TY]  1427 Lipase: 20 Acute pancreatitis unlikely [TY]  1427 Given patient's CT scans with multiple rib fractures, her advanced age and comorbidities that predispose her for decompensation will admit for pain control and breathing PT.  Consulting trauma [TY]  1445 Discussed with trauma team PA, will discuss with attending, but initial thought is oral pain control and follow-up outpatient. [TY]  1505 PA discussed with their attending who reviewed case and feels the patient could be managed outpatient with pain medications.  Discussed with patient who is agreeable to plan.  Given return precautions.  Stable for discharge. [TY]    Clinical Course User Index [TY] Neysa Caron PARAS, DO  Medical Decision Making This is a 68 year old female presenting emergency department after fall with right rib pain.  She is not on blood thinner per my independent review of her chart.  She is afebrile nontachycardic and maintaining oxygen  saturation on room air.  Does appear somewhat uncomfortable, concern for possible rib fractures.  However difficult to differentiate between rib versus right upper quadrant.  Labs and CT scans obtained.  Workup as noted in ED course reassuring,  but did show multiple rib fractures on the right, discussed case with trauma team: See ED course given pain medications.  Pain appears to be controlled.  Ambulatory in the department without issues.  Will discharge in stable condition incentive inspirometer given.   with follow-up with her PCP.  Amount and/or Complexity of Data Reviewed Labs: ordered. Decision-making details documented in ED Course. Radiology: ordered. Decision-making details documented in ED  Course.  Risk Prescription drug management.       Final diagnoses:  Closed fracture of multiple ribs of right side, initial encounter    ED Discharge Orders     None          Neysa Caron PARAS, DO 03/18/24 1508

## 2024-03-29 ENCOUNTER — Ambulatory Visit
Admission: RE | Admit: 2024-03-29 | Discharge: 2024-03-29 | Disposition: A | Discharge status: Home / Self Care | Source: Ambulatory Visit | Attending: Family Medicine

## 2024-03-29 ENCOUNTER — Ambulatory Visit

## 2024-03-29 DIAGNOSIS — N63 Unspecified lump in unspecified breast: Secondary | ICD-10-CM

## 2024-04-04 ENCOUNTER — Telehealth: Payer: Self-pay | Admitting: Hematology and Oncology

## 2024-04-04 ENCOUNTER — Inpatient Hospital Stay: Admitting: Hematology and Oncology

## 2024-04-04 NOTE — Telephone Encounter (Signed)
 Called pt ans spoke with her  regarding reschedule and pt is aware.

## 2024-04-17 ENCOUNTER — Other Ambulatory Visit: Payer: Self-pay

## 2024-04-17 ENCOUNTER — Emergency Department (HOSPITAL_COMMUNITY)
Admission: EM | Admit: 2024-04-17 | Discharge: 2024-04-17 | Disposition: A | Discharge status: Home / Self Care | Attending: Emergency Medicine | Admitting: Emergency Medicine

## 2024-04-17 ENCOUNTER — Encounter (HOSPITAL_COMMUNITY): Payer: Self-pay | Admitting: Emergency Medicine

## 2024-04-17 DIAGNOSIS — T6591XA Toxic effect of unspecified substance, accidental (unintentional), initial encounter: Secondary | ICD-10-CM

## 2024-04-17 DIAGNOSIS — T391X1A Poisoning by 4-Aminophenol derivatives, accidental (unintentional), initial encounter: Secondary | ICD-10-CM | POA: Insufficient documentation

## 2024-04-17 LAB — COMPREHENSIVE METABOLIC PANEL WITH GFR
ALT: 13 U/L (ref 0–44)
AST: 20 U/L (ref 15–41)
Albumin: 3.4 g/dL — ABNORMAL LOW (ref 3.5–5.0)
Alkaline Phosphatase: 132 U/L — ABNORMAL HIGH (ref 38–126)
Anion gap: 11 (ref 5–15)
BUN: 24 mg/dL — ABNORMAL HIGH (ref 8–23)
CO2: 22 mmol/L (ref 22–32)
Calcium: 8.3 mg/dL — ABNORMAL LOW (ref 8.9–10.3)
Chloride: 109 mmol/L (ref 98–111)
Creatinine, Ser: 0.81 mg/dL (ref 0.44–1.00)
GFR, Estimated: >60 mL/min (ref >60)
Glucose, Bld: 105 mg/dL — ABNORMAL HIGH (ref 70–99)
Potassium: 3.5 mmol/L (ref 3.5–5.1)
Sodium: 142 mmol/L (ref 135–145)
Total Bilirubin: <0.2 mg/dL (ref 0.0–1.2)
Total Protein: 5.3 g/dL — ABNORMAL LOW (ref 6.5–8.1)

## 2024-04-17 LAB — CBC WITH DIFFERENTIAL/PLATELET
Abs Immature Granulocytes: 0.02 K/uL (ref 0.00–0.07)
Basophils Absolute: 0 K/uL (ref 0.0–0.1)
Basophils Relative: 0 %
Eosinophils Absolute: 0.1 K/uL (ref 0.0–0.5)
Eosinophils Relative: 1 %
HCT: 36.6 % (ref 36.0–46.0)
Hemoglobin: 11.1 g/dL — ABNORMAL LOW (ref 12.0–15.0)
Immature Granulocytes: 0 %
Lymphocytes Relative: 15 %
Lymphs Abs: 1 K/uL (ref 0.7–4.0)
MCH: 24.3 pg — ABNORMAL LOW (ref 26.0–34.0)
MCHC: 30.3 g/dL (ref 30.0–36.0)
MCV: 80.1 fL (ref 80.0–100.0)
Monocytes Absolute: 0.6 K/uL (ref 0.1–1.0)
Monocytes Relative: 9 %
Neutro Abs: 5.1 K/uL (ref 1.7–7.7)
Neutrophils Relative %: 75 %
Platelets: 264 K/uL (ref 150–400)
RBC: 4.57 MIL/uL (ref 3.87–5.11)
RDW: 18.1 % — ABNORMAL HIGH (ref 11.5–15.5)
WBC: 6.8 K/uL (ref 4.0–10.5)
nRBC: 0 % (ref 0.0–0.2)

## 2024-04-17 LAB — ACETAMINOPHEN LEVEL: Acetaminophen (Tylenol), Serum: <10 ug/mL — ABNORMAL LOW (ref 10–30)

## 2024-04-17 LAB — ETHANOL: Alcohol, Ethyl (B): 44 mg/dL — ABNORMAL HIGH (ref <15)

## 2024-04-17 MED ORDER — SODIUM CHLORIDE 0.9 % IV BOLUS
1000.0000 mL | Freq: Once | INTRAVENOUS | Status: AC
  Administered 2024-04-17: 1000 mL via INTRAVENOUS

## 2024-04-17 NOTE — ED Triage Notes (Signed)
 Pt BIB [facility] with reports of feeling depressed, smoking crack, and drinking a fifth of liquor. Pt reports she took 4 tylenol  PM because she needed help.

## 2024-04-17 NOTE — ED Notes (Signed)
 RN introduced self to pt at shift change. Pt states she wants to leave.  MD notified.  MD states he will discharge pt.  IV removed.

## 2024-04-17 NOTE — Discharge Instructions (Signed)
Follow-up with your doctor tomorrow as planned 

## 2024-04-18 ENCOUNTER — Telehealth: Payer: Self-pay

## 2024-04-18 NOTE — ED Provider Notes (Signed)
 [place] EMERGENCY DEPARTMENT AT [facility] Provider Note   CSN: [id number] Arrival date & time: 04/17/24  1522     Patient presents with: Ingestion   Tracie Atkins is a 68 y.o. female.   Patient with a history of cancer.  Patient states that she took 4 Tylenol  PM because of the discomfort all over.  Patient states she is depressed  The history is provided by the patient and medical records. No language interpreter was used.  Ingestion This is a new problem. The current episode started 6 to 12 hours ago. The problem occurs rarely. The problem has been resolved. Pertinent negatives include no chest pain, no abdominal pain and no headaches. Nothing aggravates the symptoms. Nothing relieves the symptoms.       Prior to Admission medications   Medication Sig Start Date End Date Taking? Authorizing Provider  anastrozole  (ARIMIDEX ) 1 MG tablet Take 1 tablet (1 mg total) by mouth daily. 02/04/24   Iruku, Praveena, MD  escitalopram  (LEXAPRO ) 20 MG tablet Take 1 tablet (20 mg total) by mouth daily. 02/04/24   Iruku, Praveena, MD  lidocaine  (LIDODERM ) 5 % Place 1 patch onto the skin daily. Remove & Discard patch within 12 hours or as directed by MD 03/18/24   Neysa Caron PARAS, DO  meloxicam  (MOBIC ) 7.5 MG tablet Take 1 tablet (7.5 mg total) by mouth daily. 11/02/23   Crawford Morna Pickle, NP  methocarbamol  (ROBAXIN ) 500 MG tablet Take 1 tablet (500 mg total) by mouth 2 (two) times daily. 03/18/24   Neysa Caron PARAS, DO  montelukast (SINGULAIR) 10 MG tablet Take 10 mg by mouth at bedtime. 12/29/22   [provider]  ondansetron  (ZOFRAN ) 8 MG tablet Take 1 tablet (8 mg total) by mouth every 8 (eight) hours as needed for nausea or vomiting. 08/03/23   Iruku, Praveena, MD  pantoprazole  (PROTONIX ) 40 MG tablet Take 40 mg by mouth at bedtime. 09/17/22   [provider]    Allergies: Patient has no known allergies.    Review of Systems  Constitutional:  Negative for  appetite change and fatigue.  HENT:  Negative for congestion, ear discharge and sinus pressure.   Eyes:  Negative for discharge.  Respiratory:  Negative for cough.   Cardiovascular:  Negative for chest pain.  Gastrointestinal:  Negative for abdominal pain and diarrhea.  Genitourinary:  Negative for frequency and hematuria.  Musculoskeletal:  Negative for back pain.  Skin:  Negative for rash.  Neurological:  Negative for seizures and headaches.  Psychiatric/Behavioral:  Negative for hallucinations.     Updated Vital Signs BP 139/77   Pulse (!) 113   Temp 98.5 F (36.9 C) (Oral)   Resp 17   SpO2 96%   Physical Exam Vitals and nursing note reviewed.  Constitutional:      Appearance: She is well-developed.  HENT:     Head: Normocephalic.     Nose: Nose normal.  Eyes:     General: No scleral icterus.    Conjunctiva/sclera: Conjunctivae normal.  Neck:     Thyroid : No thyromegaly.  Cardiovascular:     Rate and Rhythm: Normal rate and regular rhythm.     Heart sounds: No murmur heard.    No friction rub. No gallop.  Pulmonary:     Breath sounds: No stridor. No wheezing or rales.  Chest:     Chest wall: No tenderness.  Abdominal:     General: There is no distension.     Tenderness:  There is no abdominal tenderness. There is no rebound.  Musculoskeletal:        General: Normal range of motion.     Cervical back: Neck supple.  Lymphadenopathy:     Cervical: No cervical adenopathy.  Skin:    Findings: No erythema or rash.  Neurological:     Mental Status: She is alert and oriented to person, place, and time.     Motor: No abnormal muscle tone.     Coordination: Coordination normal.  Psychiatric:     Comments: Depressed but not suicidal or homicidal     (all labs ordered are listed, but only abnormal results are displayed) Labs Reviewed  CBC WITH DIFFERENTIAL/PLATELET - Abnormal; Notable for the following components:      Result Value   Hemoglobin 11.1 (*)    MCH  24.3 (*)    RDW 18.1 (*)    All other components within normal limits  ACETAMINOPHEN  LEVEL - Abnormal; Notable for the following components:   Acetaminophen  (Tylenol ), Serum <10 (*)    All other components within normal limits  ETHANOL - Abnormal; Notable for the following components:   Alcohol, Ethyl (B) 44 (*)    All other components within normal limits  COMPREHENSIVE METABOLIC PANEL WITH GFR - Abnormal; Notable for the following components:   Glucose, Bld 105 (*)    BUN 24 (*)    Calcium 8.3 (*)    Total Protein 5.3 (*)    Albumin 3.4 (*)    Alkaline Phosphatase 132 (*)    All other components within normal limits    EKG: None  Radiology: No results found.   Procedures   Medications Ordered in the ED  sodium chloride  0.9 % bolus 1,000 mL (0 mLs Intravenous Stopped 04/17/24 1845)                                    Medical Decision Making Amount and/or Complexity of Data Reviewed Labs: ordered.  Patient with a minor ingestion of Tylenol .  No need to treat for Tylenol  overdose.  Patient with depression but she did not want to speak to behavioral health.  She was discharged home and follow-up with her PCP     Final diagnoses:  Accidental ingestion of substance, initial encounter    ED Discharge Orders     None          Suzette Pac, MD 04/18/24 1052

## 2024-04-18 NOTE — Telephone Encounter (Signed)
 Left message on voicemail about upcoming appointment 10/14

## 2024-04-19 ENCOUNTER — Inpatient Hospital Stay

## 2024-04-19 ENCOUNTER — Ambulatory Visit: Attending: Internal Medicine | Admitting: Hematology and Oncology

## 2024-04-20 ENCOUNTER — Other Ambulatory Visit: Payer: Self-pay

## 2024-04-20 ENCOUNTER — Encounter (HOSPITAL_COMMUNITY): Payer: Self-pay

## 2024-04-20 ENCOUNTER — Emergency Department (HOSPITAL_COMMUNITY)
Admission: EM | Admit: 2024-04-20 | Discharge: 2024-04-21 | Disposition: A | Discharge status: Other Acute Inpatient Hospital | Attending: Emergency Medicine | Admitting: Emergency Medicine

## 2024-04-20 DIAGNOSIS — F1410 Cocaine abuse, uncomplicated: Secondary | ICD-10-CM | POA: Diagnosis not present

## 2024-04-20 DIAGNOSIS — R4689 Other symptoms and signs involving appearance and behavior: Secondary | ICD-10-CM | POA: Diagnosis present

## 2024-04-20 DIAGNOSIS — F1010 Alcohol abuse, uncomplicated: Secondary | ICD-10-CM

## 2024-04-20 DIAGNOSIS — Z853 Personal history of malignant neoplasm of breast: Secondary | ICD-10-CM | POA: Insufficient documentation

## 2024-04-20 DIAGNOSIS — Y907 Blood alcohol level of 200-239 mg/100 ml: Secondary | ICD-10-CM | POA: Diagnosis not present

## 2024-04-20 DIAGNOSIS — F332 Major depressive disorder, recurrent severe without psychotic features: Secondary | ICD-10-CM | POA: Diagnosis not present

## 2024-04-20 DIAGNOSIS — F1014 Alcohol abuse with alcohol-induced mood disorder: Secondary | ICD-10-CM | POA: Insufficient documentation

## 2024-04-20 DIAGNOSIS — R45851 Suicidal ideations: Secondary | ICD-10-CM | POA: Diagnosis not present

## 2024-04-20 LAB — RAPID URINE DRUG SCREEN, HOSP PERFORMED
Amphetamines: NOT DETECTED
Barbiturates: NOT DETECTED
Benzodiazepines: NOT DETECTED
Cocaine: POSITIVE — AB
Opiates: NOT DETECTED
Tetrahydrocannabinol: NOT DETECTED

## 2024-04-20 LAB — RESP PANEL BY RT-PCR (RSV, FLU A&B, COVID)  RVPGX2
Influenza A by PCR: NEGATIVE
Influenza B by PCR: NEGATIVE
Resp Syncytial Virus by PCR: NEGATIVE
SARS Coronavirus 2 by RT PCR: NEGATIVE

## 2024-04-20 LAB — CBC
HCT: 31.9 % — ABNORMAL LOW (ref 36.0–46.0)
Hemoglobin: 9.8 g/dL — ABNORMAL LOW (ref 12.0–15.0)
MCH: 24.7 pg — ABNORMAL LOW (ref 26.0–34.0)
MCHC: 30.7 g/dL (ref 30.0–36.0)
MCV: 80.4 fL (ref 80.0–100.0)
Platelets: 227 K/uL (ref 150–400)
RBC: 3.97 MIL/uL (ref 3.87–5.11)
RDW: 18 % — ABNORMAL HIGH (ref 11.5–15.5)
WBC: 4.9 K/uL (ref 4.0–10.5)
nRBC: 0 % (ref 0.0–0.2)

## 2024-04-20 LAB — COMPREHENSIVE METABOLIC PANEL WITH GFR
ALT: 15 U/L (ref 0–44)
AST: 20 U/L (ref 15–41)
Albumin: 3.2 g/dL — ABNORMAL LOW (ref 3.5–5.0)
Alkaline Phosphatase: 135 U/L — ABNORMAL HIGH (ref 38–126)
Anion gap: 12 (ref 5–15)
BUN: 14 mg/dL (ref 8–23)
CO2: 23 mmol/L (ref 22–32)
Calcium: 8.4 mg/dL — ABNORMAL LOW (ref 8.9–10.3)
Chloride: 109 mmol/L (ref 98–111)
Creatinine, Ser: 0.76 mg/dL (ref 0.44–1.00)
GFR, Estimated: >60 mL/min (ref >60)
Glucose, Bld: 93 mg/dL (ref 70–99)
Potassium: 3.6 mmol/L (ref 3.5–5.1)
Sodium: 144 mmol/L (ref 135–145)
Total Bilirubin: 0.3 mg/dL (ref 0.0–1.2)
Total Protein: 6 g/dL — ABNORMAL LOW (ref 6.5–8.1)

## 2024-04-20 LAB — ETHANOL: Alcohol, Ethyl (B): 202 mg/dL — ABNORMAL HIGH (ref <15)

## 2024-04-20 MED ORDER — LORAZEPAM 1 MG PO TABS
1.0000 mg | ORAL_TABLET | ORAL | Status: DC | PRN
  Administered 2024-04-20: 1 mg via ORAL
  Filled 2024-04-20: qty 1

## 2024-04-20 MED ORDER — THIAMINE HCL 100 MG/ML IJ SOLN: 100.0000 mg | Freq: Every day | INTRAMUSCULAR | Status: DC

## 2024-04-20 MED ORDER — THIAMINE MONONITRATE 100 MG PO TABS
100.0000 mg | ORAL_TABLET | Freq: Every day | ORAL | Status: DC
  Administered 2024-04-20 – 2024-04-21 (×2): 100 mg via ORAL
  Filled 2024-04-20 (×2): qty 1

## 2024-04-20 MED ORDER — FOLIC ACID 1 MG PO TABS
1.0000 mg | ORAL_TABLET | Freq: Every day | ORAL | Status: DC
  Administered 2024-04-20 – 2024-04-21 (×2): 1 mg via ORAL
  Filled 2024-04-20 (×2): qty 1

## 2024-04-20 MED ORDER — ADULT MULTIVITAMIN W/MINERALS CH
1.0000 | ORAL_TABLET | Freq: Every day | ORAL | Status: DC
  Administered 2024-04-20 – 2024-04-21 (×2): 1 via ORAL
  Filled 2024-04-20 (×2): qty 1

## 2024-04-20 MED ORDER — LORAZEPAM 2 MG/ML IJ SOLN: 1.0000 mg | INTRAMUSCULAR | Status: DC | PRN

## 2024-04-20 MED ORDER — QUETIAPINE FUMARATE ER 50 MG PO TB24
50.0000 mg | ORAL_TABLET | Freq: Every day | ORAL | Status: DC
  Administered 2024-04-20: 50 mg via ORAL
  Filled 2024-04-20: qty 1

## 2024-04-20 NOTE — ED Notes (Signed)
 Report was received by previous RN. Pt currently sleeping. Was awaken briefly by this RN for repeat VS. Reports improvement of headache. Remains in blue hospital scrubs. Had sitter at bedside. Water on bedside table. Pt denies any needs/concerns.

## 2024-04-20 NOTE — Progress Notes (Signed)
 Inpatient Psychiatric Referral  Patient was recommended inpatient per Elveria Batter, NP . There are no available beds at [facility], per [facility]. Patient was referred to the following out of network facilities:  [facility] Provider Address Phone Fax  [facility]  [address], [place] [address] [phone]  [facility]  [address] [place], [place] [address] [phone]  [facility]  [address] [place], [place] [address] [phone]  [facility]  [address] [place], [place] [address] [phone]  [facility]  [address], [place] [address] [phone]  [facility]  [address] [place] [address] [phone]  [facility]  [address] [place] [address] [phone]  [facility] EFAX  [address] [place], [place] [phone]  [facility]  [address] [place] [address] [phone]    Situation ongoing, CSW to continue following and update chart as more information becomes available.   Harrie Sofia MSW, LCSWA 04/20/2024  7:00PM

## 2024-04-20 NOTE — ED Notes (Signed)
 IVC'd 04/20/24, exp 04/27/24, IVC docs currently in [facility] Zone.  All copies, faxes, filings complete.

## 2024-04-20 NOTE — ED Notes (Signed)
 Pt dressed out in blue scrubs in triage before walked to H21.

## 2024-04-20 NOTE — ED Notes (Signed)
 Pt coordinator Kindle from [facility] called regarding pt status. Was informed pt has been cooperative with staff. No IM medications given. Pt from home. Independent with ADLs. No further update available at this time.

## 2024-04-20 NOTE — ED Provider Notes (Signed)
 [place] EMERGENCY DEPARTMENT AT [facility] Provider Note  CSN: [id number] Arrival date & time: 04/20/24 1023  Chief Complaint(s) IVC: SI  HPI Tracie Atkins is a 68 y.o. female with past medical history as below, significant for alcohol abuse, depression, metastatic cancer who presents to the ED with complaint of suicidal, IVC  Patient reports that she has been drinking heavily over the past few days, last alcohol consumption was early this morning.  She has also been utilizing crack cocaine.  She stopped taking her cancer related medications.  She reports that she wants to kill herself.  She reported feeling ember that she wanted to try to hang herself.  Family called for welfare check from local police and upon arrival to the residence patient reported suicidal thoughts, she appeared intoxicated.  She is brought to the ER for further evaluation.  Patient requesting help, reports that her family does not care for her anymore and that nobody would care if she died  Past Medical History Past Medical History:  Diagnosis Date   Breast cancer (HCC) 2023   left   Chronic dental infection 8/189   multiple teeth removed and Post -op infection.   Depression    ETOH abuse    Fatty liver    Headache    Patient Active Problem List   Diagnosis Date Noted   Metastasis to bone (HCC) 01/16/2023   Vitamin D deficiency 01/16/2023   Seasonal allergies 01/16/2023   Obesity (BMI 30.0-34.9) 01/16/2023   Hyperlipidemia 01/16/2023   Essential hypertension 01/16/2023   Alcohol use disorder, severe, dependence (HCC) 11/07/2022   Pituitary mass 07/31/2022   Malignant neoplasm of upper-outer quadrant of left breast in female, estrogen receptor positive (HCC) 12/05/2021   Severe episode of recurrent major depressive disorder, without psychotic features (HCC) 08/19/2021   Suicidal ideations    Alcohol abuse with alcohol-induced mood disorder (HCC) 11/20/2016   Home Medication(s) Prior to  Admission medications   Medication Sig Start Date End Date Taking? Authorizing Provider  anastrozole  (ARIMIDEX ) 1 MG tablet Take 1 tablet (1 mg total) by mouth daily. 02/04/24   Iruku, Praveena, MD  escitalopram  (LEXAPRO ) 20 MG tablet Take 1 tablet (20 mg total) by mouth daily. 02/04/24   Iruku, Praveena, MD  lidocaine  (LIDODERM ) 5 % Place 1 patch onto the skin daily. Remove & Discard patch within 12 hours or as directed by MD 03/18/24   Neysa Caron PARAS, DO  meloxicam  (MOBIC ) 7.5 MG tablet Take 1 tablet (7.5 mg total) by mouth daily. 11/02/23   Crawford Morna Pickle, NP  methocarbamol  (ROBAXIN ) 500 MG tablet Take 1 tablet (500 mg total) by mouth 2 (two) times daily. 03/18/24   Neysa Caron PARAS, DO  montelukast (SINGULAIR) 10 MG tablet Take 10 mg by mouth at bedtime. 12/29/22   [provider]  ondansetron  (ZOFRAN ) 8 MG tablet Take 1 tablet (8 mg total) by mouth every 8 (eight) hours as needed for nausea or vomiting. 08/03/23   Iruku, Praveena, MD  pantoprazole  (PROTONIX ) 40 MG tablet Take 40 mg by mouth at bedtime. 09/17/22   [provider]  Past Surgical History Past Surgical History:  Procedure Laterality Date   BREAST LUMPECTOMY WITH RADIOACTIVE SEED AND SENTINEL LYMPH NODE BIOPSY Left 01/15/2022   Procedure: LEFT BREAST SEED LOCALIZED LUMPECTOMY, LEFT SEED TARGETED AXILLARY LYMPH NODE BIOPSY, LEFT SENTINEL LYMPH NODE BIOPSY;  Surgeon: Aron Shoulders, MD;  Location: [facility] OR;  Service: General;  Laterality: Left;   DENTAL SURGERY     RADIOACTIVE SEED GUIDED EXCISIONAL BREAST BIOPSY Right 01/15/2022   Procedure: RIGHT BREAST SEED LOCALIZED EXCISIONAL BIOPSY;  Surgeon: Aron Shoulders, MD;  Location: [facility] OR;  Service: General;  Laterality: Right;   TUBAL LIGATION     Family History History reviewed. No pertinent family history.  Social History Social History    Tobacco Use   Smoking status: Never    Passive exposure: Never   Smokeless tobacco: Never  Vaping Use   Vaping status: Never Used  Substance Use Topics   Alcohol use: Yes    Alcohol/week: 1.0 standard drink of alcohol    Types: 1 Standard drinks or equivalent per week    Comment: 1 mixed (liquor) drink per week   Drug use: No   Allergies Patient has no known allergies.  Review of Systems A thorough review of systems was obtained and all systems are negative except as noted in the HPI and PMH.   Physical Exam Vital Signs  I have reviewed the triage vital signs BP (!) 124/97   Pulse 97   Temp 97.7 F (36.5 C) (Temporal)   Resp 16   Ht 5' 4 (1.626 m)   Wt 86.2 kg   SpO2 100%   BMI 32.61 kg/m  Physical Exam Vitals and nursing note reviewed.  Constitutional:      General: She is not in acute distress.    Appearance: Normal appearance. She is well-developed. She is not ill-appearing.  HENT:     Head: Normocephalic and atraumatic.     Right Ear: External ear normal.     Left Ear: External ear normal.     Nose: Nose normal.     Mouth/Throat:     Mouth: Mucous membranes are moist.  Eyes:     General: No scleral icterus.       Right eye: No discharge.        Left eye: No discharge.  Cardiovascular:     Rate and Rhythm: Normal rate.  Pulmonary:     Effort: Pulmonary effort is normal. No respiratory distress.     Breath sounds: No stridor.  Abdominal:     General: Abdomen is flat. There is no distension.     Tenderness: There is no guarding.  Musculoskeletal:        General: No deformity.     Cervical back: No rigidity.  Skin:    General: Skin is warm and dry.     Coloration: Skin is not cyanotic, jaundiced or pale.  Neurological:     Mental Status: She is alert.  Psychiatric:        Mood and Affect: Mood is depressed. Affect is flat.        Speech: Speech is delayed.        Behavior: Behavior is withdrawn. Behavior is cooperative.        Thought Content:  Thought content is not paranoid or delusional. Thought content includes suicidal ideation. Thought content includes suicidal plan.     ED Results and Treatments Labs (all labs ordered are listed, but only abnormal results are displayed) Labs Reviewed  COMPREHENSIVE METABOLIC  PANEL WITH GFR - Abnormal; Notable for the following components:      Result Value   Calcium 8.4 (*)    Total Protein 6.0 (*)    Albumin 3.2 (*)    Alkaline Phosphatase 135 (*)    All other components within normal limits  ETHANOL - Abnormal; Notable for the following components:   Alcohol, Ethyl (B) 202 (*)    All other components within normal limits  CBC - Abnormal; Notable for the following components:   Hemoglobin 9.8 (*)    HCT 31.9 (*)    MCH 24.7 (*)    RDW 18.0 (*)    All other components within normal limits  RESP PANEL BY RT-PCR (RSV, FLU A&B, COVID)  RVPGX2  RAPID URINE DRUG SCREEN, HOSP PERFORMED                                                                                                                          Radiology No results found.  Pertinent labs & imaging results that were available during my care of the patient were reviewed by me and considered in my medical decision making (see MDM for details).  Medications Ordered in ED Medications  LORazepam  (ATIVAN ) tablet 1-4 mg (has no administration in time range)    Or  LORazepam  (ATIVAN ) injection 1-4 mg (has no administration in time range)  thiamine  (VITAMIN B1) tablet 100 mg (has no administration in time range)    Or  thiamine  (VITAMIN B1) injection 100 mg (has no administration in time range)  folic acid  (FOLVITE ) tablet 1 mg (has no administration in time range)  multivitamin with minerals tablet 1 tablet (has no administration in time range)                                                                                                                                     Procedures Procedures  (including critical care  time)  Medical Decision Making / ED Course    Medical Decision Making:    PRIYA MATSEN is a 68 y.o. female with past medical history as below, significant for alcohol abuse, depression, metastatic cancer who presents to the ED with complaint of suicidal, IVC. The complaint involves an extensive differential diagnosis and also carries with it a high risk of complications and morbidity.  Serious etiology was considered. Ddx includes but is not limited to: Substance-induced psychiatric  disturbance, psychosis, suicidal, medication effect, intoxication, etc.  Complete initial physical exam performed, notably the patient was in no acute distress.    Reviewed and confirmed nursing documentation for past medical history, family history, social history.  Vital signs reviewed.    Suicidal Etoh abuse > - Patient reports heavy alcohol use over the past few days and crack cocaine use. -Suicidal with plan to hang herself. - IVC paperwork started by family/PD - Will uphold IVC given patient expressing acute suicidal thoughts. - CIWA protocol ordered, patient does not appear to be acutely withdrawing from alcohol. - She is medically cleared pending TTS evaluation                     Additional history obtained: -Additional history obtained from PD -External records from outside source obtained and reviewed including: Chart review including previous notes, labs, imaging, consultation notes including  Prior er eval   Lab Tests: -I ordered, reviewed, and interpreted labs.   The pertinent results include:   Labs Reviewed  COMPREHENSIVE METABOLIC PANEL WITH GFR - Abnormal; Notable for the following components:      Result Value   Calcium 8.4 (*)    Total Protein 6.0 (*)    Albumin 3.2 (*)    Alkaline Phosphatase 135 (*)    All other components within normal limits  ETHANOL - Abnormal; Notable for the following components:   Alcohol, Ethyl (B) 202 (*)    All other components  within normal limits  CBC - Abnormal; Notable for the following components:   Hemoglobin 9.8 (*)    HCT 31.9 (*)    MCH 24.7 (*)    RDW 18.0 (*)    All other components within normal limits  RESP PANEL BY RT-PCR (RSV, FLU A&B, COVID)  RVPGX2  RAPID URINE DRUG SCREEN, HOSP PERFORMED    Notable for hgb mild reduced from prior   EKG   EKG Interpretation Date/Time:  Wednesday April 20 2024 11:27:10 EDT Ventricular Rate:  100 PR Interval:  138 QRS Duration:  70 QT Interval:  360 QTC Calculation: 464 R Axis:   33  Text Interpretation: Normal sinus rhythm Normal ECG When compared with ECG of 28-Aug-2023 12:03, PREVIOUS ECG IS PRESENT Confirmed by Elnor Savant ([id number]) on 04/20/2024 2:00:35 PM         Imaging Studies ordered: na   Medicines ordered and prescription drug management: Meds ordered this encounter  Medications   OR Linked Order Group    LORazepam  (ATIVAN ) tablet 1-4 mg     CIWA-AR < 5 =:   0 mg     CIWA-AR 5 -10 =:   1 mg     CIWA-AR 11 -15 =:   2 mg     CIWA-AR 16 -20 =:   3 mg     CIWA-AR 16 -20 =:   Recheck CIWA-AR in 1 hour; if > 20 notify MD     CIWA-AR > 20 =:   4 mg     CIWA-AR > 20 =:   Call Rapid Response    LORazepam  (ATIVAN ) injection 1-4 mg     CIWA-AR < 5 =:   0 mg     CIWA-AR 5 -10 =:   1 mg     CIWA-AR 11 -15 =:   2 mg     CIWA-AR 16 -20 =:   3 mg     CIWA-AR 16 -20 =:   Recheck CIWA-AR in 1 hour; if >  20 notify MD     CIWA-AR > 20 =:   4 mg     CIWA-AR > 20 =:   Call Rapid Response   OR Linked Order Group    thiamine  (VITAMIN B1) tablet 100 mg    thiamine  (VITAMIN B1) injection 100 mg   folic acid  (FOLVITE ) tablet 1 mg   multivitamin with minerals tablet 1 tablet    -I have reviewed the patients home medicines and have made adjustments as needed   Consultations Obtained: I requested consultation with the TTS,  and discussed lab and imaging findings as well as pertinent plan   Cardiac Monitoring: Continuous pulse oximetry interpreted  by myself, 100% on RA.    Social Determinants of Health:  Diagnosis or treatment significantly limited by social determinants of health: obesity, alcohol use, and lives alone   Reevaluation: After the interventions noted above, I reevaluated the patient and found that they have stayed the same  Co morbidities that complicate the patient evaluation  Past Medical History:  Diagnosis Date   Breast cancer (HCC) 2023   left   Chronic dental infection 8/189   multiple teeth removed and Post -op infection.   Depression    ETOH abuse    Fatty liver    Headache       Dispostion: Disposition decision including need for hospitalization was considered, and patient disposition pending at time of sign out.    Final Clinical Impression(s) / ED Diagnoses Final diagnoses:  Suicidal ideation  Alcohol abuse        Elnor Jayson LABOR, DO 04/20/24 1521

## 2024-04-20 NOTE — Progress Notes (Signed)
 Pt has been accepted to [facility] on 04/20/2024 . Unit  assignment: [id number]  Pt meets inpatient criteria per Elveria Batter, NP   Attending Physician will be Dr. Odella Jumper   Report can be called to: [phone]  Pt can arrive after 8AM   Care Team Notified: Claudetta Lorriane Dance, RN

## 2024-04-20 NOTE — ED Triage Notes (Addendum)
 Pt BIB [facility] from home d/t family calling & reporting that she has been reporting SI. [facility] transported her to ED with IVC & pt reports she has been drinking all night, not wanting to take her meds & refusing her CA Tx. States that she recently intentionally OD on her sleep meds & was brought to [facility] & her plan now is to hang herself if she gets her hands on a rope. Denies SI in triage  stating I wouldn't do bc I don't have a rope. Endorses ETOH use the last 2 days & smoking crack yesterday.

## 2024-04-20 NOTE — Consult Note (Cosign Needed Addendum)
 [facility] Consult Initial  Patient Name: .Tracie Atkins  MRN: [id number]  DOB: 08/15/1955  Consult Order details:  Orders (From admission, onward)     Start     Ordered   04/20/24 1352  CONSULT TO CALL ACT TEAM       Ordering Provider: Elnor Jayson LABOR, DO  Provider:  (Not yet assigned)  Question:  Reason for Consult?  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  Alcohol abuse with alcohol-induced mood disorder 3.  Suicidal ideation 4.  Cocaine abuse  Assessment  Tracie Atkins is a 68 y.o. female admitted: Presented to the EDfor 04/20/2024 11:12 AM for complaints of suicidal, IVC. She carries the psychiatric diagnoses of MDD anxiety and alcohol abuse and has a past medical history of metaplastic cancer.   Her current presentation of depression, and IVC findings of such reported suicidal ideations is most consistent with MDD. She meets criteria for psychiatric admission based on current symptomology.  Patient currently takes no outpatient psychotropic medications and no psychiatric services in place.  Currently on assessment patient is sitting in her bed eating.  She is disheveled and makes minimal eye contact.  She is calm, cooperative, and fairly attentive.  She asked where she was at and how she got here.  She remembers very little about last night and this morning.  She remembers the police getting to her home and bringing her to the hospital.  She admits to drinking heavily yesterday and today.  She minimizes her alcohol use and states she only drinks occasionally, roughly 2- 4LOCOS when she does drink. She does admit to smoking crack cocaine daily roughly a 20.  She does not remember making any  suicidal comments or calling any family members stating that she was going to hang herself.  She is also denying that she told the emergency room physician that she wanted to kill herself or that she wanted to hang herself this morning.  Patient was initially seen in the emergency room on Sunday with an accidental overdose of Tylenol  when asking if this was a suicide attempt she initially stated yes and they retracted and stated no.  She is not forthcoming she is guarded and withdrawn.  She endorses depression with feelings of helplessness, hopelessness, worthlessness, decreased appetite and sleep.  She reports not sleeping any last night.  She moved to the [place] area from [place]  originally to be with her sons but now feels like they want nothing to do with her.  She feels that their wives do not like her.  States, I do not have anyone.  She identifies no protective factors or reasons to live. She admits to not making her most recent cancer follow up. States, it is too scary.  She is currently denying suicidal ideations.  States that if she told anybody she was going to kill herself she must of said it when she was drunk.  She is also denying that she stopped taking her cancer medications and told her children she did stop as a lie.  However she reported that she did stop taking medications to the emergency room provider on admission. She  denies homicidal ideations.  She denies auditory hallucinations.  She endorses visual hallucinations of seeing spiders sometimes at night whenever she is laying in her bed.  She does not appear to be responding to internal/external stimuli.  She denies any current alcohol withdrawal symptoms.  She denies any history of alcohol withdrawal seizure or delirium tremens.  Patient asked if she could sign herself out.  Explained the process of involuntary commitment.  Explained to patient that she will be recommended for psychiatric treatment.  She is reluctant but  agreed.  Diagnoses:  Active Hospital problems: Active Problems:   Alcohol abuse with alcohol-induced mood disorder (HCC)   Suicidal ideation   Severe episode of recurrent major depressive disorder, without psychotic features (HCC)   Cocaine abuse (HCC)    Plan   ## Psychiatric Medication Recommendations:   Start seroquel  50 mg at bedtime for mood stabilization  Continue- Ciwa protocol    ## Medical Decision Making Capacity: Not specifically addressed in this encounter  ## Further Work-up:  -- None  -- most recent EKG on 04/20/2024 had QtC of 464 -- Pertinent labwork reviewed earlier this admission includes: CBC CMP glucose BAL 202 UDS ordered but not collected   ## Disposition:-- We recommend inpatient psychiatric hospitalization after medical hospitalization. Patient has been involuntarily committed on 04/20/2024.   ## Behavioral / Environmental: -Delirium Precautions: Delirium Interventions for Nursing and Staff: - RN to open blinds every AM. - To Bedside: Glasses, hearing aide, and pt's own shoes. Make available to patients. when possible and encourage use. - Encourage po fluids when appropriate, keep fluids within reach. - OOB to chair with meals. - Passive ROM exercises to all extremities with AM & PM care. - RN to assess orientation to person, time and place QAM and PRN. - Recommend extended visitation hours with familiar family/friends as feasible. - Staff to minimize disturbances at night. Turn off television when pt asleep or when not in use. or Utilize compassion and acknowledge the patient's experiences while setting clear and realistic expectations for care.    ## Safety and Observation Level:  - Based on my clinical evaluation, I estimate the patient to be at low risk of self harm in the current setting. - At this time, we recommend  1:1 Observation. This decision is based on my review of the chart including patient's history and current presentation, interview of the  patient, mental status examination, and consideration of suicide risk including evaluating suicidal ideation, plan, intent, suicidal or self-harm behaviors, risk factors, and protective factors. This judgment is based on our ability to directly address suicide risk, implement suicide prevention strategies, and develop a safety plan while the patient is in the clinical setting. Please contact our team if there is a concern that risk level has changed.  CSSR Risk Category:C-SSRS RISK CATEGORY: High Risk  Suicide Risk Assessment: Patient has following modifiable risk factors for suicide: untreated depression, under treated depression , medication noncompliance, active mental illness (to encompass adhd, tbi, mania, psychosis, trauma reaction), current symptoms: anxiety/panic, insomnia, impulsivity, anhedonia, hopelessness, triggering events, and pain, medical illness (ie new dx of cancer), which we are addressing by recommending psychiatric admission. Patient has following non-modifiable or demographic risk factors for suicide: history of suicide attempt and psychiatric hospitalization Patient has the following protective factors against suicide: Access to outpatient mental health care  Thank you for this consult request. Recommendations have been communicated to the primary team.  We will continue to follow while awaiting psychiatric placement at this  time.   Elveria VEAR Batter, NP       History of Present Illness  Relevant Aspects of Hospital ED Course:  Admitted on 10/15/2025or complaints of suicidal, IVC. She carries the psychiatric diagnoses of MDD anxiety and alcohol abuse per chart Bipolar Disorder and has a past medical history of metaplastic cancer.   Patient Report:  I was drunk when I said those things I am not suicidal.  IVC petitioner BH RT/counselor.  Per IVC findings, Respondent stated that she was supposed to die on Sunday, but the attempt failed. She has no desire to live  anymore, she has been diagnosed with stage IV cancer and has refused to continue treatment. Respondent last commitment was 2018 She has been diagnosed medically with depression Respondent told family she does not plan to hang herself, so family members, called Patent examiner for a welfare check.   Upon arrival, law enforcement discovered she is heavily intoxicated with both alcohol and drugs.   Respondent is not eating or sleeping.   Dr. Ninetta, PAULETTA PICKNEY is a 68 y.o. female with past medical history as below, significant for alcohol abuse, depression, metastatic cancer who presents to the ED with complaint of suicidal, IVC   Patient reports that she has been drinking heavily over the past few days, last alcohol consumption was early this morning.  She has also been utilizing crack cocaine.  She stopped taking her cancer related medications.  She reports that she wants to kill herself.  She reported feeling ember that she wanted to try to hang herself.  Family called for welfare check from local police and upon arrival to the residence patient reported suicidal thoughts, she appeared intoxicated.  She is brought to the ER for further evaluation.  Patient requesting help, reports that her family does not care for her anymore and that nobody would care if she died  Psych ROS:  Depression: Endorses Anxiety: Endorses Mania (lifetime and current): Denies Psychosis: (lifetime and current): Denies  Collateral information:  Attempted to contact Aspasia Rude son [phone] Trystin Hargrove [phone] that his mother called him this morning requesting help for alcohol use.  She had apparently already called his other brother and made suicidal threats.  He does acknowledge that she has been off of her psychotropic medications.  She is unsure when she has taken any he believes it may be years.   Review of Systems  Constitutional:  Negative for fever.  Respiratory:  Negative for cough  and shortness of breath.   Cardiovascular:  Negative for chest pain.  Neurological:  Negative for tremors.  Psychiatric/Behavioral:  Positive for depression. The patient is nervous/anxious and has insomnia.      Psychiatric and Social History  Psychiatric History:  Information collected from chart review and patient  Prev Dx/Sx: MDD anxiety and alcohol abuse, per chart Bipolar disorder Current Psych Provider: No current provider in place Home Meds (current): Denies taking any psychiatric medications Previous Med Trials: Trazodone , duloxetine , peppermint, Lexapro  Therapy: Denies  Prior Psych Hospitalization: Multiple Prior Self Harm: History of 3-4 suicide attempts via cutting wrist Prior Violence: Denies  Family Psych History: Denies Family Hx suicide: Denies  Social History:  Developmental Hx: Denies Educational Hx: 10th Occupational Hx: Unemployed receives SSI Legal Hx: Denies Living Situation: Lives alone Spiritual Hx: Denies Access to weapons/lethal means: Denies  Substance History Alcohol:  roughly 2-3 times per week  Type of alcohol : Drinks 2-4 locos Last Drink this morning Number of drinks per day -  2 History of alcohol withdrawal seizures denies History of DT.  Denies Tobacco denies Illicit drugs: Cocaine uses roughly 20 per day Prescription drug abuse: Denies Rehab hx: Residential treatment for crack cocaine and alcohol abuse in the past  Exam Findings  Physical Exam:  Vital Signs:  Temp:  [97.7 F (36.5 C)-98 F (36.7 C)] 97.7 F (36.5 C) (10/15 1305) Pulse Rate:  [97-100] 97 (10/15 1305) Resp:  [16] 16 (10/15 1305) BP: (97-124)/(70-97) 124/97 (10/15 1305) SpO2:  [97 %-100 %] 100 % (10/15 1305) Weight:  [86.2 kg] 86.2 kg (10/15 1136) Blood pressure (!) 124/97, pulse 97, temperature 97.7 F (36.5 C), temperature source Temporal, resp. rate 16, height 5' 4 (1.626 m), weight 86.2 kg, SpO2 100%. Body mass index is 32.61 kg/m.  Physical  Exam Constitutional:      Appearance: Normal appearance.  Pulmonary:     Effort: No respiratory distress.  Neurological:     Mental Status: She is alert and oriented to person, place, and time.  Psychiatric:        Attention and Perception: She is attentive.        Mood and Affect: Mood is anxious and depressed. Affect is flat.        Speech: Speech normal.        Behavior: Behavior is cooperative.        Thought Content: Thought content normal.        Cognition and Memory: Cognition normal.        Judgment: Judgment is impulsive.     Mental Status Exam: General Appearance: Disheveled  Orientation:  Other:  Self, year, president had to be oriented to place  Memory:  Immediate;   Poor Recent;   Fair Remote;   Fair  Concentration:  Concentration: Fair and Attention Span: Fair  Recall:  Fair  Attention  Fair  Eye Contact:  Fair  Speech:  Clear and Coherent and Normal Rate  Language:  Fair  Volume:  Normal  Mood: Depressed  Affect:  Flat  Thought Process:  Coherent  Thought Content:  Logical  Suicidal Thoughts:  No  Homicidal Thoughts:  No  Judgement:  Impaired  Insight:  Lacking  Psychomotor Activity:  Normal  Akathisia:  No  Fund of Knowledge:  Fair      Assets:  Manufacturing systems engineer Leisure Time Physical Health Resilience Social Support  Cognition:  WNL  ADL's:  Intact  AIMS (if indicated):        Other History   These have been pulled in through the EMR, reviewed, and updated if appropriate.  Family History:  The patient's family history is not on file.  Medical History: Past Medical History:  Diagnosis Date  . Breast cancer (HCC) 2023   left  . Chronic dental infection 8/189   multiple teeth removed and Post -op infection.  . Depression   . ETOH abuse   . Fatty liver   . Headache     Surgical History: Past Surgical History:  Procedure Laterality Date  . BREAST LUMPECTOMY WITH RADIOACTIVE SEED AND SENTINEL LYMPH NODE BIOPSY Left 01/15/2022    Procedure: LEFT BREAST SEED LOCALIZED LUMPECTOMY, LEFT SEED TARGETED AXILLARY LYMPH NODE BIOPSY, LEFT SENTINEL LYMPH NODE BIOPSY;  Surgeon: Aron Shoulders, MD;  Location: [facility] OR;  Service: General;  Laterality: Left;  . DENTAL SURGERY    . RADIOACTIVE SEED GUIDED EXCISIONAL BREAST BIOPSY Right 01/15/2022   Procedure: RIGHT BREAST SEED LOCALIZED EXCISIONAL BIOPSY;  Surgeon: Aron Shoulders, MD;  Location: [facility]  OR;  Service: General;  Laterality: Right;  . TUBAL LIGATION       Medications:   Current Facility-Administered Medications:  .  folic acid  (FOLVITE ) tablet 1 mg, 1 mg, Oral, Daily, Elnor Savant A, DO .  LORazepam  (ATIVAN ) tablet 1-4 mg, 1-4 mg, Oral, Q1H PRN **OR** LORazepam  (ATIVAN ) injection 1-4 mg, 1-4 mg, Intravenous, Q1H PRN, Elnor Savant A, DO .  multivitamin with minerals tablet 1 tablet, 1 tablet, Oral, Daily, Elnor Savant A, DO .  thiamine  (VITAMIN B1) tablet 100 mg, 100 mg, Oral, Daily **OR** thiamine  (VITAMIN B1) injection 100 mg, 100 mg, Intravenous, Daily, Elnor Savant A, DO  Current Outpatient Medications:  .  anastrozole  (ARIMIDEX ) 1 MG tablet, Take 1 tablet (1 mg total) by mouth daily., Disp: 90 tablet, Rfl: 3 .  escitalopram  (LEXAPRO ) 20 MG tablet, Take 1 tablet (20 mg total) by mouth daily., Disp: 30 tablet, Rfl: 1 .  lidocaine  (LIDODERM ) 5 %, Place 1 patch onto the skin daily. Remove & Discard patch within 12 hours or as directed by MD, Disp: 14 patch, Rfl: 0 .  meloxicam  (MOBIC ) 7.5 MG tablet, Take 1 tablet (7.5 mg total) by mouth daily., Disp: 30 tablet, Rfl: 1 .  methocarbamol  (ROBAXIN ) 500 MG tablet, Take 1 tablet (500 mg total) by mouth 2 (two) times daily., Disp: 20 tablet, Rfl: 0 .  montelukast (SINGULAIR) 10 MG tablet, Take 10 mg by mouth at bedtime., Disp: , Rfl:  .  ondansetron  (ZOFRAN ) 8 MG tablet, Take 1 tablet (8 mg total) by mouth every 8 (eight) hours as needed for nausea or vomiting., Disp: 30 tablet, Rfl: 2 .  pantoprazole  (PROTONIX ) 40 MG tablet, Take 40 mg  by mouth at bedtime., Disp: , Rfl:   Allergies: No Known Allergies  Elveria VEAR Batter, NP

## 2024-04-21 ENCOUNTER — Encounter: Payer: Self-pay | Admitting: Psychiatry

## 2024-04-21 ENCOUNTER — Other Ambulatory Visit: Payer: Self-pay

## 2024-04-21 ENCOUNTER — Inpatient Hospital Stay
Admission: AD | Admit: 2024-04-21 | Discharge: 2024-04-27 | DRG: 885 | Disposition: A | Discharge status: Home / Self Care | Source: Intra-hospital | Attending: Psychiatry | Admitting: Psychiatry

## 2024-04-21 DIAGNOSIS — F419 Anxiety disorder, unspecified: Secondary | ICD-10-CM | POA: Diagnosis present

## 2024-04-21 DIAGNOSIS — K760 Fatty (change of) liver, not elsewhere classified: Secondary | ICD-10-CM | POA: Diagnosis present

## 2024-04-21 DIAGNOSIS — Z9151 Personal history of suicidal behavior: Secondary | ICD-10-CM | POA: Diagnosis not present

## 2024-04-21 DIAGNOSIS — F10139 Alcohol abuse with withdrawal, unspecified: Secondary | ICD-10-CM | POA: Diagnosis present

## 2024-04-21 DIAGNOSIS — F332 Major depressive disorder, recurrent severe without psychotic features: Secondary | ICD-10-CM | POA: Diagnosis present

## 2024-04-21 DIAGNOSIS — G8929 Other chronic pain: Secondary | ICD-10-CM | POA: Diagnosis present

## 2024-04-21 DIAGNOSIS — Z79899 Other long term (current) drug therapy: Secondary | ICD-10-CM

## 2024-04-21 DIAGNOSIS — Z791 Long term (current) use of non-steroidal anti-inflammatories (NSAID): Secondary | ICD-10-CM

## 2024-04-21 DIAGNOSIS — Z853 Personal history of malignant neoplasm of breast: Secondary | ICD-10-CM

## 2024-04-21 DIAGNOSIS — Z79811 Long term (current) use of aromatase inhibitors: Secondary | ICD-10-CM | POA: Diagnosis not present

## 2024-04-21 DIAGNOSIS — R45851 Suicidal ideations: Secondary | ICD-10-CM | POA: Diagnosis present

## 2024-04-21 DIAGNOSIS — Z560 Unemployment, unspecified: Secondary | ICD-10-CM

## 2024-04-21 MED ORDER — THIAMINE MONONITRATE 100 MG PO TABS
100.0000 mg | ORAL_TABLET | Freq: Every day | ORAL | Status: DC
  Administered 2024-04-22 – 2024-04-27 (×6): 100 mg via ORAL
  Filled 2024-04-21 (×6): qty 1

## 2024-04-21 MED ORDER — LORAZEPAM 1 MG PO TABS: 1.0000 mg | ORAL_TABLET | ORAL | Status: AC | PRN

## 2024-04-21 MED ORDER — ANASTROZOLE 1 MG PO TABS
1.0000 mg | ORAL_TABLET | Freq: Every day | ORAL | Status: DC
  Administered 2024-04-21: 1 mg via ORAL
  Filled 2024-04-21: qty 1

## 2024-04-21 MED ORDER — ACETAMINOPHEN 325 MG PO TABS
650.0000 mg | ORAL_TABLET | Freq: Four times a day (QID) | ORAL | Status: DC | PRN
  Administered 2024-04-23 – 2024-04-27 (×5): 650 mg via ORAL
  Filled 2024-04-21 (×5): qty 2

## 2024-04-21 MED ORDER — FOLIC ACID 1 MG PO TABS
1.0000 mg | ORAL_TABLET | Freq: Every day | ORAL | Status: DC
  Administered 2024-04-22 – 2024-04-27 (×6): 1 mg via ORAL
  Filled 2024-04-21 (×6): qty 1

## 2024-04-21 MED ORDER — OLANZAPINE 5 MG PO TBDP: 5.0000 mg | ORAL_TABLET | Freq: Three times a day (TID) | ORAL | Status: DC | PRN

## 2024-04-21 MED ORDER — ESCITALOPRAM OXALATE 10 MG PO TABS
20.0000 mg | ORAL_TABLET | Freq: Every day | ORAL | Status: DC
  Administered 2024-04-21: 20 mg via ORAL
  Filled 2024-04-21: qty 2

## 2024-04-21 MED ORDER — ANASTROZOLE 1 MG PO TABS
1.0000 mg | ORAL_TABLET | Freq: Every day | ORAL | Status: DC
  Administered 2024-04-22 – 2024-04-27 (×6): 1 mg via ORAL
  Filled 2024-04-21 (×6): qty 1

## 2024-04-21 MED ORDER — THIAMINE HCL 100 MG/ML IJ SOLN: 100.0000 mg | Freq: Every day | INTRAMUSCULAR | Status: DC

## 2024-04-21 MED ORDER — ESCITALOPRAM OXALATE 10 MG PO TABS
20.0000 mg | ORAL_TABLET | Freq: Every day | ORAL | Status: DC
  Administered 2024-04-22 – 2024-04-27 (×6): 20 mg via ORAL
  Filled 2024-04-21 (×6): qty 2

## 2024-04-21 MED ORDER — MAGNESIUM HYDROXIDE 400 MG/5ML PO SUSP: 30.0000 mL | Freq: Every day | ORAL | Status: DC | PRN

## 2024-04-21 MED ORDER — OLANZAPINE 10 MG IM SOLR: 5.0000 mg | Freq: Three times a day (TID) | INTRAMUSCULAR | Status: DC | PRN

## 2024-04-21 MED ORDER — ALUM & MAG HYDROXIDE-SIMETH 200-200-20 MG/5ML PO SUSP: 30.0000 mL | ORAL | Status: DC | PRN

## 2024-04-21 MED ORDER — ADULT MULTIVITAMIN W/MINERALS CH
1.0000 | ORAL_TABLET | Freq: Every day | ORAL | Status: DC
  Administered 2024-04-22 – 2024-04-27 (×6): 1 via ORAL
  Filled 2024-04-21 (×6): qty 1

## 2024-04-21 MED ORDER — LORAZEPAM 2 MG/ML IJ SOLN: 1.0000 mg | INTRAMUSCULAR | Status: AC | PRN

## 2024-04-21 NOTE — ED Notes (Signed)
 [employer] called for update. RN told admission will be calling soon with update.

## 2024-04-21 NOTE — ED Notes (Addendum)
 Report given to Herminia RN of [employer] on pt. RN is contacted provider about pts cancer medications and plan going forward. [facility] RN will reach out when she has confirmed planning.

## 2024-04-21 NOTE — Group Note (Signed)
 Recreation Therapy Group Note   Group Topic:Emotion Expression  Group Date: 04/21/2024 Start Time: 1400 End Time: 1450 Facilitators: Celestia Jeoffrey BRAVO, LRT, CTRS Location: Courtyard  Group Description: Music Reminisce. LRT encouraged patients to think of their favorite song(s) that reminded them of a positive memory or time in their life. LRT encouraged patient to talk about that memory aloud to the group. LRT played the song through a speaker for all to hear. LRT and patients discussed how thinking of a positive memory or time in their life can be used as a coping skill in everyday life post discharge.   Goal Area(s) Addressed: Patient will increase verbal communication by conversing with peers. Patient will contribute to group discussion with minimal prompting. Patient will reminisce a positive memory or moment in their life.    Affect/Mood: N/A   Participation Level: Did not attend    Clinical Observations/Individualized Feedback: Patient was not on the unit at the time of group.   Plan: Continue to engage patient in RT group sessions 2-3x/week.   Jeoffrey BRAVO Celestia, LRT, CTRS 04/21/2024 4:38 PM

## 2024-04-21 NOTE — Group Note (Signed)
 Date:  04/21/2024 Time:  8:24 PM  Group Topic/Focus:  Wrap-Up Group:   The focus of this group is to help patients review their daily goal of treatment and discuss progress on daily workbooks.    Participation Level:  Did Not Attend  Beatris ONEIDA Hasten 04/21/2024, 8:24 PM

## 2024-04-21 NOTE — ED Provider Notes (Addendum)
  [place] EMERGENCY DEPARTMENT AT [facility] Observation Re-evaluation Note  Tracie Atkins is a 68 y.o. female, seen on rounds today.  Pt initially presented on 04/20/24 at 1023 to the ED for complaints of  Chief Complaint  Patient presents with   IVC: SI  Welfare check was called by family, and please family patient intoxicated at home, repeatedly has been utilizing cocaine and alcohol, and has discontinued her cancer medications.  IVC completed by prior team. PMHx: Alcohol use disorder, SI, depression, cocaine use disorder, breast cancer, pituitary mass, elevated BMI, HTN, HLD Currently, the patient is resting calmly.  Physical Exam  BP 115/70 (BP Location: Left Arm)   Pulse (!) 108   Temp 97.9 F (36.6 C) (Oral)   Resp 19   Ht 5' 4 (1.626 m)   Wt 86.2 kg   SpO2 99%   BMI 32.61 kg/m  Physical Exam General: NAD Lungs: Normal effort Psych: Currently calm  ED Course / MDM  EKG:EKG Interpretation Date/Time:  Wednesday April 20 2024 11:27:10 EDT Ventricular Rate:  100 PR Interval:  138 QRS Duration:  70 QT Interval:  360 QTC Calculation: 464 R Axis:   33  Text Interpretation: Normal sinus rhythm Normal ECG When compared with ECG of 28-Aug-2023 12:03, PREVIOUS ECG IS PRESENT Confirmed by Elnor Savant ([id number]) on 04/20/2024 2:00:35 PM  I have reviewed the labs performed to date as well as medications administered while in observation.  Recent changes in the last 24 hours include evaluation by psychiatry who recommended admission.  Was reportedly accepted to [facility]. Home medications: Reordered Diet: Ordered  Plan  Current plan is for will transfer patient when bed becomes available at OSH.  Bed became available at OSH, EMTALA completed, patient transferred.   Rogelia Jerilynn RAMAN, MD 04/21/24 [id number]    Rogelia Jerilynn RAMAN, MD 04/21/24 [phone]

## 2024-04-21 NOTE — Progress Notes (Signed)
   04/21/24 2000  Psych Admission Type (Psych Patients Only)  Admission Status Involuntary  Psychosocial Assessment  Patient Complaints Depression;Isolation  Eye Contact Brief  Facial Expression Sad  Affect Depressed;Sad  Speech Logical/coherent;Soft  Interaction Guarded;Isolative  Motor Activity Slow  Appearance/Hygiene In scrubs  Behavior Characteristics Appropriate to situation  Mood Depressed  Thought Process  Coherency Circumstantial  Content WDL  Delusions None reported or observed  Perception WDL  Hallucination None reported or observed  Judgment Poor  Confusion None  Danger to Self  Current suicidal ideation? Passive  Agreement Not to Harm Self Yes  Description of Agreement Verbal  Danger to Others  Danger to Others None reported or observed

## 2024-04-21 NOTE — ED Notes (Addendum)
 Report given to susan neal RN of [facility] .

## 2024-04-21 NOTE — ED Notes (Signed)
 Pt left in custody of sheriffs department for [facility] , property of phone, cord, pants and blouse returned to pt.

## 2024-04-21 NOTE — ED Notes (Signed)
 Morning VS updated for pt. Pt updated in transfer to facility later this morning.

## 2024-04-21 NOTE — Plan of Care (Signed)
 Patient is an involuntary admission from [facility] with MDD and thoughts of SI by hanging herself. Patient is currently being treated for cancer, and also having housing difficulties.  Son is her main support helping with bills and trying to help her with her current house that needs fixing up. She endorses passive SI and verbalizes safety while here. Patient got into the shower as soon as we finished her admission assessment. Patient denies HI, AVH and anxiety but endorses 10/10 depression.  No previous psych admissions and has no therapists. Will continue to monitor.

## 2024-04-21 NOTE — Progress Notes (Signed)
 Pt was accepted to [facility] 04/21/2024 Bed Assignment L31  Address: [address] [place], [place], [place] [address]  [facility] Fax: [phone]  Pt meets inpatient criteria per: Elveria Batter NP  Attending Physician will be Dr. Allyn Donnelly COME  Report can be called to: [phone]  Pt can arrive after: [facility] WILL UPDATE   Care Team notified: Elveria Batter NP, Medford Meiers RN    [place] Cory Rama, MSW, [facility] 04/21/2024 1:17 PM

## 2024-04-22 DIAGNOSIS — F332 Major depressive disorder, recurrent severe without psychotic features: Principal | ICD-10-CM

## 2024-04-22 MED ORDER — TRAZODONE HCL 50 MG PO TABS
50.0000 mg | ORAL_TABLET | Freq: Once | ORAL | Status: AC
  Administered 2024-04-22: 50 mg via ORAL
  Filled 2024-04-22: qty 1

## 2024-04-22 NOTE — H&P (Signed)
 Psychiatric Admission Assessment Adult  Patient Identification: Tracie Atkins MRN:  [id number] Date of Evaluation:  04/22/2024 Chief Complaint:  MDD (major depressive disorder), recurrent severe, without psychosis (HCC) [F33.2]   History of Present Illness: Tracie Atkins is a 68 y.o. female admitted: Presented to the EDfor 04/20/2024 11:12 AM for complaints of suicidal, IVC. She carries the psychiatric diagnoses of MDD anxiety and alcohol abuse and has a past medical history of metaplastic cancer. Patient is admitted to [facility] unit with Q15 min safety monitoring. Multidisciplinary team approach is offered. Medication management; group/milieu therapy is offered.   Patient reports having problems with alcohol for many years.  She reports she has ongoing depression due to multiple psychosocial stressors including diagnosis of stage IV cancer, chronic pain, unable to do her ADLs including cooking, cleaning, her children are distant from her.  She reports having appointments with her primary care and oncologist which she did not make it.  She reports being scared about her health situation and not having any support from family or friends.  She reports drinking alcohol on daily basis as she was feeling hopeless.  She reports having one fourth of vodka, 2 cans of glucose and 1 small beer on regular basis for the last 3 months.  She did acknowledge going through withdrawal symptoms.  She denies any history of withdrawal seizures or DTs.  She reports being intoxicated and sending messages to her family members about being suicidal.  She reports that she blacked out and does not recall the events that led up to the ED visit but reports Saturday her son came to check on her and called 911.  Patient endorses worsening depression, feeling hopeless and worthless, endorses anhedonia.  She reports poor appetite low energy and motivation.  She reports that she is able to sleep only when she takes her medications.   She reports having suicidal ideation only the day she texted her family.  When asked about current suicidal ideation she became tearful and did acknowledge having suicidal thoughts today.  She denies HI/intent/plan.  She rates her anxiety as 8 out of 10, 10 being the worst.  She denies having any panic attacks.  She denies current or recent episodes of mania/hypomania.  She denies nightmares or flashbacks.  She does report random dreams of her mom calling her to join her and her mom passed away.  Patient also acknowledges smoking cocaine for many years and last use was Tuesday $20 worth.  She denies current auditory/visual hallucinations  Total Time spent with patient: 1 hour Sleep  Sleep:Sleep: Fair  Past Psychiatric History:  Psychiatric History:  Information collected from patient/chart  Prev Dx/Sx: MDD anxiety and alcohol abuse, per chart Bipolar disorder Current Psych Provider: No current provider in place Home Meds (current): Denies taking any psychiatric medications Previous Med Trials: Trazodone , duloxetine , peppermint, Lexapro  Therapy: Denies   Prior Psych Hospitalization: Multiple Prior Self Harm: History of 3-4 suicide attempts via cutting wrist Prior Violence: Denies   Family Psych History: Denies Family Hx suicide: Denies   Social History:  Developmental Hx: Denies Educational Hx: 10th Occupational Hx: Unemployed receives SSI Legal Hx: Denies Living Situation: Lives alone, grandson lives in the house but is not present in the house most of the days, patient has a son Spiritual Hx: Denies Access to weapons/lethal means: Denies   Substance History Alcohol:  roughly 2-3 times per week  Type of alcohol : Drinks 2-4 locos Last Drink this morning Number of drinks per  day -2 History of alcohol withdrawal seizures denies History of DT.  Denies Tobacco denies Illicit drugs: Cocaine uses roughly 20 per day Prescription drug abuse: Denies Rehab hx: Residential treatment  for crack cocaine and alcohol abuse in the past Is the patient at risk to self? Yes.    Has the patient been a risk to self in the past 6 months? No.  Has the patient been a risk to self within the distant past? No.  Is the patient a risk to others? No.  Has the patient been a risk to others in the past 6 months? No.  Has the patient been a risk to others within the distant past? No.   [place] Scale:  Flowsheet Row Admission (Current) from 04/21/2024 in [facility] ED from 04/20/2024 in [facility] Emergency Department at [facility] ED from 04/17/2024 in [facility] Emergency Department at [facility]  C-SSRS RISK CATEGORY High Risk High Risk No Risk     Past Medical History:  Past Medical History:  Diagnosis Date   Breast cancer (HCC) 2023   left   Chronic dental infection 8/189   multiple teeth removed and Post -op infection.   Depression    ETOH abuse    Fatty liver    Headache     Past Surgical History:  Procedure Laterality Date   BREAST LUMPECTOMY WITH RADIOACTIVE SEED AND SENTINEL LYMPH NODE BIOPSY Left 01/15/2022   Procedure: LEFT BREAST SEED LOCALIZED LUMPECTOMY, LEFT SEED TARGETED AXILLARY LYMPH NODE BIOPSY, LEFT SENTINEL LYMPH NODE BIOPSY;  Surgeon: Aron Shoulders, MD;  Location: [facility] OR;  Service: General;  Laterality: Left;   DENTAL SURGERY     RADIOACTIVE SEED GUIDED EXCISIONAL BREAST BIOPSY Right 01/15/2022   Procedure: RIGHT BREAST SEED LOCALIZED EXCISIONAL BIOPSY;  Surgeon: Aron Shoulders, MD;  Location: [facility] OR;  Service: General;  Laterality: Right;   TUBAL LIGATION     Family History: History reviewed. No pertinent family history.  Social History:  Social History   Substance and Sexual Activity  Alcohol Use Yes   Alcohol/week: 1.0 standard drink of alcohol   Types: 1 Standard drinks or equivalent per week   Comment: 1 mixed (liquor) drink per week     Social History   Substance and Sexual Activity  Drug Use No       Allergies:  No Known Allergies Lab Results:  Results for orders placed or performed during the hospital encounter of 04/20/24 (from the past 48 hours)  Rapid urine drug screen (hospital performed)     Status: Abnormal   Collection Time: 04/20/24  4:46 PM  Result Value Ref Range   Opiates NONE DETECTED NONE DETECTED   Cocaine POSITIVE (A) NONE DETECTED   Benzodiazepines NONE DETECTED NONE DETECTED   Amphetamines NONE DETECTED NONE DETECTED   Tetrahydrocannabinol NONE DETECTED NONE DETECTED   Barbiturates NONE DETECTED NONE DETECTED    Comment: (NOTE) DRUG SCREEN FOR MEDICAL PURPOSES ONLY.  IF CONFIRMATION IS NEEDED FOR ANY PURPOSE, NOTIFY LAB WITHIN 5 DAYS.  LOWEST DETECTABLE LIMITS FOR URINE DRUG SCREEN Drug Class                     Cutoff (ng/mL) Amphetamine and metabolites    1000 Barbiturate and metabolites    200 Benzodiazepine                 200 Opiates and metabolites        300 Cocaine and metabolites  300 THC                            50 Performed at [facility] Lab, [address], [place], [place] [address]   Resp panel by RT-PCR (RSV, Flu A&B, Covid) Anterior Nasal Swab     Status: None   Collection Time: 04/20/24 10:52 PM   Specimen: Anterior Nasal Swab  Result Value Ref Range   SARS Coronavirus 2 by RT PCR NEGATIVE NEGATIVE   Influenza A by PCR NEGATIVE NEGATIVE   Influenza B by PCR NEGATIVE NEGATIVE    Comment: (NOTE) The Xpert Xpress SARS-CoV-2/FLU/RSV plus assay is intended as an aid in the diagnosis of influenza from Nasopharyngeal swab specimens and should not be used as a sole basis for treatment. Nasal washings and aspirates are unacceptable for Xpert Xpress SARS-CoV-2/FLU/RSV testing.  Fact Sheet for Patients: BloggerCourse.com  Fact Sheet for Healthcare Providers: SeriousBroker.it  This test is not yet approved or cleared by the [place]  FDA and has been authorized for  detection and/or diagnosis of SARS-CoV-2 by FDA under an Emergency Use Authorization (EUA). This EUA will remain in effect (meaning this test can be used) for the duration of the COVID-19 declaration under Section 564(b)(1) of the Act, 21 U.S.C. section 360bbb-3(b)(1), unless the authorization is terminated or revoked.     Resp Syncytial Virus by PCR NEGATIVE NEGATIVE    Comment: (NOTE) Fact Sheet for Patients: BloggerCourse.com  Fact Sheet for Healthcare Providers: SeriousBroker.it  This test is not yet approved or cleared by the [place]  FDA and has been authorized for detection and/or diagnosis of SARS-CoV-2 by FDA under an Emergency Use Authorization (EUA). This EUA will remain in effect (meaning this test can be used) for the duration of the COVID-19 declaration under Section 564(b)(1) of the Act, 21 U.S.C. section 360bbb-3(b)(1), unless the authorization is terminated or revoked.  Performed at [facility] Lab, [address] [place], [place] [address]     Blood Alcohol level:  Lab Results  Component Value Date   ETH 202 (H) 04/20/2024   ETH 44 (H) 04/17/2024    Metabolic Disorder Labs:  Lab Results  Component Value Date   HGBA1C 5.6 11/07/2022   MPG 114.02 11/07/2022   MPG 99.67 08/21/2021   Lab Results  Component Value Date   PROLACTIN 8.8 07/31/2022   Lab Results  Component Value Date   CHOL 241 (H) 09/28/2021   TRIG 158 (H) 09/28/2021   HDL 61 09/28/2021   CHOLHDL 4.0 09/28/2021   VLDL 32 09/28/2021   LDLCALC 148 (H) 09/28/2021   LDLCALC 55 08/20/2021    Current Medications: Current Facility-Administered Medications  Medication Dose Route Frequency Provider Last Rate Last Admin   acetaminophen  (TYLENOL ) tablet 650 mg  650 mg Oral Q6H PRN Mannie Jerel PARAS, NP       alum & mag hydroxide-simeth (MAALOX/MYLANTA) 200-200-20 MG/5ML suspension 30 mL  30 mL Oral Q4H PRN Mannie Jerel PARAS, NP        anastrozole  (ARIMIDEX ) tablet 1 mg  1 mg Oral Daily Mannie Jerel PARAS, NP   1 mg at 04/22/24 9166   escitalopram  (LEXAPRO ) tablet 20 mg  20 mg Oral Daily Mannie Jerel PARAS, NP   20 mg at 04/22/24 9165   folic acid  (FOLVITE ) tablet 1 mg  1 mg Oral Daily Mannie Jerel PARAS, NP   1 mg at 04/22/24 0834   LORazepam  (ATIVAN ) tablet 1-4  mg  1-4 mg Oral Q1H PRN Mannie Jerel PARAS, NP       Or   LORazepam  (ATIVAN ) injection 1-4 mg  1-4 mg Intravenous Q1H PRN Mannie Jerel PARAS, NP       magnesium  hydroxide (MILK OF MAGNESIA) suspension 30 mL  30 mL Oral Daily PRN Mannie Jerel PARAS, NP       multivitamin with minerals tablet 1 tablet  1 tablet Oral Daily Mannie Jerel PARAS, NP   1 tablet at 04/22/24 9166   OLANZapine  (ZYPREXA ) injection 5 mg  5 mg Intramuscular TID PRN Mannie Jerel PARAS, NP       OLANZapine  zydis (ZYPREXA ) disintegrating tablet 5 mg  5 mg Oral TID PRN Mannie Jerel PARAS, NP       thiamine  (VITAMIN B1) tablet 100 mg  100 mg Oral Daily Mannie Jerel PARAS, NP   100 mg at 04/22/24 9166   Or   thiamine  (VITAMIN B1) injection 100 mg  100 mg Intravenous Daily Mannie Jerel PARAS, NP       PTA Medications: Medications Prior to Admission  Medication Sig Dispense Refill Last Dose/Taking   anastrozole  (ARIMIDEX ) 1 MG tablet Take 1 tablet (1 mg total) by mouth daily. 90 tablet 3    escitalopram  (LEXAPRO ) 20 MG tablet Take 1 tablet (20 mg total) by mouth daily. 30 tablet 1    lidocaine  (LIDODERM ) 5 % Place 1 patch onto the skin daily. Remove & Discard patch within 12 hours or as directed by MD 14 patch 0    meloxicam  (MOBIC ) 7.5 MG tablet Take 1 tablet (7.5 mg total) by mouth daily. 30 tablet 1    methocarbamol  (ROBAXIN ) 500 MG tablet Take 1 tablet (500 mg total) by mouth 2 (two) times daily. 20 tablet 0    montelukast (SINGULAIR) 10 MG tablet Take 10 mg by mouth at bedtime.      ondansetron  (ZOFRAN ) 8 MG tablet Take 1 tablet (8 mg total) by mouth every 8 (eight) hours as needed for nausea or vomiting. 30 tablet 2     pantoprazole  (PROTONIX ) 40 MG tablet Take 40 mg by mouth at bedtime.       Psychiatric Specialty Exam:  Presentation  General Appearance:  Appropriate for Environment  Eye Contact: Fleeting  Speech: Normal Rate  Speech Volume: Normal    Mood and Affect  Mood: Dysphoric; Depressed  Affect: Depressed; Flat   Thought Process  Thought Processes: Coherent  Descriptions of Associations:Intact  Orientation:Full (Time, Place and Person)  Thought Content:Logical  Hallucinations:Hallucinations: None  Ideas of Reference:None  Suicidal Thoughts:Suicidal Thoughts: Yes, Passive SI Passive Intent and/or Plan: With Intent; Without Plan  Homicidal Thoughts:Homicidal Thoughts: No   Sensorium  Memory: Immediate Fair; Recent Fair; Remote Fair  Judgment: Impaired  Insight: Shallow   Executive Functions  Concentration: Fair  Attention Span: Fair  Recall: [employer] of Knowledge: Fair  Language: Fair   Psychomotor Activity  Psychomotor Activity: Psychomotor Activity: Normal   Assets  Assets: Communication Skills; Desire for Improvement; Physical Health    Musculoskeletal: Strength & Muscle Tone: within normal limits Gait & Station: normal  Physical Exam: Physical Exam Vitals and nursing note reviewed.  HENT:     Head: Normocephalic.     Nose: Nose normal.  Cardiovascular:     Rate and Rhythm: Normal rate.     Pulses: Normal pulses.  Pulmonary:     Effort: Pulmonary effort is normal.  Neurological:     Mental Status: She is alert.  Review of Systems  Constitutional: Negative.   HENT: Negative.    Eyes: Negative.   Cardiovascular: Negative.   Gastrointestinal: Negative.   Skin: Negative.    Blood pressure 115/64, pulse (!) 106, temperature (!) 97.5 F (36.4 C), resp. rate 18, height 5' 4 (1.626 m), weight 86 kg, SpO2 99%. Body mass index is 32.53 kg/m.  Principal Diagnosis: MDD (major depressive disorder), recurrent  severe, without psychosis (HCC) Diagnosis:  Principal Problem:   MDD (major depressive disorder), recurrent severe, without psychosis (HCC) Alcohol use disorder  Clinical Decision Making: Patient is currently admitted for worsening symptoms of depression and suicidal ideation in the context of worsening alcohol use, multiple medical problems including stage IV cancer.  Patient had stopped all her medications and treatments with intention to die.  Patient needs close monitoring in the inpatient psychiatric unit.  Treatment Plan Summary:  Safety and Monitoring:             -- Voluntary admission to inpatient psychiatric unit for safety, stabilization and treatment             -- Daily contact with patient to assess and evaluate symptoms and progress in treatment             -- Patient's case to be discussed in multi-disciplinary team meeting             -- Observation Level: q15 minute checks             -- Vital signs:  q12 hours             -- Precautions: suicide, elopement, and assault   2. Psychiatric Diagnoses and Treatment:              Lexapro  20 mg daily     -- The risks/benefits/side-effects/alternatives to this medication were discussed in detail with the patient and time was given for questions. The patient consents to medication trial.                -- Metabolic profile and EKG monitoring obtained while on an atypical antipsychotic (BMI: Lipid Panel: HbgA1c: QTc:)              -- Encouraged patient to participate in unit milieu and in scheduled group therapies                            3. Medical Issues Being Addressed:   Alcohol withdrawals: COWA and Ativan  protocol   4. Discharge Planning:              -- Social work and case management to assist with discharge planning and identification of hospital follow-up needs prior to discharge             -- Estimated LOS: 5-7 days             -- Discharge Concerns: Need to establish a safety plan; Medication compliance and  effectiveness             -- Discharge Goals: Return home with outpatient referrals follow ups  Physician Treatment Plan for Primary Diagnosis: MDD (major depressive disorder), recurrent severe, without psychosis (HCC) Long Term Goal(s): Improvement in symptoms so as ready for discharge  Short Term Goals: Ability to identify changes in lifestyle to reduce recurrence of condition will improve, Ability to verbalize feelings will improve, Ability to disclose and discuss suicidal ideas, and Ability to demonstrate self-control will improve  Physician Treatment  Plan for Secondary Diagnosis: Principal Problem:   MDD (major depressive disorder), recurrent severe, without psychosis (HCC)  Long Term Goal(s): Improvement in symptoms so as ready for discharge  Short Term Goals: Ability to identify changes in lifestyle to reduce recurrence of condition will improve, Ability to verbalize feelings will improve, Ability to disclose and discuss suicidal ideas, Ability to demonstrate self-control will improve, Ability to identify and develop effective coping behaviors will improve, Ability to maintain clinical measurements within normal limits will improve, Compliance with prescribed medications will improve, and Ability to identify triggers associated with substance abuse/mental health issues will improve  I certify that inpatient services furnished can reasonably be expected to improve the patient's condition.    Malene Blaydes, MD 10/17/20253:28 PM

## 2024-04-22 NOTE — Plan of Care (Signed)
  Problem: Activity: Goal: Interest or engagement in activities will improve Outcome: Progressing   Problem: Physical Regulation: Goal: Ability to maintain clinical measurements within normal limits will improve Outcome: Progressing   Problem: Safety: Goal: Periods of time without injury will increase Outcome: Progressing

## 2024-04-22 NOTE — [facility] Suicide Risk Assessment (Signed)
 [facility] Admission Suicide Risk Assessment   Nursing information obtained from:  Patient Demographic factors:  Age 68 or older, Caucasian, NA Current Mental Status:  Self-harm thoughts Loss Factors:  Decline in physical health Historical Factors:  NA Risk Reduction Factors:  Sense of responsibility to family  Total Time spent with patient: 30 minutes Principal Problem: MDD (major depressive disorder), recurrent severe, without psychosis (HCC) Diagnosis:  Principal Problem:   MDD (major depressive disorder), recurrent severe, without psychosis (HCC)  Subjective Data: Tracie Atkins is a 68 y.o. female admitted: Presented to the EDfor 04/20/2024 11:12 AM for complaints of suicidal, IVC. She carries the psychiatric diagnoses of MDD anxiety and alcohol abuse and has a past medical history of metaplastic cancer. Patient is admitted to [facility] unit with Q15 min safety monitoring. Multidisciplinary team approach is offered. Medication management; group/milieu therapy is offered.   Continued Clinical Symptoms:  Alcohol Use Disorder Identification Test Final Score (AUDIT): 1 The Alcohol Use Disorders Identification Test, Guidelines for Use in Primary Care, Second Edition.  World Science writer [facility]). Score between 0-7:  no or low risk or alcohol related problems. Score between 8-15:  moderate risk of alcohol related problems. Score between 16-19:  high risk of alcohol related problems. Score 20 or above:  warrants further diagnostic evaluation for alcohol dependence and treatment.   CLINICAL FACTORS:   Depression:   Comorbid alcohol abuse/dependence   Musculoskeletal: Strength & Muscle Tone: within normal limits Gait & Station: normal Patient leans: N/A  Psychiatric Specialty Exam:  Presentation  General Appearance:  Appropriate for Environment  Eye Contact: Fleeting  Speech: Normal Rate  Speech Volume: Normal  Handedness: Right   Mood and Affect  Mood: Dysphoric;  Depressed  Affect: Depressed; Flat   Thought Process  Thought Processes: Coherent  Descriptions of Associations:Intact  Orientation:Full (Time, Place and Person)  Thought Content:Logical  History of Schizophrenia/Schizoaffective disorder:No  Duration of Psychotic Symptoms:No data recorded Hallucinations:Hallucinations: None  Ideas of Reference:None  Suicidal Thoughts:Suicidal Thoughts: Yes, Passive SI Passive Intent and/or Plan: With Intent; Without Plan  Homicidal Thoughts:Homicidal Thoughts: No   Sensorium  Memory: Immediate Fair; Recent Fair; Remote Fair  Judgment: Impaired  Insight: Shallow   Executive Functions  Concentration: Fair  Attention Span: Fair  Recall: [employer] of Knowledge: Fair  Language: Fair   Psychomotor Activity  Psychomotor Activity: Psychomotor Activity: Normal   Assets  Assets: Communication Skills; Desire for Improvement; Physical Health   Sleep  Sleep: Sleep: Fair    Physical Exam: Physical Exam ROS Blood pressure 115/64, pulse (!) 106, temperature (!) 97.5 F (36.4 C), resp. rate 18, height 5' 4 (1.626 m), weight 86 kg, SpO2 99%. Body mass index is 32.53 kg/m.   COGNITIVE FEATURES THAT CONTRIBUTE TO RISK:  None    SUICIDE RISK:   Minimal: No identifiable suicidal ideation.  Patients presenting with no risk factors but with morbid ruminations; may be classified as minimal risk based on the severity of the depressive symptoms  PLAN OF CARE: Patient is admitted to [facility] psych unit with Q15 min safety monitoring. Multidisciplinary team approach is offered. Medication management; group/milieu therapy is offered.   I certify that inpatient services furnished can reasonably be expected to improve the patient's condition.   Allyn Foil, MD 04/22/2024, 3:27 PM

## 2024-04-22 NOTE — Group Note (Signed)
 Date:  04/22/2024 Time:  8:46 PM  Group Topic/Focus:  Goals Group:   The focus of this group is to help patients establish daily goals to achieve during treatment and discuss how the patient can incorporate goal setting into their daily lives to aide in recovery.    Participation Level:  Active  Participation Quality:  Appropriate  Affect:  Appropriate  Cognitive:  Appropriate  Insight: Good  Engagement in Group:  Engaged  Modes of Intervention:  Discussion  Additional Comments:    Tracie Atkins 04/22/2024, 8:46 PM

## 2024-04-22 NOTE — BH IP Treatment Plan (Signed)
 Interdisciplinary Treatment and Diagnostic Plan Update  04/22/2024 Time of Session: 11:03 AM Tracie Atkins MRN: [id number]  Principal Diagnosis: MDD (major depressive disorder), recurrent severe, without psychosis (HCC)  Secondary Diagnoses: Principal Problem:   MDD (major depressive disorder), recurrent severe, without psychosis (HCC)   Current Medications:  Current Facility-Administered Medications  Medication Dose Route Frequency Provider Last Rate Last Admin   acetaminophen  (TYLENOL ) tablet 650 mg  650 mg Oral Q6H PRN Mannie Jerel PARAS, NP       alum & mag hydroxide-simeth (MAALOX/MYLANTA) 200-200-20 MG/5ML suspension 30 mL  30 mL Oral Q4H PRN Mannie Jerel PARAS, NP       anastrozole  (ARIMIDEX ) tablet 1 mg  1 mg Oral Daily Mannie Jerel PARAS, NP   1 mg at 04/22/24 9166   escitalopram  (LEXAPRO ) tablet 20 mg  20 mg Oral Daily Mannie Jerel PARAS, NP   20 mg at 04/22/24 9165   folic acid  (FOLVITE ) tablet 1 mg  1 mg Oral Daily Mannie Jerel PARAS, NP   1 mg at 04/22/24 9165   LORazepam  (ATIVAN ) tablet 1-4 mg  1-4 mg Oral Q1H PRN Mannie Jerel PARAS, NP       Or   LORazepam  (ATIVAN ) injection 1-4 mg  1-4 mg Intravenous Q1H PRN Mannie Jerel PARAS, NP       magnesium  hydroxide (MILK OF MAGNESIA) suspension 30 mL  30 mL Oral Daily PRN Mannie Jerel PARAS, NP       multivitamin with minerals tablet 1 tablet  1 tablet Oral Daily Mannie Jerel PARAS, NP   1 tablet at 04/22/24 9166   OLANZapine  (ZYPREXA ) injection 5 mg  5 mg Intramuscular TID PRN Mannie Jerel PARAS, NP       OLANZapine  zydis (ZYPREXA ) disintegrating tablet 5 mg  5 mg Oral TID PRN Mannie Jerel PARAS, NP       thiamine  (VITAMIN B1) tablet 100 mg  100 mg Oral Daily Mannie Jerel PARAS, NP   100 mg at 04/22/24 9166   Or   thiamine  (VITAMIN B1) injection 100 mg  100 mg Intravenous Daily Mannie Jerel PARAS, NP       PTA Medications: Medications Prior to Admission  Medication Sig Dispense Refill Last Dose/Taking   anastrozole  (ARIMIDEX ) 1 MG tablet Take 1  tablet (1 mg total) by mouth daily. 90 tablet 3    escitalopram  (LEXAPRO ) 20 MG tablet Take 1 tablet (20 mg total) by mouth daily. 30 tablet 1    lidocaine  (LIDODERM ) 5 % Place 1 patch onto the skin daily. Remove & Discard patch within 12 hours or as directed by MD 14 patch 0    meloxicam  (MOBIC ) 7.5 MG tablet Take 1 tablet (7.5 mg total) by mouth daily. 30 tablet 1    methocarbamol  (ROBAXIN ) 500 MG tablet Take 1 tablet (500 mg total) by mouth 2 (two) times daily. 20 tablet 0    montelukast (SINGULAIR) 10 MG tablet Take 10 mg by mouth at bedtime.      ondansetron  (ZOFRAN ) 8 MG tablet Take 1 tablet (8 mg total) by mouth every 8 (eight) hours as needed for nausea or vomiting. 30 tablet 2    pantoprazole  (PROTONIX ) 40 MG tablet Take 40 mg by mouth at bedtime.       Patient Stressors:    Patient Strengths:    Treatment Modalities: Medication Management, Group therapy, Case management,  1 to 1 session with clinician, Psychoeducation, Recreational therapy.   Physician Treatment Plan for Primary Diagnosis: MDD (major depressive disorder),  recurrent severe, without psychosis (HCC) Long Term Goal(s):     Short Term Goals:    Medication Management: Evaluate patient's response, side effects, and tolerance of medication regimen.  Therapeutic Interventions: 1 to 1 sessions, Unit Group sessions and Medication administration.  Evaluation of Outcomes: Not Met  Physician Treatment Plan for Secondary Diagnosis: Principal Problem:   MDD (major depressive disorder), recurrent severe, without psychosis (HCC)  Long Term Goal(s):     Short Term Goals:       Medication Management: Evaluate patient's response, side effects, and tolerance of medication regimen.  Therapeutic Interventions: 1 to 1 sessions, Unit Group sessions and Medication administration.  Evaluation of Outcomes: Not Met   RN Treatment Plan for Primary Diagnosis: MDD (major depressive disorder), recurrent severe, without psychosis  (HCC) Long Term Goal(s): Knowledge of disease and therapeutic regimen to maintain health will improve  Short Term Goals: Ability to verbalize frustration and anger appropriately will improve, Ability to demonstrate self-control, Ability to participate in decision making will improve, Ability to verbalize feelings will improve, Ability to disclose and discuss suicidal ideas, and Ability to identify and develop effective coping behaviors will improve  Medication Management: RN will administer medications as ordered by provider, will assess and evaluate patient's response and provide education to patient for prescribed medication. RN will report any adverse and/or side effects to prescribing provider.  Therapeutic Interventions: 1 on 1 counseling sessions, Psychoeducation, Medication administration, Evaluate responses to treatment, Monitor vital signs and CBGs as ordered, Perform/monitor CIWA, COWS, AIMS and Fall Risk screenings as ordered, Perform wound care treatments as ordered.  Evaluation of Outcomes: Not Met   LCSW Treatment Plan for Primary Diagnosis: MDD (major depressive disorder), recurrent severe, without psychosis (HCC) Long Term Goal(s): Safe transition to appropriate next level of care at discharge, Engage patient in therapeutic group addressing interpersonal concerns.  Short Term Goals: Engage patient in aftercare planning with referrals and resources, Increase social support, Increase ability to appropriately verbalize feelings, Increase emotional regulation, Facilitate acceptance of mental health diagnosis and concerns, Facilitate patient progression through stages of change regarding substance use diagnoses and concerns, Identify triggers associated with mental health/substance abuse issues, and Increase skills for wellness and recovery  Therapeutic Interventions: Assess for all discharge needs, 1 to 1 time with Social worker, Explore available resources and support systems, Assess  for adequacy in community support network, Educate family and significant other(s) on suicide prevention, Complete Psychosocial Assessment, Interpersonal group therapy.  Evaluation of Outcomes: Not Met   Progress in Treatment: Attending groups: No. Participating in groups: No. Taking medication as prescribed: Yes. Toleration medication: Yes. Family/Significant other contact made: No, will contact:  CSW to contact once permission is granted.  Patient understands diagnosis: Yes. Discussing patient identified problems/goals with staff: Yes. Medical problems stabilized or resolved: Yes. and No. Denies suicidal/homicidal ideation: Yes. Issues/concerns per patient self-inventory: No. Other: None  New problem(s) identified: No, Describe:  None  New Short Term/Long Term Goal(s):detox, elimination of symptoms of psychosis, medication management for mood stabilization; elimination of SI thoughts; development of comprehensive mental wellness/sobriety plan.    Patient Goals:  I just want to try to get some help.  Discharge Plan or Barriers: CSW to assist with the development of appropriate discharge plan.    Reason for Continuation of Hospitalization: Anxiety Depression Medication stabilization Suicidal ideation Withdrawal symptoms  Estimated Length of Stay: 1-7 days.   Last 3 [place] Suicide Severity Risk Score: Flowsheet Row Admission (Current) from 04/21/2024 in [facility] BE[facility]IORAL [facility]IC[facility] ED  from 04/20/2024 in [facility] Emergency Department at [facility] ED from 04/17/2024 in [facility] Emergency Department at [facility]  C-SSRS RISK CATEGORY High Risk High Risk No Risk    Last [facility] 2/9 Scores:    11/04/2023    8:41 AM 11/06/2022   11:37 PM 09/28/2021    9:45 PM  Depression screen PHQ 2/9  Decreased Interest 2 2 2   Down, Depressed, Hopeless 2 3 3   PHQ - 2 Score 4 5 5   Altered sleeping 3 3 1   Tired, decreased energy 3 3 1   Change in appetite 3 3 1    Feeling bad or failure about yourself  3 3 3   Trouble concentrating 2 2 1   Moving slowly or fidgety/restless 2 2 0  Suicidal thoughts 0 1 2  PHQ-9 Score 20 22 14   Difficult doing work/chores Very difficult Very difficult Extremely dIfficult    Scribe for Treatment Team: Alveta CHRISTELLA Kerns, LCSW 04/22/2024 11:01 AM
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
## 2024-04-22 NOTE — Plan of Care (Signed)
  Problem: Education: Goal: Verbalization of understanding the information provided will improve Outcome: Progressing   Problem: Activity: Goal: Interest or engagement in activities will improve Outcome: Progressing   Problem: Health Behavior/Discharge Planning: Goal: Compliance with treatment plan for underlying cause of condition will improve Outcome: Progressing   Problem: Education: Goal: Emotional status will improve Outcome: Not Progressing

## 2024-04-22 NOTE — Group Note (Signed)
 Recreation Therapy Group Note   Group Topic:Leisure Education  Group Date: 04/22/2024 Start Time: 1500 End Time: 1600 Facilitators: Celestia Jeoffrey BRAVO, LRT, CTRS Location: Courtyard  Group Description: Music. Patients are encouraged to name their favorite song(s) for LRT to play song through speaker for group to hear, while in the courtyard getting fresh air and sunlight. Patients educated on the definition of leisure and the importance of having different leisure interests outside of the hospital. Group discussed how leisure activities can often be used as Pharmacologist and that listening to music and being outside are examples.    Goal Area(s) Addressed:  Patient will identify a current leisure interest.  Patient will practice making a positive decision. Patient will have the opportunity to try a new leisure activity.   Affect/Mood: N/A   Participation Level: Did not attend    Clinical Observations/Individualized Feedback: Patient did not attend group.   Plan: Continue to engage patient in RT group sessions 2-3x/week.   Jeoffrey BRAVO Celestia, LRT, CTRS 04/22/2024 4:59 PM

## 2024-04-22 NOTE — Progress Notes (Signed)
   04/22/24 1945  Psych Admission Type (Psych Patients Only)  Admission Status Involuntary  Psychosocial Assessment  Patient Complaints Anxiety;Depression;Hopelessness;Helplessness;Loneliness  Eye Contact Brief  Facial Expression Sad  Affect Depressed;Sad  Speech Logical/coherent  Interaction Assertive  Motor Activity Slow  Appearance/Hygiene In scrubs  Behavior Characteristics Cooperative;Appropriate to situation  Mood Depressed;Pleasant  Thought Process  Coherency Circumstantial  Content WDL  Delusions None reported or observed  Perception WDL  Hallucination None reported or observed  Judgment Poor  Confusion None  Danger to Self  Current suicidal ideation? Passive  Agreement Not to Harm Self Yes  Description of Agreement Verbal  Danger to Others  Danger to Others None reported or observed

## 2024-04-22 NOTE — Group Note (Signed)
 Date:  04/22/2024 Time:  11:17 AM  Group Topic/Focus:  Diagnosis Education:   The focus of this group is to discuss the major disorders that patients maybe diagnosed with.  Group discusses the importance of knowing what one's diagnosis is so that one can understand treatment and better advocate for oneself. Emotional Education:   The focus of this group is to discuss what feelings/emotions are, and how they are experienced. Managing Feelings:   The focus of this group is to identify what feelings patients have difficulty handling and develop a plan to handle them in a healthier way upon discharge.    Participation Level:  Did Not Attend   Tracie Atkins 04/22/2024, 11:17 AM

## 2024-04-22 NOTE — Progress Notes (Signed)
(  Sleep Hours) - 10.25  (Any PRNs that were needed, meds refused, or side effects to meds)- n/a  (Any disturbances and when (visitation, over night)- n/a  (Concerns raised by the patient)-  n/a  (SI/HI/AVH)- passive si, denies HI/AVH

## 2024-04-22 NOTE — Group Note (Signed)
 Physical/Occupational Therapy Group Note  Group Topic: UE Therex   Group Date: 04/22/2024 Start Time: 1305 End Time: 1350 Facilitators: Clive Warren CROME, OT    Group Description: Group instructed in series of upper extremities exercises, aimed to promote strength, flexibility, range of motion and functional endurance.  Patients provided cuing for proper mechanics and proper pace of exercise; exercises adjusted as necessary for individualized patient needs.  Patient also engaged in cognitive components throughout session, working to integrate attention to task, command following, turn-taking and appropriate social interaction throughout session.  Allowed to ask questions as appropriate, and encouraged to identify specific exercises that they could complete independently outside of group sessions.   Therapeutic Goal(s): Demonstrate appropriate performance of upper extremity exercises to promote strength, flexibility, range of motion and functional endurance Identify 2-3 specific upper extremity exercises to complete as home exercise program outside of group session   Individual Participation: Pt present but selected to sit and watch other participants complete the exercises. She did review the handout and appeared to follow along at times. Only engaged briefly when prompted. Kept handout at the end of the session.  Participation Level: Non-verbal and Minimal   Participation Quality: Independent   Behavior: Calm, Disinterested, On-looking, and Passive   Speech/Thought Process: Relevant   Affect/Mood: Flat and Stable    Insight: Did not participate enough to provide assessment   Judgement: Did not participate enough to provide assessment   Modes of Intervention: Activity, Clarification, Discussion, Education, Exploration, Problem-solving, Rapport Building, Socialization, and Support  Patient Response to Interventions:  Disengaged and Receptive   Plan: Continue to engage patient in PT/OT  groups 1 - 2x/week.  Charron Coultas R., MPH, MS, OTR/L ascom [phone] 04/22/24, 1:59 PM

## 2024-04-22 NOTE — Plan of Care (Signed)
   Problem: Education: Goal: Emotional status will improve Outcome: Not Progressing Goal: Mental status will improve Outcome: Not Progressing   Problem: Activity: Goal: Interest or engagement in activities will improve Outcome: Not Progressing

## 2024-04-22 NOTE — Progress Notes (Signed)
 Behavior:  Pleasant and cooperative.     Psych assessment:  Endorses passive SI, depression, loneliness, hopelessness and helplessness.  Verbally contracts for safety on the unit.   Interaction / Group attendance:  Present in the milieu.  Appropriate, but minimal interaction with peers and staff.   Medication/ PRNs: Compliant  Pain: Denies  15 min checks in place for safety.

## 2024-04-23 MED ORDER — TRAZODONE HCL 50 MG PO TABS
50.0000 mg | ORAL_TABLET | Freq: Every evening | ORAL | Status: DC | PRN
  Administered 2024-04-23 – 2024-04-26 (×4): 50 mg via ORAL
  Filled 2024-04-23 (×4): qty 1

## 2024-04-23 MED ORDER — LORAZEPAM 2 MG/ML IJ SOLN: 1.0000 mg | INTRAMUSCULAR | Status: DC | PRN

## 2024-04-23 MED ORDER — LORAZEPAM 1 MG PO TABS: 1.0000 mg | ORAL_TABLET | ORAL | Status: DC | PRN

## 2024-04-23 MED ORDER — GABAPENTIN 300 MG PO CAPS
300.0000 mg | ORAL_CAPSULE | Freq: Three times a day (TID) | ORAL | Status: DC
  Administered 2024-04-23 – 2024-04-27 (×13): 300 mg via ORAL
  Filled 2024-04-23 (×13): qty 1

## 2024-04-23 NOTE — Plan of Care (Signed)
  Problem: Education: Goal: Mental status will improve Outcome: Progressing   

## 2024-04-23 NOTE — Group Note (Signed)
 Date:  04/23/2024 Time:  10:03 PM  Group Topic/Focus:  Healthy Communication:   The focus of this group is to discuss communication, barriers to communication, as well as healthy ways to communicate with others.    Participation Level:  Active  Participation Quality:  Appropriate  Affect:  Appropriate  Cognitive:  Appropriate  Insight: Good  Engagement in Group:  Engaged  Modes of Intervention:  Discussion  Additional Comments:    Tracie Atkins 04/23/2024, 10:03 PM

## 2024-04-23 NOTE — Group Note (Signed)
 Date:  04/23/2024 Time:  4:19 PM  Group Topic/Focus:  Personal Choices and Values:   The focus of this group is to help patients assess and explore the importance of values in their lives, how their values affect their decisions, how they express their values and what opposes their expression.    Participation Level:  Active  Participation Quality:  Appropriate  Affect:  Appropriate  Cognitive:  Appropriate  Insight: Appropriate  Engagement in Group:  Engaged  Modes of Intervention:  Discussion   Arland Nutting 04/23/2024, 4:19 PM

## 2024-04-23 NOTE — Group Note (Signed)
 Date:  04/23/2024 Time:  12:47 PM  Group Topic/Focus:  Conflict Resolution:   The focus of this group is to discuss the conflict resolution process and how it may be used upon discharge. Personal Choices and Values:   The focus of this group is to help patients assess and explore the importance of values in their lives, how their values affect their decisions, how they express their values and what opposes their expression. Stages of Change:   The focus of this group is to explain the stages of change and help patients identify changes they want to make upon discharge.    Participation Level:  Active  Participation Quality:  Appropriate  Affect:  Appropriate  Cognitive:  Appropriate  Insight: Appropriate  Engagement in Group:  Engaged  Modes of Intervention:  Activity  Additional Comments:  N/A  Tracie Atkins Gavel 04/23/2024, 12:47 PM

## 2024-04-23 NOTE — Progress Notes (Addendum)
 Notified MD Madaram that CIWA PRN medications expire at 1500 today. Verbal to renew orders and to place Neurontin  300mg  TID for ETOH

## 2024-04-23 NOTE — Progress Notes (Signed)
   04/23/24 1000  Psych Admission Type (Psych Patients Only)  Admission Status Involuntary  Psychosocial Assessment  Patient Complaints None  Eye Contact Fair  Facial Expression Sad  Affect Depressed  Speech Logical/coherent  Interaction Superficial  Motor Activity Slow  Appearance/Hygiene In scrubs  Behavior Characteristics Cooperative  Mood Pleasant  Thought Process  Coherency WDL  Content WDL  Delusions None reported or observed  Perception WDL  Hallucination None reported or observed  Judgment Impaired  Confusion None  Danger to Self  Current suicidal ideation? Denies

## 2024-04-23 NOTE — Progress Notes (Signed)
 Subjective: Patient seen today for follow-up. She continues to endorse feelings of depression and anxiety. She was tearful during the interview and reported ongoing passive suicidal ideation, though she denied any current plan or intent. Patient reports fair sleep, low energy, and continued poor appetite. She acknowledges ongoing psychosocial stressors, including her stage IV cancer diagnosis, lack of support system, and chronic pain.  Patient reports that she had been using alcohol daily (approximately  bottle of vodka, 2 cans of glucose drink, and 1 beer daily for the past 3 months) but currently denies withdrawal symptoms. She did report experiencing mild withdrawal symptoms earlier. She denies history of seizures or delirium tremens. She also reports a history of cocaine use with the last use being $20 worth on Tuesday.  She denies hallucinations, panic attacks, mania/hypomania, or PTSD-related symptoms.  Objective:  Appearance: Disheveled but cooperative  Mood: Depressed  Affect: Tearful, congruent  Speech: Normal rate and tone  Thought process: Linear, goal-directed  Thought content: No delusions, denies active SI/HI/AVH  Insight/Judgment: Limited  Cognition: Alert and oriented x3  Vitals: BP elevated today -- [insert actual BP if available]  Assessment: 68 year old female with history of MDD, anxiety, alcohol use disorder, and stage IV metaplastic cancer admitted following suicidal ideation and intoxication. Continues to endorse passive SI with low mood and poor support system. She appears to be experiencing mild alcohol withdrawal symptoms but is currently stable. No acute psychosis or mania. CIWA monitoring is appropriate.  Plan:  Continue Q15 safety checks.  Renew CIWA protocol for continued monitoring of alcohol withdrawal.  Initiate Neurontin  (Gabapentin ) 300 mg PO TID for mood stabilization and withdrawal support.  Monitor for side effects or sedation.  Continue  multidisciplinary approach: medication management, group and milieu therapy.  Monitor blood pressure, consider medical consult if persistently elevated.  Encourage engagement in therapy and support groups.  Assess for possible re-initiation of antidepressant therapy once stabilized.  Coordinate with case management for family support or placement needs if applicable   Emerly Prak M.D.

## 2024-04-23 NOTE — Group Note (Unsigned)
 Date:  04/23/2024 Time:  11:20 AM  Group Topic/Focus:  Managing Feelings:   The focus of this group is to identify what feelings patients have difficulty handling and develop a plan to handle them in a healthier way upon discharge. Rediscovering Joy:   The focus of this group is to explore various ways to relieve stress in a positive manner.     Participation Level:  {BHH PARTICIPATION OZCZO:77735}  Participation Quality:  {BHH PARTICIPATION QUALITY:22265}  Affect:  {BHH AFFECT:22266}  Cognitive:  {BHH COGNITIVE:22267}  Insight: {BHH Insight2:20797}  Engagement in Group:  {BHH ENGAGEMENT IN HMNLE:77731}  Modes of Intervention:  {BHH MODES OF INTERVENTION:22269}  Additional Comments:  ***  Tracie Atkins 04/23/2024, 11:20 AM

## 2024-04-24 NOTE — Progress Notes (Signed)
 Subjective:Patient reports that she feels like she has bladder retention blood pressure was slightly high Patient seen today for follow-up. She continues to endorse feelings of depression and anxiety. She was tearful during the interview and reported ongoing passive suicidal ideation, though she denied any current plan or intent. Patient reports fair sleep, low energy, and continued poor appetite. She acknowledges ongoing psychosocial stressors, including her stage IV cancer diagnosis, lack of support system, and chronic pain.  Patient reports that she had been using alcohol daily (approximately  bottle of vodka, 2 cans of glucose drink, and 1 beer daily for the past 3 months) but currently denies withdrawal symptoms. She did report experiencing mild withdrawal symptoms earlier. She denies history of seizures or delirium tremens. She also reports a history of cocaine use with the last use being $20 worth on Tuesday.  She denies hallucinations, panic attacks, mania/hypomania, or PTSD-related symptoms.  Objective:  Appearance: Disheveled but cooperative  Mood: Depressed  Affect: Tearful, congruent  Speech: Normal rate and tone  Thought process: Linear, goal-directed  Thought content: No delusions, denies active SI/HI/AVH  Insight/Judgment: Limited  Cognition: Alert and oriented x3  Vitals: BP elevated today -- [insert actual BP if available]  Assessment: 68 year old female with history of MDD, anxiety, alcohol use disorder, and stage IV metaplastic cancer admitted following suicidal ideation and intoxication. Continues to endorse passive SI with low mood and poor support system. She appears to be experiencing mild alcohol withdrawal symptoms but is currently stable. No acute psychosis or mania. CIWA monitoring is appropriate.  Plan: Monitor bladder retention issues and look for urology consult if needed Continue Q15 safety checks.  Renew CIWA protocol for continued monitoring of  alcohol withdrawal.  Initiate Neurontin  (Gabapentin ) 300 mg PO TID for mood stabilization and withdrawal support.  Monitor for side effects or sedation.  Continue multidisciplinary approach: medication management, group and milieu therapy.  Monitor blood pressure, consider medical consult if persistently elevated.  Encourage engagement in therapy and support groups.  Assess for possible re-initiation of antidepressant therapy once stabilized.  Coordinate with case management for family support or placement needs if applicable   Terra Aveni M.D.

## 2024-04-24 NOTE — Group Note (Signed)
 Date:  04/24/2024 Time:  10:34 AM  Group Topic/Focus:  Coping With Mental Health Crisis:   The purpose of this group is to help patients identify strategies for coping with mental health crisis.  Group discusses possible causes of crisis and ways to manage them effectively.    Participation Level:  Active  Participation Quality:  Appropriate  Affect:  Appropriate  Cognitive:  Appropriate  Insight: Appropriate  Engagement in Group:  Engaged  Modes of Intervention:  Discussion  Additional Comments:    Leigh VEAR Pais 04/24/2024, 10:34 AM

## 2024-04-24 NOTE — Group Note (Signed)
 [facility] LCSW Group Therapy Note   Group Date: 04/24/2024 Start Time: 1430 End Time: 1530   Type of Therapy and Topic: Group Therapy: Avoiding Self-Sabotaging and Enabling Behaviors  Participation Level: Did Not Attend  Mood:  Description of Group:  In this group, patients will learn how to identify obstacles, self-sabotaging and enabling behaviors, as well as: what are they, why do we do them and what needs these behaviors meet. Discuss unhealthy relationships and how to have positive healthy boundaries with those that sabotage and enable. Explore aspects of self-sabotage and enabling in yourself and how to limit these self-destructive behaviors in everyday life.   Therapeutic Goals: 1. Patient will identify one obstacle that relates to self-sabotage and enabling behaviors 2. Patient will identify one personal self-sabotaging or enabling behavior they did prior to admission 3. Patient will state a plan to change the above identified behavior 4. Patient will demonstrate ability to communicate their needs through discussion and/or role play.    Summary of Patient Progress:   x   Therapeutic Modalities:  Cognitive Behavioral Therapy Person-Centered Therapy Motivational Interviewing    Rexene LELON Mae, LCSWA

## 2024-04-24 NOTE — Progress Notes (Addendum)
 Patient presents with flat affect and reports poor sleep last night. Patient denied SI,HI, and A/V/H with no plan or intent this morning but did report pain in my bladder. Patient denies any burning while urinating or any other symptoms. Patient stated the pain started today. Provider notified. Patient denied any s/s of withdrawal and is med compliant. This afternoon, patient reported a headache and received tylenol  po prn. Patient reported wanting to discharge and signing herself out but IVC status explained to patient. Patient verbalized understanding and remains cooperative in unit.

## 2024-04-24 NOTE — Plan of Care (Signed)
?  Problem: Education: ?Goal: Emotional status will improve ?Outcome: Progressing ?Goal: Mental status will improve ?Outcome: Progressing ?Goal: Verbalization of understanding the information provided will improve ?Outcome: Progressing ?  ?Problem: Activity: ?Goal: Sleeping patterns will improve ?Outcome: Progressing ?  ?Problem: Coping: ?Goal: Ability to demonstrate self-control will improve ?Outcome: Progressing ?  ?

## 2024-04-24 NOTE — Plan of Care (Signed)
  Problem: Health Behavior/Discharge Planning: Goal: Compliance with treatment plan for underlying cause of condition will improve Outcome: Progressing   Problem: Safety: Goal: Periods of time without injury will increase Outcome: Progressing   Problem: Activity: Goal: Interest or engagement in leisure activities will improve Outcome: Progressing

## 2024-04-24 NOTE — Progress Notes (Signed)
   04/23/24 2359  Psych Admission Type (Psych Patients Only)  Admission Status Involuntary  Psychosocial Assessment  Patient Complaints Insomnia  Eye Contact Fair  Facial Expression Other (Comment)  Affect Sullen  Speech Logical/coherent  Interaction Guarded  Motor Activity Slow  Appearance/Hygiene In scrubs  Behavior Characteristics Cooperative  Mood Pleasant  Thought Process  Coherency WDL  Content WDL  Delusions None reported or observed  Perception WDL  Hallucination None reported or observed  Judgment Impaired  Confusion None  Danger to Self  Current suicidal ideation? Denies  Self-Injurious Behavior No self-injurious ideation or behavior indicators observed or expressed   Agreement Not to Harm Self Yes  Description of Agreement verbal  Danger to Others  Danger to Others None reported or observed    Estimated Sleeping Duration (Last 24 Hours): 5.75-8.00 hours

## 2024-04-24 NOTE — Group Note (Signed)
 Date:  04/24/2024 Time:  9:48 PM  Group Topic/Focus:  Wrap-Up Group:   The focus of this group is to help patients review their daily goal of treatment and discuss progress on daily workbooks.    Participation Level:  Active  Participation Quality:  Attentive  Affect:  Appropriate  Cognitive:  Alert  Insight: Appropriate  Engagement in Group:  Engaged  Modes of Intervention:  Discussion  Additional Comments:    Joni Norrod K 04/24/2024, 9:48 PM

## 2024-04-25 NOTE — Group Note (Signed)
 Date:  04/25/2024 Time:  5:38 PM  Group Topic/Focus:  Self Care:   The focus of this group is to help patients understand the importance of self-care in order to improve or restore emotional, physical, spiritual, interpersonal, and financial health.    Participation Level:  Active  Participation Quality:  Appropriate  Affect:  Appropriate  Cognitive:  Appropriate  Insight: Improving  Engagement in Group:  Engaged  Modes of Intervention:  Discussion  Additional Comments:    Tracie Atkins 04/25/2024, 5:38 PM

## 2024-04-25 NOTE — Progress Notes (Signed)
 [facility] MD Progress Note  04/25/2024 11:49 PM Tracie Atkins  MRN:  [id number]  Tracie Atkins is a 68 y.o. female admitted: Presented to the EDfor 04/20/2024 11:12 AM for complaints of suicidal, IVC. She carries the psychiatric diagnoses of MDD anxiety and alcohol abuse and has a past medical history of metaplastic cancer. Patient is admitted to [facility] unit with Q15 min safety monitoring. Multidisciplinary team approach is offered. Medication management; group/milieu therapy is offered.  Elena, Tracie Atkins (Son) [phone] (Mobile)  Subjective:  Chart reviewed, case discussed in multidisciplinary meeting, patient seen during rounds.  Patient is seen today in the day room.  She remains discharge focused.  She reports feeling better and wants to go home.  She reports that her weekend was good.  She denies any withdrawal symptoms from alcohol.  Patient lacks insight into her mental health problems and substance use problems.  She reports that she will not drink and will follow-up with her oncology appointments.  Patient was encouraged to make calls and get appointments while she is on the inpatient facility.  Patient denies SI/HI/plan and denies auditory/visual hallucinations.  Sleep: Fair  Appetite:  Fair  Past Psychiatric History: see h&P Family History: History reviewed. No pertinent family history. Social History:  Social History   Substance and Sexual Activity  Alcohol Use Yes   Alcohol/week: 1.0 standard drink of alcohol   Types: 1 Standard drinks or equivalent per week   Comment: 1 mixed (liquor) drink per week     Social History   Substance and Sexual Activity  Drug Use No    Social History   Socioeconomic History   Marital status: Single    Spouse name: Not on file   Number of children: 3   Years of education: Not on file   Highest education level: Not on file  Occupational History   Not on file  Tobacco Use   Smoking status: Never    Passive exposure: Never   Smokeless  tobacco: Never  Vaping Use   Vaping status: Never Used  Substance and Sexual Activity   Alcohol use: Yes    Alcohol/week: 1.0 standard drink of alcohol    Types: 1 Standard drinks or equivalent per week    Comment: 1 mixed (liquor) drink per week   Drug use: No   Sexual activity: Never  Other Topics Concern   Not on file  Social History Narrative   Not on file   Social Drivers of Health   Financial Resource Strain: Not on file  Food Insecurity: No Food Insecurity (04/21/2024)   Hunger Vital Sign    Worried About Running Out of Food in the Last Year: Never true    Ran Out of Food in the Last Year: Never true  Transportation Needs: No Transportation Needs (04/21/2024)   PRAPARE - Administrator, Civil Service (Medical): No    Lack of Transportation (Non-Medical): No  Physical Activity: Not on file  Stress: Not on file  Social Connections: Patient Declined (04/21/2024)   Social Connection and Isolation Panel    Frequency of Communication with Friends and Family: Patient declined    Frequency of Social Gatherings with Friends and Family: Patient declined    Attends Religious Services: Patient declined    Database administrator or Organizations: Patient declined    Attends Banker Meetings: Patient declined    Marital Status: Patient declined   Past Medical History:  Past Medical History:  Diagnosis Date  Breast cancer (HCC) 2023   left   Chronic dental infection 8/189   multiple teeth removed and Post -op infection.   Depression    ETOH abuse    Fatty liver    Headache     Past Surgical History:  Procedure Laterality Date   BREAST LUMPECTOMY WITH RADIOACTIVE SEED AND SENTINEL LYMPH NODE BIOPSY Left 01/15/2022   Procedure: LEFT BREAST SEED LOCALIZED LUMPECTOMY, LEFT SEED TARGETED AXILLARY LYMPH NODE BIOPSY, LEFT SENTINEL LYMPH NODE BIOPSY;  Surgeon: Aron Shoulders, MD;  Location: [facility] OR;  Service: General;  Laterality: Left;   DENTAL SURGERY      RADIOACTIVE SEED GUIDED EXCISIONAL BREAST BIOPSY Right 01/15/2022   Procedure: RIGHT BREAST SEED LOCALIZED EXCISIONAL BIOPSY;  Surgeon: Aron Shoulders, MD;  Location: [facility] OR;  Service: General;  Laterality: Right;   TUBAL LIGATION      Current Medications: Current Facility-Administered Medications  Medication Dose Route Frequency Provider Last Rate Last Admin   acetaminophen  (TYLENOL ) tablet 650 mg  650 mg Oral Q6H PRN Mannie Jerel PARAS, NP   650 mg at 04/25/24 2007   alum & mag hydroxide-simeth (MAALOX/MYLANTA) 200-200-20 MG/5ML suspension 30 mL  30 mL Oral Q4H PRN Mannie Jerel PARAS, NP       anastrozole  (ARIMIDEX ) tablet 1 mg  1 mg Oral Daily Mannie Jerel PARAS, NP   1 mg at 04/25/24 9072   escitalopram  (LEXAPRO ) tablet 20 mg  20 mg Oral Daily Mannie Jerel PARAS, NP   20 mg at 04/25/24 9072   folic acid  (FOLVITE ) tablet 1 mg  1 mg Oral Daily Mannie Jerel PARAS, NP   1 mg at 04/25/24 9072   gabapentin  (NEURONTIN ) capsule 300 mg  300 mg Oral TID Madaram, Kondal R, MD   300 mg at 04/25/24 2112   magnesium  hydroxide (MILK OF MAGNESIA) suspension 30 mL  30 mL Oral Daily PRN Mannie Jerel PARAS, NP       multivitamin with minerals tablet 1 tablet  1 tablet Oral Daily Mannie Jerel PARAS, NP   1 tablet at 04/25/24 9073   OLANZapine  (ZYPREXA ) injection 5 mg  5 mg Intramuscular TID PRN Mannie Jerel PARAS, NP       OLANZapine  zydis (ZYPREXA ) disintegrating tablet 5 mg  5 mg Oral TID PRN Mannie Jerel PARAS, NP       thiamine  (VITAMIN B1) tablet 100 mg  100 mg Oral Daily Mannie Jerel PARAS, NP   100 mg at 04/25/24 9072   Or   thiamine  (VITAMIN B1) injection 100 mg  100 mg Intravenous Daily Mannie Jerel PARAS, NP       traZODone  (DESYREL ) tablet 50 mg  50 mg Oral QHS PRN Byungura, Veronique M, NP   50 mg at 04/25/24 2131    Lab Results: No results found for this or any previous visit (from the past 48 hours).  Blood Alcohol level:  Lab Results  Component Value Date   ETH 202 (H) 04/20/2024   ETH 44 (H) 04/17/2024     Metabolic Disorder Labs: Lab Results  Component Value Date   HGBA1C 5.6 11/07/2022   MPG 114.02 11/07/2022   MPG 99.67 08/21/2021   Lab Results  Component Value Date   PROLACTIN 8.8 07/31/2022   Lab Results  Component Value Date   CHOL 241 (H) 09/28/2021   TRIG 158 (H) 09/28/2021   HDL 61 09/28/2021   CHOLHDL 4.0 09/28/2021   VLDL 32 09/28/2021   LDLCALC 148 (H) 09/28/2021   LDLCALC 55 08/20/2021  Physical Findings: AIMS:  , ,  ,  ,    CIWA:  CIWA-Ar Total: 0 COWS:      Psychiatric Specialty Exam:  Presentation  General Appearance:  Appropriate for Environment  Eye Contact: Fleeting  Speech: Normal Rate  Speech Volume: Normal    Mood and Affect  Mood: Dysphoric; Depressed  Affect: Depressed; Flat   Thought Process  Thought Processes: Coherent  Descriptions of Associations:Intact  Orientation:Full (Time, Place and Person)  Thought Content:Logical  Hallucinations: Denies Ideas of Reference:None  Suicidal Thoughts: Denies Homicidal Thoughts: Denies plan  Sensorium  Memory: Immediate Fair; Recent Fair; Remote Fair  Judgment: Impaired  Insight: Shallow   Executive Functions  Concentration: Fair  Attention Span: Fair  Recall: Fair  Fund of Knowledge: Fair  Language: Fair   Psychomotor Activity  Psychomotor Activity:No data recorded Musculoskeletal: Strength & Muscle Tone: within normal limits Gait & Station: normal Assets  Assets: Manufacturing systems engineer; Desire for Improvement; Physical Health    Physical Exam: Physical Exam ROS Blood pressure (!) 114/58, pulse (!) 107, temperature (!) 97.3 F (36.3 C), resp. rate 16, height 5' 4 (1.626 m), weight 86 kg, SpO2 100%. Body mass index is 32.53 kg/m.  Diagnosis: Principal Problem:   MDD (major depressive disorder), recurrent severe, without psychosis (HCC)   Clinical Decision Making: Patient is currently admitted for worsening symptoms of depression and  suicidal ideation in the context of worsening alcohol use, multiple medical problems including stage IV cancer.  Patient had stopped all her medications and treatments with intention to die.  Patient needs close monitoring in the inpatient psychiatric unit.  Treatment Plan Summary:  Safety and Monitoring:             -- Involuntary admission to inpatient psychiatric unit for safety, stabilization and treatment             -- Daily contact with patient to assess and evaluate symptoms and progress in treatment             -- Patient's case to be discussed in multi-disciplinary team meeting             -- Observation Level: q15 minute checks             -- Vital signs:  q12 hours             -- Precautions: suicide, elopement, and assault   2. Psychiatric Diagnoses and Treatment:              Lexapro  20 mg daily     -- The risks/benefits/side-effects/alternatives to this medication were discussed in detail with the patient and time was given for questions. The patient consents to medication trial.                -- Metabolic profile and EKG monitoring obtained while on an atypical antipsychotic (BMI: Lipid Panel: HbgA1c: QTc:)              -- Encouraged patient to participate in unit milieu and in scheduled group therapies                            3. Medical Issues Being Addressed:   4. Discharge Planning:   -- Social work and case management to assist with discharge planning and identification of hospital follow-up needs prior to discharge  -- Estimated LOS: 3-4 days  Allyn Foil, MD 04/25/2024, 11:49 PM

## 2024-04-25 NOTE — Group Note (Signed)
 Recreation Therapy Group Note   Group Topic:Other  Group Date: 04/25/2024 Start Time: 1400 End Time: 1440 Facilitators: Celestia Jeoffrey BRAVO, LRT, CTRS Location: Dayroom  Activity Description/Intervention: Therapeutic Drumming. Patients with peers and staff were given the opportunity to engage in a leader facilitated [facility] Group Empowerment Drumming Circle with staff from the [employer], in partnership with [employer]. Teaching laboratory technician and trained [employer], Norleen Mon leading with LRT observing and documenting intervention and pt response. This evidenced-based practice targets 7 areas of health and wellbeing in the human experience including: stress-reduction, exercise, self-expression, camaraderie/support, nurturing, spirituality, and music-making (leisure).    Goal Area(s) Addresses:  Patient will engage in pro-social way in music group.  Patient will follow directions of drum leader on the first prompt. Patient will demonstrate no behavioral issues during group.  Patient will identify if a reduction in stress level occurs as a result of participation in therapeutic drum circle.      Affect/Mood: Appropriate, Blunted, and Flat   Participation Level: Moderate   Participation Quality: Independent   Behavior: Cooperative   Speech/Thought Process: Coherent   Insight: Limited   Judgement: Limited   Modes of Intervention: Music   Patient Response to Interventions:  Receptive   Education Outcome:  In group clarification offered    Clinical Observations/Individualized Feedback: Tracie Atkins was somewhat active in their participation of session activities and group discussion.   Plan: Continue to engage patient in RT group sessions 2-3x/week.   Jeoffrey BRAVO Celestia, LRT, CTRS 04/25/2024 4:26 PM

## 2024-04-25 NOTE — Group Note (Signed)
 Date:  04/25/2024 Time:  11:24 AM  Group Topic/Focus:  Managing Feelings:   The focus of this group is to identify what feelings patients have difficulty handling and develop a plan to handle them in a healthier way upon discharge.    Participation Level:  Active  Participation Quality:  Attentive  Affect:  Appropriate  Cognitive:  Appropriate  Insight: Appropriate  Engagement in Group:  Engaged  Modes of Intervention:  Activity  Additional Comments:  N/A  Harlene LITTIE Gavel 04/25/2024, 11:24 AM

## 2024-04-25 NOTE — Group Note (Unsigned)
 Date:  04/25/2024 Time:  5:35 PM  Group Topic/Focus:  Self Care:   The focus of this group is to help patients understand the importance of self-care in order to improve or restore emotional, physical, spiritual, interpersonal, and financial health.     Participation Level:  {BHH PARTICIPATION OZCZO:77735}  Participation Quality:  {BHH PARTICIPATION QUALITY:22265}  Affect:  {BHH AFFECT:22266}  Cognitive:  {BHH COGNITIVE:22267}  Insight: {BHH Insight2:20797}  Engagement in Group:  {BHH ENGAGEMENT IN HMNLE:77731}  Modes of Intervention:  {BHH MODES OF INTERVENTION:22269}  Additional Comments:  ***  Tracie Atkins Daring 04/25/2024, 5:35 PM

## 2024-04-25 NOTE — Progress Notes (Signed)
   04/24/24 2000  Psych Admission Type (Psych Patients Only)  Admission Status Voluntary/72 hour document signed  Psychosocial Assessment  Patient Complaints Insomnia  Eye Contact Brief  Facial Expression Flat  Affect Flat  Speech Logical/coherent  Interaction Assertive  Motor Activity Slow  Appearance/Hygiene In scrubs  Behavior Characteristics Cooperative;Appropriate to situation  Mood Pleasant  Thought Process  Coherency WDL  Content WDL  Delusions None reported or observed  Perception WDL  Hallucination None reported or observed  Judgment Poor  Confusion None  Danger to Self  Current suicidal ideation? Denies  Self-Injurious Behavior No self-injurious ideation or behavior indicators observed or expressed   Agreement Not to Harm Self Yes  Description of Agreement Verbal  Danger to Others  Danger to Others None reported or observed

## 2024-04-25 NOTE — Plan of Care (Signed)
   Problem: Education: Goal: Emotional status will improve Outcome: Progressing Goal: Mental status will improve Outcome: Progressing   Problem: Activity: Goal: Interest or engagement in activities will improve Outcome: Progressing

## 2024-04-25 NOTE — Progress Notes (Signed)
 Behavior:  Pleasant and cooperative.     Psych assessment:  Endorses sadness.  Denies SI/HI and AVH.    Interaction / Group attendance:  Present in the milieu.  Appropriate interaction with peers and staff.  Attends groups.    Medication/ PRNs: Compliant. PRN pain medication given as ordered.   Pain: 9/10 back  15 min checks in place for safety.

## 2024-04-25 NOTE — Plan of Care (Signed)
  Problem: Education: Goal: Emotional status will improve Outcome: Progressing Goal: Verbalization of understanding the information provided will improve Outcome: Not Progressing

## 2024-04-26 MED ORDER — MONTELUKAST SODIUM 10 MG PO TABS
10.0000 mg | ORAL_TABLET | Freq: Every day | ORAL | Status: DC
Start: 2024-04-26 — End: 2024-04-27
  Administered 2024-04-26: 10 mg via ORAL
  Filled 2024-04-26: qty 1

## 2024-04-26 NOTE — Progress Notes (Signed)
 [facility] MD Progress Note  04/26/2024 12:33 PM Tracie Atkins  MRN:  [id number]  Tracie Atkins is a 68 y.o. female admitted: Presented to the EDfor 04/20/2024 11:12 AM for complaints of suicidal, IVC. She carries the psychiatric diagnoses of MDD anxiety and alcohol abuse and has a past medical history of metaplastic cancer. Patient is admitted to [facility] unit with Q15 min safety monitoring. Multidisciplinary team approach is offered. Medication management; group/milieu therapy is offered.  Tracie, Atkins (Son) [phone] (Mobile)  Collateral information-called son Tracie Atkins-generic voicemail-left HIPAA compliant voicemail Subjective:  Chart reviewed, case discussed in multidisciplinary meeting, patient seen during rounds.  Patient is seen today in the day area talking to other peers.  She reports having fair appetite and sleep.  She denies any withdrawal symptoms from cocaine or alcohol today.  Per nursing patient is taking her medications with no reported side effects.  Patient denies feeling depressed or anxious.  Patient is willing to participate in outpatient mental health resources.  Patient denies auditory/visual hallucinations.  Patient remains discharge focused.  Provider discussed the plan to talk to patient's family and do the safety education about firearms and other safety measures.  Patient reports that her first goal of her discharge will be to follow-up with her primary care and oncology physician to proceed with her treatments.  Provider also discussed getting her outpatient mental health appointments for follow-up.  Sleep: Fair  Appetite:  Fair  Past Psychiatric History: see h&P Family History: History reviewed. No pertinent family history. Social History:  Social History   Substance and Sexual Activity  Alcohol Use Yes   Alcohol/week: 1.0 standard drink of alcohol   Types: 1 Standard drinks or equivalent per week   Comment: 1 mixed (liquor) drink per week     Social History    Substance and Sexual Activity  Drug Use No    Social History   Socioeconomic History   Marital status: Single    Spouse name: Not on file   Number of children: 3   Years of education: Not on file   Highest education level: Not on file  Occupational History   Not on file  Tobacco Use   Smoking status: Never    Passive exposure: Never   Smokeless tobacco: Never  Vaping Use   Vaping status: Never Used  Substance and Sexual Activity   Alcohol use: Yes    Alcohol/week: 1.0 standard drink of alcohol    Types: 1 Standard drinks or equivalent per week    Comment: 1 mixed (liquor) drink per week   Drug use: No   Sexual activity: Never  Other Topics Concern   Not on file  Social History Narrative   Not on file   Social Drivers of Health   Financial Resource Strain: Not on file  Food Insecurity: No Food Insecurity (04/21/2024)   Hunger Vital Sign    Worried About Running Out of Food in the Last Year: Never true    Ran Out of Food in the Last Year: Never true  Transportation Needs: No Transportation Needs (04/21/2024)   PRAPARE - Administrator, Civil Service (Medical): No    Lack of Transportation (Non-Medical): No  Physical Activity: Not on file  Stress: Not on file  Social Connections: Patient Declined (04/21/2024)   Social Connection and Isolation Panel    Frequency of Communication with Friends and Family: Patient declined    Frequency of Social Gatherings with Friends and Family: Patient declined  Attends Religious Services: Patient declined    Active Member of Clubs or Organizations: Patient declined    Attends Banker Meetings: Patient declined    Marital Status: Patient declined   Past Medical History:  Past Medical History:  Diagnosis Date   Breast cancer (HCC) 2023   left   Chronic dental infection 8/189   multiple teeth removed and Post -op infection.   Depression    ETOH abuse    Fatty liver    Headache     Past Surgical  History:  Procedure Laterality Date   BREAST LUMPECTOMY WITH RADIOACTIVE SEED AND SENTINEL LYMPH NODE BIOPSY Left 01/15/2022   Procedure: LEFT BREAST SEED LOCALIZED LUMPECTOMY, LEFT SEED TARGETED AXILLARY LYMPH NODE BIOPSY, LEFT SENTINEL LYMPH NODE BIOPSY;  Surgeon: Aron Shoulders, MD;  Location: [facility] OR;  Service: General;  Laterality: Left;   DENTAL SURGERY     RADIOACTIVE SEED GUIDED EXCISIONAL BREAST BIOPSY Right 01/15/2022   Procedure: RIGHT BREAST SEED LOCALIZED EXCISIONAL BIOPSY;  Surgeon: Aron Shoulders, MD;  Location: [facility] OR;  Service: General;  Laterality: Right;   TUBAL LIGATION      Current Medications: Current Facility-Administered Medications  Medication Dose Route Frequency Provider Last Rate Last Admin   acetaminophen  (TYLENOL ) tablet 650 mg  650 mg Oral Q6H PRN Mannie Jerel PARAS, NP   650 mg at 04/25/24 2007   alum & mag hydroxide-simeth (MAALOX/MYLANTA) 200-200-20 MG/5ML suspension 30 mL  30 mL Oral Q4H PRN Mannie Jerel PARAS, NP       anastrozole  (ARIMIDEX ) tablet 1 mg  1 mg Oral Daily Mannie Jerel PARAS, NP   1 mg at 04/26/24 9050   escitalopram  (LEXAPRO ) tablet 20 mg  20 mg Oral Daily Mannie Jerel PARAS, NP   20 mg at 04/26/24 9050   folic acid  (FOLVITE ) tablet 1 mg  1 mg Oral Daily Mannie Jerel PARAS, NP   1 mg at 04/26/24 9049   gabapentin  (NEURONTIN ) capsule 300 mg  300 mg Oral TID Madaram, Kondal R, MD   300 mg at 04/26/24 0950   magnesium  hydroxide (MILK OF MAGNESIA) suspension 30 mL  30 mL Oral Daily PRN Mannie Jerel PARAS, NP       multivitamin with minerals tablet 1 tablet  1 tablet Oral Daily Mannie Jerel PARAS, NP   1 tablet at 04/26/24 9049   OLANZapine  (ZYPREXA ) injection 5 mg  5 mg Intramuscular TID PRN Mannie Jerel PARAS, NP       OLANZapine  zydis (ZYPREXA ) disintegrating tablet 5 mg  5 mg Oral TID PRN Mannie Jerel PARAS, NP       thiamine  (VITAMIN B1) tablet 100 mg  100 mg Oral Daily Mannie Jerel PARAS, NP   100 mg at 04/26/24 9049   Or   thiamine  (VITAMIN B1) injection 100 mg  100  mg Intravenous Daily Mannie Jerel PARAS, NP       traZODone  (DESYREL ) tablet 50 mg  50 mg Oral QHS PRN Byungura, Veronique M, NP   50 mg at 04/25/24 2131    Lab Results: No results found for this or any previous visit (from the past 48 hours).  Blood Alcohol level:  Lab Results  Component Value Date   ETH 202 (H) 04/20/2024   ETH 44 (H) 04/17/2024    Metabolic Disorder Labs: Lab Results  Component Value Date   HGBA1C 5.6 11/07/2022   MPG 114.02 11/07/2022   MPG 99.67 08/21/2021   Lab Results  Component Value Date   PROLACTIN 8.8  07/31/2022   Lab Results  Component Value Date   CHOL 241 (H) 09/28/2021   TRIG 158 (H) 09/28/2021   HDL 61 09/28/2021   CHOLHDL 4.0 09/28/2021   VLDL 32 09/28/2021   LDLCALC 148 (H) 09/28/2021   LDLCALC 55 08/20/2021    Physical Findings: AIMS:  , ,  ,  ,    CIWA:  CIWA-Ar Total: 0 COWS:      Psychiatric Specialty Exam:  Presentation  General Appearance:  Appropriate for Environment  Eye Contact: Fleeting  Speech: Normal Rate  Speech Volume: Normal    Mood and Affect  Mood: Fine  Affect: Stable   Thought Process  Thought Processes: Coherent  Descriptions of Associations:Intact  Orientation:Full (Time, Place and Person)  Thought Content:Logical  Hallucinations: Denies Ideas of Reference:None  Suicidal Thoughts: Denies Homicidal Thoughts: Denies plan  Sensorium  Memory: Immediate Fair; Recent Fair; Remote Fair  Judgment: Impaired  Insight: Shallow   Executive Functions  Concentration: Fair  Attention Span: Fair  Recall: [employer] of Knowledge: Fair  Language: Fair   Psychomotor Activity  Psychomotor Activity:No data recorded Musculoskeletal: Strength & Muscle Tone: within normal limits Gait & Station: normal Assets  Assets: Manufacturing systems engineer; Desire for Improvement; Physical Health    Physical Exam: Physical Exam ROS Blood pressure 133/78, pulse (!) 103, temperature  97.7 F (36.5 C), resp. rate 17, height 5' 4 (1.626 m), weight 86 kg, SpO2 100%. Body mass index is 32.53 kg/m.  Diagnosis: Principal Problem:   MDD (major depressive disorder), recurrent severe, without psychosis (HCC)   Clinical Decision Making: Patient is currently admitted for worsening symptoms of depression and suicidal ideation in the context of worsening alcohol use, multiple medical problems including stage IV cancer.  Patient had stopped all her medications and treatments with intention to die.  Patient needs close monitoring in the inpatient psychiatric unit.  Patient is responding well to the medications.  Treatment Plan Summary:  Safety and Monitoring:             -- Involuntary admission to inpatient psychiatric unit for safety, stabilization and treatment             -- Daily contact with patient to assess and evaluate symptoms and progress in treatment             -- Patient's case to be discussed in multi-disciplinary team meeting             -- Observation Level: q15 minute checks             -- Vital signs:  q12 hours             -- Precautions: suicide, elopement, and assault   2. Psychiatric Diagnoses and Treatment:              Lexapro  20 mg daily     -- The risks/benefits/side-effects/alternatives to this medication were discussed in detail with the patient and time was given for questions. The patient consents to medication trial.                -- Metabolic profile and EKG monitoring obtained while on an atypical antipsychotic (BMI: Lipid Panel: HbgA1c: QTc:)              -- Encouraged patient to participate in unit milieu and in scheduled group therapies  3. Medical Issues Being Addressed:   4. Discharge Planning:   -- Social work and case management to assist with discharge planning and identification of hospital follow-up needs prior to discharge  -- Estimated LOS: 3-4 days  Allyn Foil, MD 04/26/2024, 12:33 PM

## 2024-04-26 NOTE — Group Note (Signed)
 [facility] LCSW Group Therapy Note   Group Date: 04/26/2024 Start Time: 1315 End Time: 1400   Type of Therapy/Topic:  Group Therapy:  Emotion Regulation  Participation Level:  Active   Mood:  Description of Group:    The purpose of this group is to assist patients in learning to regulate negative emotions and experience positive emotions. Patients will be guided to discuss ways in which they have been vulnerable to their negative emotions. These vulnerabilities will be juxtaposed with experiences of positive emotions or situations, and patients challenged to use positive emotions to combat negative ones. Special emphasis will be placed on coping with negative emotions in conflict situations, and patients will process healthy conflict resolution skills.  Therapeutic Goals: Patient will identify two positive emotions or experiences to reflect on in order to balance out negative emotions:  Patient will label two or more emotions that they find the most difficult to experience:  Patient will be able to demonstrate positive conflict resolution skills through discussion or role plays:   Summary of Patient Progress:   Pt was appropriate during group and showed good insight into subject matter. Pt interacted appropriately with peers and facilitator    Therapeutic Modalities:   Cognitive Behavioral Therapy Feelings Identification Dialectical Behavioral Therapy   Tracie Atkins, LCSWA

## 2024-04-26 NOTE — Group Note (Signed)
 Date:  04/26/2024 Time:  10:37 AM  Group Topic/Focus:  Early Warning Signs:   The focus of this group is to help patients identify signs or symptoms they exhibit before slipping into an unhealthy state or crisis.    Participation Level:  Active  Participation Quality:  Appropriate  Affect:  Appropriate  Cognitive:  Appropriate  Insight: Appropriate  Engagement in Group:  Engaged  Modes of Intervention:  Activity  Additional Comments:    Camellia [name] Calyb Mcquarrie 04/26/2024, 10:37 AM

## 2024-04-26 NOTE — Group Note (Signed)
 Date:  04/26/2024 Time:  3:46 PM  Group Topic/Focus:  Pet Therapy:  [name] the Pet Therapist arrived on our unit and decided to visit our patients today!  [name] benefits all the patients with stress reduction and overall better mental health status.    Participation Level:  Active  Participation Quality:  Appropriate  Affect:  Appropriate  Cognitive:  Appropriate  Insight: Appropriate  Engagement in Group:  Engaged  Modes of Intervention:  Activity  Additional Comments:    Tracie Atkins [name] [name] 04/26/2024, 3:46 PM

## 2024-04-26 NOTE — Progress Notes (Signed)
   04/26/24 1100  Psych Admission Type (Psych Patients Only)  Admission Status Involuntary  Psychosocial Assessment  Patient Complaints Anxiety;Sadness  Eye Contact Brief  Facial Expression Anxious  Affect Anxious  Speech Logical/coherent  Interaction Assertive  Motor Activity Slow  Appearance/Hygiene In scrubs  Behavior Characteristics Appropriate to situation  Mood Anxious  Thought Process  Coherency WDL  Content WDL  Delusions None reported or observed  Perception WDL  Hallucination None reported or observed  Judgment Impaired  Confusion None  Danger to Self  Current suicidal ideation? Denies  Self-Injurious Behavior No self-injurious ideation or behavior indicators observed or expressed   Agreement Not to Harm Self Yes  Description of Agreement Verbal  Danger to Others  Danger to Others None reported or observed

## 2024-04-26 NOTE — Group Note (Signed)
 Recreation Therapy Group Note   Group Topic:Animal Assisted Therapy   Group Date: 04/26/2024 Start Time: 1400 End Time: 1450 Facilitators: Celestia Jeoffrey BRAVO, LRT, CTRS Location: Courtyard  Group Description: AAA. Animal-Assisted Activity provides opportunities for motivational, educational, therapeutic and/or recreational benefits to enhance quality of life. [name] and [name] visited the unit to interact with patients.   Goal Areas Addressed:  Reduced anxiety and stress Improved mood Increased social interaction Enhanced communication skills Reduced loneliness and isolation Improved emotional regulation  Affect/Mood: Appropriate   Participation Level: Active and Engaged   Participation Quality: Independent   Behavior: Calm and Cooperative   Speech/Thought Process: Coherent   Insight: Good   Judgement: Good   Modes of Intervention: Activity   Patient Response to Interventions:  Attentive, Engaged, Interested , and Receptive   Education Outcome:  Acknowledges education   Clinical Observations/Individualized Feedback: [name] was active in their participation of session activities and group discussion. Pt interacted well with LRT and peers duration of session.    Plan: Continue to engage patient in RT group sessions 2-3x/week.   Jeoffrey BRAVO Celestia, LRT, CTRS 04/26/2024 4:07 PM

## 2024-04-26 NOTE — [facility] Counselor (Signed)
 CSW and provider contacted pt's son Dulcinea Kinser to discuss discharge planning.   CSW unable to reach, left HIPAA compliant VM requesting return call.   Lum Croft, MSW, [place] 04/26/2024 3:25 PM

## 2024-04-26 NOTE — Plan of Care (Signed)

## 2024-04-26 NOTE — Group Note (Signed)
 Date:  04/26/2024 Time:  11:24 PM  Group Topic/Focus:  Wrap-Up Group:   The focus of this group is to help patients review their daily goal of treatment and discuss progress on daily workbooks.    Participation Level:  Active  Participation Quality:  Appropriate  Affect:  Appropriate  Cognitive:  Appropriate  Insight: Appropriate  Engagement in Group:  Engaged  Modes of Intervention:  Discussion  Additional Comments:    Tracie Atkins Bunker 04/26/2024, 11:24 PM

## 2024-04-26 NOTE — Plan of Care (Signed)
   Problem: Education: Goal: Emotional status will improve Outcome: Progressing Goal: Mental status will improve Outcome: Progressing

## 2024-04-26 NOTE — Group Note (Signed)
 Date:  04/26/2024 Time:  11:06 PM  Group Topic/Focus:  Wrap-Up Group:   The focus of this group is to help patients review their daily goal of treatment and discuss progress on daily workbooks.    Participation Level:  Minimal  Participation Quality:  Attentive  Affect:  Appropriate  Cognitive:  Oriented  Insight: Limited  Engagement in Group:  Limited  Modes of Intervention:  Discussion  Additional Comments:    Tracie Atkins 04/26/2024, 11:06 PM

## 2024-04-26 NOTE — Progress Notes (Signed)
   04/25/24 2100  Psych Admission Type (Psych Patients Only)  Admission Status Involuntary  Psychosocial Assessment  Patient Complaints Sadness  Eye Contact Fair  Facial Expression Anxious  Affect Anxious  Speech Logical/coherent  Interaction Assertive  Motor Activity Slow  Appearance/Hygiene In scrubs  Behavior Characteristics Appropriate to situation  Mood Anxious  Thought Process  Coherency WDL  Content WDL  Delusions None reported or observed  Perception WDL  Hallucination None reported or observed  Judgment Impaired  Confusion None  Danger to Self  Current suicidal ideation? Denies     04/25/24 2100  Psych Admission Type (Psych Patients Only)  Admission Status Involuntary  Psychosocial Assessment  Patient Complaints Sadness  Eye Contact Fair  Facial Expression Anxious  Affect Anxious  Speech Logical/coherent  Interaction Assertive  Motor Activity Slow  Appearance/Hygiene In scrubs  Behavior Characteristics Appropriate to situation  Mood Anxious  Thought Process  Coherency WDL  Content WDL  Delusions None reported or observed  Perception WDL  Hallucination None reported or observed  Judgment Impaired  Confusion None  Danger to Self  Current suicidal ideation? Denies

## 2024-04-27 MED ORDER — VITAMIN B-1 100 MG PO TABS: 100.0000 mg | ORAL_TABLET | Freq: Every day | ORAL | 0 refills | Status: AC

## 2024-04-27 MED ORDER — GABAPENTIN 300 MG PO CAPS: 300.0000 mg | ORAL_CAPSULE | Freq: Three times a day (TID) | ORAL | 0 refills | Status: AC

## 2024-04-27 MED ORDER — ADULT MULTIVITAMIN W/MINERALS CH: 1.0000 | ORAL_TABLET | Freq: Every day | ORAL | 0 refills | Status: AC

## 2024-04-27 MED ORDER — ESCITALOPRAM OXALATE 20 MG PO TABS: 20.0000 mg | ORAL_TABLET | Freq: Every day | ORAL | 1 refills | Status: AC

## 2024-04-27 MED ORDER — FOLIC ACID 1 MG PO TABS: 1.0000 mg | ORAL_TABLET | Freq: Every day | ORAL | 0 refills | Status: AC

## 2024-04-27 NOTE — [facility] Counselor (Signed)
 CSW contacted pt's son Elsie per pt's request to confirm discharge [phone]).   Elsie is agreeable to discharge.   CSW will inform care team.   Lum Croft, MSW, [facility] 04/27/2024 10:11 AM

## 2024-04-27 NOTE — Group Note (Signed)
 Date:  04/27/2024 Time:  11:03 AM  Group Topic/Focus:  Making Healthy Choices:   The focus of this group is to help patients identify negative/unhealthy choices they were using prior to admission and identify positive/healthier coping strategies to replace them upon discharge.    Participation Level:  Active  Participation Quality:  Appropriate  Affect:  Appropriate  Cognitive:  Appropriate  Insight: Appropriate  Engagement in Group:  Engaged  Modes of Intervention:  Discussion  Arland Nutting 04/27/2024, 11:03 AM

## 2024-04-27 NOTE — Care Management Important Message (Signed)
 Important Message  Patient Details  Name: Tracie Atkins MRN: [id number] Date of Birth: Sep 03, 1955   Important Message Given:  Yes - Medicare IM     Lum [name], LCSWA 04/27/2024, 10:33 AM

## 2024-04-27 NOTE — Progress Notes (Signed)
   04/27/24 1100  Psych Admission Type (Psych Patients Only)  Admission Status Involuntary  Psychosocial Assessment  Patient Complaints Worrying  Eye Contact Brief  Facial Expression Worried  Affect Flat  Speech Logical/coherent  Interaction Assertive  Motor Activity Slow  Appearance/Hygiene In scrubs  Behavior Characteristics Appropriate to situation  Mood Sad  Thought Process  Coherency WDL  Content WDL  Delusions None reported or observed  Perception WDL  Hallucination None reported or observed  Judgment Impaired  Confusion None  Danger to Self  Current suicidal ideation? Denies  Agreement Not to Harm Self Yes  Description of Agreement verbal  Danger to Others  Danger to Others None reported or observed   Patient discharged in care of family. All discharge instructions given to patient with acknowledgement.

## 2024-04-27 NOTE — Discharge Summary (Signed)
 Physician Discharge Summary Note  Patient:  Tracie Atkins is an 68 y.o., female MRN:  [id number] DOB:  July 15, 1955 Patient phone:  [phone] (home)  Patient address:   [address] [place] [address],   Total time spent: 40 min Date of Admission:  04/21/2024 Date of Discharge: 04/27/24  Reason for Admission:   Tracie Atkins is a 68 y.o. female admitted: Presented to the EDfor 04/20/2024 11:12 AM for complaints of suicidal, IVC. She carries the psychiatric diagnoses of MDD anxiety and alcohol abuse and has a past medical history of metaplastic cancer. Patient is admitted to [facility] unit with Q15 min safety monitoring. Multidisciplinary team approach is offered. Medication management; group/milieu therapy is offered.   Principal Problem: MDD (major depressive disorder), recurrent severe, without psychosis (HCC) Discharge Diagnoses: Principal Problem:   MDD (major depressive disorder), recurrent severe, without psychosis (HCC)   Past Psychiatric History: see h&p  Family Psychiatric  History: see h&p Social History:  Social History   Substance and Sexual Activity  Alcohol Use Yes   Alcohol/week: 1.0 standard drink of alcohol   Types: 1 Standard drinks or equivalent per week   Comment: 1 mixed (liquor) drink per week     Social History   Substance and Sexual Activity  Drug Use No    Social History   Socioeconomic History   Marital status: Single    Spouse name: Not on file   Number of children: 3   Years of education: Not on file   Highest education level: Not on file  Occupational History   Not on file  Tobacco Use   Smoking status: Never    Passive exposure: Never   Smokeless tobacco: Never  Vaping Use   Vaping status: Never Used  Substance and Sexual Activity   Alcohol use: Yes    Alcohol/week: 1.0 standard drink of alcohol    Types: 1 Standard drinks or equivalent per week    Comment: 1 mixed (liquor) drink per week   Drug use: No   Sexual  activity: Never  Other Topics Concern   Not on file  Social History Narrative   Not on file   Social Drivers of Health   Financial Resource Strain: Not on file  Food Insecurity: No Food Insecurity (04/21/2024)   Hunger Vital Sign    Worried About Running Out of Food in the Last Year: Never true    Ran Out of Food in the Last Year: Never true  Transportation Needs: No Transportation Needs (04/21/2024)   PRAPARE - Administrator, Civil Service (Medical): No    Lack of Transportation (Non-Medical): No  Physical Activity: Not on file  Stress: Not on file  Social Connections: Patient Declined (04/21/2024)   Social Connection and Isolation Panel    Frequency of Communication with Friends and Family: Patient declined    Frequency of Social Gatherings with Friends and Family: Patient declined    Attends Religious Services: Patient declined    Database administrator or Organizations: Patient declined    Attends Banker Meetings: Patient declined    Marital Status: Patient declined   Past Medical History:  Past Medical History:  Diagnosis Date   Breast cancer (HCC) 2023   left   Chronic dental infection 8/189   multiple teeth removed and Post -op infection.   Depression    ETOH abuse    Fatty liver    Headache     Past Surgical History:  Procedure Laterality Date   BREAST LUMPECTOMY WITH RADIOACTIVE SEED AND SENTINEL LYMPH NODE BIOPSY Left 01/15/2022   Procedure: LEFT BREAST SEED LOCALIZED LUMPECTOMY, LEFT SEED TARGETED AXILLARY LYMPH NODE BIOPSY, LEFT SENTINEL LYMPH NODE BIOPSY;  Surgeon: Aron Shoulders, MD;  Location: [facility] OR;  Service: General;  Laterality: Left;   DENTAL SURGERY     RADIOACTIVE SEED GUIDED EXCISIONAL BREAST BIOPSY Right 01/15/2022   Procedure: RIGHT BREAST SEED LOCALIZED EXCISIONAL BIOPSY;  Surgeon: Aron Shoulders, MD;  Location: [facility] OR;  Service: General;  Laterality: Right;   TUBAL LIGATION     Family History: History reviewed. No  pertinent family history.  Hospital Course:   Tracie Atkins is a 68 y.o. female admitted: Presented to the EDfor 04/20/2024 11:12 AM for complaints of suicidal, IVC. She carries the psychiatric diagnoses of MDD anxiety and alcohol abuse and has a past medical history of metaplastic cancer. Patient is admitted to [facility] unit with Q15 min safety monitoring. Multidisciplinary team approach is offered. Medication management; group/milieu therapy is offered.  Detailed risk assessment is complete based on clinical exam and individual risk factors and acute suicide risk is low and acute violence risk is low.    On admission patient is continued on Lexapro  20 mg daily which is her home medication.  Patient was put on CIWA protocol and completed the detox with no complications.  Patient's mood improved and she consistently denied feeling depressed or anxious.  Patient participated in groups and maintain her safety.  On the day of discharge patient denies SI/HI/plan and denies auditory/visual hallucinations.  She remains future oriented and is willing to participate in outpatient mental health services Currently, all modifiable risk of harm to self/harm to others have been addressed and patient is no longer appropriate for the acute inpatient setting and is able to continue treatment for mental health needs in the community with the supports as indicated below.  Patient is educated and verbalized understanding of discharge plan of care including medications, follow-up appointments, mental health resources and further crisis services in the community.  He is instructed to call 911 or present to the nearest emergency room should he experience any decompensation in mood, disturbance of bowel or return of suicidal/homicidal ideations.  Patient verbalizes understanding of this education and agrees to this plan of care  Physical Findings: AIMS:  , ,  ,  ,    CIWA:  CIWA-Ar Total: 0 COWS:        Psychiatric  Specialty Exam:  Presentation  General Appearance:  Appropriate for Environment  Eye Contact: Fair  Speech: Clear and Coherent  Speech Volume: Normal    Mood and Affect  Mood: Euthymic  Affect: Appropriate   Thought Process  Thought Processes: Coherent  Descriptions of Associations:Intact  Orientation:Full (Time, Place and Person)  Thought Content:Logical  Hallucinations:Hallucinations: None  Ideas of Reference:None  Suicidal Thoughts:Suicidal Thoughts: No  Homicidal Thoughts:Homicidal Thoughts: No   Sensorium  Memory: Immediate Fair; Remote Fair  Judgment: Fair  Insight: Fair   Art therapist  Concentration: Fair  Attention Span: Fair  Recall: Fair  Fund of Knowledge: Fair  Language: Fair   Psychomotor Activity  Psychomotor Activity: Psychomotor Activity: Normal  Musculoskeletal: Strength & Muscle Tone: within normal limits Gait & Station: normal Assets  Assets: Manufacturing systems engineer; Desire for Improvement; Resilience; Social Support   Sleep  Sleep: Sleep: Fair    Physical Exam: Physical Exam Vitals and nursing note reviewed.    ROS Blood pressure 117/61, pulse 94, temperature  97.6 F (36.4 C), resp. rate 18, height 5' 4 (1.626 m), weight 86 kg, SpO2 99%. Body mass index is 32.53 kg/m.   Social History   Tobacco Use  Smoking Status Never   Passive exposure: Never  Smokeless Tobacco Never   Tobacco Cessation:  N/A, patient does not currently use tobacco products   Blood Alcohol level:  Lab Results  Component Value Date   ETH 202 (H) 04/20/2024   ETH 44 (H) 04/17/2024    Metabolic Disorder Labs:  Lab Results  Component Value Date   HGBA1C 5.6 11/07/2022   MPG 114.02 11/07/2022   MPG 99.67 08/21/2021   Lab Results  Component Value Date   PROLACTIN 8.8 07/31/2022   Lab Results  Component Value Date   CHOL 241 (H) 09/28/2021   TRIG 158 (H) 09/28/2021   HDL 61 09/28/2021   CHOLHDL  4.0 09/28/2021   VLDL 32 09/28/2021   LDLCALC 148 (H) 09/28/2021   LDLCALC 55 08/20/2021    See Psychiatric Specialty Exam and Suicide Risk Assessment completed by Attending Physician prior to discharge.  Discharge destination:  Home  Is patient on multiple antipsychotic therapies at discharge:  No   Has Patient had three or more failed trials of antipsychotic monotherapy by history:  No  Recommended Plan for Multiple Antipsychotic Therapies: NA  Discharge Instructions     Diet - low sodium heart healthy   Complete by: As directed    Increase activity slowly   Complete by: As directed       Allergies as of 04/27/2024   No Known Allergies      Medication List     TAKE these medications      Indication  anastrozole  1 MG tablet Commonly known as: ARIMIDEX  Take 1 tablet (1 mg total) by mouth daily.  Indication: Cancer of the Breast   escitalopram  20 MG tablet Commonly known as: LEXAPRO  Take 1 tablet (20 mg total) by mouth daily.  Indication: Generalized Anxiety Disorder   folic acid  1 MG tablet Commonly known as: FOLVITE  Take 1 tablet (1 mg total) by mouth daily. 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  Indication: Generalized Anxiety Disorder   lidocaine  5 % Commonly known as: Lidoderm  Place 1 patch onto the skin daily. Remove & Discard patch within 12 hours or as directed by MD    meloxicam  7.5 MG tablet Commonly known as: Mobic  Take 1 tablet (7.5 mg total) by mouth daily.    methocarbamol  500 MG tablet Commonly known as: ROBAXIN  Take 1 tablet (500 mg total) by mouth 2 (two) times daily.    montelukast 10 MG tablet Commonly known as: SINGULAIR Take 10 mg by mouth at bedtime.    multivitamin with minerals Tabs tablet Take 1 tablet by mouth daily. Start taking on: April 28, 2024   Indication: 21-Hydroxylase Deficiency   ondansetron  8 MG tablet Commonly known as: ZOFRAN  Take 1 tablet (8 mg total) by mouth every 8 (eight) hours as needed for nausea or vomiting.  Indication: Nausea and Vomiting caused by Cancer Chemotherapy   pantoprazole  40 MG tablet Commonly known as: PROTONIX  Take 40 mg by mouth at bedtime.    thiamine  100 MG tablet Commonly known as: Vitamin B-1 Take 1 tablet (100 mg total) by mouth daily. Start taking on: April 28, 2024  Indication:  Deficiency of Vitamin B1        Follow-up Information     [facility] Go on 05/04/2024.   Why: Your appointment is scheduled for 05/04/24 at 3:50 PM. Contact information: [address] [place] [address] [phone]                 Follow-up recommendations:  Activity:  As tolerated    Signed: Rodrickus Min, MD 04/27/2024, 12:14 PM

## 2024-04-27 NOTE — [facility] Suicide Risk Assessment (Signed)
 [facility] Discharge Suicide Risk Assessment   Principal Problem: MDD (major depressive disorder), recurrent severe, without psychosis (HCC) Discharge Diagnoses: Principal Problem:   MDD (major depressive disorder), recurrent severe, without psychosis (HCC)   Total Time spent with patient: 30 minutes  Musculoskeletal: Strength & Muscle Tone: within normal limits Gait & Station: normal Patient leans: N/A  Psychiatric Specialty Exam  Presentation  General Appearance:  Appropriate for Environment  Eye Contact: Fair  Speech: Clear and Coherent  Speech Volume: Normal  Handedness: Right   Mood and Affect  Mood: Euthymic  Duration of Depression Symptoms: No data recorded Affect: Appropriate   Thought Process  Thought Processes: Coherent  Descriptions of Associations:Intact  Orientation:Full (Time, Place and Person)  Thought Content:Logical  History of Schizophrenia/Schizoaffective disorder:No  Duration of Psychotic Symptoms:No data recorded Hallucinations:Hallucinations: None  Ideas of Reference:None  Suicidal Thoughts:Suicidal Thoughts: No  Homicidal Thoughts:Homicidal Thoughts: No   Sensorium  Memory: Immediate Fair; Remote Fair  Judgment: Fair  Insight: Fair   Art therapist  Concentration: Fair  Attention Span: Fair  Recall: [employer] of Knowledge: Fair  Language: Fair   Psychomotor Activity  Psychomotor Activity: Psychomotor Activity: Normal   Assets  Assets: Communication Skills; Desire for Improvement; Resilience; Social Support   Sleep  Sleep: Sleep: Fair  Estimated Sleeping Duration (Last 24 Hours): 7.75-9.00 hours  Physical Exam: Physical Exam ROS Blood pressure 117/61, pulse 94, temperature 97.6 F (36.4 C), resp. rate 18, height 5' 4 (1.626 m), weight 86 kg, SpO2 99%. Body mass index is 32.53 kg/m.  Mental Status Per Nursing Assessment::   On Admission:  Self-harm thoughts  Demographic  Factors:  Low socioeconomic status  Loss Factors: Decrease in vocational status  Historical Factors: Impulsivity  Risk Reduction Factors:   Living with another person, especially a relative, Positive social support, Positive therapeutic relationship, and Positive coping skills or problem solving skills  Continued Clinical Symptoms:  Depression:   Comorbid alcohol abuse/dependence  Cognitive Features That Contribute To Risk:  None    Suicide Risk:  Minimal: No identifiable suicidal ideation.  Patients presenting with no risk factors but with morbid ruminations; may be classified as minimal risk based on the severity of the depressive symptoms   Follow-up Information     [facility] Go on 05/04/2024.   Why: Your appointment is scheduled for 05/04/24 at 3:50 PM. Contact information: [address] [place] [address] [phone]                 Plan Of Care/Follow-up recommendations:  Activity:  As tolerated  Allyn Foil, MD 04/27/2024, 12:13 PM

## 2024-04-27 NOTE — BHH Counselor (Signed)
 CSW attempted to contact pt's son Julio, [phone], regarding pt's discharge.   CSW unable to reach, left HIPAA compliant VM requesting return call.  Lum Croft, MSW, [place] 04/27/2024 9:26 AM

## 2024-04-27 NOTE — Progress Notes (Signed)
   04/26/24 2100  Psych Admission Type (Psych Patients Only)  Admission Status Involuntary  Psychosocial Assessment  Patient Complaints Anxiety;Sadness  Eye Contact Brief  Facial Expression Anxious  Affect Flat  Speech Logical/coherent  Interaction Assertive  Motor Activity Slow  Appearance/Hygiene In scrubs  Behavior Characteristics Appropriate to situation  Mood Sad  Thought Process  Coherency WDL  Content WDL  Delusions None reported or observed  Perception WDL  Hallucination None reported or observed  Judgment Impaired  Confusion None  Danger to Self  Current suicidal ideation? Denies

## 2024-04-27 NOTE — Plan of Care (Signed)

## 2024-04-27 NOTE — Progress Notes (Signed)
  [facility] Adult Case Management Discharge Plan :  Will you be returning to the same living situation after discharge:  Yes,  pt will return home  At discharge, do you have transportation home?: Yes,  pt's son will pick her up Do you have the ability to pay for your medications: [employer] MEDICARE / DREMA DUAL COMPLETE   Release of information consent forms completed and in the chart;  Patient's signature needed at discharge.  Patient to Follow up at:  Follow-up Information     [facility] Go on 05/04/2024.   Why: Your appointment is scheduled for 05/04/24 at 3:50 PM. Contact information: [address] [place] [address] [phone]                 Next level of care provider has access to [facility] Link:no  Safety Planning and Suicide Prevention discussed: [place]  Tracie Atkins, son, [phone]     Has patient been referred to the Quitline?: Patient does not use tobacco/nicotine products  Patient has been referred for addiction treatment: No known substance use disorder.  [address] LCSWA 04/27/2024, 10:29 AM

## 2024-04-28 ENCOUNTER — Telehealth: Payer: Self-pay | Admitting: Hematology and Oncology

## 2024-04-28 ENCOUNTER — Telehealth: Payer: Self-pay

## 2024-04-28 NOTE — Telephone Encounter (Signed)
 left vm for patient to call back and reschedule her missed appt

## 2024-04-28 NOTE — Telephone Encounter (Signed)
 Pt called and LVM stating she was in the hospital and unable to cancel her appt or r/s because she was not able to have her phone.  She is requesting a call back from our scheduler. Message sent to scheduler to call pt.

## 2024-05-16 ENCOUNTER — Telehealth: Payer: Self-pay

## 2024-05-16 NOTE — Telephone Encounter (Signed)
Left message for patient about upcoming appointment.

## 2024-05-17 ENCOUNTER — Inpatient Hospital Stay: Admitting: Hematology and Oncology

## 2024-05-17 ENCOUNTER — Telehealth: Payer: Self-pay | Admitting: Hematology and Oncology

## 2024-05-17 ENCOUNTER — Inpatient Hospital Stay: Attending: Internal Medicine

## 2024-05-17 NOTE — Telephone Encounter (Signed)
 I left voicemail for patient to call back if rescheduled appointment from 05/17/2024 to 06/08/2024 does not work for her.

## 2024-05-17 NOTE — Progress Notes (Deleted)
 [facility] Cancer Follow up:    System, Provider Not In No address on file   DIAGNOSIS:  Cancer Staging  Malignant neoplasm of upper-outer quadrant of left breast in female, estrogen receptor positive (HCC) Staging form: Breast, AJCC 8th Edition - Clinical: Stage IIA (cT2, cN1, cM0, G2, ER+, PR+, HER2-) - Signed by Odean Potts, MD on 12/05/2021 Stage prefix: Initial diagnosis Histologic grading system: 3 grade system - Clinical: Stage IV (cM1) - Signed by Crawford Morna Pickle, NP on 01/16/2023   SUMMARY OF ONCOLOGIC HISTORY: Oncology History  Malignant neoplasm of upper-outer quadrant of left breast in female, estrogen receptor positive (HCC)  11/05/2021 Initial Diagnosis   Palpable lump in the left breast with the pain along with right-sided nipple discharge.  Mammogram reveals highly suspicious mass left breast 3 o'clock position 2.2 cm, 2 abnormal left axillary lymph nodes: Biopsy: Grade 2 IDC ER 100%, PR 0%, Ki-67 10%, HER2 2+ by IHC, FISH negative ratio 1.4, axillary lymph node biopsy: IDC, ER 100%, PR 20%, Ki-67 10%, HER2 negative ratio 1.31   11/28/2021 Breast MRI   Left breast cancer 2.7 cm and 0.7 cm linear enhancement UOQ left breast needs biopsy, 0.5 cm mass posterior to the right nipple needs biopsy, 2-lymph nodes left axilla, 1.5 cm sternal lesion   12/05/2021 Cancer Staging   Staging form: Breast, AJCC 8th Edition - Clinical: Stage IIA (cT2, cN1, cM0, G2, ER+, PR+, HER2-) - Signed by Odean Potts, MD on 12/05/2021 Stage prefix: Initial diagnosis Histologic grading system: 3 grade system   01/15/2022 Pathology Results   SURGICAL PATHOLOGY  CASE: [id number]  PATIENT: Tracie Atkins  Surgical Pathology Report      Clinical History: left breast cancer (cm)   FINAL MICROSCOPIC DIAGNOSIS:   A. BREAST, LEFT, LUMPECTOMY:  Invasive ductal carcinoma with clear margins of resection.  Please see the synoptic report after specimen D.   B. BREAST, RIGHT,  LUMPECTOMY:  Fibrous scar with hemosiderin deposits.  Carcinoma is not identified in the resected specimen.   C. LYMPH NODE, LEFT AXILLARY #2, SENTINEL, EXCISION:  A lymph node negative for metastatic carcinoma.   D. 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   pTNM classification ( AJCC 8th Edition): pT2, pN1a  Results of prognostic markers performed on prior biopsy from 11/05/2021  ([id number])           ER: Positive in 100% of tumor cells.           PR: Negative.           Ki-67: Positive in 10% of tumor cells.           HER-2/neu: 2+ by IHC/ Negative by FISH.    02/17/2022 Oncotype testing   Oncotype of 11, no benefit from chemotherapy.   03/26/2022 - 04/11/2022 Radiation Therapy   9 fractions totaling 16.2 Gy prior to relocation to [place]    10/20/2022 PET scan   1. Multifocal hypermetabolic skeletal metastasis involving the sternum, T6 vertebral body, T5 and T7 posterior elements, and inferior RIGHT scapula. 2. Hypermetabolic LEFT internal mammary lymph node most consistent with metastatic adenopathy. 3. Postsurgical change in the RIGHT and LEFT breast. No residual carcinoma identified in the breasts or axilla by FDG PET imaging.     10/23/2022 - 11/05/2022 Radiation Therapy   Plan Name: Chest Site: Thorax including T5-7 and sternum Technique: 3D Mode: Photon Dose Per Fraction: 3 Gy Prescribed Dose (Delivered /  Prescribed): 30 Gy / 30 Gy Prescribed Fxs (Delivered / Prescribed): 10 / 10   11/05/2022 -  Anti-estrogen oral therapy   Anastrozole  daily   12/16/2022 Imaging   IMPRESSION: 1. Osseous metastases T3 through T7 levels as above. Mild chronic compression deformity of T3, possibly pathologic. No extra osseous or epidural extension of tumor. 2. Mild multilevel degenerative spondylosis as above. No significant stenosis or overt neural impingement.     01/16/2023 Cancer Staging   Staging form:  Breast, AJCC 8th Edition - Clinical: Stage IV (cM1) - Signed by Crawford Morna Pickle, NP on 01/16/2023     CURRENT THERAPY: Anastrozole   INTERVAL HISTORY:  Tracie Atkins 68 y.o. female returns for f/u of her metastatic breast cancer.     Rest of the pertinent 10 point ROS reviewed and negative  Patient Active Problem List   Diagnosis Date Noted   MDD (major depressive disorder), recurrent severe, without psychosis (HCC) 04/21/2024   Cocaine abuse (HCC) 04/20/2024   Metastasis to bone (HCC) 01/16/2023   Vitamin D deficiency 01/16/2023   Seasonal allergies 01/16/2023   Obesity (BMI 30.0-34.9) 01/16/2023   Hyperlipidemia 01/16/2023   Essential hypertension 01/16/2023   Alcohol use disorder, severe, dependence (HCC) 11/07/2022   Pituitary mass 07/31/2022   Malignant neoplasm of upper-outer quadrant of left breast in female, estrogen receptor positive (HCC) 12/05/2021   Severe episode of recurrent major depressive disorder, without psychotic features (HCC) 08/19/2021   Suicidal ideation    Alcohol abuse with alcohol-induced mood disorder (HCC) 11/20/2016    has no known allergies.  MEDICAL HISTORY: Past Medical History:  Diagnosis Date   Breast cancer (HCC) 2023   left   Chronic dental infection 8/189   multiple teeth removed and Post -op infection.   Depression    ETOH abuse    Fatty liver    Headache     SURGICAL HISTORY: Past Surgical History:  Procedure Laterality Date   BREAST LUMPECTOMY WITH RADIOACTIVE SEED AND SENTINEL LYMPH NODE BIOPSY Left 01/15/2022   Procedure: LEFT BREAST SEED LOCALIZED LUMPECTOMY, LEFT SEED TARGETED AXILLARY LYMPH NODE BIOPSY, LEFT SENTINEL LYMPH NODE BIOPSY;  Surgeon: Aron Shoulders, MD;  Location: [facility] OR;  Service: General;  Laterality: Left;   DENTAL SURGERY     RADIOACTIVE SEED GUIDED EXCISIONAL BREAST BIOPSY Right 01/15/2022   Procedure: RIGHT BREAST SEED LOCALIZED EXCISIONAL BIOPSY;  Surgeon: Aron Shoulders, MD;  Location: [facility] OR;   Service: General;  Laterality: Right;   TUBAL LIGATION      SOCIAL HISTORY: Social History   Socioeconomic History   Marital status: Single    Spouse name: Not on file   Number of children: 3   Years of education: Not on file   Highest education level: Not on file  Occupational History   Not on file  Tobacco Use   Smoking status: Never    Passive exposure: Never   Smokeless tobacco: Never  Vaping Use   Vaping status: Never Used  Substance and Sexual Activity   Alcohol use: Yes    Alcohol/week: 1.0 standard drink of alcohol    Types: 1 Standard drinks or equivalent per week    Comment: 1 mixed (liquor) drink per week   Drug use: No   Sexual activity: Never  Other Topics Concern   Not on file  Social History Narrative   Not on file   Social Drivers of Health   Financial Resource Strain: Not on file  Food Insecurity: No Food Insecurity (04/21/2024)  Hunger Vital Sign    Worried About Running Out of Food in the Last Year: Never true    Ran Out of Food in the Last Year: Never true  Transportation Needs: No Transportation Needs (04/21/2024)   PRAPARE - Administrator, Civil Service (Medical): No    Lack of Transportation (Non-Medical): No  Physical Activity: Not on file  Stress: Not on file  Social Connections: Patient Declined (04/21/2024)   Social Connection and Isolation Panel    Frequency of Communication with Friends and Family: Patient declined    Frequency of Social Gatherings with Friends and Family: Patient declined    Attends Religious Services: Patient declined    Database Administrator or Organizations: Patient declined    Attends Banker Meetings: Patient declined    Marital Status: Patient declined  Intimate Partner Violence: Patient Declined (04/21/2024)   Humiliation, Afraid, Rape, and Kick questionnaire    Fear of Current or Ex-Partner: Patient declined    Emotionally Abused: Patient declined    Physically Abused: Patient  declined    Sexually Abused: Patient declined    FAMILY HISTORY: No family history on file.  Review of Systems  Constitutional:  Negative for appetite change, chills, fatigue, fever and unexpected weight change.  HENT:   Negative for hearing loss, lump/mass and trouble swallowing.   Eyes:  Negative for eye problems and icterus.  Respiratory:  Negative for chest tightness, cough and shortness of breath.   Cardiovascular:  Negative for chest pain, leg swelling and palpitations.  Gastrointestinal:  Negative for abdominal distention, abdominal pain, constipation, diarrhea, nausea and vomiting.  Endocrine: Negative for hot flashes.  Genitourinary:  Negative for difficulty urinating.   Musculoskeletal:  Negative for arthralgias.  Skin:  Negative for itching and rash.  Neurological:  Negative for dizziness, extremity weakness, headaches and numbness.  Hematological:  Negative for adenopathy. Does not bruise/bleed easily.  Psychiatric/Behavioral:  Negative for depression and suicidal ideas. The patient is not nervous/anxious.       PHYSICAL EXAMINATION   There were no vitals filed for this visit.     Physical Exam Constitutional:      General: She is not in acute distress.    Appearance: Normal appearance. She is not toxic-appearing.  HENT:     Head: Normocephalic and atraumatic.     Mouth/Throat:     Mouth: Mucous membranes are moist.     Pharynx: Oropharynx is clear. No oropharyngeal exudate or posterior oropharyngeal erythema.  Eyes:     General: No scleral icterus. Cardiovascular:     Rate and Rhythm: Normal rate and regular rhythm.     Pulses: Normal pulses.     Heart sounds: Normal heart sounds.  Pulmonary:     Effort: Pulmonary effort is normal.     Breath sounds: Normal breath sounds.  Abdominal:     General: Abdomen is flat. Bowel sounds are normal. There is no distension.     Palpations: Abdomen is soft.     Tenderness: There is no abdominal tenderness.   Musculoskeletal:        General: No swelling.     Cervical back: Neck supple.  Lymphadenopathy:     Cervical: No cervical adenopathy.  Skin:    General: Skin is warm and dry.     Findings: No rash.  Neurological:     General: No focal deficit present.     Mental Status: She is alert.  Psychiatric:  Mood and Affect: Mood normal.        Behavior: Behavior normal.     LABORATORY DATA:  CBC    Component Value Date/Time   WBC 4.9 04/20/2024 1158   RBC 3.97 04/20/2024 1158   HGB 9.8 (L) 04/20/2024 1158   HCT 31.9 (L) 04/20/2024 1158   PLT 227 04/20/2024 1158   MCV 80.4 04/20/2024 1158   MCH 24.7 (L) 04/20/2024 1158   MCHC 30.7 04/20/2024 1158   RDW 18.0 (H) 04/20/2024 1158   LYMPHSABS 1.0 04/17/2024 1635   MONOABS 0.6 04/17/2024 1635   EOSABS 0.1 04/17/2024 1635   BASOSABS 0.0 04/17/2024 1635    CMP     Component Value Date/Time   NA 144 04/20/2024 1158   K 3.6 04/20/2024 1158   CL 109 04/20/2024 1158   CO2 23 04/20/2024 1158   GLUCOSE 93 04/20/2024 1158   BUN 14 04/20/2024 1158   CREATININE 0.76 04/20/2024 1158   CREATININE 0.68 07/27/2023 1103   CALCIUM 8.4 (L) 04/20/2024 1158   PROT 6.0 (L) 04/20/2024 1158   ALBUMIN 3.2 (L) 04/20/2024 1158   AST 20 04/20/2024 1158   AST 15 07/27/2023 1103   ALT 15 04/20/2024 1158   ALT 12 07/27/2023 1103   ALKPHOS 135 (H) 04/20/2024 1158   BILITOT 0.3 04/20/2024 1158   BILITOT 0.3 07/27/2023 1103   GFRNONAA >60 04/20/2024 1158   GFRNONAA >60 07/27/2023 1103   GFRAA >60 06/12/2018 1323    ASSESSMENT and THERAPY PLAN:   No problem-specific Assessment & Plan notes found for this encounter.   All questions were answered. The patient knows to call the clinic with any problems, questions or concerns. We can certainly see the patient much sooner if necessary.  Total encounter time:30 minutes*in face-to-face visit time, chart review, lab review, care coordination, order entry, and documentation of the encounter  time.   *Total Encounter Time as defined by the Centers for Medicare and Medicaid Services includes, in addition to the face-to-face time of a patient visit (documented in the note above) non-face-to-face time: obtaining and reviewing outside history, ordering and reviewing medications, tests or procedures, care coordination (communications with other health care professionals or caregivers) and documentation in the medical record.

## 2024-05-17 NOTE — Assessment & Plan Note (Deleted)
 Metastatic breast cancer Under treatment with anastrozole . No progression on last imaging in 08/2023. Back pain and numbness warrant further investigation for possible progression or new lesions. - Continue anastrozole . - Repeat imaging in August or early September. - Tumor markers of no yield, they were neg last yr.  Depression Continue lexapro . Refilled today, she is having transportation issues to go to psych follow up. No suicidal ideation. Social and housing issues contribute to distress. - Refer to social work for support with social and housing issues. - Continue follow up with psychiatry.   RTC in 8 weeks for labs and f/u with Dr. Loretha

## 2024-06-08 ENCOUNTER — Inpatient Hospital Stay: Attending: Internal Medicine | Admitting: Hematology and Oncology

## 2024-06-08 ENCOUNTER — Inpatient Hospital Stay

## 2024-06-08 VITALS — BP 120/78 | HR 111 | Temp 97.8°F | Resp 18 | Ht 64.0 in | Wt 193.4 lb

## 2024-06-08 DIAGNOSIS — C50412 Malignant neoplasm of upper-outer quadrant of left female breast: Secondary | ICD-10-CM | POA: Diagnosis not present

## 2024-06-08 DIAGNOSIS — C7951 Secondary malignant neoplasm of bone: Secondary | ICD-10-CM | POA: Insufficient documentation

## 2024-06-08 DIAGNOSIS — Z79811 Long term (current) use of aromatase inhibitors: Secondary | ICD-10-CM | POA: Diagnosis not present

## 2024-06-08 DIAGNOSIS — Z170 Estrogen receptor positive status [ER+]: Secondary | ICD-10-CM | POA: Insufficient documentation

## 2024-06-08 MED ORDER — ANASTROZOLE 1 MG PO TABS: 1.0000 mg | ORAL_TABLET | Freq: Every day | ORAL | 3 refills | Status: AC

## 2024-06-08 NOTE — Progress Notes (Signed)
 [facility] Cancer Follow up:    System, Provider Not In No address on file   DIAGNOSIS:  Cancer Staging  Malignant neoplasm of upper-outer quadrant of left breast in female, estrogen receptor positive (HCC) Staging form: Breast, AJCC 8th Edition - Clinical: Stage IIA (cT2, cN1, cM0, G2, ER+, PR+, HER2-) - Signed by Odean Potts, MD on 12/05/2021 Stage prefix: Initial diagnosis Histologic grading system: 3 grade system - Clinical: Stage IV (cM1) - Signed by Crawford Morna Pickle, NP on 01/16/2023   SUMMARY OF ONCOLOGIC HISTORY: Oncology History  Malignant neoplasm of upper-outer quadrant of left breast in female, estrogen receptor positive (HCC)  11/05/2021 Initial Diagnosis   Palpable lump in the left breast with the pain along with right-sided nipple discharge.  Mammogram reveals highly suspicious mass left breast 3 o'clock position 2.2 cm, 2 abnormal left axillary lymph nodes: Biopsy: Grade 2 IDC ER 100%, PR 0%, Ki-67 10%, HER2 2+ by IHC, FISH negative ratio 1.4, axillary lymph node biopsy: IDC, ER 100%, PR 20%, Ki-67 10%, HER2 negative ratio 1.31   11/28/2021 Breast MRI   Left breast cancer 2.7 cm and 0.7 cm linear enhancement UOQ left breast needs biopsy, 0.5 cm mass posterior to the right nipple needs biopsy, 2-lymph nodes left axilla, 1.5 cm sternal lesion   12/05/2021 Cancer Staging   Staging form: Breast, AJCC 8th Edition - Clinical: Stage IIA (cT2, cN1, cM0, G2, ER+, PR+, HER2-) - Signed by Odean Potts, MD on 12/05/2021 Stage prefix: Initial diagnosis Histologic grading system: 3 grade system   01/15/2022 Pathology Results   SURGICAL PATHOLOGY  CASE: [id number]  PATIENT: [name]  Surgical Pathology Report      Clinical History: left breast cancer (cm)   FINAL MICROSCOPIC DIAGNOSIS:   A. BREAST, LEFT, LUMPECTOMY:  Invasive ductal carcinoma with clear margins of resection.  Please see the synoptic report after specimen D.   B. BREAST, RIGHT,  LUMPECTOMY:  Fibrous scar with hemosiderin deposits.  Carcinoma is not identified in the resected specimen.   C. LYMPH NODE, LEFT AXILLARY #2, SENTINEL, EXCISION:  A lymph node negative for metastatic carcinoma.   D. 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   pTNM classification ( AJCC 8th Edition): pT2, pN1a  Results of prognostic markers performed on prior biopsy from 11/05/2021  ([id number])           ER: Positive in 100% of tumor cells.           PR: Negative.           Ki-67: Positive in 10% of tumor cells.           HER-2/neu: 2+ by IHC/ Negative by FISH.    02/17/2022 Oncotype testing   Oncotype of 11, no benefit from chemotherapy.   03/26/2022 - 04/11/2022 Radiation Therapy   9 fractions totaling 16.2 Gy prior to relocation to [place]    10/20/2022 PET scan   1. Multifocal hypermetabolic skeletal metastasis involving the sternum, T6 vertebral body, T5 and T7 posterior elements, and inferior RIGHT scapula. 2. Hypermetabolic LEFT internal mammary lymph node most consistent with metastatic adenopathy. 3. Postsurgical change in the RIGHT and LEFT breast. No residual carcinoma identified in the breasts or axilla by FDG PET imaging.     10/23/2022 - 11/05/2022 Radiation Therapy   Plan Name: Chest Site: Thorax including T5-7 and sternum Technique: 3D Mode: Photon Dose Per Fraction: 3 Gy Prescribed Dose (Delivered /  Prescribed): 30 Gy / 30 Gy Prescribed Fxs (Delivered / Prescribed): 10 / 10   11/05/2022 -  Anti-estrogen oral therapy   Anastrozole  daily   12/16/2022 Imaging   IMPRESSION: 1. Osseous metastases T3 through T7 levels as above. Mild chronic compression deformity of T3, possibly pathologic. No extra osseous or epidural extension of tumor. 2. Mild multilevel degenerative spondylosis as above. No significant stenosis or overt neural impingement.     01/16/2023 Cancer Staging   Staging form:  Breast, AJCC 8th Edition - Clinical: Stage IV (cM1) - Signed by Crawford Morna Pickle, NP on 01/16/2023     CURRENT THERAPY: Anastrozole   INTERVAL HISTORY:  Tracie Atkins 68 y.o. female returns for f/u of her metastatic breast cancer.     Rest of the pertinent 10 point ROS reviewed and negative  Patient Active Problem List   Diagnosis Date Noted   MDD (major depressive disorder), recurrent severe, without psychosis (HCC) 04/21/2024   Cocaine abuse (HCC) 04/20/2024   Metastasis to bone (HCC) 01/16/2023   Vitamin D deficiency 01/16/2023   Seasonal allergies 01/16/2023   Obesity (BMI 30.0-34.9) 01/16/2023   Hyperlipidemia 01/16/2023   Essential hypertension 01/16/2023   Alcohol use disorder, severe, dependence (HCC) 11/07/2022   Pituitary mass 07/31/2022   Malignant neoplasm of upper-outer quadrant of left breast in female, estrogen receptor positive (HCC) 12/05/2021   Severe episode of recurrent major depressive disorder, without psychotic features (HCC) 08/19/2021   Suicidal ideation    Alcohol abuse with alcohol-induced mood disorder (HCC) 11/20/2016    has no known allergies.  MEDICAL HISTORY: Past Medical History:  Diagnosis Date   Breast cancer (HCC) 2023   left   Chronic dental infection 8/189   multiple teeth removed and Post -op infection.   Depression    ETOH abuse    Fatty liver    Headache     SURGICAL HISTORY: Past Surgical History:  Procedure Laterality Date   BREAST LUMPECTOMY WITH RADIOACTIVE SEED AND SENTINEL LYMPH NODE BIOPSY Left 01/15/2022   Procedure: LEFT BREAST SEED LOCALIZED LUMPECTOMY, LEFT SEED TARGETED AXILLARY LYMPH NODE BIOPSY, LEFT SENTINEL LYMPH NODE BIOPSY;  Surgeon: Aron Shoulders, MD;  Location: [facility] OR;  Service: General;  Laterality: Left;   DENTAL SURGERY     RADIOACTIVE SEED GUIDED EXCISIONAL BREAST BIOPSY Right 01/15/2022   Procedure: RIGHT BREAST SEED LOCALIZED EXCISIONAL BIOPSY;  Surgeon: Aron Shoulders, MD;  Location: [facility] OR;   Service: General;  Laterality: Right;   TUBAL LIGATION      SOCIAL HISTORY: Social History   Socioeconomic History   Marital status: Single    Spouse name: Not on file   Number of children: 3   Years of education: Not on file   Highest education level: Not on file  Occupational History   Not on file  Tobacco Use   Smoking status: Never    Passive exposure: Never   Smokeless tobacco: Never  Vaping Use   Vaping status: Never Used  Substance and Sexual Activity   Alcohol use: Yes    Alcohol/week: 1.0 standard drink of alcohol    Types: 1 Standard drinks or equivalent per week    Comment: 1 mixed (liquor) drink per week   Drug use: No   Sexual activity: Never  Other Topics Concern   Not on file  Social History Narrative   Not on file   Social Drivers of Health   Financial Resource Strain: Not on file  Food Insecurity: No Food Insecurity (04/21/2024)  Hunger Vital Sign    Worried About Running Out of Food in the Last Year: Never true    Ran Out of Food in the Last Year: Never true  Transportation Needs: No Transportation Needs (04/21/2024)   PRAPARE - Administrator, Civil Service (Medical): No    Lack of Transportation (Non-Medical): No  Physical Activity: Not on file  Stress: Not on file  Social Connections: Patient Declined (04/21/2024)   Social Connection and Isolation Panel    Frequency of Communication with Friends and Family: Patient declined    Frequency of Social Gatherings with Friends and Family: Patient declined    Attends Religious Services: Patient declined    Database Administrator or Organizations: Patient declined    Attends Banker Meetings: Patient declined    Marital Status: Patient declined  Intimate Partner Violence: Patient Declined (04/21/2024)   Humiliation, Afraid, Rape, and Kick questionnaire    Fear of Current or Ex-Partner: Patient declined    Emotionally Abused: Patient declined    Physically Abused: Patient  declined    Sexually Abused: Patient declined    FAMILY HISTORY: No family history on file.  Review of Systems  Constitutional:  Negative for appetite change, chills, fatigue, fever and unexpected weight change.  HENT:   Negative for hearing loss, lump/mass and trouble swallowing.   Eyes:  Negative for eye problems and icterus.  Respiratory:  Negative for chest tightness, cough and shortness of breath.   Cardiovascular:  Negative for chest pain, leg swelling and palpitations.  Gastrointestinal:  Negative for abdominal distention, abdominal pain, constipation, diarrhea, nausea and vomiting.  Endocrine: Negative for hot flashes.  Genitourinary:  Negative for difficulty urinating.   Musculoskeletal:  Negative for arthralgias.  Skin:  Negative for itching and rash.  Neurological:  Negative for dizziness, extremity weakness, headaches and numbness.  Hematological:  Negative for adenopathy. Does not bruise/bleed easily.  Psychiatric/Behavioral:  Negative for depression and suicidal ideas. The patient is not nervous/anxious.       PHYSICAL EXAMINATION   Vitals:   06/08/24 1432  BP: 120/78  Pulse: (!) 111  Resp: 18  Temp: 97.8 F (36.6 C)  SpO2: 100%       Physical Exam Constitutional:      General: She is not in acute distress.    Appearance: Normal appearance. She is not toxic-appearing.  HENT:     Head: Normocephalic and atraumatic.     Mouth/Throat:     Mouth: Mucous membranes are moist.     Pharynx: Oropharynx is clear. No oropharyngeal exudate or posterior oropharyngeal erythema.  Eyes:     General: No scleral icterus. Cardiovascular:     Rate and Rhythm: Normal rate and regular rhythm.     Pulses: Normal pulses.     Heart sounds: Normal heart sounds.  Pulmonary:     Effort: Pulmonary effort is normal.     Breath sounds: Normal breath sounds.  Abdominal:     General: Abdomen is flat. Bowel sounds are normal. There is no distension.     Palpations: Abdomen is  soft.     Tenderness: There is no abdominal tenderness.  Musculoskeletal:        General: No swelling.     Cervical back: Neck supple.  Lymphadenopathy:     Cervical: No cervical adenopathy.  Skin:    General: Skin is warm and dry.     Findings: No rash.  Neurological:     General: No focal deficit  present.     Mental Status: She is alert.  Psychiatric:        Mood and Affect: Mood normal.        Behavior: Behavior normal.     LABORATORY DATA:  CBC    Component Value Date/Time   WBC 4.9 04/20/2024 1158   RBC 3.97 04/20/2024 1158   HGB 9.8 (L) 04/20/2024 1158   HCT 31.9 (L) 04/20/2024 1158   PLT 227 04/20/2024 1158   MCV 80.4 04/20/2024 1158   MCH 24.7 (L) 04/20/2024 1158   MCHC 30.7 04/20/2024 1158   RDW 18.0 (H) 04/20/2024 1158   LYMPHSABS 1.0 04/17/2024 1635   MONOABS 0.6 04/17/2024 1635   EOSABS 0.1 04/17/2024 1635   BASOSABS 0.0 04/17/2024 1635    CMP     Component Value Date/Time   NA 144 04/20/2024 1158   K 3.6 04/20/2024 1158   CL 109 04/20/2024 1158   CO2 23 04/20/2024 1158   GLUCOSE 93 04/20/2024 1158   BUN 14 04/20/2024 1158   CREATININE 0.76 04/20/2024 1158   CREATININE 0.68 07/27/2023 1103   CALCIUM 8.4 (L) 04/20/2024 1158   PROT 6.0 (L) 04/20/2024 1158   ALBUMIN 3.2 (L) 04/20/2024 1158   AST 20 04/20/2024 1158   AST 15 07/27/2023 1103   ALT 15 04/20/2024 1158   ALT 12 07/27/2023 1103   ALKPHOS 135 (H) 04/20/2024 1158   BILITOT 0.3 04/20/2024 1158   BILITOT 0.3 07/27/2023 1103   GFRNONAA >60 04/20/2024 1158   GFRNONAA >60 07/27/2023 1103   GFRAA >60 06/12/2018 1323    ASSESSMENT and THERAPY PLAN:   No problem-specific Assessment & Plan notes found for this encounter.   All questions were answered. The patient knows to call the clinic with any problems, questions or concerns. We can certainly see the patient much sooner if necessary.  Total encounter time:30 minutes*in face-to-face visit time, chart review, lab review, care  coordination, order entry, and documentation of the encounter time.   *Total Encounter Time as defined by the Centers for Medicare and Medicaid Services includes, in addition to the face-to-face time of a patient visit (documented in the note above) non-face-to-face time: obtaining and reviewing outside history, ordering and reviewing medications, tests or procedures, care coordination (communications with other health care professionals or caregivers) and documentation in the medical record.

## 2024-06-08 NOTE — Assessment & Plan Note (Addendum)
 Metastatic breast cancer On single agent anastrozole  based on SONIA trial. No CDK 4/6 inhibition planned unless first progression because of non compliance issues, drug use, psychiatric illness. She has been tolerating this well, last imaging with no def evidence for progression. - Continue anastrozole . - Repeat imaging ordered. - Tumor markers of no yield, they were neg last yr. - No concern on exam today  RTC in 5 weeks for follow up and to review imaging

## 2024-06-08 NOTE — Progress Notes (Signed)
 [facility] Cancer Follow up:    System, Provider Not In No address on file   DIAGNOSIS:  Cancer Staging  Malignant neoplasm of upper-outer quadrant of left breast in female, estrogen receptor positive (HCC) Staging form: Breast, AJCC 8th Edition - Clinical: Stage IIA (cT2, cN1, cM0, G2, ER+, PR+, HER2-) - Signed by Odean Potts, MD on 12/05/2021 Stage prefix: Initial diagnosis Histologic grading system: 3 grade system - Clinical: Stage IV (cM1) - Signed by Crawford Morna Pickle, NP on 01/16/2023   SUMMARY OF ONCOLOGIC HISTORY: Oncology History  Malignant neoplasm of upper-outer quadrant of left breast in female, estrogen receptor positive (HCC)  11/05/2021 Initial Diagnosis   Palpable lump in the left breast with the pain along with right-sided nipple discharge.  Mammogram reveals highly suspicious mass left breast 3 o'clock position 2.2 cm, 2 abnormal left axillary lymph nodes: Biopsy: Grade 2 IDC ER 100%, PR 0%, Ki-67 10%, HER2 2+ by IHC, FISH negative ratio 1.4, axillary lymph node biopsy: IDC, ER 100%, PR 20%, Ki-67 10%, HER2 negative ratio 1.31   11/28/2021 Breast MRI   Left breast cancer 2.7 cm and 0.7 cm linear enhancement UOQ left breast needs biopsy, 0.5 cm mass posterior to the right nipple needs biopsy, 2-lymph nodes left axilla, 1.5 cm sternal lesion   12/05/2021 Cancer Staging   Staging form: Breast, AJCC 8th Edition - Clinical: Stage IIA (cT2, cN1, cM0, G2, ER+, PR+, HER2-) - Signed by Odean Potts, MD on 12/05/2021 Stage prefix: Initial diagnosis Histologic grading system: 3 grade system   01/15/2022 Pathology Results   SURGICAL PATHOLOGY  CASE: [id number]  PATIENT: [name]  Surgical Pathology Report      Clinical History: left breast cancer (cm)   FINAL MICROSCOPIC DIAGNOSIS:   A. BREAST, LEFT, LUMPECTOMY:  Invasive ductal carcinoma with clear margins of resection.  Please see the synoptic report after specimen D.   B. BREAST, RIGHT,  LUMPECTOMY:  Fibrous scar with hemosiderin deposits.  Carcinoma is not identified in the resected specimen.   C. LYMPH NODE, LEFT AXILLARY #2, SENTINEL, EXCISION:  A lymph node negative for metastatic carcinoma.   D. 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   pTNM classification ( AJCC 8th Edition): pT2, pN1a  Results of prognostic markers performed on prior biopsy from 11/05/2021  ([id number])           ER: Positive in 100% of tumor cells.           PR: Negative.           Ki-67: Positive in 10% of tumor cells.           HER-2/neu: 2+ by IHC/ Negative by FISH.    02/17/2022 Oncotype testing   Oncotype of 11, no benefit from chemotherapy.   03/26/2022 - 04/11/2022 Radiation Therapy   9 fractions totaling 16.2 Gy prior to relocation to [place]    10/20/2022 PET scan   1. Multifocal hypermetabolic skeletal metastasis involving the sternum, T6 vertebral body, T5 and T7 posterior elements, and inferior RIGHT scapula. 2. Hypermetabolic LEFT internal mammary lymph node most consistent with metastatic adenopathy. 3. Postsurgical change in the RIGHT and LEFT breast. No residual carcinoma identified in the breasts or axilla by FDG PET imaging.     10/23/2022 - 11/05/2022 Radiation Therapy   Plan Name: Chest Site: Thorax including T5-7 and sternum Technique: 3D Mode: Photon Dose Per Fraction: 3 Gy Prescribed Dose (Delivered /  Prescribed): 30 Gy / 30 Gy Prescribed Fxs (Delivered / Prescribed): 10 / 10   11/05/2022 -  Anti-estrogen oral therapy   Anastrozole  daily   12/16/2022 Imaging   IMPRESSION: 1. Osseous metastases T3 through T7 levels as above. Mild chronic compression deformity of T3, possibly pathologic. No extra osseous or epidural extension of tumor. 2. Mild multilevel degenerative spondylosis as above. No significant stenosis or overt neural impingement.     01/16/2023 Cancer Staging   Staging form:  Breast, AJCC 8th Edition - Clinical: Stage IV (cM1) - Signed by Crawford Morna Pickle, NP on 01/16/2023     CURRENT THERAPY: Anastrozole   INTERVAL HISTORY:  Tracie Atkins 68 y.o. female returns for f/u of her metastatic breast cancer.   She is on anastrozole  alone for front line met breast cancer based on SONIA trial given her underlying psychiatric illness, drug use and noncompliance. She continues to complain of some back pain and neuropathy in lower extremities.  She had to go to the ED in Oct for suicidal ideation and was noted to have some cocaine in the urine, she says she didn't know she used it.  Rest of the pertinent 10 point ROS reviewed and negative  Patient Active Problem List   Diagnosis Date Noted   MDD (major depressive disorder), recurrent severe, without psychosis (HCC) 04/21/2024   Cocaine abuse (HCC) 04/20/2024   Metastasis to bone (HCC) 01/16/2023   Vitamin D deficiency 01/16/2023   Seasonal allergies 01/16/2023   Obesity (BMI 30.0-34.9) 01/16/2023   Hyperlipidemia 01/16/2023   Essential hypertension 01/16/2023   Alcohol use disorder, severe, dependence (HCC) 11/07/2022   Pituitary mass 07/31/2022   Malignant neoplasm of upper-outer quadrant of left breast in female, estrogen receptor positive (HCC) 12/05/2021   Severe episode of recurrent major depressive disorder, without psychotic features (HCC) 08/19/2021   Suicidal ideation    Alcohol abuse with alcohol-induced mood disorder (HCC) 11/20/2016    has no known allergies.  MEDICAL HISTORY: Past Medical History:  Diagnosis Date   Breast cancer (HCC) 2023   left   Chronic dental infection 8/189   multiple teeth removed and Post -op infection.   Depression    ETOH abuse    Fatty liver    Headache     SURGICAL HISTORY: Past Surgical History:  Procedure Laterality Date   BREAST LUMPECTOMY WITH RADIOACTIVE SEED AND SENTINEL LYMPH NODE BIOPSY Left 01/15/2022   Procedure: LEFT BREAST SEED LOCALIZED  LUMPECTOMY, LEFT SEED TARGETED AXILLARY LYMPH NODE BIOPSY, LEFT SENTINEL LYMPH NODE BIOPSY;  Surgeon: Aron Shoulders, MD;  Location: [facility] OR;  Service: General;  Laterality: Left;   DENTAL SURGERY     RADIOACTIVE SEED GUIDED EXCISIONAL BREAST BIOPSY Right 01/15/2022   Procedure: RIGHT BREAST SEED LOCALIZED EXCISIONAL BIOPSY;  Surgeon: Aron Shoulders, MD;  Location: [facility] OR;  Service: General;  Laterality: Right;   TUBAL LIGATION      SOCIAL HISTORY: Social History   Socioeconomic History   Marital status: Single    Spouse name: Not on file   Number of children: 3   Years of education: Not on file   Highest education level: Not on file  Occupational History   Not on file  Tobacco Use   Smoking status: Never    Passive exposure: Never   Smokeless tobacco: Never  Vaping Use   Vaping status: Never Used  Substance and Sexual Activity   Alcohol use: Yes    Alcohol/week: 1.0 standard drink of alcohol    Types:  1 Standard drinks or equivalent per week    Comment: 1 mixed (liquor) drink per week   Drug use: No   Sexual activity: Never  Other Topics Concern   Not on file  Social History Narrative   Not on file   Social Drivers of Health   Financial Resource Strain: Not on file  Food Insecurity: No Food Insecurity (04/21/2024)   Hunger Vital Sign    Worried About Running Out of Food in the Last Year: Never true    Ran Out of Food in the Last Year: Never true  Transportation Needs: No Transportation Needs (04/21/2024)   PRAPARE - Administrator, Civil Service (Medical): No    Lack of Transportation (Non-Medical): No  Physical Activity: Not on file  Stress: Not on file  Social Connections: Patient Declined (04/21/2024)   Social Connection and Isolation Panel    Frequency of Communication with Friends and Family: Patient declined    Frequency of Social Gatherings with Friends and Family: Patient declined    Attends Religious Services: Patient declined    Doctor, General Practice or Organizations: Patient declined    Attends Banker Meetings: Patient declined    Marital Status: Patient declined  Intimate Partner Violence: Patient Declined (04/21/2024)   Humiliation, Afraid, Rape, and Kick questionnaire    Fear of Current or Ex-Partner: Patient declined    Emotionally Abused: Patient declined    Physically Abused: Patient declined    Sexually Abused: Patient declined    FAMILY HISTORY: No family history on file.    PHYSICAL EXAMINATION   Vitals:   06/08/24 1432  BP: 120/78  Pulse: (!) 111  Resp: 18  Temp: 97.8 F (36.6 C)  SpO2: 100%       Physical Exam Constitutional:      General: She is not in acute distress.    Appearance: Normal appearance. She is not toxic-appearing.  HENT:     Head: Normocephalic and atraumatic.     Mouth/Throat:     Mouth: Mucous membranes are moist.     Pharynx: Oropharynx is clear. No oropharyngeal exudate or posterior oropharyngeal erythema.  Eyes:     General: No scleral icterus. Cardiovascular:     Rate and Rhythm: Normal rate and regular rhythm.     Pulses: Normal pulses.     Heart sounds: Normal heart sounds.  Pulmonary:     Effort: Pulmonary effort is normal.     Breath sounds: Normal breath sounds.  Chest:     Comments: Bilateral breasts examined. No palpable mass. No regional adenopathy Abdominal:     General: Abdomen is flat. Bowel sounds are normal. There is no distension.     Palpations: Abdomen is soft.     Tenderness: There is no abdominal tenderness.  Musculoskeletal:        General: No swelling.     Cervical back: Neck supple.  Lymphadenopathy:     Cervical: No cervical adenopathy.  Skin:    General: Skin is warm and dry.     Findings: No rash.  Neurological:     General: No focal deficit present.     Mental Status: She is alert.  Psychiatric:        Mood and Affect: Mood normal.        Behavior: Behavior normal.     LABORATORY DATA:  CBC    Component Value  Date/Time   WBC 4.9 04/20/2024 1158   RBC 3.97 04/20/2024 1158  HGB 9.8 (L) 04/20/2024 1158   HCT 31.9 (L) 04/20/2024 1158   PLT 227 04/20/2024 1158   MCV 80.4 04/20/2024 1158   MCH 24.7 (L) 04/20/2024 1158   MCHC 30.7 04/20/2024 1158   RDW 18.0 (H) 04/20/2024 1158   LYMPHSABS 1.0 04/17/2024 1635   MONOABS 0.6 04/17/2024 1635   EOSABS 0.1 04/17/2024 1635   BASOSABS 0.0 04/17/2024 1635    CMP     Component Value Date/Time   NA 144 04/20/2024 1158   K 3.6 04/20/2024 1158   CL 109 04/20/2024 1158   CO2 23 04/20/2024 1158   GLUCOSE 93 04/20/2024 1158   BUN 14 04/20/2024 1158   CREATININE 0.76 04/20/2024 1158   CREATININE 0.68 07/27/2023 1103   CALCIUM 8.4 (L) 04/20/2024 1158   PROT 6.0 (L) 04/20/2024 1158   ALBUMIN 3.2 (L) 04/20/2024 1158   AST 20 04/20/2024 1158   AST 15 07/27/2023 1103   ALT 15 04/20/2024 1158   ALT 12 07/27/2023 1103   ALKPHOS 135 (H) 04/20/2024 1158   BILITOT 0.3 04/20/2024 1158   BILITOT 0.3 07/27/2023 1103   GFRNONAA >60 04/20/2024 1158   GFRNONAA >60 07/27/2023 1103   GFRAA >60 06/12/2018 1323    ASSESSMENT and THERAPY PLAN:   Malignant neoplasm of upper-outer quadrant of left breast in female, estrogen receptor positive (HCC) Metastatic breast cancer On single agent anastrozole  based on SONIA trial. No CDK 4/6 inhibition planned unless first progression because of non compliance issues, drug use, psychiatric illness. She has been tolerating this well, last imaging with no def evidence for progression. - Continue anastrozole . - Repeat imaging ordered. - Tumor markers of no yield, they were neg last yr. - No concern on exam today  RTC in 5 weeks for follow up and to review imaging   All questions were answered. The patient knows to call the clinic with any problems, questions or concerns. We can certainly see the patient much sooner if necessary.  Total encounter time:30 minutes*in face-to-face visit time, chart review, lab review, care  coordination, order entry, and documentation of the encounter time.   *Total Encounter Time as defined by the Centers for Medicare and Medicaid Services includes, in addition to the face-to-face time of a patient visit (documented in the note above) non-face-to-face time: obtaining and reviewing outside history, ordering and reviewing medications, tests or procedures, care coordination (communications with other health care professionals or caregivers) and documentation in the medical record.

## 2024-06-10 ENCOUNTER — Telehealth: Payer: Self-pay

## 2024-06-10 NOTE — Telephone Encounter (Signed)
 Patient called to update phone number. Chart updated with new phone number as requested.

## 2024-06-16 ENCOUNTER — Emergency Department (HOSPITAL_COMMUNITY)

## 2024-06-16 ENCOUNTER — Emergency Department (HOSPITAL_COMMUNITY)
Admission: EM | Admit: 2024-06-16 | Discharge: 2024-06-16 | Disposition: A | Discharge status: Home / Self Care | Attending: Emergency Medicine | Admitting: Emergency Medicine

## 2024-06-16 DIAGNOSIS — C799 Secondary malignant neoplasm of unspecified site: Secondary | ICD-10-CM | POA: Diagnosis not present

## 2024-06-16 DIAGNOSIS — Z79899 Other long term (current) drug therapy: Secondary | ICD-10-CM | POA: Diagnosis not present

## 2024-06-16 DIAGNOSIS — R0600 Dyspnea, unspecified: Secondary | ICD-10-CM | POA: Insufficient documentation

## 2024-06-16 DIAGNOSIS — I10 Essential (primary) hypertension: Secondary | ICD-10-CM | POA: Insufficient documentation

## 2024-06-16 DIAGNOSIS — Z853 Personal history of malignant neoplasm of breast: Secondary | ICD-10-CM | POA: Diagnosis not present

## 2024-06-16 DIAGNOSIS — G43809 Other migraine, not intractable, without status migrainosus: Secondary | ICD-10-CM | POA: Diagnosis present

## 2024-06-16 LAB — CBC WITH DIFFERENTIAL/PLATELET
Abs Immature Granulocytes: 0.01 K/uL (ref 0.00–0.07)
Basophils Absolute: 0 K/uL (ref 0.0–0.1)
Basophils Relative: 1 %
Eosinophils Absolute: 0.1 K/uL (ref 0.0–0.5)
Eosinophils Relative: 2 %
HCT: 35 % — ABNORMAL LOW (ref 36.0–46.0)
Hemoglobin: 10.6 g/dL — ABNORMAL LOW (ref 12.0–15.0)
Immature Granulocytes: 0 %
Lymphocytes Relative: 12 %
Lymphs Abs: 0.7 K/uL (ref 0.7–4.0)
MCH: 22.7 pg — ABNORMAL LOW (ref 26.0–34.0)
MCHC: 30.3 g/dL (ref 30.0–36.0)
MCV: 75.1 fL — ABNORMAL LOW (ref 80.0–100.0)
Monocytes Absolute: 0.6 K/uL (ref 0.1–1.0)
Monocytes Relative: 10 %
Neutro Abs: 4.4 K/uL (ref 1.7–7.7)
Neutrophils Relative %: 75 %
Platelets: 276 K/uL (ref 150–400)
RBC: 4.66 MIL/uL (ref 3.87–5.11)
RDW: 16.5 % — ABNORMAL HIGH (ref 11.5–15.5)
WBC: 5.8 K/uL (ref 4.0–10.5)
nRBC: 0 % (ref 0.0–0.2)

## 2024-06-16 LAB — COMPREHENSIVE METABOLIC PANEL WITH GFR
ALT: 19 U/L (ref 0–44)
AST: 24 U/L (ref 15–41)
Albumin: 4 g/dL (ref 3.5–5.0)
Alkaline Phosphatase: 177 U/L — ABNORMAL HIGH (ref 38–126)
Anion gap: 11 (ref 5–15)
BUN: 13 mg/dL (ref 8–23)
CO2: 23 mmol/L (ref 22–32)
Calcium: 9.1 mg/dL (ref 8.9–10.3)
Chloride: 104 mmol/L (ref 98–111)
Creatinine, Ser: 0.66 mg/dL (ref 0.44–1.00)
GFR, Estimated: >60 mL/min (ref >60)
Glucose, Bld: 115 mg/dL — ABNORMAL HIGH (ref 70–99)
Potassium: 3.9 mmol/L (ref 3.5–5.1)
Sodium: 138 mmol/L (ref 135–145)
Total Bilirubin: 0.3 mg/dL (ref 0.0–1.2)
Total Protein: 6.9 g/dL (ref 6.5–8.1)

## 2024-06-16 LAB — I-STAT CHEM 8, ED
BUN: 13 mg/dL (ref 8–23)
Calcium, Ion: 1.17 mmol/L (ref 1.15–1.40)
Chloride: 105 mmol/L (ref 98–111)
Creatinine, Ser: 0.7 mg/dL (ref 0.44–1.00)
Glucose, Bld: 115 mg/dL — ABNORMAL HIGH (ref 70–99)
HCT: 35 % — ABNORMAL LOW (ref 36.0–46.0)
Hemoglobin: 11.9 g/dL — ABNORMAL LOW (ref 12.0–15.0)
Potassium: 3.9 mmol/L (ref 3.5–5.1)
Sodium: 139 mmol/L (ref 135–145)
TCO2: 22 mmol/L (ref 22–32)

## 2024-06-16 LAB — TROPONIN T, HIGH SENSITIVITY: Troponin T High Sensitivity: <15 ng/L (ref 0–19)

## 2024-06-16 LAB — MAGNESIUM: Magnesium: 2.3 mg/dL (ref 1.7–2.4)

## 2024-06-16 LAB — PRO BRAIN NATRIURETIC PEPTIDE: Pro Brain Natriuretic Peptide: 340 pg/mL — ABNORMAL HIGH (ref <300.0)

## 2024-06-16 MED ORDER — SODIUM CHLORIDE 0.9 % IV BOLUS: 1000.0000 mL | Freq: Once | INTRAVENOUS | Status: DC

## 2024-06-16 MED ORDER — DEXAMETHASONE 4 MG PO TABS
8.0000 mg | ORAL_TABLET | Freq: Once | ORAL | Status: AC
  Administered 2024-06-16: 8 mg via ORAL
  Filled 2024-06-16: qty 2

## 2024-06-16 MED ORDER — IOHEXOL 350 MG/ML SOLN
80.0000 mL | Freq: Once | INTRAVENOUS | Status: AC | PRN
  Administered 2024-06-16: 80 mL via INTRAVENOUS

## 2024-06-16 MED ORDER — IPRATROPIUM-ALBUTEROL 0.5-2.5 (3) MG/3ML IN SOLN
3.0000 mL | Freq: Once | RESPIRATORY_TRACT | Status: AC
  Administered 2024-06-16: 3 mL via RESPIRATORY_TRACT
  Filled 2024-06-16: qty 3

## 2024-06-16 MED ORDER — METOCLOPRAMIDE HCL 5 MG/ML IJ SOLN
5.0000 mg | Freq: Once | INTRAMUSCULAR | Status: AC
  Administered 2024-06-16: 5 mg via INTRAVENOUS
  Filled 2024-06-16: qty 2

## 2024-06-16 MED ORDER — ALBUTEROL SULFATE HFA 108 (90 BASE) MCG/ACT IN AERS: 1.0000 | INHALATION_SPRAY | Freq: Four times a day (QID) | RESPIRATORY_TRACT | 0 refills | Status: AC | PRN

## 2024-06-16 MED ORDER — SODIUM CHLORIDE 0.9 % IV BOLUS
500.0000 mL | Freq: Once | INTRAVENOUS | Status: AC
  Administered 2024-06-16: 500 mL via INTRAVENOUS

## 2024-06-16 MED ORDER — DIPHENHYDRAMINE HCL 50 MG/ML IJ SOLN
25.0000 mg | Freq: Once | INTRAMUSCULAR | Status: AC
  Administered 2024-06-16: 25 mg via INTRAVENOUS
  Filled 2024-06-16: qty 1

## 2024-06-16 MED ORDER — MAGNESIUM SULFATE 2 GM/50ML IV SOLN
2.0000 g | Freq: Once | INTRAVENOUS | Status: AC
  Administered 2024-06-16: 2 g via INTRAVENOUS
  Filled 2024-06-16: qty 50

## 2024-06-16 NOTE — ED Triage Notes (Signed)
 Pt arrives via [facility] from home c/o HA. Pt states it has been ongoing for several days. Woke her from sleep at 0500. Denies unilateral weakness or vision changes.

## 2024-06-16 NOTE — ED Provider Notes (Signed)
 [place] [facility] AT [facility] Provider Note  CSN: [id number] Arrival date & time: 06/16/24 1045  Chief Complaint(s) Headache and Shortness of Breath  HPI Tracie Atkins is a 68 y.o. female with past medical history as below, significant for depression, alcohol abuse, recurrent headaches, MDD, cocaine abuse, hypertension breast cancer who presents to the ED with complaint of headache, exertional dyspnea  Reports woke up this morning with a headache, also noted her blood pressure was high.  She took Tylenol  and her headache did not improve and she presented to the ER.  No recent falls or head injuries, no numbness or weakness to her extremities, no vision or hearing changes.  Headache described as a stabbing sensation.  She has photophobia and phonophobia.  Reports feels similar to prior headaches.   Patient also reports exertional dyspnea over the past month, seems to be gradually worsening.  Productive cough with clear or white clear sputum.  No fevers or chills, no chest pain.  Denies tobacco use    Past Medical History Past Medical History:  Diagnosis Date   Breast cancer (HCC) 2023   left   Chronic dental infection 8/189   multiple teeth removed and Post -op infection.   Depression    ETOH abuse    Fatty liver    Headache    Patient Active Problem List   Diagnosis Date Noted   MDD (major depressive disorder), recurrent severe, without psychosis (HCC) 04/21/2024   Cocaine abuse (HCC) 04/20/2024   Metastasis to bone (HCC) 01/16/2023   Vitamin D deficiency 01/16/2023   Seasonal allergies 01/16/2023   Obesity (BMI 30.0-34.9) 01/16/2023   Hyperlipidemia 01/16/2023   Essential hypertension 01/16/2023   Alcohol use disorder, severe, dependence (HCC) 11/07/2022   Pituitary mass 07/31/2022   Malignant neoplasm of upper-outer quadrant of left breast in female, estrogen receptor positive (HCC) 12/05/2021   Severe episode of recurrent major depressive  disorder, without psychotic features (HCC) 08/19/2021   Suicidal ideation    Alcohol abuse with alcohol-induced mood disorder (HCC) 11/20/2016   Home Medication(s) Prior to Admission medications  Medication Sig Start Date End Date Taking? Authorizing Provider  albuterol  (VENTOLIN  HFA) 108 (90 Base) MCG/ACT inhaler Inhale 1-2 puffs into the lungs every 6 (six) hours as needed for wheezing or shortness of breath. 06/16/24  Yes Elnor Savant A, DO  metoCLOPramide (REGLAN) 5 MG tablet Take 1 tablet (5 mg total) by mouth every 6 (six) hours as needed (headache). 06/16/24  Yes Elnor Savant A, DO  anastrozole  (ARIMIDEX ) 1 MG tablet Take 1 tablet (1 mg total) by mouth daily. 06/08/24   Iruku, Praveena, MD  escitalopram  (LEXAPRO ) 20 MG tablet Take 1 tablet (20 mg total) by mouth daily. 04/27/24   Donnelly Mellow, MD  folic acid  (FOLVITE ) 1 MG tablet Take 1 tablet (1 mg total) by mouth daily. 04/28/24   Jadapalle, Sree, MD  gabapentin  (NEURONTIN ) 300 MG capsule Take 1 capsule (300 mg total) by mouth 3 (three) times daily. 04/27/24   Donnelly Mellow, MD  lidocaine  (LIDODERM ) 5 % Place 1 patch onto the skin daily. Remove & Discard patch within 12 hours or as directed by MD 03/18/24   Neysa Caron PARAS, DO  meloxicam  (MOBIC ) 7.5 MG tablet Take 1 tablet (7.5 mg total) by mouth daily. 11/02/23   Crawford Morna Pickle, NP  methocarbamol  (ROBAXIN ) 500 MG tablet Take 1 tablet (500 mg total) by mouth 2 (two) times daily. 03/18/24   Neysa Caron PARAS, DO  montelukast  (  SINGULAIR ) 10 MG tablet Take 10 mg by mouth at bedtime. 12/29/22   [provider]  Multiple Vitamin (MULTIVITAMIN WITH MINERALS) TABS tablet Take 1 tablet by mouth daily. 04/28/24   Jadapalle, Sree, MD  ondansetron  (ZOFRAN ) 8 MG tablet Take 1 tablet (8 mg total) by mouth every 8 (eight) hours as needed for nausea or vomiting. 08/03/23   Iruku, Praveena, MD  pantoprazole  (PROTONIX ) 40 MG tablet Take 40 mg by mouth at bedtime. 09/17/22   [provider]  thiamine  (VITAMIN B-1) 100 MG tablet Take 1 tablet (100 mg total) by mouth daily. 04/28/24   Donnelly Mellow, MD                                                                                                                                    Past Surgical History Past Surgical History:  Procedure Laterality Date   BREAST LUMPECTOMY WITH RADIOACTIVE SEED AND SENTINEL LYMPH NODE BIOPSY Left 01/15/2022   Procedure: LEFT BREAST SEED LOCALIZED LUMPECTOMY, LEFT SEED TARGETED AXILLARY LYMPH NODE BIOPSY, LEFT SENTINEL LYMPH NODE BIOPSY;  Surgeon: Aron Shoulders, MD;  Location: [facility] OR;  Service: General;  Laterality: Left;   DENTAL SURGERY     RADIOACTIVE SEED GUIDED EXCISIONAL BREAST BIOPSY Right 01/15/2022   Procedure: RIGHT BREAST SEED LOCALIZED EXCISIONAL BIOPSY;  Surgeon: Aron Shoulders, MD;  Location: [facility] OR;  Service: General;  Laterality: Right;   TUBAL LIGATION     Family History No family history on file.  Social History Social History[1] Allergies Patient has no known allergies.  Review of Systems A thorough review of systems was obtained and all systems are negative except as noted in the HPI and PMH.   Physical Exam Vital Signs  I have reviewed the triage vital signs BP (!) 151/93 (BP Location: Left Arm)   Pulse 99   Temp 98.3 F (36.8 C) (Oral)   Resp (!) 23   SpO2 96%  Physical Exam Vitals and nursing note reviewed.  Constitutional:      General: She is not in acute distress.    Appearance: Normal appearance.  HENT:     Head: Normocephalic and atraumatic.     Right Ear: External ear normal.     Left Ear: External ear normal.     Nose: Nose normal.     Mouth/Throat:     Mouth: Mucous membranes are moist.  Eyes:     General: No scleral icterus.       Right eye: No discharge.        Left eye: No discharge.     Extraocular Movements: Extraocular movements intact.     Pupils: Pupils are equal, round, and reactive to light.  Cardiovascular:     Rate and  Rhythm: Normal rate and regular rhythm.     Pulses: Normal pulses.     Heart sounds: Normal heart sounds.  Pulmonary:     Effort: Pulmonary  effort is normal. No respiratory distress.     Breath sounds: Normal breath sounds. No stridor.  Abdominal:     General: Abdomen is flat. There is no distension.     Palpations: Abdomen is soft.     Tenderness: There is no abdominal tenderness.  Musculoskeletal:     Cervical back: No rigidity.     Right lower leg: No edema.     Left lower leg: No edema.  Skin:    General: Skin is warm and dry.     Capillary Refill: Capillary refill takes less than 2 seconds.  Neurological:     Mental Status: She is alert and oriented to person, place, and time.     GCS: GCS eye subscore is 4. GCS verbal subscore is 5. GCS motor subscore is 6.     Cranial Nerves: Cranial nerves 2-12 are intact. No dysarthria.     Sensory: Sensation is intact.     Motor: Motor function is intact. No tremor.     Coordination: Coordination is intact. Coordination normal.     Gait: Gait is intact.     Comments: Strength 5/5 to BLUE/BLLE, equal and symmetric    Psychiatric:        Mood and Affect: Mood normal.        Behavior: Behavior normal. Behavior is cooperative.     ED Results and Treatments Labs (all labs ordered are listed, but only abnormal results are displayed) Labs Reviewed  CBC WITH DIFFERENTIAL/PLATELET - Abnormal; Notable for the following components:      Result Value   Hemoglobin 10.6 (*)    HCT 35.0 (*)    MCV 75.1 (*)    MCH 22.7 (*)    RDW 16.5 (*)    All other components within normal limits  COMPREHENSIVE METABOLIC PANEL WITH GFR - Abnormal; Notable for the following components:   Glucose, Bld 115 (*)    Alkaline Phosphatase 177 (*)    All other components within normal limits  PRO BRAIN NATRIURETIC PEPTIDE - Abnormal; Notable for the following components:   Pro Brain Natriuretic Peptide 340.0 (*)    All other components within normal limits   I-STAT CHEM 8, ED - Abnormal; Notable for the following components:   Glucose, Bld 115 (*)    Hemoglobin 11.9 (*)    HCT 35.0 (*)    All other components within normal limits  RESP PANEL BY RT-PCR (RSV, FLU A&B, COVID)  RVPGX2  MAGNESIUM   TROPONIN T, HIGH SENSITIVITY                                                                                                                          Radiology CT Angio Chest PE W and/or Wo Contrast Result Date: 06/16/2024 CLINICAL DATA:  Suspect pulmonary embolism.  Worsening headache. EXAM: CT ANGIOGRAPHY CHEST WITH CONTRAST TECHNIQUE: Multidetector CT imaging of the chest was performed using the standard protocol during bolus administration of intravenous contrast. Multiplanar CT image  reconstructions and MIPs were obtained to evaluate the vascular anatomy. RADIATION DOSE REDUCTION: This exam was performed according to the departmental dose-optimization program which includes automated exposure control, adjustment of the mA and/or kV according to patient size and/or use of iterative reconstruction technique. CONTRAST:  80mL OMNIPAQUE  IOHEXOL  350 MG/ML SOLN COMPARISON:  03/18/2024 FINDINGS: Cardiovascular: Stable cardiomegaly. Minimal calcified plaque over the left anterior descending coronary artery. Ascending thoracic aorta measures 3.9 cm and is unchanged. Minimal calcified plaque over the descending thoracic aorta. Pulmonary arterial system is well opacified without evidence of emboli. Remaining vascular structures are unremarkable. Mediastinum/Nodes: No mediastinal or hilar adenopathy. Large hiatal hernia unchanged. Lungs/Pleura: Lungs are adequately inflated with minimal bibasilar atelectatic change. No effusion. Airways are unremarkable. Upper Abdomen: Limited images to the upper abdomen demonstrate no acute findings. There is calcified plaque over the abdominal aorta. Musculoskeletal: Stable metastatic disease over the thoracic spine. Stable mild loss of  vertebral body height of an upper thoracic vertebral body. O fracture of the midportion of the body of the sternum. Multiple old bilateral rib fractures. Review of the MIP images confirms the above findings. IMPRESSION: 1. No acute cardiopulmonary disease and no evidence of pulmonary embolism. 2. Stable cardiomegaly. 3. Stable large hiatal hernia. 4. Stable metastatic disease over the thoracic spine. 5. Aortic atherosclerosis. Atherosclerotic coronary artery disease. Aortic Atherosclerosis (ICD10-I70.0). Electronically Signed   By: Toribio Agreste M.D.   On: 06/16/2024 14:34   CT Head Wo Contrast Result Date: 06/16/2024 CLINICAL DATA:  Headache with increasing severity. EXAM: CT HEAD WITHOUT CONTRAST TECHNIQUE: Contiguous axial images were obtained from the base of the skull through the vertex without intravenous contrast. RADIATION DOSE REDUCTION: This exam was performed according to the departmental dose-optimization program which includes automated exposure control, adjustment of the mA and/or kV according to patient size and/or use of iterative reconstruction technique. COMPARISON:  03/18/2024 FINDINGS: Brain: No evidence of intracranial hemorrhage, acute infarction, hydrocephalus, extra-axial collection, or mass lesion/mass effect. Moderate chronic small vessel disease shows no significant change. Vascular:  No hyperdense vessel or other acute findings. Skull: No evidence of fracture or other significant bone abnormality. Sinuses/Orbits:  No acute findings. Other: None. IMPRESSION: No acute intracranial abnormality. Stable moderate chronic small vessel disease. Electronically Signed   By: Norleen DELENA Kil M.D.   On: 06/16/2024 13:47   DG Chest Portable 1 View Result Date: 06/16/2024 EXAM: XR Chest, 1 View(s). CLINICAL HISTORY: cough COMPARISON: 03/18/2024 FINDINGS: LUNGS: The lungs are clear. No consolidation. PLEURAL SPACES: No pleural effusion or pneumothorax. HEART: The heart size is normal. Stable  cardiomediastinal silhouette. BONES: No acute osseous abnormality. Stable hiatal hernia. IMPRESSION: 1. No acute cardiopulmonary process. Electronically signed by: Lynwood Seip MD 06/16/2024 12:14 PM EST RP Workstation: [id number]    Pertinent labs & imaging results that were available during my care of the patient were reviewed by me and considered in my medical decision making (see MDM for details).  Medications Ordered in ED Medications  dexamethasone  (DECADRON ) tablet 8 mg (has no administration in time range)  metoCLOPramide (REGLAN) injection 5 mg (5 mg Intravenous Given 06/16/24 1203)  diphenhydrAMINE  (BENADRYL ) injection 25 mg (25 mg Intravenous Given 06/16/24 1203)  magnesium  sulfate IVPB 2 g 50 mL (0 g Intravenous Stopped 06/16/24 1218)  sodium chloride  0.9 % bolus 500 mL (0 mLs Intravenous Stopped 06/16/24 1244)  ipratropium-albuterol  (DUONEB) 0.5-2.5 (3) MG/3ML nebulizer solution 3 mL (3 mLs Nebulization Given 06/16/24 1250)  iohexol  (OMNIPAQUE ) 350 MG/ML injection 80 mL (80 mLs Intravenous Contrast Given  06/16/24 1320)                                                                                                                                     Procedures Procedures  (including critical care time)  Medical Decision Making / ED Course    Medical Decision Making:    TALLULA GRINDLE is a 68 y.o. female  with past medical history as below, significant for depression, alcohol abuse, recurrent headaches, MDD, cocaine abuse, hypertension breast cancer who presents to the ED with complaint of headache, exertional dyspnea. The complaint involves an extensive differential diagnosis and also carries with it a high risk of complications and morbidity.  Serious etiology was considered. Ddx includes but is not limited to: Differential diagnosis includes but is not exclusive to subarachnoid hemorrhage, meningitis, encephalitis, previous head trauma, cavernous venous thrombosis, muscle  tension headache, glaucoma, temporal arteritis, migraine or migraine equivalent, etc. In my evaluation of this patient's dyspnea my DDx includes, but is not limited to, pneumonia, pulmonary embolism, pneumothorax, pulmonary edema, metabolic acidosis, asthma, COPD, cardiac cause, anemia, anxiety, etc.    Complete initial physical exam performed, notably the patient was in no acute distress, neuro intact, no hypoxia.    Reviewed and confirmed nursing documentation for past medical history, family history, social history.  Vital signs reviewed.    Headache> - Neuro intact, headache feel similar to prior headaches.  Unrelieved with home Tylenol . - She has history of breast cancer, get head CT. - Migraine cocktail > resolved  - give decadron  - CTH stable - neuro intact   Exertional dyspnea> - Ongoing for greater than a month, no hypoxia, denies tobacco use.  Does have a history of lung cancer - CT PE - Lungs clear bilateral - imaging stable, no hypoxia - trop neg - doubt acs, no cp - give albuterol  inhaler for home - f/u pcp  Clinical Course as of 06/16/24 1523  Thu Jun 16, 2024  1254 Hemoglobin(!): 10.6 Similar to prior [SG]  1338 Refused RVP [SG]  1439 CT PE stable [SG]    Clinical Course User Index [SG] Elnor Jayson LABOR, DO     3:23 PM:  I have discussed the diagnosis/risks/treatment options with the patient.  Evaluation and diagnostic testing in the emergency department does not suggest an emergent condition requiring admission or immediate intervention beyond what has been performed at this time.  They will follow up with pcp/oncology. We also discussed returning to the ED immediately if new or worsening sx occur. We discussed the sx which are most concerning (e.g., sudden worsening pain, fever, inability to tolerate by mouth) that necessitate immediate return.    The patient appears reasonably screened and/or stabilized for discharge and I doubt any other medical condition or  other [facility] requiring further screening, evaluation, or treatment in the ED at this time prior to discharge.  Additional history obtained: -Additional history obtained from na -External records from outside source obtained and reviewed including: Chart review including previous notes, labs, imaging, consultation notes including  Home medications Prior er visits    Lab Tests: -I ordered, reviewed, and interpreted labs.   The pertinent results include:   Labs Reviewed  CBC WITH DIFFERENTIAL/PLATELET - Abnormal; Notable for the following components:      Result Value   Hemoglobin 10.6 (*)    HCT 35.0 (*)    MCV 75.1 (*)    MCH 22.7 (*)    RDW 16.5 (*)    All other components within normal limits  COMPREHENSIVE METABOLIC PANEL WITH GFR - Abnormal; Notable for the following components:   Glucose, Bld 115 (*)    Alkaline Phosphatase 177 (*)    All other components within normal limits  PRO BRAIN NATRIURETIC PEPTIDE - Abnormal; Notable for the following components:   Pro Brain Natriuretic Peptide 340.0 (*)    All other components within normal limits  I-STAT CHEM 8, ED - Abnormal; Notable for the following components:   Glucose, Bld 115 (*)    Hemoglobin 11.9 (*)    HCT 35.0 (*)    All other components within normal limits  RESP PANEL BY RT-PCR (RSV, FLU A&B, COVID)  RVPGX2  MAGNESIUM   TROPONIN T, HIGH SENSITIVITY    Notable for labs stable  EKG   EKG Interpretation Date/Time:  Thursday June 16 2024 13:00:16 EST Ventricular Rate:  92 PR Interval:  139 QRS Duration:  79 QT Interval:  392 QTC Calculation: 485 R Axis:   36  Text Interpretation: Sinus rhythm Abnormal R-wave progression, early transition similar to prior Confirmed by Elnor Savant ([id number]) on 06/16/2024 3:21:52 PM         Imaging Studies ordered: I ordered imaging studies including CTH CTPE CXR I independently visualized the following imaging with scope of interpretation  limited to determining acute life threatening conditions related to emergency care; findings noted above I agree with the radiologist interpretation If any imaging was obtained with contrast I closely monitored patient for any possible adverse reaction a/w contrast administration in the emergency department   Medicines ordered and prescription drug management: Meds ordered this encounter  Medications   metoCLOPramide (REGLAN) injection 5 mg   diphenhydrAMINE  (BENADRYL ) injection 25 mg   DISCONTD: sodium chloride  0.9 % bolus 1,000 mL   magnesium  sulfate IVPB 2 g 50 mL   sodium chloride  0.9 % bolus 500 mL   ipratropium-albuterol  (DUONEB) 0.5-2.5 (3) MG/3ML nebulizer solution 3 mL   iohexol  (OMNIPAQUE ) 350 MG/ML injection 80 mL   dexamethasone  (DECADRON ) tablet 8 mg   metoCLOPramide (REGLAN) 5 MG tablet    Sig: Take 1 tablet (5 mg total) by mouth every 6 (six) hours as needed (headache).    Dispense:  6 tablet    Refill:  0   albuterol  (VENTOLIN  HFA) 108 (90 Base) MCG/ACT inhaler    Sig: Inhale 1-2 puffs into the lungs every 6 (six) hours as needed for wheezing or shortness of breath.    Dispense:  1 each    Refill:  0    -I have reviewed the patients home medicines and have made adjustments as needed   Consultations Obtained: na   Cardiac Monitoring: The patient was maintained on a cardiac monitor.  I personally viewed and interpreted the cardiac monitored which showed an underlying rhythm of: NSR Continuous pulse oximetry interpreted by myself, 98% on RA.    Social  Determinants of Health:  Diagnosis or treatment significantly limited by social determinants of health: obesity   Reevaluation: After the interventions noted above, I reevaluated the patient and found that they have improved  Co morbidities that complicate the patient evaluation  Past Medical History:  Diagnosis Date   Breast cancer (HCC) 2023   left   Chronic dental infection 8/189   multiple teeth  removed and Post -op infection.   Depression    ETOH abuse    Fatty liver    Headache       Dispostion: Disposition decision including need for hospitalization was considered, and patient discharged from emergency department.    Final Clinical Impression(s) / ED Diagnoses Final diagnoses:  Other migraine without status migrainosus, not intractable  Metastatic malignant neoplasm, unspecified site (HCC)  Dyspnea, unspecified type         [1]  Social History Tobacco Use   Smoking status: Never    Passive exposure: Never   Smokeless tobacco: Never  Vaping Use   Vaping status: Never Used  Substance Use Topics   Alcohol use: Yes    Alcohol/week: 1.0 standard drink of alcohol    Types: 1 Standard drinks or equivalent per week    Comment: 1 mixed (liquor) drink per week   Drug use: No     Elnor Jayson LABOR, DO 06/16/24 1523

## 2024-06-16 NOTE — ED Notes (Signed)
 Pt did refuse the covid/flu test x 2.  She stated she did not want the test.

## 2024-06-16 NOTE — Discharge Instructions (Addendum)
 It was a pleasure caring for you today in the emergency department.  Please follow-up with PCP in the next week for recheck. Please follow up with your oncologist in the office.   Please return to the emergency department for any worsening or worrisome symptoms.

## 2024-06-23 ENCOUNTER — Inpatient Hospital Stay

## 2024-06-23 ENCOUNTER — Ambulatory Visit (HOSPITAL_COMMUNITY): Admission: RE | Admit: 2024-06-23 | Discharge: 2024-06-23 | Attending: Hematology and Oncology

## 2024-06-23 DIAGNOSIS — Z170 Estrogen receptor positive status [ER+]: Secondary | ICD-10-CM | POA: Insufficient documentation

## 2024-06-23 DIAGNOSIS — C50412 Malignant neoplasm of upper-outer quadrant of left female breast: Secondary | ICD-10-CM | POA: Insufficient documentation

## 2024-06-23 MED ORDER — IOHEXOL 300 MG/ML  SOLN
100.0000 mL | Freq: Once | INTRAMUSCULAR | Status: AC | PRN
  Administered 2024-06-23: 14:00:00 100 mL via INTRAVENOUS

## 2024-07-11 ENCOUNTER — Telehealth: Payer: Self-pay | Admitting: Hematology and Oncology

## 2024-07-11 NOTE — Telephone Encounter (Signed)
 I spoke with patient to cancel appointments. Patient will call back to reschedule.

## 2024-07-13 ENCOUNTER — Inpatient Hospital Stay: Admitting: Hematology and Oncology

## 2024-07-13 ENCOUNTER — Inpatient Hospital Stay: Attending: Internal Medicine

## 2024-07-21 ENCOUNTER — Telehealth: Payer: Self-pay

## 2024-07-21 NOTE — Telephone Encounter (Signed)
 Attempted to contact pt to schedule her an appt but pt did not answer

## 2024-07-21 NOTE — Telephone Encounter (Signed)
 T/C from pt stating she has been having right breast pain for the last 3 days.  It hurts when she touches it or tries to lay on her stomach.   She is also having back pain with occasional numbness. She said it  is worse when active. She said she just knows something is wrong She missed her 1/7 appt and needs to RS. Please advise

## 2024-07-21 NOTE — Telephone Encounter (Signed)
 Per Dr Loretha ok to send in Rxs for Cipro 500 mg BID for 7 days and Flagyl 500 mg BID for 7 days to [facility]. Pt advised with VU

## 2024-07-21 NOTE — Telephone Encounter (Signed)
 Prescriptions were not sent for this pt Documented in error

## 2024-07-22 ENCOUNTER — Telehealth: Payer: Self-pay | Admitting: Hematology and Oncology

## 2024-07-22 NOTE — Telephone Encounter (Signed)
 I spoke with patient and she is aware of 07/27/24 MD appointment at 2:30 pm.

## 2024-07-27 ENCOUNTER — Inpatient Hospital Stay: Attending: Internal Medicine | Admitting: Hematology and Oncology
